# Patient Record
Sex: Male | Born: 1960 | Race: Black or African American | Hispanic: No | Marital: Married | Smoking: Never smoker
Health system: Southern US, Community
[De-identification: ages and names within clinical notes are randomized; demographics above are authoritative.]

## PROBLEM LIST (undated history)

## (undated) DIAGNOSIS — I10 Essential (primary) hypertension: Secondary | ICD-10-CM

## (undated) DIAGNOSIS — I509 Heart failure, unspecified: Secondary | ICD-10-CM

## (undated) DIAGNOSIS — I2510 Atherosclerotic heart disease of native coronary artery without angina pectoris: Secondary | ICD-10-CM

## (undated) DIAGNOSIS — M1990 Unspecified osteoarthritis, unspecified site: Secondary | ICD-10-CM

## (undated) DIAGNOSIS — E7800 Pure hypercholesterolemia, unspecified: Secondary | ICD-10-CM

## (undated) DIAGNOSIS — Z9109 Other allergy status, other than to drugs and biological substances: Secondary | ICD-10-CM

---

## 2005-10-02 ENCOUNTER — Ambulatory Visit: Payer: Self-pay

## 2005-10-24 ENCOUNTER — Ambulatory Visit: Payer: Self-pay | Admitting: Cardiology

## 2012-01-22 ENCOUNTER — Ambulatory Visit (INDEPENDENT_AMBULATORY_CARE_PROVIDER_SITE_OTHER): Payer: BC Managed Care – PPO | Admitting: Emergency Medicine

## 2012-01-22 VITALS — BP 120/80 | HR 70 | Temp 99.2°F | Resp 18 | Ht 65.0 in | Wt 182.0 lb

## 2012-01-22 DIAGNOSIS — J111 Influenza due to unidentified influenza virus with other respiratory manifestations: Secondary | ICD-10-CM

## 2012-01-22 DIAGNOSIS — J029 Acute pharyngitis, unspecified: Secondary | ICD-10-CM

## 2012-01-22 DIAGNOSIS — J101 Influenza due to other identified influenza virus with other respiratory manifestations: Secondary | ICD-10-CM

## 2012-01-22 DIAGNOSIS — R05 Cough: Secondary | ICD-10-CM

## 2012-01-22 LAB — POCT INFLUENZA A/B: Influenza B, POC: POSITIVE

## 2012-01-22 LAB — POCT RAPID STREP A (OFFICE): Rapid Strep A Screen: NEGATIVE

## 2012-01-22 MED ORDER — OSELTAMIVIR PHOSPHATE 75 MG PO CAPS: 75.0000 mg | ORAL_CAPSULE | Freq: Two times a day (BID) | ORAL | Status: AC

## 2012-01-22 NOTE — Patient Instructions (Signed)
Influenza, Adult  Influenza (flu) is an infection caused by a germ. It starts suddenly, usually with a fever. It causes chills, dry and hacking cough, headache, body aches, and sore throat. Influenza spreads easily from one person to another.    HOME CARE     Only take medicines as told by your doctor.    Rest.    Drink enough fluids to keep your pee (urine) clear or pale yellow.    Wash your hands often. Do this after you blow your nose, after you go to the bathroom, and before you touch food.   GET HELP RIGHT AWAY IF:     You have shortness of breath while resting.    You have pain or pressure in the chest or belly (abdomen).    You suddenly feel dizzy.    You feel confused.    You have a hard time breathing.    Your skin or nails turn bluish in color.    You get a bad neck pain or stiffness.    You get a bad headache, face pain, or earache.    You throw up (vomit) a lot and often.    You have a fever.   MAKE SURE YOU:     Understand these instructions.    Will watch your condition.    Will get help right away if you are not doing well or get worse.   Document Released: 08/08/2008 Document Revised: 10/19/2011 Document Reviewed: 08/08/2008  [employer] Patient Information 2012 [employer].

## 2012-01-22 NOTE — Progress Notes (Signed)
  Subjective:    Patient ID: Nicholas Torres, male    DOB: 06-17-1961, 51 y.o.   MRN: [id number]  HPI patient presents with onset of late Friday of headache fever sore throat and myalgias. Patient has had a significant fever. He's had significant aching in his joints    Review of Systems she otherwise is in good health he has no specific ongoing medical problems.     Objective:   Physical Exam  Constitutional: He appears well-nourished.  HENT:  Right Ear: External ear normal.  Left Ear: External ear normal.  Eyes: Pupils are equal, round, and reactive to light.  Neck: No JVD present. No tracheal deviation present. No thyromegaly present.  Cardiovascular: Normal rate, regular rhythm, normal heart sounds and intact distal pulses.  Exam reveals no gallop and no friction rub.   No murmur heard. Pulmonary/Chest: Effort normal and breath sounds normal. No respiratory distress. He has no wheezes. He has no rales. He exhibits no tenderness.  Abdominal: Soft. There is no tenderness.  Lymphadenopathy:    He has no cervical adenopathy.          Assessment & Plan:    Patient presents with fever myalgias consistent with flu would go ahead and check strep test and flu test.

## 2012-03-10 ENCOUNTER — Ambulatory Visit: Payer: BC Managed Care – PPO

## 2012-03-10 ENCOUNTER — Ambulatory Visit (INDEPENDENT_AMBULATORY_CARE_PROVIDER_SITE_OTHER): Payer: BC Managed Care – PPO | Admitting: Family Medicine

## 2012-03-10 VITALS — BP 133/89 | HR 66 | Temp 98.3°F | Resp 18 | Ht 65.0 in | Wt 182.0 lb

## 2012-03-10 DIAGNOSIS — M25569 Pain in unspecified knee: Secondary | ICD-10-CM

## 2012-03-10 MED ORDER — MELOXICAM 7.5 MG PO TABS: 7.5000 mg | ORAL_TABLET | Freq: Every day | ORAL | Status: AC

## 2012-03-10 NOTE — Progress Notes (Signed)
  Patient Name: Nicholas Torres Date of Birth: Apr 21, 1961 Medical Record Number: [id number] Gender: male Date of Encounter: 03/10/2012  History of Present Illness:  Nicholas Torres is a 51 y.o. very pleasant male patient who presents with the following:  Notes that sometimes he will have pain and swelling on his medial right knee.  It occurs sporadically- he has noticed this for a couple of years. The symptoms will usually last for a week or 10 days.  May occur every 3 or 4 months No known injury. The knee does not give out or pop, no instability.  Can hurt to the point where he has to limp Left knee is ok.  His right knee is maybe a little bit sore right now but not really  There is no problem list on file for this patient.  No past medical history on file. No past surgical history on file. History  Substance Use Topics  . Smoking status: Never Smoker   . Smokeless tobacco: Not on file  . Alcohol Use: Not on file   No family history on file. No Known Allergies  Medication list has been reviewed and updated.  Review of Systems: As per HPI- otherwise negative.   Physical Examination: Filed Vitals:   03/10/12 1718  BP: 133/89  Pulse: 66  Temp: 98.3 F (36.8 C)  TempSrc: Oral  Resp: 18  Height: 5\' 5"  (1.651 m)  Weight: 182 lb (82.555 kg)    Body mass index is 30.29 kg/(m^2).   GEN: WDWN, NAD, Non-toxic, Alert & Oriented x 3 HEENT: Atraumatic, Normocephalic.  Ears and Nose: No external deformity. EXTR: No clubbing/cyanosis/edema NEURO: Normal gait. No limp PSYCH: Normally interactive. Conversant. Not depressed or anxious appearing.  Calm demeanor.  Right knee: normal exam, no instability. No crepitus or effusion.  Notes maybe minimal ttp at medial joint line  [facility] reading (PRIMARY) by  Dr. Patsy Lager.  Right knee: Negative for acute fracture.  Evidence of osgood- schlatter, mild degenerative change   Assessment and Plan: 1. Knee pain  DG Knee Complete 4 Views Right,  meloxicam (MOBIC) 7.5 MG tablet   Knee pain which is intermittent and not present currently.  Offered to send him to ortho for further evaluation- however he prefers to wait and be seen when his symptoms are flared up.  Gave CD of his xrays, asked him to call if we can be of any further assistance or if he gets worse

## 2012-10-04 ENCOUNTER — Telehealth: Payer: Self-pay | Admitting: Physician Assistant

## 2012-10-04 ENCOUNTER — Ambulatory Visit (INDEPENDENT_AMBULATORY_CARE_PROVIDER_SITE_OTHER): Payer: BC Managed Care – PPO | Admitting: Physician Assistant

## 2012-10-04 VITALS — BP 112/78 | HR 93 | Temp 99.3°F | Resp 16 | Ht 65.5 in | Wt 179.0 lb

## 2012-10-04 DIAGNOSIS — R05 Cough: Secondary | ICD-10-CM

## 2012-10-04 DIAGNOSIS — J069 Acute upper respiratory infection, unspecified: Secondary | ICD-10-CM

## 2012-10-04 DIAGNOSIS — J029 Acute pharyngitis, unspecified: Secondary | ICD-10-CM

## 2012-10-04 LAB — POCT CBC
Granulocyte percent: 65 %G (ref 37–80)
HCT, POC: 48.7 % (ref 43.5–53.7)
MCV: 97.4 fL — AB (ref 80–97)
Platelet Count, POC: 178 10*3/uL (ref 142–424)
RBC: 5 M/uL (ref 4.69–6.13)

## 2012-10-04 LAB — POCT INFLUENZA A/B
Influenza A, POC: NEGATIVE
Influenza B, POC: NEGATIVE

## 2012-10-04 MED ORDER — BENZONATATE 100 MG PO CAPS: 100.0000 mg | ORAL_CAPSULE | Freq: Three times a day (TID) | ORAL | Status: DC | PRN

## 2012-10-04 MED ORDER — FIRST-DUKES MOUTHWASH MT SUSP: 10.0000 mL | OROMUCOSAL | Status: DC | PRN

## 2012-10-04 MED ORDER — IPRATROPIUM BROMIDE 0.03 % NA SOLN: 2.0000 | Freq: Two times a day (BID) | NASAL | Status: DC

## 2012-10-04 MED ORDER — HYDROCODONE-HOMATROPINE 5-1.5 MG/5ML PO SYRP: 5.0000 mL | ORAL_SOLUTION | Freq: Three times a day (TID) | ORAL | Status: DC | PRN

## 2012-10-04 NOTE — Telephone Encounter (Signed)
There is a note at the TL desk that the pt did not get magic mouthwash.  Can you please call and clarify with pt.  Did he not want the mouthwash, was it too expensive, was there a problem with the prescription?  Thank you!

## 2012-10-04 NOTE — Progress Notes (Signed)
Subjective:    Patient ID: Ernie Hew, male    DOB: October 20, 1961, 51 y.o.   MRN: [id number]  HPI   Mr. Kerlin is a 51 yr old male here with sore throat that started "a couple days ago".  Also states he has headache, cough, and post-nasal drainage.  He denies sinus pressure, tooth pain, or ear pain.  No GI symptoms.  He does not know if he has had fever.  Denies chills.  No GI symptoms.  No sick contacts that he is aware of.  Has been using some OTC cold and flu medicine.  States this is not helping.  Very concerned that his symptoms will progress because he's "felt like this before", but he's unable to elaborate on what is most concerning to him about his symptoms.  Currently the sore throat is the predominant symptom.    Has not had the flu shot because he states it gives you the flu.  He did test positive for influenza b earlier this year.    Temp is 99.50F currently; last dose of cold/flu medicine was this morning.     Review of Systems  Constitutional: Negative for fever and chills.  HENT: Positive for sore throat. Negative for ear pain, congestion, rhinorrhea, sneezing, drooling, trouble swallowing, postnasal drip and sinus pressure.   Respiratory: Positive for cough. Negative for shortness of breath and wheezing.   Cardiovascular: Negative.   Gastrointestinal: Negative.   Musculoskeletal: Negative.   Skin: Negative.   Neurological: Positive for headaches.       Objective:   Physical Exam  Vitals reviewed. Constitutional: He is oriented to person, place, and time. He appears well-developed and well-nourished. No distress.  HENT:  Head: Normocephalic and atraumatic.  Right Ear: Tympanic membrane and ear canal normal.  Left Ear: Tympanic membrane and ear canal normal.  Nose: Nose normal. Right sinus exhibits no maxillary sinus tenderness and no frontal sinus tenderness. Left sinus exhibits no maxillary sinus tenderness and no frontal sinus tenderness.  Mouth/Throat: Uvula is  midline and mucous membranes are normal. No oropharyngeal exudate, posterior oropharyngeal edema, posterior oropharyngeal erythema or tonsillar abscesses.       Post-nasal drainage  Neck: Neck supple.  Cardiovascular: Normal rate, regular rhythm, normal heart sounds and intact distal pulses.  Exam reveals no gallop and no friction rub.   No murmur heard. Pulmonary/Chest: Effort normal and breath sounds normal. He has no wheezes. He has no rales.  Lymphadenopathy:    He has no cervical adenopathy.  Neurological: He is alert and oriented to person, place, and time.  Skin: Skin is warm and dry.  Psychiatric: He has a normal mood and affect. His behavior is normal.      Filed Vitals:   10/04/12 1028  BP: 112/78  Pulse: 93  Temp: 99.3 F (37.4 C)  Resp: 16       Results for orders placed in visit on 10/04/12  POCT CBC      Component Value Range   WBC 9.5  4.6 - 10.2 K/uL   Lymph, poc 2.8  0.6 - 3.4   POC LYMPH PERCENT 29.2  10 - 50 %L   MID (cbc) 0.6  0 - 0.9   POC MID % 5.8  0 - 12 %M   POC Granulocyte 6.2  2 - 6.9   Granulocyte percent 65.0  37 - 80 %G   RBC 5.00  4.69 - 6.13 M/uL   Hemoglobin 14.5  14.1 - 18.1 g/dL  HCT, POC 48.7  43.5 - 53.7 %   MCV 97.4 (*) 80 - 97 fL   MCH, POC 29.0  27 - 31.2 pg   MCHC 29.8 (*) 31.8 - 35.4 g/dL   RDW, POC 16.1     Platelet Count, POC 178  142 - 424 K/uL   MPV 9.8  0 - 99.8 fL  POCT INFLUENZA A/B      Component Value Range   Influenza A, POC Negative     Influenza B, POC Negative          Assessment & Plan:   1. Sore throat  POCT CBC, POCT Influenza A/B, Diphenhyd-Hydrocort-Nystatin (FIRST-DUKES MOUTHWASH) SUSP  2. Cough  POCT CBC, POCT Influenza A/B, benzonatate (TESSALON) 100 MG capsule, HYDROcodone-homatropine (HYCODAN) 5-1.5 MG/5ML syrup  3. URI (upper respiratory infection)  ipratropium (ATROVENT) 0.03 % nasal spray     Mr. Abebe is a 51 yr old male here with sore throat, cough, post-nasal drainage, and headache.   Likely viral URI.  CBC is normal and rapid flu is negative.  There is no cervical adenopathy or tonsillar exudates.  Discussed with pt that I do not think he needs an antibiotic medication.  Will treat with Atrovent nasal spray, Tessalon, Hycodan, and magic mouthwash.  Encouraged plenty of fluids and rest.  Discussed RTC precautions.  Pt is in agreement with this plan and will call or come back in if worsening or not improving.

## 2012-10-04 NOTE — Patient Instructions (Addendum)
Begin using Atrovent nasal spray twice daily for relief of post-nasal drainage.  Use the magic mouthwash as frequently as every 2 hours for sore throat.  You may also use ibuprofen for sore throat as needed.  Stop taking the cold/flu medicine.  Use Tessalon for cough during the day and Hycodan for cough at night.  Hycodan will make you sleepy so don't use it during the day.  Drink plenty of fluids and get plenty of rest.  If you are worsening (running a fever, worsening cough, or new symptoms develop), or if you are not improving, call or come back in.     Upper Respiratory Infection, Adult An upper respiratory infection (URI) is also sometimes known as the common cold. The upper respiratory tract includes the nose, sinuses, throat, trachea, and bronchi. Bronchi are the airways leading to the lungs. Most people improve within 1 week, but symptoms can last up to 2 weeks. A residual cough may last even longer.  CAUSES Many different viruses can infect the tissues lining the upper respiratory tract. The tissues become irritated and inflamed and often become very moist. Mucus production is also common. A cold is contagious. You can easily spread the virus to others by oral contact. This includes kissing, sharing a glass, coughing, or sneezing. Touching your mouth or nose and then touching a surface, which is then touched by another person, can also spread the virus. SYMPTOMS  Symptoms typically develop 1 to 3 days after you come in contact with a cold virus. Symptoms vary from person to person. They may include:  Runny nose.  Sneezing.  Nasal congestion.  Sinus irritation.  Sore throat.  Loss of voice (laryngitis).  Cough.  Fatigue.  Muscle aches.  Loss of appetite.  Headache.  Low-grade fever. DIAGNOSIS  You might diagnose your own cold based on familiar symptoms, since most people get a cold 2 to 3 times a year. Your caregiver can confirm this based on your exam. Most importantly,  your caregiver can check that your symptoms are not due to another disease such as strep throat, sinusitis, pneumonia, asthma, or epiglottitis. Blood tests, throat tests, and X-rays are not necessary to diagnose a common cold, but they may sometimes be helpful in excluding other more serious diseases. Your caregiver will decide if any further tests are required. RISKS AND COMPLICATIONS  You may be at risk for a more severe case of the common cold if you smoke cigarettes, have chronic heart disease (such as heart failure) or lung disease (such as asthma), or if you have a weakened immune system. The very young and very old are also at risk for more serious infections. Bacterial sinusitis, middle ear infections, and bacterial pneumonia can complicate the common cold. The common cold can worsen asthma and chronic obstructive pulmonary disease (COPD). Sometimes, these complications can require emergency medical care and may be life-threatening. PREVENTION  The best way to protect against getting a cold is to practice good hygiene. Avoid oral or hand contact with people with cold symptoms. Wash your hands often if contact occurs. There is no clear evidence that vitamin C, vitamin E, echinacea, or exercise reduces the chance of developing a cold. However, it is always recommended to get plenty of rest and practice good nutrition. TREATMENT  Treatment is directed at relieving symptoms. There is no cure. Antibiotics are not effective, because the infection is caused by a virus, not by bacteria. Treatment may include:  Increased fluid intake. Sports drinks offer valuable electrolytes,  sugars, and fluids.  Breathing heated mist or steam (vaporizer or shower).  Eating chicken soup or other clear broths, and maintaining good nutrition.  Getting plenty of rest.  Using gargles or lozenges for comfort.  Controlling fevers with ibuprofen or acetaminophen as directed by your caregiver.  Increasing usage of your  inhaler if you have asthma. Zinc gel and zinc lozenges, taken in the first 24 hours of the common cold, can shorten the duration and lessen the severity of symptoms. Pain medicines may help with fever, muscle aches, and throat pain. A variety of non-prescription medicines are available to treat congestion and runny nose. Your caregiver can make recommendations and may suggest nasal or lung inhalers for other symptoms.  HOME CARE INSTRUCTIONS   Only take over-the-counter or prescription medicines for pain, discomfort, or fever as directed by your caregiver.  Use a warm mist humidifier or inhale steam from a shower to increase air moisture. This may keep secretions moist and make it easier to breathe.  Drink enough water and fluids to keep your urine clear or pale yellow.  Rest as needed.  Return to work when your temperature has returned to normal or as your caregiver advises. You may need to stay home longer to avoid infecting others. You can also use a face mask and careful hand washing to prevent spread of the virus. SEEK MEDICAL CARE IF:   After the first few days, you feel you are getting worse rather than better.  You need your caregiver's advice about medicines to control symptoms.  You develop chills, worsening shortness of breath, or brown or red sputum. These may be signs of pneumonia.  You develop yellow or brown nasal discharge or pain in the face, especially when you bend forward. These may be signs of sinusitis.  You develop a fever, swollen neck glands, pain with swallowing, or white areas in the back of your throat. These may be signs of strep throat. SEEK IMMEDIATE MEDICAL CARE IF:   You have a fever.  You develop severe or persistent headache, ear pain, sinus pain, or chest pain.  You develop wheezing, a prolonged cough, cough up blood, or have a change in your usual mucus (if you have chronic lung disease).  You develop sore muscles or a stiff neck. Document  Released: 04/25/2001 Document Revised: 01/22/2012 Document Reviewed: 03/03/2011 [facility] Patient Information 2013 [place], [place].

## 2012-10-05 ENCOUNTER — Telehealth: Payer: Self-pay | Admitting: Radiology

## 2012-10-07 NOTE — Telephone Encounter (Signed)
Patient came in and was given Rx for the dukes to take to pharmacy, since it did not go through.

## 2013-03-19 ENCOUNTER — Ambulatory Visit (INDEPENDENT_AMBULATORY_CARE_PROVIDER_SITE_OTHER): Payer: BC Managed Care – PPO | Admitting: Family Medicine

## 2013-03-19 VITALS — BP 128/80 | HR 65 | Temp 98.0°F | Resp 16 | Ht 66.0 in | Wt 175.0 lb

## 2013-03-19 DIAGNOSIS — L03119 Cellulitis of unspecified part of limb: Secondary | ICD-10-CM

## 2013-03-19 DIAGNOSIS — L02619 Cutaneous abscess of unspecified foot: Secondary | ICD-10-CM

## 2013-03-19 DIAGNOSIS — B353 Tinea pedis: Secondary | ICD-10-CM

## 2013-03-19 MED ORDER — DOXYCYCLINE HYCLATE 100 MG PO CAPS: 100.0000 mg | ORAL_CAPSULE | Freq: Two times a day (BID) | ORAL | Status: DC

## 2013-03-19 MED ORDER — TERBINAFINE HCL 250 MG PO TABS: 250.0000 mg | ORAL_TABLET | Freq: Every day | ORAL | Status: DC

## 2013-03-19 NOTE — Progress Notes (Signed)
[facility] [address] [place] [address] [phone]  Date:  03/19/2013   Name:  Nicholas Torres   DOB:  October 25, 1961   MRN:  [id number]  PCP:  No PCP Per Patient    Chief Complaint: Nail Problem   History of Present Illness:  Nicholas Torres is a 52 y.o. very pleasant male patient who presents with the following:  He has a history of athlete's foot.  For the last week or so this has gotten worse.  Then over the last couple of days his right foot has been painful- the 2nd toe of the right foot is red and swollen.  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  He has been taking some ibuprofen. Otherwise he is generally healthy.  No chronic medical problems or medications.    There are no active problems to display for this patient.   Past Medical History  Diagnosis Date  . Allergy     No past surgical history on file.  History  Substance Use Topics  . Smoking status: Never Smoker   . Smokeless tobacco: Not on file  . Alcohol Use: No    No family history on file.  No Known Allergies  Medication list has been reviewed and updated.  Current Outpatient Prescriptions on File Prior to Visit  Medication Sig Dispense Refill  . benzonatate (TESSALON) 100 MG capsule Take 1-2 capsules (100-200 mg total) by mouth 3 (three) times daily as needed for cough.  40 capsule  0  . Diphenhyd-Hydrocort-Nystatin (FIRST-DUKES MOUTHWASH) SUSP Take 10 mLs by mouth every 2 (two) hours as needed. With 1:1 ratio viscous lidocaine  360 mL  0  . HYDROcodone-homatropine (HYCODAN) 5-1.5 MG/5ML syrup Take 5 mLs by mouth every 8 (eight) hours as needed for cough.  120 mL  0  . ipratropium (ATROVENT) 0.03 % nasal spray Place 2 sprays into the nose 2 (two) times daily.  30 mL  5   No current facility-administered medications on file prior to visit.    Review of Systems:  As per HPI- otherwise negative. He does not have any systemic symptoms  such as fever, chills or malaise  Physical Examination: Filed Vitals:   03/19/13 1015  BP: 128/80  Pulse: 65  Temp: 98 F (36.7 C)  Resp: 16   Filed Vitals:   03/19/13 1015  Height: 5\' 6"  (1.676 m)  Weight: 175 lb (79.379 kg)   Body mass index is 28.26 kg/(m^2). Ideal Body Weight: Weight in (lb) to have BMI = 25: 154.6  GEN: WDWN, NAD, Non-toxic, A & O x 3 HEENT: Atraumatic, Normocephalic. Neck supple. No masses, No LAD. Ears and Nose: No external deformity. CV: RRR, No M/G/R. No JVD. No thrill. No extra heart sounds. PULM: CTA B, no wheezes, crackles, rhonchi. No retractions. No resp. distress. No accessory muscle use. ABD: S, NT, ND, +BS. No rebound. No HSM. EXTR: No c/c/e NEURO Normal gait.  PSYCH: Normally interactive. Conversant. Not depressed or anxious appearing.  Calm demeanor.  Feet: in between the toes of the left and right feet he has moist, white skin changes consistent with tinea pedis. The right foot shows mild swelling, warmth and tenderness near the toes.  The right 2nd toe is swollen and tender.  Normal DP pulse, cap refill and sensation.   Let him know that a CMP is recommended prior to starting lamisil but he declined.  Assessment and Plan: Cellulitis of foot - Plan: doxycycline (VIBRAMYCIN) 100 MG capsule  Tinea pedis - Plan: terbinafine (LAMISIL) 250 MG tablet  Meds ordered this encounter  Medications  . terbinafine (LAMISIL) 250 MG tablet    Sig: Take 1 tablet (250 mg total) by mouth daily.    Dispense:  14 tablet    Refill:  0  . doxycycline (VIBRAMYCIN) 100 MG capsule    Sig: Take 1 capsule (100 mg total) by mouth 2 (two) times daily.    Dispense:  20 capsule    Refill:  0   Treat for severe tinea pedis and associated cellulitis with lamisil and doxycycline. Close follow- up if not better.  Discussed foot hygiene/ importance of keeping feet dry. See patient instructions for more details.     Signed Abbe Amsterdam, MD

## 2013-03-19 NOTE — Patient Instructions (Addendum)
You have a severe fungal infection of your feet (athlete's foot).  This seems to have led to a bacterial infection of your toe.  We are going to treat you with 2 medications- one for bacteria and one for fungus.  Also, be sure that you keep your feet VERY DRY!!  Use an OTC spray for athlete's foot.    If your feet are not significantly better in the next 1-2 days please come back in- Sooner if worse.   Next time, start to treat you feet for fungus at the first sign of wetness/ white material between your toes.  This will help prevent such a severe infection.

## 2013-07-10 ENCOUNTER — Ambulatory Visit (INDEPENDENT_AMBULATORY_CARE_PROVIDER_SITE_OTHER): Payer: BC Managed Care – PPO | Admitting: Internal Medicine

## 2013-07-10 ENCOUNTER — Encounter: Payer: Self-pay | Admitting: Internal Medicine

## 2013-07-10 ENCOUNTER — Ambulatory Visit: Payer: BC Managed Care – PPO

## 2013-07-10 VITALS — BP 130/90 | HR 70 | Temp 98.5°F | Resp 16 | Ht 65.25 in | Wt 180.0 lb

## 2013-07-10 DIAGNOSIS — Z8249 Family history of ischemic heart disease and other diseases of the circulatory system: Secondary | ICD-10-CM | POA: Insufficient documentation

## 2013-07-10 DIAGNOSIS — N529 Male erectile dysfunction, unspecified: Secondary | ICD-10-CM

## 2013-07-10 DIAGNOSIS — Z0000 Encounter for general adult medical examination without abnormal findings: Secondary | ICD-10-CM

## 2013-07-10 LAB — POCT UA - MICROSCOPIC ONLY
Bacteria, U Microscopic: NEGATIVE
Crystals, Ur, HPF, POC: NEGATIVE
Epithelial cells, urine per micros: NEGATIVE
RBC, urine, microscopic: NEGATIVE
WBC, Ur, HPF, POC: NEGATIVE

## 2013-07-10 LAB — POCT URINALYSIS DIPSTICK
Bilirubin, UA: NEGATIVE
Ketones, UA: NEGATIVE
Leukocytes, UA: NEGATIVE
Protein, UA: NEGATIVE
Spec Grav, UA: 1.025

## 2013-07-10 LAB — POCT CBC
Granulocyte percent: 40.9 %G (ref 37–80)
Hemoglobin: 14.7 g/dL (ref 14.1–18.1)
MCH, POC: 31.1 pg (ref 27–31.2)
MCV: 98.9 fL — AB (ref 80–97)
MPV: 10.8 fL (ref 0–99.8)
RBC: 4.73 M/uL (ref 4.69–6.13)
WBC: 4.6 10*3/uL (ref 4.6–10.2)

## 2013-07-10 MED ORDER — SILDENAFIL CITRATE 50 MG PO TABS: 50.0000 mg | ORAL_TABLET | Freq: Every day | ORAL | Status: DC | PRN

## 2013-07-10 NOTE — Patient Instructions (Addendum)
Coronary Artery Disease, Risk Factors Research has shown that the risk of developing coronary artery disease (CAD) and having a heart attack increases with each factor you have. RISK FACTORS YOU CANNOT CHANGE  Your age. Your risk goes up as you get older. Most heart attacks happen to people over the age of 24.  Gender. Men have a greater risk of heart attack than women, and they have attacks earlier in life. However, women are more likely to die from a heart attack.  Heredity. Children of parents with heart disease are more likely to develop it themselves.  Race. African Americans and other ethnic groups have a higher risk, possibly because of high blood pressure, a tendency toward obesity, and diabetes.  Your family. Most people with a strong family history of heart disease have one or more other risk factors. RISK FACTORS YOU CAN CHANGE  Exposure to tobacco smoke. Even secondhand smoke greatly increases the risk for heart disease.  High blood cholesterol may be lowered with changes in diet, activity, and medicines.  High blood pressure makes the heart work harder. This causes the heart muscles to become thick and, eventually, weaker. It also increases your risk of stroke, heart attack, and kidney or heart failure.  Physical inactivity is a risk factor for CAD. Regular physical activity helps prevent heart and blood vessel disease. Exercise helps control blood cholesterol, diabetes, obesity, and it may help lower blood pressure in some people.  Excess body fat, especially belly fat, increases the risk of heart disease and stroke even if there are no other risk factors. Excess weight increases the heart's workload and raises blood pressure and blood cholesterol.  Diabetes seriously increases your risk of developing CAD. If you have diabetes, you should work with your caregiver to manage it and control other risk factors. OTHER RISK FACTORS FOR CAD  How you respond to stress.  Drinking  too much alcohol may raise blood pressure, cause heart failure, and lead to stroke.  Total cholesterol greater than 200 milligrams.  HDL (good) cholesterol less than 40 milligrams. HDL helps keep cholesterol from building up in the walls of the arteries. PREVENTING CAD  Maintain a healthy weight.  Exercise or do physical activity.  Eat a heart-healthy diet low in fat and salt and high in fiber.  Control your blood pressure to keep it below 120 over 80.  Keep your cholesterol at a level that lowers your risk.  Manage diabetes if you have it.  Stop smoking.  Learn how to manage stress. HEART SMART SUBSTITUTIONS  Instead of whole or 2% milk and cream, use skim milk.  Instead of fried foods, eat baked, steamed, boiled, broiled, or microwaved foods.  Instead of lard, butter, palm and coconut oils, cook with unsaturated vegetable oils, such as corn, olive, canola, safflower, sesame, soybean, sunflower, or peanut.  Instead of fatty cuts of meat, eat lean cuts of meat or cut off the fatty parts.  Instead of 1 whole egg in recipes, use 2 egg whites.  Instead of sauces, butter, and salt, season vegetables with herbs and spices.  Instead of regular hard and processed cheeses, eat low-fat, low-sodium cheeses.  Instead of salted potato chips, choose low-fat, unsalted tortilla and potato chips and unsalted pretzels and popcorn.  Instead of sour cream and mayonnaise, use plain low-fat yogurt, low-fat cottage cheese, or low-fat or "light" sour cream. FOR MORE INFORMATION  National Heart Lung and Blood Institute: http://www.ball-ray.net/ American Heart Association: weddingcandid.com Document Released: 01/20/2004 Document Revised: 01/22/2012  Document Reviewed: 01/15/2008 [facility] Patient Information 2014 [place], [place]. Erectile Dysfunction Erectile dysfunction (ED) is the inability to get a good enough erection to have sexual intercourse. ED may involve:  Inability to  get an erection.  Lack of enough hardness to allow penetration.  Loss of the erection before sex is finished.  Premature ejaculation.  Any combination of these problems if they occur more than 25% of the time. CAUSES  Certain drugs, such as:  Pain relievers.  Antihistamines.  Antidepressants.  Blood pressure medicines.  Water pills.  Ulcer medicines.  Muscle relaxants.  Illegal drugs.  Excessive drinking.  Psychological causes, such as:  Anxiety.  Depression.  Sadness.  Exhaustion.  Performance fear.  Stress.  Physical causes, such as:  Artery problems. This may include diabetes, smoking, liver disease, or atherosclerosis.  High blood pressure.  Hormonal problems, such as low testosterone.  Obesity.  Nerve problems. This may include back or pelvic injuries, diabetes, multiple sclerosis, Parkinson's disease, or some surgeries. SYMPTOMS  Inability to get an erection.  Lack of enough hardness to allow penetration.  Loss of the erection before sex is finished.  Premature ejaculation.  Normal erections at some times, but with frequent unsatisfactory episodes.  Orgasms that are not satisfactory in sensation or frequency.  Low sexual satisfaction in either partner because of erection problems.  A curved penis occurring with erection. The curve may cause pain or may be too curved to allow for intercourse.  Never having nighttime erections. DIAGNOSIS Your caregiver can often diagnose this condition by:  Performing a physical exam to find other diseases or specific problems with the penis.  Asking you detailed questions about the problem.  Performing blood tests to check for diabetes or to measure hormone levels.  Performing urine tests to find other underlying health conditions.  Performing an ultrasound to check for scarring.  Performing a test to check blood flow to the penis.  Doing a sleep study at home to measure nighttime  erections. TREATMENT   You may be prescribed medicines by mouth.  You may be given medicine injections into the penis.  You may be prescribed a vacuum pump with a ring.  Penile implant surgery may be performed. You may receive:  An inflatable implant.  A semi-rigid implant.  Blood vessel surgery may be performed. HOME CARE INSTRUCTIONS  Take all medicine as directed by your caregiver. Do not take any other medicines without talking to your caregiver first.  Follow your caregiver's directions for specific treatments as prescribed.  Follow up with your caregiver as directed. Document Released: 10/27/2000 Document Revised: 01/22/2012 Document Reviewed: 02/19/2011 [facility] Patient Information 2014 [place], [place].

## 2013-07-10 NOTE — Progress Notes (Signed)
Subjective:    Patient ID: Nicholas Torres, male    DOB: 11-24-1960, 52 y.o.   MRN: [id number]  HPI Patient presents to have his prostate checked not having any problems. Wants CPE, feels good. Strong famly hx of CAD, brother with CABG, father died MI 61yo. No chest pain or fatigue, no smoke and nohx lipids or DM.   Review of Systems  Constitutional: Negative.   HENT: Negative.   Eyes: Negative.   Respiratory: Negative.   Cardiovascular: Negative.   Gastrointestinal: Negative.   Endocrine: Negative.   Genitourinary: Negative.   Musculoskeletal: Negative.   Skin: Negative.   Allergic/Immunologic: Negative.   Neurological: Negative.   Hematological: Negative.   Psychiatric/Behavioral: Negative.        Objective:   Physical Exam  Constitutional: He is oriented to person, place, and time. He appears well-developed and well-nourished.  HENT:  Right Ear: External ear normal.  Left Ear: External ear normal.  Nose: Nose normal.  Mouth/Throat: Oropharynx is clear and moist.  Eyes: Conjunctivae and EOM are normal. Pupils are equal, round, and reactive to light.  Neck: Normal range of motion. Neck supple. No tracheal deviation present.  Cardiovascular: Normal rate, regular rhythm, normal heart sounds and intact distal pulses.   Pulmonary/Chest: Effort normal and breath sounds normal.  Abdominal: Soft. Bowel sounds are normal. He exhibits no mass. There is no tenderness.  Genitourinary: Rectum normal, prostate normal and penis normal.  Musculoskeletal: Normal range of motion.  Lymphadenopathy:    He has no cervical adenopathy.  Neurological: He is alert and oriented to person, place, and time. He has normal reflexes. No cranial nerve deficit. Coordination normal.  Skin: No rash noted.  Psychiatric: He has a normal mood and affect. His behavior is normal. Judgment and thought content normal.  Carotids no bruits. [facility] reading (PRIMARY) by  Dr.Jannelle Notaro normal cxr  EKG Inferior-lateral  t wave inversions are his normal.  Had stress test 2006 for this and was normal. Results for orders placed in visit on 07/10/13  POCT CBC      Result Value Range   WBC 4.6  4.6 - 10.2 K/uL   Lymph, poc 2.3  0.6 - 3.4   POC LYMPH PERCENT 50.2 (*) 10 - 50 %L   MID (cbc) 0.4  0 - 0.9   POC MID % 8.9  0 - 12 %M   POC Granulocyte 1.9 (*) 2 - 6.9   Granulocyte percent 40.9  37 - 80 %G   RBC 4.73  4.69 - 6.13 M/uL   Hemoglobin 14.7  14.1 - 18.1 g/dL   HCT, POC 56.2  13.0 - 53.7 %   MCV 98.9 (*) 80 - 97 fL   MCH, POC 31.1  27 - 31.2 pg   MCHC 31.4 (*) 31.8 - 35.4 g/dL   RDW, POC 86.5     Platelet Count, POC 147  142 - 424 K/uL   MPV 10.8  0 - 99.8 fL  IFOBT (OCCULT BLOOD)      Result Value Range   IFOBT Positive    POCT UA - MICROSCOPIC ONLY      Result Value Range   WBC, Ur, HPF, POC neg     RBC, urine, microscopic neg     Bacteria, U Microscopic neg     Mucus, UA neg     Epithelial cells, urine per micros neg     Crystals, Ur, HPF, POC neg     Casts, Ur, LPF, POC neg  Yeast, UA neg    POCT URINALYSIS DIPSTICK      Result Value Range   Color, UA yellow     Clarity, UA clear     Glucose, UA neg     Bilirubin, UA neg     Ketones, UA neg     Spec Grav, UA 1.025     Blood, UA neg     pH, UA 6.0     Protein, UA neg     Urobilinogen, UA 0.2     Nitrite, UA neg     Leukocytes, UA Negative    GLUCOSE, POCT (MANUAL RESULT ENTRY)      Result Value Range   POC Glucose 71  70 - 99 mg/dl  POCT GLYCOSYLATED HEMOGLOBIN (HGB A1C)      Result Value Range   Hemoglobin A1C 5.5           Assessment & Plan:  Refer for colonoscopy Healthy exam/ED consistent with aging. Viagra trial

## 2013-07-10 NOTE — Progress Notes (Signed)
  Subjective:    Patient ID: Nicholas Torres, male    DOB: 17-Sep-1961, 52 y.o.   MRN: [id number]  HPI    Review of Systems     Objective:   Physical Exam        Assessment & Plan:

## 2013-07-11 LAB — COMPREHENSIVE METABOLIC PANEL
ALT: 20 U/L (ref 0–53)
Albumin: 4.7 g/dL (ref 3.5–5.2)
CO2: 30 mEq/L (ref 19–32)
Calcium: 10 mg/dL (ref 8.4–10.5)
Chloride: 102 mEq/L (ref 96–112)
Glucose, Bld: 79 mg/dL (ref 70–99)
Potassium: 4.4 mEq/L (ref 3.5–5.3)
Sodium: 139 mEq/L (ref 135–145)
Total Bilirubin: 0.3 mg/dL (ref 0.3–1.2)
Total Protein: 7.9 g/dL (ref 6.0–8.3)

## 2013-07-11 LAB — LIPID PANEL
Cholesterol: 221 mg/dL — ABNORMAL HIGH (ref 0–200)
HDL: 50 mg/dL (ref >39)
Total CHOL/HDL Ratio: 4.4 Ratio
Triglycerides: 199 mg/dL — ABNORMAL HIGH (ref <150)
VLDL: 40 mg/dL (ref 0–40)

## 2013-07-12 ENCOUNTER — Encounter: Payer: Self-pay | Admitting: Family Medicine

## 2013-09-19 ENCOUNTER — Ambulatory Visit (AMBULATORY_SURGERY_CENTER): Payer: Self-pay | Admitting: *Deleted

## 2013-09-19 VITALS — Ht 65.0 in | Wt 183.6 lb

## 2013-09-19 DIAGNOSIS — Z1211 Encounter for screening for malignant neoplasm of colon: Secondary | ICD-10-CM

## 2013-09-19 MED ORDER — NA SULFATE-K SULFATE-MG SULF 17.5-3.13-1.6 GM/177ML PO SOLN: 1.0000 | Freq: Once | ORAL | Status: DC

## 2013-09-19 NOTE — Progress Notes (Signed)
No allergies to eggs or soy. No prior surgery or anesthesia.

## 2013-09-22 ENCOUNTER — Encounter: Payer: Self-pay | Admitting: Internal Medicine

## 2013-10-03 ENCOUNTER — Encounter: Payer: Self-pay | Admitting: Internal Medicine

## 2013-10-03 ENCOUNTER — Ambulatory Visit (AMBULATORY_SURGERY_CENTER): Payer: BC Managed Care – PPO | Admitting: Internal Medicine

## 2013-10-03 VITALS — BP 119/84 | HR 55 | Temp 97.8°F | Resp 17 | Ht 65.0 in | Wt 183.0 lb

## 2013-10-03 DIAGNOSIS — Z1211 Encounter for screening for malignant neoplasm of colon: Secondary | ICD-10-CM

## 2013-10-03 LAB — HM COLONOSCOPY

## 2013-10-03 MED ORDER — SODIUM CHLORIDE 0.9 % IV SOLN: 500.0000 mL | INTRAVENOUS | Status: DC

## 2013-10-03 NOTE — Progress Notes (Signed)
Patient did not experience any of the following events: a burn prior to discharge; a fall within the facility; wrong site/side/patient/procedure/implant event; or a hospital transfer or hospital admission upon discharge from the facility. (G8907) Patient did not have preoperative order for IV antibiotic SSI prophylaxis. (G8918)  

## 2013-10-03 NOTE — Patient Instructions (Addendum)
Your colon is normal.  Next routine colonoscopy in 10 years - 2024.   It is the time of year to have a vaccination to prevent the flu (influenza virus). Please have this done through your primary care provider or you can get this done at local pharmacies or the [facility]. It would be very helpful if you notify your primary care provider when and where you had the vaccination given by messaging them in My Chart, leaving a message or faxing the information.  I appreciate the opportunity to care for you. Iva Boop, MD, [facility]   Discharge instructions given with verbal understanding. Normal exam. Resume previous medications. YOU HAD AN ENDOSCOPIC PROCEDURE TODAY AT THE [facility]: Refer to the procedure report that was given to you for any specific questions about what was found during the examination.  If the procedure report does not answer your questions, please call your gastroenterologist to clarify.  If you requested that your care partner not be given the details of your procedure findings, then the procedure report has been included in a sealed envelope for you to review at your convenience later.  YOU SHOULD EXPECT: Some feelings of bloating in the abdomen. Passage of more gas than usual.  Walking can help get rid of the air that was put into your GI tract during the procedure and reduce the bloating. If you had a lower endoscopy (such as a colonoscopy or flexible sigmoidoscopy) you may notice spotting of blood in your stool or on the toilet paper. If you underwent a bowel prep for your procedure, then you may not have a normal bowel movement for a few days.  DIET: Your first meal following the procedure should be a light meal and then it is ok to progress to your normal diet.  A half-sandwich or bowl of soup is an example of a good first meal.  Heavy or fried foods are harder to digest and may make you feel nauseous or bloated.  Likewise meals heavy in dairy and  vegetables can cause extra gas to form and this can also increase the bloating.  Drink plenty of fluids but you should avoid alcoholic beverages for 24 hours.  ACTIVITY: Your care partner should take you home directly after the procedure.  You should plan to take it easy, moving slowly for the rest of the day.  You can resume normal activity the day after the procedure however you should NOT DRIVE or use heavy machinery for 24 hours (because of the sedation medicines used during the test).    SYMPTOMS TO REPORT IMMEDIATELY: A gastroenterologist can be reached at any hour.  During normal business hours, 8:30 AM to 5:00 PM Monday through Friday, call [phone].  After hours and on weekends, please call the GI answering service at [phone] who will take a message and have the physician on call contact you.   Following lower endoscopy (colonoscopy or flexible sigmoidoscopy):  Excessive amounts of blood in the stool  Significant tenderness or worsening of abdominal pains  Swelling of the abdomen that is new, acute  Fever of 100F or higher  FOLLOW UP: If any biopsies were taken you will be contacted by phone or by letter within the next 1-3 weeks.  Call your gastroenterologist if you have not heard about the biopsies in 3 weeks.  Our staff will call the home number listed on your records the next business day following your procedure to check on you and address  any questions or concerns that you may have at that time regarding the information given to you following your procedure. This is a courtesy call and so if there is no answer at the home number and we have not heard from you through the emergency physician on call, we will assume that you have returned to your regular daily activities without incident.  SIGNATURES/CONFIDENTIALITY: You and/or your care partner have signed paperwork which will be entered into your electronic medical record.  These signatures attest to the fact that that  the information above on your After Visit Summary has been reviewed and is understood.  Full responsibility of the confidentiality of this discharge information lies with you and/or your care-partner.

## 2013-10-03 NOTE — Op Note (Signed)
[facility] [address] [place], [address]   COLONOSCOPY PROCEDURE REPORT  PATIENT: Nicholas Torres, Nicholas Torres  MR#: [id number] BIRTHDATE: [birth year] , 52  yrs. old GENDER: Male ENDOSCOPIST: Iva Boop, MD, [facility] REFERRED XB:JYNWG Guest, M.D. PROCEDURE DATE:  10/03/2013 PROCEDURE:   Colonoscopy, screening First Screening Colonoscopy - Avg.  risk and is 50 yrs.  old or older Yes.  Prior Negative Screening - Now for repeat screening. N/A  History of Adenoma - Now for follow-up colonoscopy & has been > or = to 3 yrs.  N/A  Polyps Removed Today? No.  Recommend repeat exam, <10 yrs? No. ASA CLASS:   Class I INDICATIONS:average risk screening and first colonoscopy. MEDICATIONS: propofol (Diprivan) 250mg  IV, MAC sedation, administered by CRNA, and These medications were titrated to patient response per physician's verbal order  DESCRIPTION OF PROCEDURE:   After the risks benefits and alternatives of the procedure were thoroughly explained, informed consent was obtained.  A digital rectal exam revealed no abnormalities of the rectum, A digital rectal exam revealed the prostate was not enlarged, and A digital rectal exam revealed no prostatic nodules.   The LB [id number]  endoscope was introduced through the anus and advanced to the cecum, which was identified by both the appendix and ileocecal valve. No adverse events experienced.   The quality of the prep was excellent using Suprep  The instrument was then slowly withdrawn as the colon was fully examined.      COLON FINDINGS: A normal appearing cecum, ileocecal valve, and appendiceal orifice were identified.  The ascending, hepatic flexure, transverse, splenic flexure, descending, sigmoid colon and rectum appeared unremarkable.  No polyps or cancers were seen.   A right colon retroflexion was performed.  Retroflexed views revealed no abnormalities. The time to cecum=1 minutes 17 seconds. Withdrawal time=7  minutes 22 seconds.  The scope was withdrawn and the procedure completed. COMPLICATIONS: There were no complications.  ENDOSCOPIC IMPRESSION: Normal colonoscopy - excellent prep - first colonoscopy Normal prostate  RECOMMENDATIONS: Repeat colonoscopy 10 years - 2024   eSigned:  Iva Boop, MD, [facility] 10/03/2013 11:46 AM   cc: Robert Bellow, MD and The Patient

## 2013-10-06 ENCOUNTER — Telehealth: Payer: Self-pay | Admitting: *Deleted

## 2013-10-06 NOTE — Telephone Encounter (Signed)
  Follow up Call-  Call back number 10/03/2013  Post procedure Call Back phone  # [phone]  Permission to leave phone message Yes     Patient questions:  Do you have a fever, pain , or abdominal swelling? no Pain Score  0 *  Have you tolerated food without any problems? yes  Have you been able to return to your normal activities? yes  Do you have any questions about your discharge instructions: Diet   no Medications  no Follow up visit  no  Do you have questions or concerns about your Care? no  Actions: * If pain score is 4 or above: No action needed, pain <4.

## 2014-08-09 ENCOUNTER — Ambulatory Visit (INDEPENDENT_AMBULATORY_CARE_PROVIDER_SITE_OTHER): Payer: BC Managed Care – PPO | Admitting: Internal Medicine

## 2014-08-09 VITALS — BP 138/90 | HR 70 | Temp 98.3°F | Resp 16 | Ht 65.25 in | Wt 187.4 lb

## 2014-08-09 DIAGNOSIS — S39012A Strain of muscle, fascia and tendon of lower back, initial encounter: Secondary | ICD-10-CM

## 2014-08-09 DIAGNOSIS — S335XXA Sprain of ligaments of lumbar spine, initial encounter: Secondary | ICD-10-CM

## 2014-08-09 MED ORDER — MELOXICAM 15 MG PO TABS: 15.0000 mg | ORAL_TABLET | Freq: Every day | ORAL | Status: DC

## 2014-08-09 NOTE — Progress Notes (Signed)
Subjective:  This chart was scribed for Tami Lin, MD by Mercy Moore, Medial Scribe. This patient was seen in room 10 and the patient's care was started at 8:32 AM.  Chief Complaint  Patient presents with  . Back Pain    Pain started in the lower back and radiates down the back of both legs. Pt. states he feels it when standing up, not when sitting down. x2 weeks     Patient ID: Nicholas Torres, male    DOB: 1961-03-12, 53 y.o.   MRN: [id number]  HPI HPI Comments: Nicholas Torres is a 53 y.o. male without history of previous back pain who presents to the [facility] complaining of lower back pain, ongoing for 1.5 weeks. Patient initially locates pain at lower left hip. He now reports exacerbated pain with standing and twisting motions and reports radiation described as tingling extending into his thighs bilaterally. Patient reports alleviation with sitting and lying down. Patient reports mild stiffness and exacerbation standing from seated positions. Patient reports treatment with OTC medications with temporary relief. Patient denies numbness or weakness. Patient denies recently working out, but states that he works for [employer].   Patient Active Problem List   Diagnosis Date Noted  . Family history of ischemic heart disease (IHD) 07/10/2013   Past Medical History  Diagnosis Date  . Allergy    Past Surgical History  Procedure Laterality Date  . No prior surgery     No Known Allergies Prior to Admission medications   Medication Sig Start Date End Date Taking? Authorizing Provider  sildenafil (VIAGRA) 50 MG tablet Take 1 tablet (50 mg total) by mouth daily as needed for erectile dysfunction. 07/10/13   Orma Flaming, MD   History   Social History  . Marital Status: Single    Spouse Name: N/A    Number of Children: N/A  . Years of Education: N/A   Occupational History  . Not on file.   Social History Main Topics  . Smoking status: Never Smoker   .  Smokeless tobacco: Never Used  . Alcohol Use: No  . Drug Use: No  . Sexual Activity: Not on file   Other Topics Concern  . Not on file   Social History Narrative  . No narrative on file    Review of Systems  Constitutional: Negative for fever and chills.  Musculoskeletal: Positive for back pain. Negative for gait problem.  Neurological: Negative for weakness and numbness.       Objective:   Physical Exam  Nursing note and vitals reviewed. Constitutional: He is oriented to person, place, and time. He appears well-developed and well-nourished. No distress.  HENT:  Head: Normocephalic and atraumatic.  Eyes: EOM are normal.  Neck: Neck supple.  Cardiovascular: Normal rate.   Pulmonary/Chest: Effort normal. No respiratory distress.  Musculoskeletal: Normal range of motion. He exhibits tenderness.  Tender just above the left iliac crest but completely normal ROM of the back without pain. Negative straight leg raise to 90 deg bilaterally.  No sensory or motor losses.   Neurological: He is alert and oriented to person, place, and time. He has normal reflexes.  Skin: Skin is warm and dry.  Psychiatric: He has a normal mood and affect. His behavior is normal.     Filed Vitals:   08/09/14 0821  BP: 138/90  Pulse: 70  Temp: 98.3 F (36.8 C)  TempSrc: Oral  Resp: 16  Height: 5' 5.25" (1.657 m)  Weight:  187 lb 6.4 oz (85.004 kg)  SpO2: 98%        Assessment & Plan:  Strain of lumbar paraspinal muscle, initial encounter  Meds ordered this encounter  Medications  . meloxicam (MOBIC) 15 MG tablet    Sig: Take 1 tablet (15 mg total) by mouth daily.    Dispense:  30 tablet    Refill:  0   Stretching exer daily F/u 1 mo not well  I personally performed the services described in this documentation, which was scribed in my presence. The recorded information has been reviewed and is accurate.

## 2015-11-14 DIAGNOSIS — I2510 Atherosclerotic heart disease of native coronary artery without angina pectoris: Secondary | ICD-10-CM

## 2015-11-14 DIAGNOSIS — I219 Acute myocardial infarction, unspecified: Secondary | ICD-10-CM

## 2015-11-14 HISTORY — DX: Acute myocardial infarction, unspecified: I21.9

## 2015-11-14 HISTORY — DX: Atherosclerotic heart disease of native coronary artery without angina pectoris: I25.10

## 2016-07-23 ENCOUNTER — Encounter: Payer: Self-pay | Admitting: *Deleted

## 2016-07-23 ENCOUNTER — Emergency Department: Payer: BLUE CROSS/BLUE SHIELD

## 2016-07-23 ENCOUNTER — Inpatient Hospital Stay
Admission: EM | Admit: 2016-07-23 | Discharge: 2016-07-24 | DRG: 282 | Disposition: A | Discharge status: Other Acute Inpatient Hospital | Payer: BLUE CROSS/BLUE SHIELD | Attending: Specialist | Admitting: Specialist

## 2016-07-23 DIAGNOSIS — I2582 Chronic total occlusion of coronary artery: Secondary | ICD-10-CM | POA: Diagnosis present

## 2016-07-23 DIAGNOSIS — Z8249 Family history of ischemic heart disease and other diseases of the circulatory system: Secondary | ICD-10-CM | POA: Diagnosis not present

## 2016-07-23 DIAGNOSIS — D696 Thrombocytopenia, unspecified: Secondary | ICD-10-CM | POA: Diagnosis present

## 2016-07-23 DIAGNOSIS — I10 Essential (primary) hypertension: Secondary | ICD-10-CM | POA: Diagnosis present

## 2016-07-23 DIAGNOSIS — Z79899 Other long term (current) drug therapy: Secondary | ICD-10-CM | POA: Diagnosis not present

## 2016-07-23 DIAGNOSIS — I214 Non-ST elevation (NSTEMI) myocardial infarction: Secondary | ICD-10-CM | POA: Diagnosis present

## 2016-07-23 DIAGNOSIS — I2510 Atherosclerotic heart disease of native coronary artery without angina pectoris: Secondary | ICD-10-CM | POA: Diagnosis present

## 2016-07-23 DIAGNOSIS — Z833 Family history of diabetes mellitus: Secondary | ICD-10-CM

## 2016-07-23 DIAGNOSIS — E785 Hyperlipidemia, unspecified: Secondary | ICD-10-CM | POA: Diagnosis present

## 2016-07-23 DIAGNOSIS — R079 Chest pain, unspecified: Secondary | ICD-10-CM

## 2016-07-23 HISTORY — DX: Essential (primary) hypertension: I10

## 2016-07-23 HISTORY — DX: Pure hypercholesterolemia, unspecified: E78.00

## 2016-07-23 LAB — CBC WITH DIFFERENTIAL/PLATELET
BASOS ABS: 0 10*3/uL (ref 0–0.1)
Basophils Relative: 0 %
EOS ABS: 0.1 10*3/uL (ref 0–0.7)
EOS PCT: 2 %
HCT: 44.2 % (ref 40.0–52.0)
HEMOGLOBIN: 14.9 g/dL (ref 13.0–18.0)
LYMPHS ABS: 2.9 10*3/uL (ref 1.0–3.6)
Lymphocytes Relative: 52 %
MCH: 31.3 pg (ref 26.0–34.0)
MCHC: 33.8 g/dL (ref 32.0–36.0)
MCV: 92.6 fL (ref 80.0–100.0)
Monocytes Absolute: 0.6 10*3/uL (ref 0.2–1.0)
Monocytes Relative: 12 %
NEUTROS PCT: 34 %
Neutro Abs: 1.9 10*3/uL (ref 1.4–6.5)
PLATELETS: 133 10*3/uL — AB (ref 150–440)
RBC: 4.77 MIL/uL (ref 4.40–5.90)
RDW: 12.9 % (ref 11.5–14.5)
WBC: 5.5 10*3/uL (ref 3.8–10.6)

## 2016-07-23 LAB — COMPREHENSIVE METABOLIC PANEL
ALBUMIN: 4.4 g/dL (ref 3.5–5.0)
ALK PHOS: 78 U/L (ref 38–126)
ALT: 24 U/L (ref 17–63)
AST: 32 U/L (ref 15–41)
Anion gap: 7 (ref 5–15)
BUN: 16 mg/dL (ref 6–20)
CALCIUM: 9.3 mg/dL (ref 8.9–10.3)
CHLORIDE: 105 mmol/L (ref 101–111)
CO2: 25 mmol/L (ref 22–32)
CREATININE: 1.06 mg/dL (ref 0.61–1.24)
GFR calc Af Amer: >60 mL/min (ref >60)
GFR calc non Af Amer: >60 mL/min (ref >60)
GLUCOSE: 97 mg/dL (ref 65–99)
Potassium: 3.8 mmol/L (ref 3.5–5.1)
SODIUM: 137 mmol/L (ref 135–145)
Total Bilirubin: 0.6 mg/dL (ref 0.3–1.2)
Total Protein: 8.4 g/dL — ABNORMAL HIGH (ref 6.5–8.1)

## 2016-07-23 LAB — TROPONIN I
TROPONIN I: 1.22 ng/mL — AB (ref <0.03)
Troponin I: 0.05 ng/mL (ref <0.03)
Troponin I: 5.27 ng/mL (ref <0.03)

## 2016-07-23 LAB — PROTIME-INR
INR: 1.11
PROTHROMBIN TIME: 14.4 s (ref 11.4–15.2)

## 2016-07-23 LAB — APTT: aPTT: 31 seconds (ref 24–36)

## 2016-07-23 MED ORDER — SODIUM CHLORIDE 0.9 % WEIGHT BASED INFUSION
3.0000 mL/kg/h | INTRAVENOUS | Status: AC
  Administered 2016-07-24: 3 mL/kg/h via INTRAVENOUS

## 2016-07-23 MED ORDER — SODIUM CHLORIDE 0.9% FLUSH
3.0000 mL | Freq: Two times a day (BID) | INTRAVENOUS | Status: DC
  Administered 2016-07-23 (×2): 3 mL via INTRAVENOUS

## 2016-07-23 MED ORDER — SODIUM CHLORIDE 0.9 % WEIGHT BASED INFUSION
1.0000 mL/kg/h | INTRAVENOUS | Status: DC
  Administered 2016-07-24: 1 mL/kg/h via INTRAVENOUS

## 2016-07-23 MED ORDER — ASPIRIN 81 MG PO CHEW: 324.0000 mg | CHEWABLE_TABLET | ORAL | Status: DC

## 2016-07-23 MED ORDER — NITROGLYCERIN 0.4 MG SL SUBL: 0.4000 mg | SUBLINGUAL_TABLET | SUBLINGUAL | Status: DC | PRN

## 2016-07-23 MED ORDER — ONDANSETRON HCL 4 MG/2ML IJ SOLN: 4.0000 mg | Freq: Four times a day (QID) | INTRAMUSCULAR | Status: DC | PRN

## 2016-07-23 MED ORDER — ASPIRIN 300 MG RE SUPP
300.0000 mg | RECTAL | Status: DC
  Filled 2016-07-23: qty 1

## 2016-07-23 MED ORDER — ATORVASTATIN CALCIUM 20 MG PO TABS
40.0000 mg | ORAL_TABLET | Freq: Every day | ORAL | Status: DC
  Administered 2016-07-23: 40 mg via ORAL
  Filled 2016-07-23: qty 2

## 2016-07-23 MED ORDER — ENOXAPARIN SODIUM 100 MG/ML ~~LOC~~ SOLN
1.0000 mg/kg | Freq: Two times a day (BID) | SUBCUTANEOUS | Status: DC
  Administered 2016-07-23: 85 mg via SUBCUTANEOUS
  Administered 2016-07-23: 100 mg via SUBCUTANEOUS
  Filled 2016-07-23 (×2): qty 1

## 2016-07-23 MED ORDER — SODIUM CHLORIDE 0.9 % IV SOLN: 250.0000 mL | INTRAVENOUS | Status: DC | PRN

## 2016-07-23 MED ORDER — FLUTICASONE PROPIONATE 50 MCG/ACT NA SUSP
1.0000 | Freq: Every evening | NASAL | Status: DC | PRN
  Filled 2016-07-23: qty 16

## 2016-07-23 MED ORDER — HEPARIN (PORCINE) IN NACL 100-0.45 UNIT/ML-% IJ SOLN
950.0000 [IU]/h | INTRAMUSCULAR | Status: DC
  Filled 2016-07-23: qty 250

## 2016-07-23 MED ORDER — ACETAMINOPHEN 325 MG PO TABS: 650.0000 mg | ORAL_TABLET | ORAL | Status: DC | PRN

## 2016-07-23 MED ORDER — ASPIRIN 81 MG PO CHEW
324.0000 mg | CHEWABLE_TABLET | Freq: Once | ORAL | Status: AC
  Administered 2016-07-23: 324 mg via ORAL
  Filled 2016-07-23: qty 4

## 2016-07-23 MED ORDER — SODIUM CHLORIDE 0.9% FLUSH: 3.0000 mL | INTRAVENOUS | Status: DC | PRN

## 2016-07-23 MED ORDER — METOPROLOL TARTRATE 25 MG PO TABS
25.0000 mg | ORAL_TABLET | Freq: Two times a day (BID) | ORAL | Status: DC
  Administered 2016-07-23: 25 mg via ORAL
  Filled 2016-07-23 (×2): qty 1

## 2016-07-23 MED ORDER — HEPARIN BOLUS VIA INFUSION
4000.0000 [IU] | Freq: Once | INTRAVENOUS | Status: DC
  Filled 2016-07-23: qty 4000

## 2016-07-23 MED ORDER — ASPIRIN 81 MG PO CHEW
81.0000 mg | CHEWABLE_TABLET | ORAL | Status: AC
  Administered 2016-07-24: 81 mg via ORAL
  Filled 2016-07-23: qty 1

## 2016-07-23 NOTE — Progress Notes (Signed)
Received call from lab, 15:25 troponin was 1.22.  Notified Dr. Bridgett Larsson. No new orders received. Lauris Poag, RN 07/23/16 17:00

## 2016-07-23 NOTE — Progress Notes (Signed)
Patient's HR fluctuating from 48 to 53 on the cardiac monitor and BP=143/101, 140/103 and there's Metoprolol 25 mg scheduled  to be administered tonight. Dr. Ara Kussmaul notified with a new order to hold the metoprolol with the HR <55. Patient is aware of the MD decision and agreed to it. Will continue to monitor.

## 2016-07-23 NOTE — H&P (Signed)
[facility] [place] NAME: Nicholas Torres    MR#:  [id number]  DATE OF BIRTH:  [birth year]  DATE OF ADMISSION:  07/23/2016  PRIMARY CARE PHYSICIAN: Kennon Portela, MD   REQUESTING/REFERRING PHYSICIAN: Joanne Gavel, MD  CHIEF COMPLAINT:   Chief Complaint  Patient presents with  . Chest Pain    HISTORY OF PRESENT ILLNESS:  Nicholas Torres  is a 55 y.o. male with a known history of hypertension and hyperlipidemia. The patient to present to the ED is intermittent exertional chest discomfort for the past 2 weeks, associated with some shortness of breath. His troponin is elevated at 0.05. EKG show ST-T depression. Dr. Humphrey Rolls suggested started on Lovenox.  PAST MEDICAL HISTORY:   Past Medical History:  Diagnosis Date  . Allergy   . High cholesterol   . Hypertension     PAST SURGICAL HISTORY:   Past Surgical History:  Procedure Laterality Date  . no prior surgery      SOCIAL HISTORY:   Social History  Substance Use Topics  . Smoking status: Never Smoker  . Smokeless tobacco: Never Used  . Alcohol use No    FAMILY HISTORY:   Family History  Problem Relation Age of Onset  . Diabetes Mother   . Heart attack Father   . CAD Brother     DRUG ALLERGIES:  No Known Allergies  REVIEW OF SYSTEMS:   Review of Systems  Constitutional: Negative for chills and fever.  HENT: Negative for congestion.   Respiratory: Positive for shortness of breath. Negative for cough, hemoptysis, sputum production, wheezing and stridor.   Cardiovascular: Positive for chest pain. Negative for palpitations, orthopnea and leg swelling.  Gastrointestinal: Negative for abdominal pain, blood in stool, diarrhea, melena, nausea and vomiting.  Genitourinary: Negative for dysuria and urgency.  Musculoskeletal: Negative for back pain and joint pain.  Skin: Negative for itching and rash.  Neurological: Negative for dizziness, tingling, seizures, loss of  consciousness and headaches.  Psychiatric/Behavioral: Negative for depression.    MEDICATIONS AT HOME:   Prior to Admission medications   Medication Sig Start Date End Date Taking? Authorizing Provider  atenolol (TENORMIN) 25 MG tablet Take by mouth at bedtime.   Yes Historical Provider, MD  fluticasone (FLONASE) 50 MCG/ACT nasal spray Place 1 spray into both nostrils at bedtime as needed for rhinitis. 06/01/16  Yes Historical Provider, MD  rosuvastatin (CRESTOR) 10 MG tablet Take 10 mg by mouth at bedtime. 06/25/16  Yes Historical Provider, MD  meloxicam (MOBIC) 15 MG tablet Take 1 tablet (15 mg total) by mouth daily. 08/09/14   Leandrew Koyanagi, MD  sildenafil (VIAGRA) 50 MG tablet Take 1 tablet (50 mg total) by mouth daily as needed for erectile dysfunction. 07/10/13   Orma Flaming, MD      VITAL SIGNS:  Blood pressure (!) 149/109, pulse (!) 57, temperature 98.8 F (37.1 C), temperature source Oral, resp. rate 15, height 5\' 5"  (1.651 m), weight 191 lb (86.6 kg), SpO2 97 %.  PHYSICAL EXAMINATION:  Physical Exam  Constitutional: He is oriented to person, place, and time and well-developed, well-nourished, and in no distress. No distress.  HENT:  Head: Normocephalic.  Mouth/Throat: No oropharyngeal exudate.  Eyes: Conjunctivae and EOM are normal. Pupils are equal, round, and reactive to light. No scleral icterus.  Neck: Normal range of motion. Neck supple. No JVD present. No thyromegaly present.  Cardiovascular: Normal rate, regular rhythm and normal heart sounds.  Exam reveals no gallop and no friction rub.   No murmur heard. Pulmonary/Chest: Effort normal and breath sounds normal. No respiratory distress. He has no wheezes. He has no rales. He exhibits no tenderness.  Abdominal: Soft. Bowel sounds are normal. He exhibits no distension. There is no tenderness.  Musculoskeletal: Normal range of motion. He exhibits no edema or tenderness.  Lymphadenopathy:    He has no cervical  adenopathy.  Neurological: He is alert and oriented to person, place, and time. No cranial nerve deficit.  Skin: Skin is warm and dry.  Psychiatric: Mood, memory, affect and judgment normal.    LABORATORY PANEL:   CBC  Recent Labs Lab 07/23/16 0929  WBC 5.5  HGB 14.9  HCT 44.2  PLT 133*   ------------------------------------------------------------------------------------------------------------------  Chemistries   Recent Labs Lab 07/23/16 0929  NA 137  K 3.8  CL 105  CO2 25  GLUCOSE 97  BUN 16  CREATININE 1.06  CALCIUM 9.3  AST 32  ALT 24  ALKPHOS 78  BILITOT 0.6   ------------------------------------------------------------------------------------------------------------------  Cardiac Enzymes  Recent Labs Lab 07/23/16 0929  TROPONINI 0.05*   ------------------------------------------------------------------------------------------------------------------  RADIOLOGY:  Dg Chest 2 View  Result Date: 07/23/2016 CLINICAL DATA:  Chest pain for 2 weeks. EXAM: CHEST  2 VIEW COMPARISON:  None. FINDINGS: The heart size and mediastinal contours are within normal limits. Both lungs are clear. No pneumothorax or pleural effusion is noted. The visualized skeletal structures are unremarkable. IMPRESSION: No active cardiopulmonary disease. Electronically Signed   By: Marijo Conception, M.D.   On: 07/23/2016 09:45      IMPRESSION AND PLAN:   NTEMI. The patient will be admitted to telemetry floor. He was given aspirin 324 mg 1 dose in the ED. Per Dr. Humphrey Rolls, cardiologist, start  full anticoagulation with Lovenox. Start Lipitor and nitroglycerin when necessary, check lipid panel. Follow-up troponin level.  Hypertension. Start Lopressor. Hyperlipidemia. Lipitor.  Mild thrombocytopenia. Follow-up CBC.  I discussed with Dr. Humphrey Rolls. All the records are reviewed and case discussed with ED provider. Management plans discussed with the patient, His wife and they are in  agreement.  CODE STATUS: Full code  TOTAL TIME TAKING CARE OF THIS PATIENT: 55 minutes.    Demetrios Loll M.D on 07/23/2016 at 11:17 AM  Between 7am to 6pm - Pager - [phone]  After 6pm go to [email or web address] - Proofreader  [facility]  Office  [phone]  CC: Primary care physician; Kennon Portela, MD   Note: This dictation was prepared with Dragon dictation along with smaller phrase technology. Any transcriptional errors that result from this process are unintentional.

## 2016-07-23 NOTE — Progress Notes (Signed)
Elih Bennette is a 55 y.o. male  [id number]  Primary Cardiologist: Neoma Laming Reason for Consultation: Elevated troponin and abnormal EKG and shortness of breath  HPI: This is a 55 year old African-American male with a past medical history of hypertension hyperlipidemia presented to the hospital with shortness of breath on minimal exertion and some tightness in the chest. Patient states for the past couple of days on minimal exertion even tying his shoes he felt very short of breath and dizzy with some tightness in the chest. I just didn't feel good.   Review of Systems: No orthopnea PND or leg swelling   Past Medical History:  Diagnosis Date  . Allergy   . High cholesterol   . Hypertension      (Not in a hospital admission)   . enoxaparin (LOVENOX) injection  1 mg/kg Subcutaneous BID    Infusions:    No Known Allergies  Social History   Social History  . Marital status: Single    Spouse name: N/A  . Number of children: N/A  . Years of education: N/A   Occupational History  . Not on file.   Social History Main Topics  . Smoking status: Never Smoker  . Smokeless tobacco: Never Used  . Alcohol use No  . Drug use: No  . Sexual activity: Not on file   Other Topics Concern  . Not on file   Social History Narrative  . No narrative on file    Family History  Problem Relation Age of Onset  . Diabetes Mother   . Heart attack Father   . CAD Brother     PHYSICAL EXAM: Vitals:   07/23/16 1130 07/23/16 1200  BP: (!) 146/102 (!) 138/101  Pulse: (!) 54 (!) 58  Resp: 19 15  Temp:      No intake or output data in the 24 hours ending 07/23/16 1203  General:  Well appearing. No respiratory difficulty HEENT: normal Neck: supple. no JVD. Carotids 2+ bilat; no bruits. No lymphadenopathy or thryomegaly appreciated. Cor: PMI nondisplaced. Regular rate & rhythm. No rubs, gallops or murmurs. Lungs: clear Abdomen: soft, nontender, nondistended. No  hepatosplenomegaly. No bruits or masses. Good bowel sounds. Extremities: no cyanosis, clubbing, rash, edema Neuro: alert & oriented x 3, cranial nerves grossly intact. moves all 4 extremities w/o difficulty. Affect pleasant.  [id number] sinus rhythm with diffuse ischemic type of ST depression in inferolateral leads with no ST elevation  Results for orders placed or performed during the hospital encounter of 07/23/16 (from the past 24 hour(s))  CBC with Differential     Status: Abnormal   Collection Time: 07/23/16  9:29 AM  Result Value Ref Range   WBC 5.5 3.8 - 10.6 K/uL   RBC 4.77 4.40 - 5.90 MIL/uL   Hemoglobin 14.9 13.0 - 18.0 g/dL   HCT 44.2 40.0 - 52.0 %   MCV 92.6 80.0 - 100.0 fL   MCH 31.3 26.0 - 34.0 pg   MCHC 33.8 32.0 - 36.0 g/dL   RDW 12.9 11.5 - 14.5 %   Platelets 133 (L) 150 - 440 K/uL   Neutrophils Relative % 34 %   Neutro Abs 1.9 1.4 - 6.5 K/uL   Lymphocytes Relative 52 %   Lymphs Abs 2.9 1.0 - 3.6 K/uL   Monocytes Relative 12 %   Monocytes Absolute 0.6 0.2 - 1.0 K/uL   Eosinophils Relative 2 %   Eosinophils Absolute 0.1 0 - 0.7 K/uL   Basophils Relative 0 %  Basophils Absolute 0.0 0 - 0.1 K/uL  Comprehensive metabolic panel     Status: Abnormal   Collection Time: 07/23/16  9:29 AM  Result Value Ref Range   Sodium 137 135 - 145 mmol/L   Potassium 3.8 3.5 - 5.1 mmol/L   Chloride 105 101 - 111 mmol/L   CO2 25 22 - 32 mmol/L   Glucose, Bld 97 65 - 99 mg/dL   BUN 16 6 - 20 mg/dL   Creatinine, Ser 1.06 0.61 - 1.24 mg/dL   Calcium 9.3 8.9 - 10.3 mg/dL   Total Protein 8.4 (H) 6.5 - 8.1 g/dL   Albumin 4.4 3.5 - 5.0 g/dL   AST 32 15 - 41 U/L   ALT 24 17 - 63 U/L   Alkaline Phosphatase 78 38 - 126 U/L   Total Bilirubin 0.6 0.3 - 1.2 mg/dL   GFR calc non Af Amer >60 >60 mL/min   GFR calc Af Amer >60 >60 mL/min   Anion gap 7 5 - 15  Troponin I     Status: Abnormal   Collection Time: 07/23/16  9:29 AM  Result Value Ref Range   Troponin I 0.05 (HH) <0.03 ng/mL   Protime-INR     Status: None   Collection Time: 07/23/16  9:29 AM  Result Value Ref Range   Prothrombin Time 14.4 11.4 - 15.2 seconds   INR 1.11   APTT     Status: None   Collection Time: 07/23/16  9:29 AM  Result Value Ref Range   aPTT 31 24 - 36 seconds   Dg Chest 2 View  Result Date: 07/23/2016 CLINICAL DATA:  Chest pain for 2 weeks. EXAM: CHEST  2 VIEW COMPARISON:  None. FINDINGS: The heart size and mediastinal contours are within normal limits. Both lungs are clear. No pneumothorax or pleural effusion is noted. The visualized skeletal structures are unremarkable. IMPRESSION: No active cardiopulmonary disease. Electronically Signed   By: Marijo Conception, M.D.   On: 07/23/2016 09:45     ASSESSMENT AND PLAN:Acute coronary syndrome with ischemic changes on EKG and atypical present patient with just not feeling well and short of breath. EKG has ischemic changes and troponins are mildly elevated and has risk factors for coronary artery disease. Patient was explained left heart catheterization and possible PCI and risk and benefits have been explained to the patient and will be set up for tomorrow morning. The meantime advise given Lovenox since platelets are borderline and aspirin and nitrates.   KHAN,SHAUKAT A

## 2016-07-23 NOTE — ED Triage Notes (Signed)
States feeling more tired then usual during excretion, states chest tightness, states some SOB, pt awake and alert upon arrival

## 2016-07-23 NOTE — ED Provider Notes (Signed)
[facility] Emergency Department Provider Note   ____________________________________________   First MD Initiated Contact with Patient 07/23/16 [phone]     (approximate)  I have reviewed the triage vital signs and the nursing notes.   HISTORY  Chief Complaint Chest Pain    HPI Nicholas Torres is a 55 y.o. male with hypertension and hyperlipidemia presents for evaluation of 2 weeks of intermittent exertional chest pain and shortness of breath, resolves with rest, currently resolved, no other modifying factors. No vomiting, diarrhea, fevers or chills. No abdominal pain, vomiting or diarrhea.   Past Medical History:  Diagnosis Date  . Allergy   . High cholesterol   . Hypertension     Patient Active Problem List   Diagnosis Date Noted  . NSTEMI (non-ST elevated myocardial infarction) ([place]) 07/23/2016  . Family history of ischemic heart disease (IHD) 07/10/2013    Past Surgical History:  Procedure Laterality Date  . no prior surgery      Prior to Admission medications   Medication Sig Start Date End Date Taking? Authorizing Provider  atenolol (TENORMIN) 25 MG tablet Take by mouth at bedtime.   Yes Historical Provider, MD  fluticasone (FLONASE) 50 MCG/ACT nasal spray Place 1 spray into both nostrils at bedtime as needed for rhinitis. 06/01/16  Yes Historical Provider, MD  rosuvastatin (CRESTOR) 10 MG tablet Take 10 mg by mouth at bedtime. 06/25/16  Yes Historical Provider, MD  meloxicam (MOBIC) 15 MG tablet Take 1 tablet (15 mg total) by mouth daily. 08/09/14   Leandrew Koyanagi, MD  sildenafil (VIAGRA) 50 MG tablet Take 1 tablet (50 mg total) by mouth daily as needed for erectile dysfunction. 07/10/13   Orma Flaming, MD    Allergies Review of patient's allergies indicates no known allergies.  Family History  Problem Relation Age of Onset  . Diabetes Mother   . Colon cancer Neg Hx     Social History Social History  Substance Use Topics  .  Smoking status: Never Smoker  . Smokeless tobacco: Never Used  . Alcohol use No    Review of Systems Constitutional: No fever/chills Eyes: No visual changes. ENT: No sore throat. Cardiovascular: + chest pain. Respiratory: +shortness of breath. Gastrointestinal: No abdominal pain.  No nausea, no vomiting.  No diarrhea.  No constipation. Genitourinary: Negative for dysuria. Musculoskeletal: Negative for back pain. Skin: Negative for rash. Neurological: Negative for headaches, focal weakness or numbness.  10-point ROS otherwise negative.  ____________________________________________   PHYSICAL EXAM:  VITAL SIGNS: ED Triage Vitals  Enc Vitals Group     BP 07/23/16 0914 (!) 160/110     Pulse Rate 07/23/16 0914 61     Resp 07/23/16 0914 18     Temp 07/23/16 0914 98.8 F (37.1 C)     Temp Source 07/23/16 0914 Oral     SpO2 07/23/16 0914 98 %     Weight 07/23/16 0907 191 lb (86.6 kg)     Height 07/23/16 0907 5\' 5"  (1.651 m)     Head Circumference --      Peak Flow --      Pain Score 07/23/16 0907 0     Pain Loc --      Pain Edu? --      Excl. in [place]? --     Constitutional: Alert and oriented. Well appearing and in no acute distress. Eyes: Conjunctivae are normal. PERRL. EOMI. Head: Atraumatic. Nose: No congestion/rhinnorhea. Mouth/Throat: Mucous membranes are moist.  Oropharynx non-erythematous. Neck:  No stridor. Supple without meningismus. Cardiovascular: Normal rate, regular rhythm. Grossly normal heart sounds.  Good peripheral circulation. Respiratory: Normal respiratory effort.  No retractions. Lungs CTAB. Gastrointestinal: Soft and nontender. No distention. No CVA tenderness. Genitourinary: deferred Musculoskeletal: No lower extremity tenderness nor edema.  No joint effusions. Neurologic:  Normal speech and language. No gross focal neurologic deficits are appreciated. No gait instability. Skin:  Skin is warm, dry and intact. No rash noted. Psychiatric: Mood and  affect are normal. Speech and behavior are normal.  ____________________________________________   LABS (all labs ordered are listed, but only abnormal results are displayed)  Labs Reviewed  CBC WITH DIFFERENTIAL/PLATELET - Abnormal; Notable for the following:       Result Value   Platelets 133 (*)    All other components within normal limits  COMPREHENSIVE METABOLIC PANEL - Abnormal; Notable for the following:    Total Protein 8.4 (*)    All other components within normal limits  TROPONIN I - Abnormal; Notable for the following:    Troponin I 0.05 (*)    All other components within normal limits  PROTIME-INR  APTT   ____________________________________________  EKG  ED ECG REPORT I, Joanne Gavel, the attending physician, personally viewed and interpreted this ECG.   Date: 07/23/2016  EKG Time: 09:08  Rate: 54  Rhythm: sinus bradycardia  Axis: normal  Intervals:none  ST&T Change: No acute ST elevation MI. ST depressions in lead 2, V3, V4, V5, V6.  ____________________________________________  RADIOLOGY  CXR IMPRESSION:  No active cardiopulmonary disease.       ____________________________________________   PROCEDURES  Procedure(s) performed: None  Procedures  Critical Care performed:   CRITICAL CARE Performed by: Loura Pardon A   Total critical care time: 30 minutes  Critical care time was exclusive of separately billable procedures and treating other patients.  Critical care was necessary to treat or prevent imminent or life-threatening deterioration.  Critical care was time spent personally by me on the following activities: development of treatment plan with patient and/or surrogate as well as nursing, discussions with consultants, evaluation of patient's response to treatment, examination of patient, obtaining history from patient or surrogate, ordering and performing treatments and interventions, ordering and review of laboratory studies,  ordering and review of radiographic studies, pulse oximetry and re-evaluation of patient's condition.  ____________________________________________   INITIAL IMPRESSION / ASSESSMENT AND PLAN / ED COURSE  Pertinent labs & imaging results that were available during my care of the patient were reviewed by me and considered in my medical decision making (see chart for details).  Nicholas Torres is a 55 y.o. male with hypertension and hyperlipidemia presents for evaluation of 2 weeks of intermittent exertional chest pain and shortness of breath, resolves with rest, really resolved. On exam he is very well-appearing and in no acute distress. Vital signs stable and he is afebrile. He is completing the chest pain-free, and has no complaints at this time. His EKG is concerning for ischemia diffuse ST depressions in multiple leads. Certainly his symptoms sound consistent with stable angina given resolution with rest however given his EKG changes, I'm concerned he may require catheterization. We'll obtain screening cardiac labs, chest x-ray, discussed with cardiology and anticipate admission. We'll give aspirin.  ----------------------------------------- 11:01 AM on 07/23/2016 -----------------------------------------  Patient remains chest pain-free and without complaints. CMP unremarkable and coags. CBC with mild thrombocytopenia, platelet count 133. Troponin is elevated at 0.05 concerning for NSTEMI. Chest x-ray shows no acute cardiopulmonary abnormality. I discussed the case  with Dr. Chancy Milroy of cardiology was also reviewed the EKG and agrees, no STEMI but patient will likely require catheterization. He is coming to evaluate the patient. We discussed therapeutic anticoagulation options including heparin and or Lovenox given that the patient is mildly thrombocytopenic, he will evaluate the patient first and make formal recommendations. I discussed this with the admitting hospitalist, Dr. Geryl Councilman and anticoagulation  choice will be left to thediscretion of the admitting hospitalist and Dr. Chancy Milroy.  Clinical Course     ____________________________________________   FINAL CLINICAL IMPRESSION(S) / ED DIAGNOSES  Final diagnoses:  NSTEMI (non-ST elevated myocardial infarction) ([place])  Chest pain, unspecified chest pain type      NEW MEDICATIONS STARTED DURING THIS VISIT:  New Prescriptions   No medications on file     Note:  This document was prepared using Dragon voice recognition software and may include unintentional dictation errors.    Joanne Gavel, MD 07/23/16 1115

## 2016-07-23 NOTE — Progress Notes (Addendum)
ANTICOAGULATION CONSULT NOTE - Initial Consult  Pharmacy Consult for Lovenox Indication: chest pain/ACS  No Known Allergies  Patient Measurements: Height: 5\' 5"  (165.1 cm) Weight: 191 lb (86.6 kg) IBW/kg (Calculated) : 61.5   Vital Signs: Temp: 98.8 F (37.1 C) (09/10 0914) Temp Source: Oral (09/10 0914) BP: 149/109 (09/10 1031) Pulse Rate: 57 (09/10 1031)  Labs:  Recent Labs  07/23/16 0929  HGB 14.9  HCT 44.2  PLT 133*  APTT 31  LABPROT 14.4  INR 1.11  CREATININE 1.06  TROPONINI 0.05*    Estimated Creatinine Clearance: 82.4 mL/min (by C-G formula based on SCr of 1.06 mg/dL).   Medical History: Past Medical History:  Diagnosis Date  . Allergy   . High cholesterol   . Hypertension     Medications:   (Not in a hospital admission) Scheduled:  . enoxaparin (LOVENOX) injection  1 mg/kg Subcutaneous BID    Assessment: 55 y/o M admitted with NSTEMI for cath 9/11. No anticoagulants PTA per Dr. Humphrey Rolls.   Goal of Therapy:  Monitor platelets by anticoagulation protocol: Yes   Plan:  Lovenox 1 mg/kg q 12 hours.   Ulice Dash D 07/23/2016,11:27 AM

## 2016-07-24 ENCOUNTER — Other Ambulatory Visit: Payer: Self-pay | Admitting: *Deleted

## 2016-07-24 ENCOUNTER — Encounter
Admission: EM | Disposition: A | Discharge status: Other Acute Inpatient Hospital | Payer: Self-pay | Source: Home / Self Care | Attending: Internal Medicine

## 2016-07-24 ENCOUNTER — Encounter: Payer: Self-pay | Admitting: Cardiovascular Disease

## 2016-07-24 ENCOUNTER — Inpatient Hospital Stay (HOSPITAL_COMMUNITY)
Admission: AD | Admit: 2016-07-24 | Discharge: 2016-07-31 | DRG: 236 | Disposition: A | Discharge status: Home / Self Care | Payer: BLUE CROSS/BLUE SHIELD | Source: Other Acute Inpatient Hospital | Attending: Thoracic Surgery (Cardiothoracic Vascular Surgery) | Admitting: Thoracic Surgery (Cardiothoracic Vascular Surgery)

## 2016-07-24 DIAGNOSIS — I10 Essential (primary) hypertension: Secondary | ICD-10-CM | POA: Diagnosis present

## 2016-07-24 DIAGNOSIS — I214 Non-ST elevation (NSTEMI) myocardial infarction: Secondary | ICD-10-CM | POA: Diagnosis present

## 2016-07-24 DIAGNOSIS — E8770 Fluid overload, unspecified: Secondary | ICD-10-CM | POA: Diagnosis not present

## 2016-07-24 DIAGNOSIS — I2510 Atherosclerotic heart disease of native coronary artery without angina pectoris: Secondary | ICD-10-CM | POA: Diagnosis present

## 2016-07-24 DIAGNOSIS — K567 Ileus, unspecified: Secondary | ICD-10-CM | POA: Diagnosis not present

## 2016-07-24 DIAGNOSIS — D696 Thrombocytopenia, unspecified: Secondary | ICD-10-CM | POA: Diagnosis not present

## 2016-07-24 DIAGNOSIS — D62 Acute posthemorrhagic anemia: Secondary | ICD-10-CM | POA: Diagnosis not present

## 2016-07-24 DIAGNOSIS — R001 Bradycardia, unspecified: Secondary | ICD-10-CM | POA: Diagnosis present

## 2016-07-24 DIAGNOSIS — Z8249 Family history of ischemic heart disease and other diseases of the circulatory system: Secondary | ICD-10-CM

## 2016-07-24 DIAGNOSIS — Z7982 Long term (current) use of aspirin: Secondary | ICD-10-CM | POA: Diagnosis not present

## 2016-07-24 DIAGNOSIS — E785 Hyperlipidemia, unspecified: Secondary | ICD-10-CM | POA: Diagnosis present

## 2016-07-24 DIAGNOSIS — I340 Nonrheumatic mitral (valve) insufficiency: Secondary | ICD-10-CM | POA: Diagnosis present

## 2016-07-24 DIAGNOSIS — Z791 Long term (current) use of non-steroidal anti-inflammatories (NSAID): Secondary | ICD-10-CM

## 2016-07-24 DIAGNOSIS — Z79899 Other long term (current) drug therapy: Secondary | ICD-10-CM

## 2016-07-24 DIAGNOSIS — Z01810 Encounter for preprocedural cardiovascular examination: Secondary | ICD-10-CM | POA: Diagnosis not present

## 2016-07-24 DIAGNOSIS — J9811 Atelectasis: Secondary | ICD-10-CM | POA: Diagnosis not present

## 2016-07-24 DIAGNOSIS — R079 Chest pain, unspecified: Secondary | ICD-10-CM

## 2016-07-24 DIAGNOSIS — Z951 Presence of aortocoronary bypass graft: Secondary | ICD-10-CM

## 2016-07-24 DIAGNOSIS — I25110 Atherosclerotic heart disease of native coronary artery with unstable angina pectoris: Secondary | ICD-10-CM | POA: Diagnosis not present

## 2016-07-24 HISTORY — DX: Other allergy status, other than to drugs and biological substances: Z91.09

## 2016-07-24 HISTORY — PX: CARDIAC CATHETERIZATION: SHX172

## 2016-07-24 LAB — BASIC METABOLIC PANEL
ANION GAP: 5 (ref 5–15)
BUN: 14 mg/dL (ref 6–20)
CO2: 27 mmol/L (ref 22–32)
Calcium: 9 mg/dL (ref 8.9–10.3)
Chloride: 106 mmol/L (ref 101–111)
Creatinine, Ser: 1.22 mg/dL (ref 0.61–1.24)
GFR calc Af Amer: >60 mL/min (ref >60)
GFR calc non Af Amer: >60 mL/min (ref >60)
GLUCOSE: 100 mg/dL — AB (ref 65–99)
POTASSIUM: 4.1 mmol/L (ref 3.5–5.1)
Sodium: 138 mmol/L (ref 135–145)

## 2016-07-24 LAB — CBC
HCT: 42.4 % (ref 40.0–52.0)
Hemoglobin: 14.4 g/dL (ref 13.0–18.0)
MCH: 31.5 pg (ref 26.0–34.0)
MCHC: 34.1 g/dL (ref 32.0–36.0)
MCV: 92.5 fL (ref 80.0–100.0)
Platelets: 137 10*3/uL — ABNORMAL LOW (ref 150–440)
RBC: 4.58 MIL/uL (ref 4.40–5.90)
RDW: 12.8 % (ref 11.5–14.5)
WBC: 6.7 10*3/uL (ref 3.8–10.6)

## 2016-07-24 LAB — LIPID PANEL
CHOL/HDL RATIO: 3.8 ratio
CHOLESTEROL: 165 mg/dL (ref 0–200)
HDL: 44 mg/dL (ref >40)
LDL CALC: 104 mg/dL — AB (ref 0–99)
TRIGLYCERIDES: 86 mg/dL (ref <150)
VLDL: 17 mg/dL (ref 0–40)

## 2016-07-24 SURGERY — LEFT HEART CATH AND CORONARY ANGIOGRAPHY
Anesthesia: Moderate Sedation | Laterality: Right

## 2016-07-24 MED ORDER — ALPRAZOLAM 0.25 MG PO TABS: 0.2500 mg | ORAL_TABLET | ORAL | Status: DC | PRN

## 2016-07-24 MED ORDER — HEPARIN (PORCINE) IN NACL 2-0.9 UNIT/ML-% IJ SOLN
INTRAMUSCULAR | Status: AC
  Filled 2016-07-24: qty 500

## 2016-07-24 MED ORDER — ATENOLOL 25 MG PO TABS
25.0000 mg | ORAL_TABLET | Freq: Every day | ORAL | Status: DC
  Administered 2016-07-25: 25 mg via ORAL
  Filled 2016-07-24 (×2): qty 1

## 2016-07-24 MED ORDER — ROSUVASTATIN CALCIUM 10 MG PO TABS
10.0000 mg | ORAL_TABLET | Freq: Every day | ORAL | Status: DC
  Administered 2016-07-24 – 2016-07-30 (×6): 10 mg via ORAL
  Filled 2016-07-24 (×6): qty 1

## 2016-07-24 MED ORDER — MIDAZOLAM HCL 2 MG/2ML IJ SOLN
INTRAMUSCULAR | Status: DC | PRN
  Administered 2016-07-24: 1 mg via INTRAVENOUS

## 2016-07-24 MED ORDER — ASPIRIN EC 81 MG PO TBEC
81.0000 mg | DELAYED_RELEASE_TABLET | Freq: Every day | ORAL | Status: DC
  Administered 2016-07-24 – 2016-07-25 (×2): 81 mg via ORAL
  Filled 2016-07-24 (×2): qty 1

## 2016-07-24 MED ORDER — ASPIRIN EC 81 MG PO TBEC: 81.0000 mg | DELAYED_RELEASE_TABLET | Freq: Every day | ORAL | Status: DC

## 2016-07-24 MED ORDER — ACETAMINOPHEN 325 MG PO TABS: 650.0000 mg | ORAL_TABLET | ORAL | Status: DC | PRN

## 2016-07-24 MED ORDER — ENOXAPARIN SODIUM 150 MG/ML ~~LOC~~ SOLN: 1.0000 mg/kg | Freq: Two times a day (BID) | SUBCUTANEOUS | Status: DC

## 2016-07-24 MED ORDER — SODIUM CHLORIDE 0.9% FLUSH: 3.0000 mL | Freq: Two times a day (BID) | INTRAVENOUS | Status: DC

## 2016-07-24 MED ORDER — ONDANSETRON HCL 4 MG/2ML IJ SOLN: 4.0000 mg | Freq: Four times a day (QID) | INTRAMUSCULAR | Status: DC | PRN

## 2016-07-24 MED ORDER — FENTANYL CITRATE (PF) 100 MCG/2ML IJ SOLN
INTRAMUSCULAR | Status: AC
  Filled 2016-07-24: qty 2

## 2016-07-24 MED ORDER — SODIUM CHLORIDE 0.9 % WEIGHT BASED INFUSION: 1.0000 mL/kg/h | INTRAVENOUS | Status: AC

## 2016-07-24 MED ORDER — MIDAZOLAM HCL 2 MG/2ML IJ SOLN
INTRAMUSCULAR | Status: AC
  Filled 2016-07-24: qty 2

## 2016-07-24 MED ORDER — IOPAMIDOL (ISOVUE-300) INJECTION 61%
INTRAVENOUS | Status: DC | PRN
  Administered 2016-07-24: 140 mL via INTRA_ARTERIAL

## 2016-07-24 MED ORDER — METOPROLOL TARTRATE 25 MG PO TABS: 25.0000 mg | ORAL_TABLET | Freq: Two times a day (BID) | ORAL | Status: DC

## 2016-07-24 MED ORDER — ATORVASTATIN CALCIUM 80 MG PO TABS: 80.0000 mg | ORAL_TABLET | Freq: Every day | ORAL | Status: DC

## 2016-07-24 MED ORDER — MORPHINE SULFATE (PF) 2 MG/ML IV SOLN: 2.0000 mg | INTRAVENOUS | Status: DC | PRN

## 2016-07-24 MED ORDER — HEPARIN (PORCINE) IN NACL 100-0.45 UNIT/ML-% IJ SOLN
950.0000 [IU]/h | INTRAMUSCULAR | Status: DC
  Administered 2016-07-24: 1100 [IU]/h via INTRAVENOUS
  Administered 2016-07-25: 950 [IU]/h via INTRAVENOUS
  Filled 2016-07-24 (×2): qty 250

## 2016-07-24 MED ORDER — SODIUM CHLORIDE 0.9 % IV SOLN: 250.0000 mL | INTRAVENOUS | Status: DC | PRN

## 2016-07-24 MED ORDER — SODIUM CHLORIDE 0.9% FLUSH: 3.0000 mL | INTRAVENOUS | Status: DC | PRN

## 2016-07-24 MED ORDER — FENTANYL CITRATE (PF) 100 MCG/2ML IJ SOLN
INTRAMUSCULAR | Status: DC | PRN
  Administered 2016-07-24: 50 ug via INTRAVENOUS

## 2016-07-24 MED ORDER — FLUTICASONE PROPIONATE 50 MCG/ACT NA SUSP
1.0000 | Freq: Every evening | NASAL | Status: DC | PRN
  Filled 2016-07-24: qty 16

## 2016-07-24 SURGICAL SUPPLY — 10 items
CATH 5FR JL4 DIAGNOSTIC (CATHETERS) ×1 IMPLANT
CATH 5FR JR4 DIAGNOSTIC (CATHETERS) ×1 IMPLANT
CATH INFINITI 5FR ANG PIGTAIL (CATHETERS) ×1 IMPLANT
DEVICE CLOSURE MYNXGRIP 5F (Vascular Products) ×1 IMPLANT
KIT MANI 3VAL PERCEP (MISCELLANEOUS) ×2 IMPLANT
NDL PERC 18GX7CM (NEEDLE) IMPLANT
NEEDLE PERC 18GX7CM (NEEDLE) ×2 IMPLANT
PACK CARDIAC CATH (CUSTOM PROCEDURE TRAY) ×2 IMPLANT
SHEATH PINNACLE 5F 10CM (SHEATH) ×1 IMPLANT
WIRE EMERALD 3MM-J .035X150CM (WIRE) ×1 IMPLANT

## 2016-07-24 NOTE — Progress Notes (Signed)
SUBJECTIVE: No further chest pain   Vitals:   07/23/16 1939 07/23/16 2003 07/24/16 0539 07/24/16 0737  BP: (!) 143/101 (!) 140/103 118/89 (!) 127/94  Pulse: (!) 51 (!) 52 (!) 55 (!) 56  Resp: 18 18 18    Temp: 98.2 F (36.8 C) 98.5 F (36.9 C) 98 F (36.7 C) 98 F (36.7 C)  TempSrc: Oral Oral Oral   SpO2: 91% 96% 98% 100%  Weight:      Height:        Intake/Output Summary (Last 24 hours) at 07/24/16 0841 Last data filed at 07/24/16 0537  Gross per 24 hour  Intake                0 ml  Output              575 ml  Net             -575 ml    LABS: Basic Metabolic Panel:  Recent Labs  07/23/16 0929 07/24/16 0612  NA 137 138  K 3.8 4.1  CL 105 106  CO2 25 27  GLUCOSE 97 100*  BUN 16 14  CREATININE 1.06 1.22  CALCIUM 9.3 9.0   Liver Function Tests:  Recent Labs  07/23/16 0929  AST 32  ALT 24  ALKPHOS 78  BILITOT 0.6  PROT 8.4*  ALBUMIN 4.4   No results for input(s): LIPASE, AMYLASE in the last 72 hours. CBC:  Recent Labs  07/23/16 0929 07/24/16 0612  WBC 5.5 6.7  NEUTROABS 1.9  --   HGB 14.9 14.4  HCT 44.2 42.4  MCV 92.6 92.5  PLT 133* 137*   Cardiac Enzymes:  Recent Labs  07/23/16 0929 07/23/16 1525 07/23/16 2139  TROPONINI 0.05* 1.22* 5.27*   BNP: Invalid input(s): POCBNP D-Dimer: No results for input(s): DDIMER in the last 72 hours. Hemoglobin A1C: No results for input(s): HGBA1C in the last 72 hours. Fasting Lipid Panel:  Recent Labs  07/24/16 0612  CHOL 165  HDL 44  LDLCALC 104*  TRIG 86  CHOLHDL 3.8   Thyroid Function Tests: No results for input(s): TSH, T4TOTAL, T3FREE, THYROIDAB in the last 72 hours.  Invalid input(s): FREET3 Anemia Panel: No results for input(s): VITAMINB12, FOLATE, FERRITIN, TIBC, IRON, RETICCTPCT in the last 72 hours.   PHYSICAL EXAM General: Well developed, well nourished, in no acute distress HEENT:  Normocephalic and atramatic Neck:  No JVD.  Lungs: Clear bilaterally to auscultation and  percussion. Heart: HRRR . Normal S1 and S2 without gallops or murmurs.  Abdomen: Bowel sounds are positive, abdomen soft and non-tender  Msk:  Back normal, normal gait. Normal strength and tone for age. Extremities: No clubbing, cyanosis or edema.   Neuro: Alert and oriented X 3. Psych:  Good affect, responds appropriately  TELEMETRY:Sinus rhythm  ASSESSMENT AND PLAN: Acute coronary syndrome with ruling in for non-STEMI, planning on doing cardiac catheterization this morning. No further chest pain.  Active Problems:   NSTEMI (non-ST elevated myocardial infarction) ([place])    Neoma Laming A, MD, [facility] 07/24/2016 8:41 AM

## 2016-07-24 NOTE — Progress Notes (Signed)
Patient denies any acute pain. Ready for cath this AM. Bilateral groin clipped. Wife at bedside, voiced no complain. Reading material about the cath given to patient.  Will continue to monitor.

## 2016-07-24 NOTE — Progress Notes (Signed)
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 Advise CABG  and called [facility] and spoke to [place] to transfer patient. Also will f/u with me in 2 weeks.

## 2016-07-24 NOTE — Progress Notes (Addendum)
ANTICOAGULATION CONSULT NOTE - Initial Consult  Pharmacy Consult for  Heparin Indication: NSTEMI, CAD awaiting CABG  No Known Allergies  Patient Measurements: Height: 5\' 5"  (165.1 cm) Weight: 186 lb 1.6 oz (84.4 kg) IBW/kg (Calculated) : 61.5 Heparin Dosing Weight: 79 kg  Vital Signs: Temp: 99.1 F (37.3 C) (09/11 1310) Temp Source: Oral (09/11 1310) BP: 136/96 (09/11 1310) Pulse Rate: 56 (09/11 1310)  Labs:  Recent Labs  07/23/16 0929 07/23/16 1525 07/23/16 2139 07/24/16 0612  HGB 14.9  --   --  14.4  HCT 44.2  --   --  42.4  PLT 133*  --   --  137*  APTT 31  --   --   --   LABPROT 14.4  --   --   --   INR 1.11  --   --   --   CREATININE 1.06  --   --  1.22  TROPONINI 0.05* 1.22* 5.27*  --     Estimated Creatinine Clearance: 70.8 mL/min (by C-G formula based on SCr of 1.22 mg/dL).   Medical History: Past Medical History:  Diagnosis Date  . Allergy   . High cholesterol   . Hypertension    Assessment:   55 yr old male transferred from [facility] to [facility] post-cath today.  Multivessel CAD, for CABG on 07/26/16.    Had been on Lovenox for NSTEMI at [facility], last dose 9/10 at 11:22pm.    Groin site noted without bleeding or hematoma post-cath; closure device place at 10:08am today.  Goal of Therapy:  Heparin level 0.3-0.7 units/ml Monitor platelets by anticoagulation protocol: Yes   Plan:   No heparin bolus post-cath.  Begin heparin drip at 1100 units/hr.  Heparin level ~6hrs after drip begins.  Daily heparin level and CBC while on heparin.  Arty Baumgartner, [place] Pager: [phone] 07/24/2016,6:24 PM   Addendum -HL therapeutic -Continue current rate -Check confirmatory level in AM  Harvel Quale  07/25/2016 1:38 AM

## 2016-07-24 NOTE — Discharge Summary (Signed)
[place] [facility] [place] NAME: Nicholas Torres    MR#:  [id number]  DATE OF BIRTH:  [birth year]  DATE OF ADMISSION:  07/23/2016 ADMITTING PHYSICIAN: Demetrios Loll, MD  DATE OF DISCHARGE: No discharge date for patient encounter.  PRIMARY CARE PHYSICIAN: GUEST, Veneda Melter, MD    ADMISSION DIAGNOSIS:  NSTEMI (non-ST elevated myocardial infarction) ([place]) [I21.4] Chest pain, unspecified chest pain type [R07.9]  DISCHARGE DIAGNOSIS:  Active Problems:   NSTEMI (non-ST elevated myocardial infarction) ([place])   SECONDARY DIAGNOSIS:   Past Medical History:  Diagnosis Date  . Allergy   . High cholesterol   . Hypertension     HOSPITAL COURSE:   55 year old male with past medical history of hypertension, hyperlipidemia who presented to the hospital with exertional shortness of breath and not have an elevated troponin.  1. Non-ST elevation MI-patient ruled in by cardiac markers. His troponins peaked as high as 5. -He did not have any chest pain but most of his symptoms were exertional dyspnea. He was seen by cardiology and underwent cardiac catheterization which showed significant three-vessel coronary artery disease. -He is being discharged to [facility] for evaluation for possible coronary artery bypass graft surgery. -He is clinically symptomatically now. He will continue aspirin, Lovenox, metoprolol, atorvastatin.  2. Essential hypertension-patient will continue metoprolol.  3. Hyperlipidemia-patient will be started on high dose intensity statin given his NSTEMI.    DISCHARGE CONDITIONS:   Stable.   CONSULTS OBTAINED:  Treatment Team:  Dionisio David, MD Melrose Nakayama, MD  DRUG ALLERGIES:  No Known Allergies  DISCHARGE MEDICATIONS:     Medication List    STOP taking these medications   atenolol 25 MG tablet Commonly known as:  TENORMIN   rosuvastatin 10 MG tablet Commonly known as:  CRESTOR     TAKE these medications    aspirin EC 81 MG tablet Take 1 tablet (81 mg total) by mouth daily.   atorvastatin 80 MG tablet Commonly known as:  LIPITOR Take 1 tablet (80 mg total) by mouth daily at 6 PM.   enoxaparin 150 MG/ML injection Commonly known as:  LOVENOX Inject 0.58 mLs (85 mg total) into the skin 2 (two) times daily.   fluticasone 50 MCG/ACT nasal spray Commonly known as:  FLONASE Place 1 spray into both nostrils at bedtime as needed for rhinitis.   meloxicam 15 MG tablet Commonly known as:  MOBIC Take 1 tablet (15 mg total) by mouth daily.   metoprolol tartrate 25 MG tablet Commonly known as:  LOPRESSOR Take 1 tablet (25 mg total) by mouth 2 (two) times daily.   sildenafil 50 MG tablet Commonly known as:  VIAGRA Take 1 tablet (50 mg total) by mouth daily as needed for erectile dysfunction.         DISCHARGE INSTRUCTIONS:   DIET:  Cardiac diet  DISCHARGE CONDITION:  Stable  ACTIVITY:  Activity as tolerated  OXYGEN:  Home Oxygen: No.   Oxygen Delivery: room air  DISCHARGE LOCATION:  [facility].    If you experience worsening of your admission symptoms, develop shortness of breath, life threatening emergency, suicidal or homicidal thoughts you must seek medical attention immediately by calling 911 or calling your MD immediately  if symptoms less severe.  You Must read complete instructions/literature along with all the possible adverse reactions/side effects for all the Medicines you take and that have been prescribed to you. Take any new Medicines after you have completely understood and  accpet all the possible adverse reactions/side effects.   Please note  You were cared for by a hospitalist during your hospital stay. If you have any questions about your discharge medications or the care you received while you were in the hospital after you are discharged, you can call the unit and asked to speak with the hospitalist on call if the hospitalist that took care of you  is not available. Once you are discharged, your primary care physician will handle any further medical issues. Please note that NO REFILLS for any discharge medications will be authorized once you are discharged, as it is imperative that you return to your primary care physician (or establish a relationship with a primary care physician if you do not have one) for your aftercare needs so that they can reassess your need for medications and monitor your lab values.     Today   Currently CP Free. No shortness of breath.    VITAL SIGNS:  Blood pressure (!) 145/93, pulse (!) 50, temperature 98 F (36.7 C), temperature source Oral, resp. rate 20, height 5\' 5"  (1.651 m), weight 84.4 kg (186 lb 1.6 oz), SpO2 100 %.  I/O:   Intake/Output Summary (Last 24 hours) at 07/24/16 1206 Last data filed at 07/24/16 0537  Gross per 24 hour  Intake                0 ml  Output              575 ml  Net             -575 ml    PHYSICAL EXAMINATION:  GENERAL:  55 y.o.-year-old patient lying in the bed with no acute distress.  EYES: Pupils equal, round, reactive to light and accommodation. No scleral icterus. Extraocular muscles intact.  HEENT: Head atraumatic, normocephalic. Oropharynx and nasopharynx clear.  NECK:  Supple, no jugular venous distention. No thyroid enlargement, no tenderness.  LUNGS: Normal breath sounds bilaterally, no wheezing, rales,rhonchi. No use of accessory muscles of respiration.  CARDIOVASCULAR: S1, S2 normal. No murmurs, rubs, or gallops.  ABDOMEN: Soft, non-tender, non-distended. Bowel sounds present. No organomegaly or mass.  EXTREMITIES: No pedal edema, cyanosis, or clubbing.  NEUROLOGIC: Cranial nerves II through XII are intact. No focal motor or sensory defecits b/l.  PSYCHIATRIC: The patient is alert and oriented x 3. Good affect.  SKIN: No obvious rash, lesion, or ulcer.   DATA REVIEW:   CBC  Recent Labs Lab 07/24/16 0612  WBC 6.7  HGB 14.4  HCT 42.4  PLT 137*     Chemistries   Recent Labs Lab 07/23/16 0929 07/24/16 0612  NA 137 138  K 3.8 4.1  CL 105 106  CO2 25 27  GLUCOSE 97 100*  BUN 16 14  CREATININE 1.06 1.22  CALCIUM 9.3 9.0  AST 32  --   ALT 24  --   ALKPHOS 78  --   BILITOT 0.6  --     Cardiac Enzymes  Recent Labs Lab 07/23/16 2139  TROPONINI 5.27*    Microbiology Results  No results found for this or any previous visit.  RADIOLOGY:  Dg Chest 2 View  Result Date: 07/23/2016 CLINICAL DATA:  Chest pain for 2 weeks. EXAM: CHEST  2 VIEW COMPARISON:  None. FINDINGS: The heart size and mediastinal contours are within normal limits. Both lungs are clear. No pneumothorax or pleural effusion is noted. The visualized skeletal structures are unremarkable. IMPRESSION: No active cardiopulmonary disease. Electronically  Signed   By: Marijo Conception, M.D.   On: 07/23/2016 09:45      Management plans discussed with the patient, family and they are in agreement.  CODE STATUS:     Code Status Orders        Start     Ordered   07/23/16 1252  Full code  Continuous     07/23/16 1251    Code Status History    Date Active Date Inactive Code Status Order ID Comments User Context   This patient has a current code status but no historical code status.      TOTAL TIME TAKING CARE OF THIS PATIENT: 40 minutes.    Henreitta Leber M.D on 07/24/2016 at 12:06 PM  Between 7am to 6pm - Pager - [phone]  After 6pm go to [email or web address] - [employer] [facility]  Office  [phone]  CC: Primary care physician; Kennon Portela, MD

## 2016-07-24 NOTE — Progress Notes (Signed)
Wrong pt

## 2016-07-24 NOTE — H&P (Signed)
[address]       [place] [address]             [phone]        Zayven Pharris [place] Medical Record [id number] Date of Birth: Jul 07, 1961  Referring: Dr. Chancy Milroy Primary Care: Kennon Portela, MD  Chief Complaint: CAD  History of Present Illness:      Mr. Reierson is a 55 yo African American Male  with a known history of HTN and Hyperlipidemia.  He presented to who presented to [facility] ED with complaints of intermittent exertional chest discomfort with associated shortness of breath that has been occurring over the past 2 weeks.  EKG showed some ST-T depression.  His troponin was elevated and he was admitted for further work up.  Cardiology consult was obtained and he was ultimately taken to the cardiac catheterization lab where he was found to have multivessel CAD.  It was felt coronary bypass grafting would be indicated and he was transferred to [facility] for surgical evaluation.  Currently the patient is chest pain free.  He denies history of DM and nicotine abuse.  He has a positive family history of CAD.  He is physically active and is able to perform his daily activities without difficulty  Current Activity/ Functional Status: Patient is independent with mobility/ambulation, transfers, ADL's, IADL's.   Zubrod Score: At the time of surgery this patient's most appropriate activity status/level should be described as: []     0    Normal activity, no symptoms [x]     1    Restricted in physical strenuous activity but ambulatory, able to do out light work []     2    Ambulatory and capable of self care, unable to do work activities, up and about                 more than 50%  Of the time                            []     3    Only limited self care, in bed greater than 50% of waking hours []     4    Completely disabled, no self care, confined to bed or chair []     5    Moribund  Past Medical History:  Diagnosis Date  . Allergy   . High cholesterol     . Hypertension     Past Surgical History:  Procedure Laterality Date  . CARDIAC CATHETERIZATION Right 07/24/2016   Procedure: Left Heart Cath and Coronary Angiography;  Surgeon: Dionisio David, MD;  Location: [facility];  Service: Cardiovascular;  Laterality: Right;  . no prior surgery      History  Smoking Status  . Never Smoker  Smokeless Tobacco  . Never Used    History  Alcohol Use No    Social History   Social History  . Marital status: Single    Spouse name: N/A  . Number of children: N/A  . Years of education: N/A   Occupational History  . Not on file.   Social History Main Topics  . Smoking status: Never Smoker  . Smokeless tobacco: Never Used  . Alcohol use No  . Drug use: No  . Sexual activity: Not on file   Other Topics Concern  . Not on file   Social History Narrative  . No  narrative on file    No Known Allergies  Current Facility-Administered Medications  Medication Dose Route Frequency Provider Last Rate Last Dose  . 0.9 %  sodium chloride infusion  250 mL Intravenous PRN Dionisio David, MD      . 0.9% sodium chloride infusion  1 mL/kg/hr Intravenous Continuous Dionisio David, MD      . acetaminophen (TYLENOL) tablet 650 mg  650 mg Oral Q4H PRN Dionisio David, MD      . ondansetron [facility]) injection 4 mg  4 mg Intravenous Q6H PRN Dionisio David, MD      . sodium chloride flush (NS) 0.9 % injection 3 mL  3 mL Intravenous Q12H Dionisio David, MD      . sodium chloride flush (NS) 0.9 % injection 3 mL  3 mL Intravenous PRN Dionisio David, MD        Prescriptions Prior to Admission  Medication Sig Dispense Refill Last Dose  . aspirin EC 81 MG tablet Take 1 tablet (81 mg total) by mouth daily.   07/23/2016 at Unknown time  . atenolol (TENORMIN) 25 MG tablet Take 25 mg by mouth daily.   Past Week at Unknown time  . fluticasone (FLONASE) 50 MCG/ACT nasal spray Place 1 spray into both nostrils at bedtime as needed for rhinitis.   Past Week  at Unknown time  . ibuprofen (ADVIL,MOTRIN) 200 MG tablet Take 200 mg by mouth every 6 (six) hours as needed for moderate pain.   Past Month at Unknown time  . rosuvastatin (CRESTOR) 10 MG tablet Take 10 mg by mouth daily.   Past Week at Unknown time  . sildenafil (VIAGRA) 50 MG tablet Take 1 tablet (50 mg total) by mouth daily as needed for erectile dysfunction. 12 tablet 3 Past Month at Unknown time  . atorvastatin (LIPITOR) 80 MG tablet Take 1 tablet (80 mg total) by mouth daily at 6 PM.     . enoxaparin (LOVENOX) 150 MG/ML injection Inject 0.58 mLs (85 mg total) into the skin 2 (two) times daily. 0 Syringe    . meloxicam (MOBIC) 15 MG tablet Take 1 tablet (15 mg total) by mouth daily. (Patient not taking: Reported on 07/24/2016) 30 tablet 0 Not Taking at Unknown time  . metoprolol tartrate (LOPRESSOR) 25 MG tablet Take 1 tablet (25 mg total) by mouth 2 (two) times daily. (Patient not taking: Reported on 07/24/2016)   Not Taking at Unknown time    Family History  Problem Relation Age of Onset  . Diabetes Mother   . Heart attack Father   . CAD Brother     Review of Systems:  Constitutional: negative Respiratory: positive for shortness of breath with episodes of chest pain Cardiovascular: positive for chest pain Gastrointestinal: negative Neurological: negative Endocrine: negative, negative for DM per patient     Cardiac Review of Systems: Y or N  Chest Pain [ y   ]  Resting SOB [   ] Exertional SOB  Blue.Reese  ]  Orthopnea [  ]   Pedal Edema [   ]    Palpitations [n  ] Syncope  [  ]   Presyncope [   ]  General Review of Systems: [Y] = yes [  ]=no Constitional: recent weight change [  ]; anorexia [  ]; fatigue [  ]; nausea [  ]; night sweats [  ]; fever [  ]; or chills [  ]  Dental: poor dentition[  ]; Last Dentist visit:   Eye : blurred vision [  ]; diplopia [   ]; vision changes [  ];  Amaurosis fugax[  ]; Resp: cough [  ];   wheezing[  ];  hemoptysis[  ]; shortness of breath[ y ]; paroxysmal nocturnal dyspnea[  ]; dyspnea on exertion[  ]; or orthopnea[  ];  GI:  gallstones[  ], vomiting[  ];  dysphagia[  ]; melena[  ];  hematochezia [  ]; heartburn[  ];   Hx of  Colonoscopy[  ]; GU: kidney stones [  ]; hematuria[  ];   dysuria [  ];  nocturia[  ];  history of     obstruction [  ]; urinary frequency [  ]             Skin: rash, swelling[ n ];, hair loss[  ];  peripheral edema[  ];  or itching[  ]; Musculosketetal: myalgias[  ];  joint swelling[  ];  joint erythema[  ];  joint pain[  ];  back pain[  ];  Heme/Lymph: bruising[  ];  bleeding[  ];  anemia[  ];  Neuro: TIA[  ];  headaches[  ];  stroke[  ];  vertigo[  ];  seizures[  ];   paresthesias[  ];  difficulty walking[  ];  Psych:depression[  ]; anxiety[  ];  Endocrine: diabetes[n  ];  thyroid dysfunction[  ];  Immunizations: Flu [  ]; Pneumococcal[  ];  Other:  Physical Exam: BP (!) 136/96 (BP Location: Left Arm)   Pulse (!) 56   Temp 99.1 F (37.3 C) (Oral)   Resp 18   SpO2 99%   General appearance: alert, cooperative and no distress Head: Normocephalic, without obvious abnormality, atraumatic Neck: no adenopathy, no carotid bruit, no JVD, supple, symmetrical, trachea midline and thyroid not enlarged, symmetric, no tenderness/mass/nodules Lymph nodes: Cervical, supraclavicular, and axillary nodes normal. Resp: clear to auscultation bilaterally Cardio: regular rate and rhythm GI: soft, non-tender; bowel sounds normal; no masses,  no organomegaly Extremities: extremities normal, atraumatic, no cyanosis or edema Neurologic: Grossly normal  Diagnostic Studies & Laboratory data:     Recent Radiology Findings:   Dg Chest 2 View  Result Date: 07/23/2016 CLINICAL DATA:  Chest pain for 2 weeks. EXAM: CHEST  2 VIEW COMPARISON:  None. FINDINGS: The heart size and mediastinal contours are within normal limits. Both lungs are clear. No pneumothorax or pleural  effusion is noted. The visualized skeletal structures are unremarkable. IMPRESSION: No active cardiopulmonary disease. Electronically Signed   By: Marijo Conception, M.D.   On: 07/23/2016 09:45     I have independently reviewed the above radiologic studies.  Recent Lab Findings: Lab Results  Component Value Date   WBC 6.7 07/24/2016   HGB 14.4 07/24/2016   HCT 42.4 07/24/2016   PLT 137 (L) 07/24/2016   GLUCOSE 100 (H) 07/24/2016   CHOL 165 07/24/2016   TRIG 86 07/24/2016   HDL 44 07/24/2016   LDLCALC 104 (H) 07/24/2016   ALT 24 07/23/2016   AST 32 07/23/2016   NA 138 07/24/2016   K 4.1 07/24/2016   CL 106 07/24/2016   CREATININE 1.22 07/24/2016   BUN 14 07/24/2016   CO2 27 07/24/2016   TSH 1.329 07/10/2013   INR 1.11 07/23/2016   HGBA1C 5.5 07/10/2013    Assessment / Plan:      1. CAD- currently chest pain free, needs CABG 2. CV- Bradycardia- will  hold beta blocker for now 3. Hyperlipidemia- will restart Crestor 4. Dispo- patient currently chest pain free, complete Pre-operative workup, including Allens test... Tentative for CABG on Wednesday      @ME1 @ 07/24/2016 4:13 PM  Patient seen and examined, agree with above 51 yo man with strong family history of CAD, hypertension and hyperlipidemia who presents with new onset angina. At cath has severe 3 vessel CAD no amenable to PCI. CABG indicated for survival benefit and relief of symptoms.  I have discussed the general nature of the procedure, the need for general anesthesia, the use of cardiopulmonary bypass, and the incisions to be used with Mr and Mrs. Kamen. I reviewed the expected hospital stay, overall recovery and short and long term outcomes. I informed them of the indications, risks, benefits and alternatives. They understand the risks include but are not limited to death, stroke, MI, DVT/PE, bleeding, possible need for transfusion, infections, cardiac arrhythmias, as well as other organ system dysfunction including  respiratory, renal, or GI complications.   He accepts the risks and agrees to proceed.  Plan CABG on Wed 9/13  Will start heparin drip  Remo Lipps C. Roxan Hockey, MD [facility] [phone]

## 2016-07-24 NOTE — Progress Notes (Signed)
Error - wrong pt

## 2016-07-25 ENCOUNTER — Inpatient Hospital Stay (HOSPITAL_COMMUNITY): Payer: BLUE CROSS/BLUE SHIELD

## 2016-07-25 ENCOUNTER — Encounter (HOSPITAL_COMMUNITY): Payer: BLUE CROSS/BLUE SHIELD

## 2016-07-25 DIAGNOSIS — Z01810 Encounter for preprocedural cardiovascular examination: Secondary | ICD-10-CM

## 2016-07-25 DIAGNOSIS — I2510 Atherosclerotic heart disease of native coronary artery without angina pectoris: Secondary | ICD-10-CM

## 2016-07-25 LAB — PULMONARY FUNCTION TEST
DL/VA % pred: 101 %
DL/VA: 4.37 ml/min/mmHg/L
DLCO UNC: 19.7 ml/min/mmHg
DLCO cor % pred: 77 %
DLCO cor: 19.93 ml/min/mmHg
DLCO unc % pred: 76 %
FEF 25-75 Post: 2.81 L/sec
FEF 25-75 Pre: 2.63 L/sec
FEF2575-%Change-Post: 6 %
FEF2575-%Pred-Post: 101 %
FEF2575-%Pred-Pre: 95 %
FEV1-%CHANGE-POST: 3 %
FEV1-%PRED-POST: 95 %
FEV1-%Pred-Pre: 92 %
FEV1-POST: 2.6 L
FEV1-PRE: 2.52 L
FEV1FVC-%Change-Post: 1 %
FEV1FVC-%Pred-Pre: 98 %
FEV6-%Change-Post: 4 %
FEV6-%PRED-PRE: 94 %
FEV6-%Pred-Post: 98 %
FEV6-POST: 3.25 L
FEV6-Pre: 3.11 L
FEV6FVC-%PRED-POST: 104 %
FEV6FVC-%Pred-Pre: 104 %
FVC-%CHANGE-POST: 2 %
FVC-%PRED-PRE: 92 %
FVC-%Pred-Post: 94 %
FVC-POST: 3.25 L
FVC-PRE: 3.18 L
POST FEV6/FVC RATIO: 100 %
PRE FEV6/FVC RATIO: 100 %
Post FEV1/FVC ratio: 80 %
Pre FEV1/FVC ratio: 79 %
RV % PRED: 155 %
RV: 2.83 L
TLC % PRED: 104 %
TLC: 6.2 L

## 2016-07-25 LAB — CBC
HEMATOCRIT: 43.5 % (ref 39.0–52.0)
HEMOGLOBIN: 14.2 g/dL (ref 13.0–17.0)
MCH: 30.8 pg (ref 26.0–34.0)
MCHC: 32.6 g/dL (ref 30.0–36.0)
MCV: 94.4 fL (ref 78.0–100.0)
Platelets: 159 10*3/uL (ref 150–400)
RBC: 4.61 MIL/uL (ref 4.22–5.81)
RDW: 12.6 % (ref 11.5–15.5)
WBC: 8.5 10*3/uL (ref 4.0–10.5)

## 2016-07-25 LAB — VAS US DOPPLER PRE CABG
LEFT ECA DIAS: -16 cm/s
LEFT VERTEBRAL DIAS: 9 cm/s
LICADDIAS: -35 cm/s
LICAPDIAS: -19 cm/s
LICAPSYS: -46 cm/s
Left CCA dist dias: -26 cm/s
Left CCA dist sys: -62 cm/s
Left CCA prox dias: 26 cm/s
Left CCA prox sys: 77 cm/s
Left ICA dist sys: -66 cm/s
RIGHT ECA DIAS: -13 cm/s
RIGHT VERTEBRAL DIAS: 8 cm/s
Right CCA prox dias: 11 cm/s
Right CCA prox sys: 56 cm/s
Right cca dist sys: -60 cm/s

## 2016-07-25 LAB — URINALYSIS, ROUTINE W REFLEX MICROSCOPIC
Bilirubin Urine: NEGATIVE
Glucose, UA: NEGATIVE mg/dL
Hgb urine dipstick: NEGATIVE
Ketones, ur: 15 mg/dL — AB
LEUKOCYTES UA: NEGATIVE
NITRITE: NEGATIVE
PH: 5.5 (ref 5.0–8.0)
Protein, ur: NEGATIVE mg/dL
SPECIFIC GRAVITY, URINE: 1.01 (ref 1.005–1.030)

## 2016-07-25 LAB — HEPARIN LEVEL (UNFRACTIONATED)
HEPARIN UNFRACTIONATED: 0.63 [IU]/mL (ref 0.30–0.70)
Heparin Unfractionated: 0.83 IU/mL — ABNORMAL HIGH (ref 0.30–0.70)
Heparin Unfractionated: 0.63 IU/mL (ref 0.30–0.70)

## 2016-07-25 LAB — TYPE AND SCREEN
ABO/RH(D): O POS
ANTIBODY SCREEN: NEGATIVE

## 2016-07-25 LAB — BLOOD GAS, ARTERIAL
Acid-Base Excess: 0.3 mmol/L (ref 0.0–2.0)
BICARBONATE: 24.2 mmol/L (ref 20.0–28.0)
Drawn by: 301361
FIO2: 21
O2 Saturation: 95.1 %
PATIENT TEMPERATURE: 98.6
PO2 ART: 81.8 mmHg — AB (ref 83.0–108.0)
pCO2 arterial: 37.4 mmHg (ref 32.0–48.0)
pH, Arterial: 7.426 (ref 7.350–7.450)

## 2016-07-25 LAB — COMPREHENSIVE METABOLIC PANEL
ALBUMIN: 3.9 g/dL (ref 3.5–5.0)
ALT: 23 U/L (ref 17–63)
ANION GAP: 9 (ref 5–15)
AST: 55 U/L — AB (ref 15–41)
Alkaline Phosphatase: 78 U/L (ref 38–126)
BILIRUBIN TOTAL: 0.7 mg/dL (ref 0.3–1.2)
BUN: 11 mg/dL (ref 6–20)
CO2: 27 mmol/L (ref 22–32)
Calcium: 9.6 mg/dL (ref 8.9–10.3)
Chloride: 101 mmol/L (ref 101–111)
Creatinine, Ser: 1.21 mg/dL (ref 0.61–1.24)
GFR calc Af Amer: >60 mL/min (ref >60)
GFR calc non Af Amer: >60 mL/min (ref >60)
GLUCOSE: 94 mg/dL (ref 65–99)
POTASSIUM: 3.8 mmol/L (ref 3.5–5.1)
SODIUM: 137 mmol/L (ref 135–145)
Total Protein: 7.7 g/dL (ref 6.5–8.1)

## 2016-07-25 LAB — SURGICAL PCR SCREEN
MRSA, PCR: NEGATIVE
STAPHYLOCOCCUS AUREUS: NEGATIVE

## 2016-07-25 LAB — PROTIME-INR
INR: 1.17
Prothrombin Time: 15 seconds (ref 11.4–15.2)

## 2016-07-25 LAB — ABO/RH: ABO/RH(D): O POS

## 2016-07-25 MED ORDER — POTASSIUM CHLORIDE 2 MEQ/ML IV SOLN
80.0000 meq | INTRAVENOUS | Status: DC
  Filled 2016-07-25: qty 40

## 2016-07-25 MED ORDER — SODIUM CHLORIDE 0.9 % IV SOLN
INTRAVENOUS | Status: AC
  Administered 2016-07-26: .8 [IU]/h via INTRAVENOUS
  Filled 2016-07-25: qty 2.5

## 2016-07-25 MED ORDER — DEXMEDETOMIDINE HCL IN NACL 400 MCG/100ML IV SOLN
0.1000 ug/kg/h | INTRAVENOUS | Status: AC
  Administered 2016-07-26: .2 ug/kg/h via INTRAVENOUS
  Filled 2016-07-25: qty 100

## 2016-07-25 MED ORDER — ALBUTEROL SULFATE (2.5 MG/3ML) 0.083% IN NEBU
2.5000 mg | INHALATION_SOLUTION | Freq: Once | RESPIRATORY_TRACT | Status: AC
  Administered 2016-07-25: 2.5 mg via RESPIRATORY_TRACT

## 2016-07-25 MED ORDER — TEMAZEPAM 15 MG PO CAPS: 15.0000 mg | ORAL_CAPSULE | Freq: Once | ORAL | Status: DC | PRN

## 2016-07-25 MED ORDER — METOPROLOL TARTRATE 12.5 MG HALF TABLET
12.5000 mg | ORAL_TABLET | Freq: Once | ORAL | Status: AC
  Administered 2016-07-26: 12.5 mg via ORAL
  Filled 2016-07-25: qty 1

## 2016-07-25 MED ORDER — VANCOMYCIN HCL 10 G IV SOLR
1500.0000 mg | INTRAVENOUS | Status: AC
  Administered 2016-07-26: 1500 mg via INTRAVENOUS
  Filled 2016-07-25: qty 1500

## 2016-07-25 MED ORDER — CHLORHEXIDINE GLUCONATE 0.12 % MT SOLN: 15.0000 mL | Freq: Once | OROMUCOSAL | Status: DC

## 2016-07-25 MED ORDER — MAGNESIUM HYDROXIDE 400 MG/5ML PO SUSP: 30.0000 mL | Freq: Every day | ORAL | Status: DC | PRN

## 2016-07-25 MED ORDER — PHENYLEPHRINE HCL 10 MG/ML IJ SOLN
30.0000 ug/min | INTRAVENOUS | Status: AC
  Administered 2016-07-26: 20 ug/min via INTRAVENOUS
  Filled 2016-07-25: qty 2

## 2016-07-25 MED ORDER — BISACODYL 5 MG PO TBEC
5.0000 mg | DELAYED_RELEASE_TABLET | Freq: Once | ORAL | Status: AC
  Administered 2016-07-25: 5 mg via ORAL
  Filled 2016-07-25: qty 1

## 2016-07-25 MED ORDER — DIAZEPAM 5 MG PO TABS: 5.0000 mg | ORAL_TABLET | Freq: Once | ORAL | Status: DC

## 2016-07-25 MED ORDER — DEXTROSE 5 % IV SOLN
750.0000 mg | INTRAVENOUS | Status: DC
  Filled 2016-07-25: qty 750

## 2016-07-25 MED ORDER — SODIUM CHLORIDE 0.9 % IV SOLN
INTRAVENOUS | Status: AC
  Administered 2016-07-26: 69.8 mL/h via INTRAVENOUS
  Filled 2016-07-25: qty 40

## 2016-07-25 MED ORDER — MAGNESIUM SULFATE 50 % IJ SOLN
40.0000 meq | INTRAMUSCULAR | Status: DC
  Filled 2016-07-25: qty 10

## 2016-07-25 MED ORDER — CHLORHEXIDINE GLUCONATE CLOTH 2 % EX PADS: 6.0000 | MEDICATED_PAD | Freq: Once | CUTANEOUS | Status: DC

## 2016-07-25 MED ORDER — SODIUM CHLORIDE 0.9 % IV SOLN
INTRAVENOUS | Status: DC
  Filled 2016-07-25: qty 30

## 2016-07-25 MED ORDER — DEXTROSE 5 % IV SOLN
1.5000 g | INTRAVENOUS | Status: AC
  Administered 2016-07-26: .75 g via INTRAVENOUS
  Administered 2016-07-26: 1.5 g via INTRAVENOUS
  Filled 2016-07-25: qty 1.5

## 2016-07-25 MED ORDER — EPINEPHRINE HCL 1 MG/ML IJ SOLN
0.0000 ug/min | INTRAVENOUS | Status: DC
  Filled 2016-07-25: qty 4

## 2016-07-25 MED ORDER — PLASMA-LYTE 148 IV SOLN
INTRAVENOUS | Status: AC
  Administered 2016-07-26: 500 mL
  Filled 2016-07-25: qty 2.5

## 2016-07-25 MED ORDER — NITROGLYCERIN IN D5W 200-5 MCG/ML-% IV SOLN
2.0000 ug/min | INTRAVENOUS | Status: DC
  Administered 2016-07-26: 5 ug/min via INTRAVENOUS
  Filled 2016-07-25: qty 250

## 2016-07-25 MED ORDER — DOPAMINE-DEXTROSE 3.2-5 MG/ML-% IV SOLN
0.0000 ug/kg/min | INTRAVENOUS | Status: DC
  Filled 2016-07-25: qty 250

## 2016-07-25 NOTE — Progress Notes (Signed)
Patient has been given cardiac surgery booklet and information for viewing preparing for heart surgery video, patient also has been given and reviewed with patient and significant other. Reynoldo Mainer, Bettina Gavia RN

## 2016-07-25 NOTE — Anesthesia Preprocedure Evaluation (Addendum)
Anesthesia Evaluation  Patient identified by MRN, date of birth, ID band Patient awake    Reviewed: Allergy & Precautions, NPO status , Patient's Chart, lab work & pertinent test results, reviewed documented beta blocker date and time   Airway Mallampati: II  TM Distance: >3 FB Neck ROM: Full    Dental no notable dental hx. (+) Dental Advisory Given, Teeth Intact   Pulmonary neg pulmonary ROS,    Pulmonary exam normal breath sounds clear to auscultation       Cardiovascular hypertension, Pt. on medications and Pt. on home beta blockers + CAD and + Past MI   Rhythm:Regular Rate:Normal     Neuro/Psych negative neurological ROS  negative psych ROS   GI/Hepatic negative GI ROS, Neg liver ROS,   Endo/Other  negative endocrine ROS  Renal/GU negative Renal ROS  negative genitourinary   Musculoskeletal negative musculoskeletal ROS (+)   Abdominal   Peds negative pediatric ROS (+)  Hematology negative hematology ROS (+)   Anesthesia Other Findings Denies esophageal pathology or gastric ulcers  Reproductive/Obstetrics negative OB ROS                            Lab Results  Component Value Date   WBC 8.5 07/25/2016   HGB 14.2 07/25/2016   HCT 43.5 07/25/2016   MCV 94.4 07/25/2016   PLT 159 07/25/2016   Lab Results  Component Value Date   CREATININE 1.22 07/24/2016   BUN 14 07/24/2016   NA 138 07/24/2016   K 4.1 07/24/2016   CL 106 07/24/2016   CO2 27 07/24/2016   Lab Results  Component Value Date   INR 1.11 07/23/2016   07/2016 EKG: sinus bradycardia.  Anesthesia Physical Anesthesia Plan  ASA: IV  Anesthesia Plan: General   Post-op Pain Management:    Induction: Intravenous  Airway Management Planned: Oral ETT  Additional Equipment: Arterial line, CVP, PA Cath, Ultrasound Guidance Line Placement and TEE  Intra-op Plan:   Post-operative Plan: Post-operative  intubation/ventilation  Informed Consent: I have reviewed the patients History and Physical, chart, labs and discussed the procedure including the risks, benefits and alternatives for the proposed anesthesia with the patient or authorized representative who has indicated his/her understanding and acceptance.   Dental advisory given  Plan Discussed with: CRNA  Anesthesia Plan Comments:        Anesthesia Quick Evaluation

## 2016-07-25 NOTE — Progress Notes (Signed)
[id number] Discussed with pt how important it is to walk and use IS after surgery. Discussed sternal precautions and demonstrated how to get up and down without use of arms. Pt demonstrated correct use of IS. Knows he will need someone with him 24/7 first week after discharge. Stated he has family available. Wrote down how to view pre op video. Pt unsure if he wants to watch. Gave care guide and he has [facility] booklet. Graylon Good RN BSN 07/25/2016 1:38 PM

## 2016-07-25 NOTE — Progress Notes (Signed)
Pre-op Cardiac Surgery  Carotid Findings:  There is no obvious evidence of hemodynamically significant internal carotid artery stenosis bilaterally. Vertebral arteries are patent with antegrade flow.   Upper Extremity Right Left  Brachial Pressures Triphasic 130-Triphasic  Radial Waveforms Triphasic Triphasic  Ulnar Waveforms Triphasic Triphasic  Palmar Arch (Allen's Test) Within normal limits Within normal limits     Lower  Extremity Right Left  Dorsalis Pedis 158-Triphasic 150-Triphasic  Anterior Tibial    Posterior Tibial 165-Triphasic 163-Triphasic  Ankle/Brachial Indices 1.27 1.25    Findings:   ABIs are within normal limits.  07/25/2016 1:21 PM Nicholas Torres, BS, RVT, RDCS, RDMS

## 2016-07-25 NOTE — Progress Notes (Signed)
RT went to see patient to draw ABG. Patient was not in the room he was gone for his PFT and to echo. RT will try back shortly.

## 2016-07-25 NOTE — Progress Notes (Signed)
ANTICOAGULATION CONSULT NOTE - Initial Consult  Pharmacy Consult for  Heparin Indication: NSTEMI, CAD awaiting CABG  Allergies  Allergen Reactions  . No Known Allergies     Patient Measurements: Height: 5\' 5"  (165.1 cm) Weight: 186 lb 1.6 oz (84.4 kg) IBW/kg (Calculated) : 61.5 Heparin Dosing Weight: 79 kg  Vital Signs: Temp: 98.9 F (37.2 C) (09/12 2000) Temp Source: Oral (09/12 2000) BP: 116/85 (09/12 2000) Pulse Rate: 57 (09/12 2000)  Labs:  Recent Labs  07/23/16 0929 07/23/16 1525 07/23/16 2139 07/24/16 0612 07/25/16 0047 07/25/16 0852 07/25/16 1227 07/25/16 1812  HGB 14.9  --   --  14.4 14.2  --   --   --   HCT 44.2  --   --  42.4 43.5  --   --   --   PLT 133*  --   --  137* 159  --   --   --   APTT 31  --   --   --   --   --   --   --   LABPROT 14.4  --   --   --   --   --  15.0  --   INR 1.11  --   --   --   --   --  1.17  --   HEPARINUNFRC  --   --   --   --  0.63 0.83*  --  0.63  CREATININE 1.06  --   --  1.22  --   --  1.21  --   TROPONINI 0.05* 1.22* 5.27*  --   --   --   --   --     Estimated Creatinine Clearance: 71.4 mL/min (by C-G formula based on SCr of 1.21 mg/dL).   Medical History: Past Medical History:  Diagnosis Date  . High cholesterol   . Hypertension   . Pollen allergy    Assessment:   55 yr old male transferred from [facility] to [facility] post-cath today.  Multivessel CAD, for CABG on 07/26/16.    Had been on Lovenox for NSTEMI at [facility], last dose 9/10 at 11:22pm.    Groin site noted without bleeding or hematoma post-cath; closure device place at 10:08am on 9/11.    Heparin level therapeutic tonight at 0.63 after rate decreased to 950 units/hr.  No bleeding reported.  Drip to stop at 4am on 9/13 pre--op.  Goal of Therapy:  Heparin level 0.3-0.7 units/ml Monitor platelets by anticoagulation protocol: Yes   Plan:  continue heparin drip at 950 units/hr until  Discontinue drip at 4am on 9/13 pre-op.  Eudelia Bunch,  Pharm.D. [phone] 07/25/2016 9:14 PM

## 2016-07-25 NOTE — Progress Notes (Signed)
ANTICOAGULATION CONSULT NOTE - Initial Consult  Pharmacy Consult for  Heparin Indication: NSTEMI, CAD awaiting CABG  Allergies  Allergen Reactions  . No Known Allergies     Patient Measurements: Height: 5\' 5"  (165.1 cm) Weight: 186 lb 1.6 oz (84.4 kg) IBW/kg (Calculated) : 61.5 Heparin Dosing Weight: 79 kg  Vital Signs: Temp: 98.6 F (37 C) (09/12 0422) Temp Source: Oral (09/12 0422) BP: 115/84 (09/12 0422) Pulse Rate: 81 (09/12 0422)  Labs:  Recent Labs  07/23/16 0929 07/23/16 1525 07/23/16 2139 07/24/16 0612 07/25/16 0047 07/25/16 0852  HGB 14.9  --   --  14.4 14.2  --   HCT 44.2  --   --  42.4 43.5  --   PLT 133*  --   --  137* 159  --   APTT 31  --   --   --   --   --   LABPROT 14.4  --   --   --   --   --   INR 1.11  --   --   --   --   --   HEPARINUNFRC  --   --   --   --  0.63 0.83*  CREATININE 1.06  --   --  1.22  --   --   TROPONINI 0.05* 1.22* 5.27*  --   --   --     Estimated Creatinine Clearance: 70.8 mL/min (by C-G formula based on SCr of 1.22 mg/dL).   Medical History: Past Medical History:  Diagnosis Date  . High cholesterol   . Hypertension   . Pollen allergy    Assessment:   55 yr old male transferred from [facility] to [facility] post-cath today.  Multivessel CAD, for CABG on 07/26/16.    Had been on Lovenox for NSTEMI at [facility], last dose 9/10 at 11:22pm.    Groin site noted without bleeding or hematoma post-cath; closure device place at 10:08am on 9/11.     Initial heparin level 0.63 (therapeutic) but up to 0.83 this am on 1100 units/hr. No bleeding reported.  Drip to stop at 4am on 9/13 pre--op.  Goal of Therapy:  Heparin level 0.3-0.7 units/ml Monitor platelets by anticoagulation protocol: Yes   Plan:   Decrease heparin drip to 950 units/hr.  Heparin level ~6hrs after drip begins.  Discontinue drip at 4am on 9/13 pre-op.  Daily heparin level and CBC while on heparin.  Arty Baumgartner, [place] Pager: [phone] 07/25/2016,11:51 AM

## 2016-07-25 NOTE — Progress Notes (Addendum)
      [address]       [place] [address]             [phone]        Procedure(s) (LRB): CORONARY ARTERY BYPASS GRAFTING (CABG) (N/A) TRANSESOPHAGEAL ECHOCARDIOGRAM (TEE) (N/A) POSSIBLE RADIAL LEFT ARTERY HARVEST (Left) Subjective: Feels well, no new sx Objective: Vital signs in last 24 hours: Temp:  [98 F (36.7 C)-99.4 F (37.4 C)] 98.6 F (37 C) (09/12 0422) Pulse Rate:  [50-86] 81 (09/12 0422) Cardiac Rhythm: Sinus bradycardia (09/11 1304) Resp:  [13-20] 16 (09/12 0422) BP: (115-156)/(84-103) 115/84 (09/12 0422) SpO2:  [97 %-100 %] 98 % (09/12 0422) Weight:  [186 lb 1.6 oz (84.4 kg)] 186 lb 1.6 oz (84.4 kg) (09/11 1800)  Hemodynamic parameters for last 24 hours:    Intake/Output from previous day: No intake/output data recorded. Intake/Output this shift: No intake/output data recorded.  General appearance: alert, cooperative and no distress Heart: regular rate and rhythm Lungs: clear to auscultation bilaterally Abdomen: benigh Extremities: no edema  Lab Results:  Recent Labs  07/24/16 0612 07/25/16 0047  WBC 6.7 8.5  HGB 14.4 14.2  HCT 42.4 43.5  PLT 137* 159   BMET:  Recent Labs  07/23/16 0929 07/24/16 0612  NA 137 138  K 3.8 4.1  CL 105 106  CO2 25 27  GLUCOSE 97 100*  BUN 16 14  CREATININE 1.06 1.22  CALCIUM 9.3 9.0    PT/INR:  Recent Labs  07/23/16 0929  LABPROT 14.4  INR 1.11   ABG No results found for: PHART, HCO3, TCO2, ACIDBASEDEF, O2SAT CBG (last 3)  No results for input(s): GLUCAP in the last 72 hours.  Meds Scheduled Meds: . aspirin EC  81 mg Oral Daily  . atenolol  25 mg Oral Daily  . rosuvastatin  10 mg Oral q1800  . sodium chloride flush  3 mL Intravenous Q12H   Continuous Infusions: . heparin 1,100 Units/hr (07/24/16 1846)   PRN Meds:.sodium chloride, acetaminophen, ALPRAZolam, fluticasone, morphine injection, ondansetron (ZOFRAN) IV, sodium chloride flush  Xrays Dg Chest 2  View  Result Date: 07/23/2016 CLINICAL DATA:  Chest pain for 2 weeks. EXAM: CHEST  2 VIEW COMPARISON:  None. FINDINGS: The heart size and mediastinal contours are within normal limits. Both lungs are clear. No pneumothorax or pleural effusion is noted. The visualized skeletal structures are unremarkable. IMPRESSION: No active cardiopulmonary disease. Electronically Signed   By: Marijo Conception, M.D.   On: 07/23/2016 09:45    Assessment/Plan: S/P Procedure(s) (LRB): CORONARY ARTERY BYPASS GRAFTING (CABG) (N/A) TRANSESOPHAGEAL ECHOCARDIOGRAM (TEE) (N/A) POSSIBLE RADIAL LEFT ARTERY HARVEST (Left)  1 remains stable on current rx 2 pharmacy managing heparin gtt 3 plan for OR tomorrow  LOS: 1 day    GOLD,WAYNE E 07/25/2016  Patient seen and examined, agree with above Uneventful night For CABG in AM Stop heparin drip 0400 tomorrow  Remo Lipps C. Roxan Hockey, MD [facility] [phone]

## 2016-07-26 ENCOUNTER — Inpatient Hospital Stay: Admit: 2016-07-26 | Payer: BLUE CROSS/BLUE SHIELD | Admitting: Thoracic Surgery (Cardiothoracic Vascular Surgery)

## 2016-07-26 ENCOUNTER — Inpatient Hospital Stay (HOSPITAL_COMMUNITY): Payer: BLUE CROSS/BLUE SHIELD

## 2016-07-26 ENCOUNTER — Inpatient Hospital Stay (HOSPITAL_COMMUNITY): Payer: BLUE CROSS/BLUE SHIELD | Admitting: Anesthesiology

## 2016-07-26 ENCOUNTER — Encounter (HOSPITAL_COMMUNITY): Payer: Self-pay | Admitting: Certified Registered"

## 2016-07-26 ENCOUNTER — Encounter (HOSPITAL_COMMUNITY)
Admission: AD | Disposition: A | Discharge status: Home / Self Care | Payer: Self-pay | Source: Other Acute Inpatient Hospital | Attending: Thoracic Surgery (Cardiothoracic Vascular Surgery)

## 2016-07-26 DIAGNOSIS — Z951 Presence of aortocoronary bypass graft: Secondary | ICD-10-CM

## 2016-07-26 DIAGNOSIS — I2510 Atherosclerotic heart disease of native coronary artery without angina pectoris: Secondary | ICD-10-CM | POA: Diagnosis present

## 2016-07-26 HISTORY — PX: RADIAL ARTERY HARVEST: SHX5067

## 2016-07-26 HISTORY — PX: TEE WITHOUT CARDIOVERSION: SHX5443

## 2016-07-26 HISTORY — PX: CORONARY ARTERY BYPASS GRAFT: SHX141

## 2016-07-26 LAB — CBC
HEMATOCRIT: 36.3 % — AB (ref 39.0–52.0)
HEMATOCRIT: 33.6 % — AB (ref 39.0–52.0)
HEMOGLOBIN: 11.7 g/dL — AB (ref 13.0–17.0)
HEMOGLOBIN: 10.6 g/dL — AB (ref 13.0–17.0)
MCH: 30.1 pg (ref 26.0–34.0)
MCH: 29.6 pg (ref 26.0–34.0)
MCHC: 32.2 g/dL (ref 30.0–36.0)
MCHC: 31.5 g/dL (ref 30.0–36.0)
MCV: 93.3 fL (ref 78.0–100.0)
MCV: 93.9 fL (ref 78.0–100.0)
Platelets: 91 10*3/uL — ABNORMAL LOW (ref 150–400)
Platelets: 94 10*3/uL — ABNORMAL LOW (ref 150–400)
RBC: 3.89 MIL/uL — ABNORMAL LOW (ref 4.22–5.81)
RBC: 3.58 MIL/uL — AB (ref 4.22–5.81)
RDW: 12.5 % (ref 11.5–15.5)
RDW: 12.5 % (ref 11.5–15.5)
WBC: 9.7 10*3/uL (ref 4.0–10.5)
WBC: 6.7 10*3/uL (ref 4.0–10.5)

## 2016-07-26 LAB — POCT I-STAT, CHEM 8
BUN: 13 mg/dL (ref 6–20)
BUN: 13 mg/dL (ref 6–20)
BUN: 11 mg/dL (ref 6–20)
BUN: 10 mg/dL (ref 6–20)
BUN: 12 mg/dL (ref 6–20)
BUN: 10 mg/dL (ref 6–20)
BUN: 10 mg/dL (ref 6–20)
CALCIUM ION: 1.25 mmol/L (ref 1.15–1.40)
CALCIUM ION: 1.02 mmol/L — AB (ref 1.15–1.40)
CALCIUM ION: 1.22 mmol/L (ref 1.15–1.40)
CHLORIDE: 101 mmol/L (ref 101–111)
CHLORIDE: 102 mmol/L (ref 101–111)
CHLORIDE: 99 mmol/L — AB (ref 101–111)
CHLORIDE: 102 mmol/L (ref 101–111)
CREATININE: 0.7 mg/dL (ref 0.61–1.24)
CREATININE: 1 mg/dL (ref 0.61–1.24)
Calcium, Ion: 1.22 mmol/L (ref 1.15–1.40)
Calcium, Ion: 1.25 mmol/L (ref 1.15–1.40)
Calcium, Ion: 1.09 mmol/L — ABNORMAL LOW (ref 1.15–1.40)
Calcium, Ion: 1.16 mmol/L (ref 1.15–1.40)
Chloride: 101 mmol/L (ref 101–111)
Chloride: 101 mmol/L (ref 101–111)
Chloride: 104 mmol/L (ref 101–111)
Creatinine, Ser: 0.8 mg/dL (ref 0.61–1.24)
Creatinine, Ser: 0.8 mg/dL (ref 0.61–1.24)
Creatinine, Ser: 0.8 mg/dL (ref 0.61–1.24)
Creatinine, Ser: 0.7 mg/dL (ref 0.61–1.24)
Creatinine, Ser: 0.7 mg/dL (ref 0.61–1.24)
GLUCOSE: 95 mg/dL (ref 65–99)
Glucose, Bld: 98 mg/dL (ref 65–99)
Glucose, Bld: 116 mg/dL — ABNORMAL HIGH (ref 65–99)
Glucose, Bld: 109 mg/dL — ABNORMAL HIGH (ref 65–99)
Glucose, Bld: 112 mg/dL — ABNORMAL HIGH (ref 65–99)
Glucose, Bld: 138 mg/dL — ABNORMAL HIGH (ref 65–99)
Glucose, Bld: 154 mg/dL — ABNORMAL HIGH (ref 65–99)
HCT: 31 % — ABNORMAL LOW (ref 39.0–52.0)
HEMATOCRIT: 41 % (ref 39.0–52.0)
HEMATOCRIT: 38 % — AB (ref 39.0–52.0)
HEMATOCRIT: 37 % — AB (ref 39.0–52.0)
HEMATOCRIT: 37 % — AB (ref 39.0–52.0)
HEMATOCRIT: 32 % — AB (ref 39.0–52.0)
HEMATOCRIT: 31 % — AB (ref 39.0–52.0)
HEMOGLOBIN: 13.9 g/dL (ref 13.0–17.0)
HEMOGLOBIN: 12.9 g/dL — AB (ref 13.0–17.0)
HEMOGLOBIN: 12.6 g/dL — AB (ref 13.0–17.0)
Hemoglobin: 10.5 g/dL — ABNORMAL LOW (ref 13.0–17.0)
Hemoglobin: 12.6 g/dL — ABNORMAL LOW (ref 13.0–17.0)
Hemoglobin: 10.9 g/dL — ABNORMAL LOW (ref 13.0–17.0)
Hemoglobin: 10.5 g/dL — ABNORMAL LOW (ref 13.0–17.0)
POTASSIUM: 3.9 mmol/L (ref 3.5–5.1)
POTASSIUM: 4 mmol/L (ref 3.5–5.1)
POTASSIUM: 3.8 mmol/L (ref 3.5–5.1)
POTASSIUM: 4.9 mmol/L (ref 3.5–5.1)
Potassium: 4 mmol/L (ref 3.5–5.1)
Potassium: 3.9 mmol/L (ref 3.5–5.1)
Potassium: 4.1 mmol/L (ref 3.5–5.1)
SODIUM: 140 mmol/L (ref 135–145)
SODIUM: 138 mmol/L (ref 135–145)
SODIUM: 138 mmol/L (ref 135–145)
SODIUM: 137 mmol/L (ref 135–145)
SODIUM: 138 mmol/L (ref 135–145)
SODIUM: 137 mmol/L (ref 135–145)
Sodium: 138 mmol/L (ref 135–145)
TCO2: 29 mmol/L (ref 0–100)
TCO2: 28 mmol/L (ref 0–100)
TCO2: 28 mmol/L (ref 0–100)
TCO2: 27 mmol/L (ref 0–100)
TCO2: 28 mmol/L (ref 0–100)
TCO2: 26 mmol/L (ref 0–100)
TCO2: 23 mmol/L (ref 0–100)

## 2016-07-26 LAB — PLATELET COUNT: PLATELETS: 94 10*3/uL — AB (ref 150–400)

## 2016-07-26 LAB — POCT I-STAT 3, ART BLOOD GAS (G3+)
ACID-BASE DEFICIT: 3 mmol/L — AB (ref 0.0–2.0)
Acid-Base Excess: 3 mmol/L — ABNORMAL HIGH (ref 0.0–2.0)
BICARBONATE: 22.5 mmol/L (ref 20.0–28.0)
Bicarbonate: 26.9 mmol/L (ref 20.0–28.0)
O2 SAT: 100 %
O2 Saturation: 95 %
PCO2 ART: 38.5 mmHg (ref 32.0–48.0)
PH ART: 7.452 — AB (ref 7.350–7.450)
PO2 ART: 353 mmHg — AB (ref 83.0–108.0)
TCO2: 28 mmol/L (ref 0–100)
TCO2: 24 mmol/L (ref 0–100)
pCO2 arterial: 36.8 mmHg (ref 32.0–48.0)
pH, Arterial: 7.387 (ref 7.350–7.450)
pO2, Arterial: 69 mmHg — ABNORMAL LOW (ref 83.0–108.0)

## 2016-07-26 LAB — CREATININE, SERUM
CREATININE: 1.03 mg/dL (ref 0.61–1.24)
GFR calc Af Amer: >60 mL/min (ref >60)
GFR calc non Af Amer: >60 mL/min (ref >60)

## 2016-07-26 LAB — HEMOGLOBIN A1C
HEMOGLOBIN A1C: 5.8 % — AB (ref 4.8–5.6)
MEAN PLASMA GLUCOSE: 120 mg/dL

## 2016-07-26 LAB — POCT I-STAT 4, (NA,K, GLUC, HGB,HCT)
GLUCOSE: 121 mg/dL — AB (ref 65–99)
HCT: 35 % — ABNORMAL LOW (ref 39.0–52.0)
HEMOGLOBIN: 11.9 g/dL — AB (ref 13.0–17.0)
POTASSIUM: 4.2 mmol/L (ref 3.5–5.1)
SODIUM: 140 mmol/L (ref 135–145)

## 2016-07-26 LAB — PROTIME-INR
INR: 1.55
PROTHROMBIN TIME: 18.7 s — AB (ref 11.4–15.2)

## 2016-07-26 LAB — GLUCOSE, CAPILLARY
GLUCOSE-CAPILLARY: 113 mg/dL — AB (ref 65–99)
Glucose-Capillary: 93 mg/dL (ref 65–99)
Glucose-Capillary: 128 mg/dL — ABNORMAL HIGH (ref 65–99)
Glucose-Capillary: 128 mg/dL — ABNORMAL HIGH (ref 65–99)
Glucose-Capillary: 113 mg/dL — ABNORMAL HIGH (ref 65–99)
Glucose-Capillary: 113 mg/dL — ABNORMAL HIGH (ref 65–99)

## 2016-07-26 LAB — HEMOGLOBIN AND HEMATOCRIT, BLOOD
HEMATOCRIT: 30.9 % — AB (ref 39.0–52.0)
HEMOGLOBIN: 10 g/dL — AB (ref 13.0–17.0)

## 2016-07-26 LAB — MAGNESIUM: MAGNESIUM: 2.8 mg/dL — AB (ref 1.7–2.4)

## 2016-07-26 LAB — APTT: aPTT: 35 seconds (ref 24–36)

## 2016-07-26 SURGERY — CORONARY ARTERY BYPASS GRAFTING (CABG)
Anesthesia: General | Site: Chest

## 2016-07-26 MED ORDER — PANTOPRAZOLE SODIUM 40 MG PO TBEC
40.0000 mg | DELAYED_RELEASE_TABLET | Freq: Every day | ORAL | Status: DC
  Administered 2016-07-28 – 2016-07-31 (×4): 40 mg via ORAL
  Filled 2016-07-26 (×4): qty 1

## 2016-07-26 MED ORDER — LACTATED RINGERS IV SOLN: INTRAVENOUS | Status: DC

## 2016-07-26 MED ORDER — ACETAMINOPHEN 160 MG/5ML PO SOLN: 650.0000 mg | Freq: Once | ORAL | Status: AC

## 2016-07-26 MED ORDER — DEXMEDETOMIDINE HCL IN NACL 200 MCG/50ML IV SOLN
0.0000 ug/kg/h | INTRAVENOUS | Status: DC
  Administered 2016-07-26: 0.5 ug/kg/h via INTRAVENOUS
  Filled 2016-07-26 (×2): qty 50

## 2016-07-26 MED ORDER — PROPOFOL 10 MG/ML IV BOLUS
INTRAVENOUS | Status: DC | PRN
Start: 2016-07-26 — End: 2016-07-26
  Administered 2016-07-26: 100 mg via INTRAVENOUS

## 2016-07-26 MED ORDER — HEMOSTATIC AGENTS (NO CHARGE) OPTIME
TOPICAL | Status: DC | PRN
  Administered 2016-07-26: 1 via TOPICAL

## 2016-07-26 MED ORDER — HEPARIN SODIUM (PORCINE) 1000 UNIT/ML IJ SOLN
INTRAMUSCULAR | Status: AC
  Filled 2016-07-26: qty 1

## 2016-07-26 MED ORDER — ACETAMINOPHEN 500 MG PO TABS
1000.0000 mg | ORAL_TABLET | Freq: Four times a day (QID) | ORAL | Status: DC
  Administered 2016-07-27 – 2016-07-31 (×15): 1000 mg via ORAL
  Filled 2016-07-26 (×16): qty 2

## 2016-07-26 MED ORDER — INSULIN REGULAR BOLUS VIA INFUSION
0.0000 [IU] | Freq: Three times a day (TID) | INTRAVENOUS | Status: DC
  Filled 2016-07-26: qty 10

## 2016-07-26 MED ORDER — DOCUSATE SODIUM 100 MG PO CAPS
200.0000 mg | ORAL_CAPSULE | Freq: Every day | ORAL | Status: DC
  Administered 2016-07-27 – 2016-07-28 (×2): 200 mg via ORAL
  Filled 2016-07-26 (×4): qty 2

## 2016-07-26 MED ORDER — ORAL CARE MOUTH RINSE
15.0000 mL | OROMUCOSAL | Status: DC
  Administered 2016-07-27: 15 mL via OROMUCOSAL

## 2016-07-26 MED ORDER — HEPARIN SODIUM (PORCINE) 1000 UNIT/ML IJ SOLN
INTRAMUSCULAR | Status: AC
  Filled 2016-07-26: qty 2

## 2016-07-26 MED ORDER — MAGNESIUM SULFATE 4 GM/100ML IV SOLN
4.0000 g | Freq: Once | INTRAVENOUS | Status: AC
  Administered 2016-07-26: 4 g via INTRAVENOUS
  Filled 2016-07-26: qty 100

## 2016-07-26 MED ORDER — ORAL CARE MOUTH RINSE: 15.0000 mL | Freq: Two times a day (BID) | OROMUCOSAL | Status: DC

## 2016-07-26 MED ORDER — SODIUM CHLORIDE 0.9% FLUSH
3.0000 mL | Freq: Two times a day (BID) | INTRAVENOUS | Status: DC
  Administered 2016-07-27 (×2): 3 mL via INTRAVENOUS

## 2016-07-26 MED ORDER — METOPROLOL TARTRATE 25 MG/10 ML ORAL SUSPENSION: 12.5000 mg | Freq: Two times a day (BID) | ORAL | Status: DC

## 2016-07-26 MED ORDER — PLASMA-LYTE 148 IV SOLN
INTRAVENOUS | Status: AC
  Administered 2016-07-26: 500 mL
  Filled 2016-07-26: qty 2.5

## 2016-07-26 MED ORDER — PROPOFOL 10 MG/ML IV BOLUS
INTRAVENOUS | Status: AC
  Filled 2016-07-26: qty 20

## 2016-07-26 MED ORDER — ALBUMIN HUMAN 5 % IV SOLN
250.0000 mL | INTRAVENOUS | Status: AC | PRN
  Administered 2016-07-26 (×4): 250 mL via INTRAVENOUS
  Filled 2016-07-26 (×2): qty 250

## 2016-07-26 MED ORDER — ROCURONIUM BROMIDE 10 MG/ML (PF) SYRINGE
PREFILLED_SYRINGE | INTRAVENOUS | Status: AC
Start: 2016-07-26 — End: 2016-07-26
  Filled 2016-07-26: qty 10

## 2016-07-26 MED ORDER — INSULIN ASPART 100 UNIT/ML ~~LOC~~ SOLN
0.0000 [IU] | SUBCUTANEOUS | Status: DC
  Administered 2016-07-27 (×2): 2 [IU] via SUBCUTANEOUS
  Administered 2016-07-28: 4 [IU] via SUBCUTANEOUS
  Administered 2016-07-28: 2 [IU] via SUBCUTANEOUS

## 2016-07-26 MED ORDER — PHENYLEPHRINE HCL 10 MG/ML IJ SOLN
0.0000 ug/min | INTRAVENOUS | Status: DC
  Administered 2016-07-27: 20 ug/min via INTRAVENOUS
  Filled 2016-07-26: qty 2

## 2016-07-26 MED ORDER — ASPIRIN 81 MG PO CHEW
324.0000 mg | CHEWABLE_TABLET | Freq: Every day | ORAL | Status: DC
  Filled 2016-07-26: qty 4

## 2016-07-26 MED ORDER — SODIUM CHLORIDE 0.9 % IV SOLN
INTRAVENOUS | Status: DC
  Administered 2016-07-26: 18:00:00 via INTRAVENOUS
  Filled 2016-07-26: qty 2.5

## 2016-07-26 MED ORDER — MIDAZOLAM HCL 5 MG/5ML IJ SOLN
INTRAMUSCULAR | Status: DC | PRN
Start: 2016-07-26 — End: 2016-07-26
  Administered 2016-07-26: 2 mg via INTRAVENOUS
  Administered 2016-07-26 (×2): 1 mg via INTRAVENOUS
  Administered 2016-07-26: 2 mg via INTRAVENOUS
  Administered 2016-07-26: 1 mg via INTRAVENOUS

## 2016-07-26 MED ORDER — LACTATED RINGERS IV SOLN
INTRAVENOUS | Status: DC | PRN
  Administered 2016-07-26: 08:00:00 via INTRAVENOUS

## 2016-07-26 MED ORDER — ONDANSETRON HCL 4 MG/2ML IJ SOLN: 4.0000 mg | Freq: Four times a day (QID) | INTRAMUSCULAR | Status: DC | PRN

## 2016-07-26 MED ORDER — TRAMADOL HCL 50 MG PO TABS
50.0000 mg | ORAL_TABLET | ORAL | Status: DC | PRN
  Administered 2016-07-27 – 2016-07-28 (×2): 100 mg via ORAL
  Filled 2016-07-26 (×2): qty 2

## 2016-07-26 MED ORDER — NITROGLYCERIN IN D5W 200-5 MCG/ML-% IV SOLN: 0.0000 ug/min | INTRAVENOUS | Status: AC

## 2016-07-26 MED ORDER — PHENYLEPHRINE HCL 10 MG/ML IJ SOLN
INTRAMUSCULAR | Status: DC | PRN
Start: 2016-07-26 — End: 2016-07-26
  Administered 2016-07-26: 80 ug via INTRAVENOUS
  Administered 2016-07-26: 40 ug via INTRAVENOUS

## 2016-07-26 MED ORDER — ACETAMINOPHEN 160 MG/5ML PO SOLN
1000.0000 mg | Freq: Four times a day (QID) | ORAL | Status: DC
  Administered 2016-07-26: 1000 mg
  Filled 2016-07-26: qty 40.6

## 2016-07-26 MED ORDER — PROTAMINE SULFATE 10 MG/ML IV SOLN
INTRAVENOUS | Status: AC
  Filled 2016-07-26: qty 5

## 2016-07-26 MED ORDER — POTASSIUM CHLORIDE 10 MEQ/50ML IV SOLN: 10.0000 meq | INTRAVENOUS | Status: AC

## 2016-07-26 MED ORDER — ORAL CARE MOUTH RINSE: 15.0000 mL | Freq: Four times a day (QID) | OROMUCOSAL | Status: DC

## 2016-07-26 MED ORDER — SODIUM CHLORIDE 0.9 % IV SOLN
250.0000 mL | INTRAVENOUS | Status: DC
  Administered 2016-07-27: 250 mL via INTRAVENOUS

## 2016-07-26 MED ORDER — LACTATED RINGERS IV SOLN
INTRAVENOUS | Status: DC | PRN
  Administered 2016-07-26 (×2): via INTRAVENOUS

## 2016-07-26 MED ORDER — BISACODYL 5 MG PO TBEC
10.0000 mg | DELAYED_RELEASE_TABLET | Freq: Every day | ORAL | Status: DC
  Administered 2016-07-27 – 2016-07-28 (×2): 10 mg via ORAL
  Filled 2016-07-26 (×2): qty 2

## 2016-07-26 MED ORDER — BISACODYL 10 MG RE SUPP
10.0000 mg | Freq: Every day | RECTAL | Status: DC
Start: 2016-07-27 — End: 2016-07-29

## 2016-07-26 MED ORDER — GELATIN ABSORBABLE MT POWD
OROMUCOSAL | Status: DC | PRN
  Administered 2016-07-26 (×3): 4 mL via TOPICAL

## 2016-07-26 MED ORDER — FAMOTIDINE IN NACL 20-0.9 MG/50ML-% IV SOLN
20.0000 mg | Freq: Two times a day (BID) | INTRAVENOUS | Status: AC
  Administered 2016-07-26: 20 mg via INTRAVENOUS

## 2016-07-26 MED ORDER — METOPROLOL TARTRATE 5 MG/5ML IV SOLN: 2.5000 mg | INTRAVENOUS | Status: DC | PRN

## 2016-07-26 MED ORDER — MIDAZOLAM HCL 2 MG/2ML IJ SOLN
2.0000 mg | INTRAMUSCULAR | Status: DC | PRN
  Administered 2016-07-26 (×2): 2 mg via INTRAVENOUS
  Filled 2016-07-26 (×3): qty 2

## 2016-07-26 MED ORDER — HEPARIN SODIUM (PORCINE) 1000 UNIT/ML IJ SOLN
INTRAMUSCULAR | Status: DC | PRN
  Administered 2016-07-26: 2000 [IU] via INTRAVENOUS
  Administered 2016-07-26: 35000 [IU] via INTRAVENOUS

## 2016-07-26 MED ORDER — 0.9 % SODIUM CHLORIDE (POUR BTL) OPTIME
TOPICAL | Status: DC | PRN
  Administered 2016-07-26: 6000 mL

## 2016-07-26 MED ORDER — LACTATED RINGERS IV SOLN
INTRAVENOUS | Status: DC | PRN
  Administered 2016-07-26: 09:00:00 via INTRAVENOUS

## 2016-07-26 MED ORDER — FENTANYL CITRATE (PF) 250 MCG/5ML IJ SOLN
INTRAMUSCULAR | Status: AC
  Filled 2016-07-26: qty 20

## 2016-07-26 MED ORDER — LACTATED RINGERS IV SOLN: 500.0000 mL | Freq: Once | INTRAVENOUS | Status: DC | PRN

## 2016-07-26 MED ORDER — PROTAMINE SULFATE 10 MG/ML IV SOLN
INTRAVENOUS | Status: DC | PRN
  Administered 2016-07-26: 50 mg via INTRAVENOUS
  Administered 2016-07-26: 10 mg via INTRAVENOUS
  Administered 2016-07-26 (×4): 50 mg via INTRAVENOUS
  Administered 2016-07-26: 40 mg via INTRAVENOUS

## 2016-07-26 MED ORDER — ARTIFICIAL TEARS OP OINT
TOPICAL_OINTMENT | OPHTHALMIC | Status: DC | PRN
  Administered 2016-07-26: 1 via OPHTHALMIC

## 2016-07-26 MED ORDER — CHLORHEXIDINE GLUCONATE 0.12% ORAL RINSE (MEDLINE KIT): 15.0000 mL | Freq: Two times a day (BID) | OROMUCOSAL | Status: DC

## 2016-07-26 MED ORDER — SODIUM CHLORIDE 0.9 % IV SOLN
INTRAVENOUS | Status: DC | PRN
  Administered 2016-07-26: 14:00:00 via INTRAVENOUS

## 2016-07-26 MED ORDER — SODIUM CHLORIDE 0.45 % IV SOLN
INTRAVENOUS | Status: DC | PRN
  Administered 2016-07-26: 15:00:00 via INTRAVENOUS

## 2016-07-26 MED ORDER — VANCOMYCIN HCL IN DEXTROSE 1-5 GM/200ML-% IV SOLN
1000.0000 mg | Freq: Once | INTRAVENOUS | Status: AC
  Administered 2016-07-26: 1000 mg via INTRAVENOUS
  Filled 2016-07-26: qty 200

## 2016-07-26 MED ORDER — CHLORHEXIDINE GLUCONATE 0.12 % MT SOLN
15.0000 mL | OROMUCOSAL | Status: AC
  Administered 2016-07-26: 15 mL via OROMUCOSAL

## 2016-07-26 MED ORDER — ARTIFICIAL TEARS OP OINT
TOPICAL_OINTMENT | OPHTHALMIC | Status: AC
  Filled 2016-07-26: qty 3.5

## 2016-07-26 MED ORDER — SODIUM CHLORIDE 0.9% FLUSH: 3.0000 mL | INTRAVENOUS | Status: DC | PRN

## 2016-07-26 MED ORDER — PROTAMINE SULFATE 10 MG/ML IV SOLN
INTRAVENOUS | Status: AC
  Filled 2016-07-26: qty 25

## 2016-07-26 MED ORDER — ASPIRIN EC 325 MG PO TBEC
325.0000 mg | DELAYED_RELEASE_TABLET | Freq: Every day | ORAL | Status: DC
  Administered 2016-07-27 – 2016-07-31 (×5): 325 mg via ORAL
  Filled 2016-07-26 (×5): qty 1

## 2016-07-26 MED ORDER — PHENYLEPHRINE 40 MCG/ML (10ML) SYRINGE FOR IV PUSH (FOR BLOOD PRESSURE SUPPORT)
PREFILLED_SYRINGE | INTRAVENOUS | Status: AC
  Filled 2016-07-26: qty 10

## 2016-07-26 MED ORDER — METOPROLOL TARTRATE 12.5 MG HALF TABLET
12.5000 mg | ORAL_TABLET | Freq: Two times a day (BID) | ORAL | Status: DC
  Administered 2016-07-28: 12.5 mg via ORAL
  Filled 2016-07-26 (×2): qty 1

## 2016-07-26 MED ORDER — MIDAZOLAM HCL 10 MG/2ML IJ SOLN
INTRAMUSCULAR | Status: AC
  Filled 2016-07-26: qty 2

## 2016-07-26 MED ORDER — ROCURONIUM BROMIDE 10 MG/ML (PF) SYRINGE
PREFILLED_SYRINGE | INTRAVENOUS | Status: AC
  Filled 2016-07-26: qty 10

## 2016-07-26 MED ORDER — DEXTROSE 5 % IV SOLN
1.5000 g | Freq: Two times a day (BID) | INTRAVENOUS | Status: DC
  Administered 2016-07-26 – 2016-07-27 (×3): 1.5 g via INTRAVENOUS
  Filled 2016-07-26 (×4): qty 1.5

## 2016-07-26 MED ORDER — CHLORHEXIDINE GLUCONATE 0.12% ORAL RINSE (MEDLINE KIT)
15.0000 mL | Freq: Two times a day (BID) | OROMUCOSAL | Status: DC
  Administered 2016-07-26: 15 mL via OROMUCOSAL

## 2016-07-26 MED ORDER — OXYCODONE HCL 5 MG PO TABS
5.0000 mg | ORAL_TABLET | ORAL | Status: DC | PRN
  Administered 2016-07-27: 5 mg via ORAL
  Administered 2016-07-27 – 2016-07-30 (×9): 10 mg via ORAL
  Filled 2016-07-26 (×2): qty 1
  Filled 2016-07-26 (×4): qty 2
  Filled 2016-07-26: qty 1
  Filled 2016-07-26 (×4): qty 2

## 2016-07-26 MED ORDER — FENTANYL CITRATE (PF) 250 MCG/5ML IJ SOLN
INTRAMUSCULAR | Status: DC | PRN
  Administered 2016-07-26: 50 ug via INTRAVENOUS
  Administered 2016-07-26: 100 ug via INTRAVENOUS
  Administered 2016-07-26: 150 ug via INTRAVENOUS
  Administered 2016-07-26: 50 ug via INTRAVENOUS
  Administered 2016-07-26: 100 ug via INTRAVENOUS
  Administered 2016-07-26: 350 ug via INTRAVENOUS
  Administered 2016-07-26: 50 ug via INTRAVENOUS
  Administered 2016-07-26: 100 ug via INTRAVENOUS
  Administered 2016-07-26: 50 ug via INTRAVENOUS

## 2016-07-26 MED ORDER — ROCURONIUM BROMIDE 100 MG/10ML IV SOLN: INTRAVENOUS | Status: DC | PRN

## 2016-07-26 MED ORDER — PHENYLEPHRINE HCL 10 MG/ML IJ SOLN: INTRAVENOUS | Status: DC | PRN

## 2016-07-26 MED ORDER — SODIUM CHLORIDE 0.9 % IV SOLN: INTRAVENOUS | Status: DC

## 2016-07-26 MED ORDER — MORPHINE SULFATE (PF) 2 MG/ML IV SOLN
2.0000 mg | INTRAVENOUS | Status: DC | PRN
  Administered 2016-07-27 (×3): 2 mg via INTRAVENOUS
  Filled 2016-07-26: qty 1

## 2016-07-26 MED ORDER — ACETAMINOPHEN 650 MG RE SUPP
650.0000 mg | Freq: Once | RECTAL | Status: AC
  Administered 2016-07-26: 650 mg via RECTAL

## 2016-07-26 MED ORDER — ROCURONIUM BROMIDE 10 MG/ML (PF) SYRINGE
PREFILLED_SYRINGE | INTRAVENOUS | Status: DC | PRN
  Administered 2016-07-26: 100 mg via INTRAVENOUS
  Administered 2016-07-26: 50 mg via INTRAVENOUS
  Administered 2016-07-26 (×2): 20 mg via INTRAVENOUS
  Administered 2016-07-26: 30 mg via INTRAVENOUS
  Administered 2016-07-26: 50 mg via INTRAVENOUS

## 2016-07-26 MED ORDER — CHLORHEXIDINE GLUCONATE 0.12 % MT SOLN: 15.0000 mL | Freq: Two times a day (BID) | OROMUCOSAL | Status: DC

## 2016-07-26 MED ORDER — MORPHINE SULFATE (PF) 2 MG/ML IV SOLN
1.0000 mg | INTRAVENOUS | Status: DC | PRN
  Administered 2016-07-26 (×2): 2 mg via INTRAVENOUS
  Filled 2016-07-26 (×2): qty 2

## 2016-07-26 MED FILL — Heparin Sodium (Porcine) Inj 1000 Unit/ML: INTRAMUSCULAR | Qty: 30 | Status: AC

## 2016-07-26 MED FILL — Potassium Chloride Inj 2 mEq/ML: INTRAVENOUS | Qty: 40 | Status: AC

## 2016-07-26 MED FILL — Magnesium Sulfate Inj 50%: INTRAMUSCULAR | Qty: 10 | Status: AC

## 2016-07-26 SURGICAL SUPPLY — 100 items
ADH SKN CLS APL DERMABOND .7 (GAUZE/BANDAGES/DRESSINGS) ×3
APPLIER CLIP 9.375 SM OPEN (CLIP) ×5
APR CLP SM 9.3 20 MLT OPN (CLIP) ×3
BAG DECANTER FOR FLEXI CONT (MISCELLANEOUS) ×5 IMPLANT
BANDAGE ACE 4X5 VEL STRL LF (GAUZE/BANDAGES/DRESSINGS) ×5 IMPLANT
BANDAGE ACE 6X5 VEL STRL LF (GAUZE/BANDAGES/DRESSINGS) ×5 IMPLANT
BANDAGE ELASTIC 4 VELCRO ST LF (GAUZE/BANDAGES/DRESSINGS) ×4 IMPLANT
BANDAGE ELASTIC 6 VELCRO ST LF (GAUZE/BANDAGES/DRESSINGS) ×2 IMPLANT
BASKET HEART  (ORDER IN 25'S) (MISCELLANEOUS) ×1
BASKET HEART (ORDER IN 25'S) (MISCELLANEOUS) ×1
BASKET HEART (ORDER IN 25S) (MISCELLANEOUS) ×3 IMPLANT
BLADE NDL 3 SS STRL (BLADE) IMPLANT
BLADE NEEDLE 3 SS STRL (BLADE) ×4 IMPLANT
BLADE NEEDLE 3MM SS STRL (BLADE) ×1
BLADE STERNUM SYSTEM 6 (BLADE) ×5 IMPLANT
BLADE SURG 15 STRL LF DISP TIS (BLADE) IMPLANT
BLADE SURG 15 STRL SS (BLADE)
BNDG GAUZE ELAST 4 BULKY (GAUZE/BANDAGES/DRESSINGS) ×7 IMPLANT
CANISTER SUCTION 2500CC (MISCELLANEOUS) ×5 IMPLANT
CANNULA EZ GLIDE AORTIC 21FR (CANNULA) ×5 IMPLANT
CATH CPB KIT HENDRICKSON (MISCELLANEOUS) ×5 IMPLANT
CATH ROBINSON RED A/P 18FR (CATHETERS) ×7 IMPLANT
CATH THORACIC 36FR (CATHETERS) ×5 IMPLANT
CATH THORACIC 36FR RT ANG (CATHETERS) ×5 IMPLANT
CLIP APPLIE 9.375 SM OPEN (CLIP) IMPLANT
CLIP TI MEDIUM 24 (CLIP) ×2 IMPLANT
CLIP TI WIDE RED SMALL 24 (CLIP) ×14 IMPLANT
COVER MAYO STAND STRL (DRAPES) ×4 IMPLANT
CRADLE DONUT ADULT HEAD (MISCELLANEOUS) ×5 IMPLANT
DERMABOND ADVANCED (GAUZE/BANDAGES/DRESSINGS) ×2
DERMABOND ADVANCED .7 DNX12 (GAUZE/BANDAGES/DRESSINGS) IMPLANT
DRAPE CARDIOVASCULAR INCISE (DRAPES) ×5
DRAPE EXTREMITY T 121X128X90 (DRAPE) IMPLANT
DRAPE PROXIMA HALF (DRAPES) IMPLANT
DRAPE SLUSH/WARMER DISC (DRAPES) ×5 IMPLANT
DRAPE SRG 135X102X78XABS (DRAPES) ×3 IMPLANT
DRSG COVADERM 4X14 (GAUZE/BANDAGES/DRESSINGS) ×5 IMPLANT
ELECT REM PT RETURN 9FT ADLT (ELECTROSURGICAL) ×10
ELECTRODE REM PT RTRN 9FT ADLT (ELECTROSURGICAL) ×6 IMPLANT
FELT TEFLON 1X6 (MISCELLANEOUS) ×8 IMPLANT
GAUZE SPONGE 4X4 12PLY STRL (GAUZE/BANDAGES/DRESSINGS) ×12 IMPLANT
GEL ULTRASOUND 20GR AQUASONIC (MISCELLANEOUS) IMPLANT
GLOVE SURG SIGNA 7.5 PF LTX (GLOVE) ×17 IMPLANT
GOWN STRL REUS W/ TWL LRG LVL3 (GOWN DISPOSABLE) ×12 IMPLANT
GOWN STRL REUS W/ TWL XL LVL3 (GOWN DISPOSABLE) ×6 IMPLANT
GOWN STRL REUS W/TWL LRG LVL3 (GOWN DISPOSABLE) ×50
GOWN STRL REUS W/TWL XL LVL3 (GOWN DISPOSABLE) ×10
HARMONIC SHEARS 14CM COAG (MISCELLANEOUS) ×7 IMPLANT
HEMOSTAT POWDER SURGIFOAM 1G (HEMOSTASIS) ×15 IMPLANT
HEMOSTAT SURGICEL 2X14 (HEMOSTASIS) ×5 IMPLANT
INSERT FOGARTY XLG (MISCELLANEOUS) IMPLANT
KIT BASIN OR (CUSTOM PROCEDURE TRAY) ×5 IMPLANT
KIT ROOM TURNOVER OR (KITS) ×5 IMPLANT
KIT SUCTION CATH 14FR (SUCTIONS) ×10 IMPLANT
KIT VASOVIEW HEMOPRO VH 3000 (KITS) ×5 IMPLANT
MARKER GRAFT CORONARY BYPASS (MISCELLANEOUS) ×17 IMPLANT
MARKER SKIN DUAL TIP RULER LAB (MISCELLANEOUS) ×2 IMPLANT
NS IRRIG 1000ML POUR BTL (IV SOLUTION) ×27 IMPLANT
PACK OPEN HEART (CUSTOM PROCEDURE TRAY) ×5 IMPLANT
PAD ARMBOARD 7.5X6 YLW CONV (MISCELLANEOUS) ×10 IMPLANT
PAD ELECT DEFIB RADIOL ZOLL (MISCELLANEOUS) ×5 IMPLANT
PENCIL BUTTON HOLSTER BLD 10FT (ELECTRODE) ×5 IMPLANT
PUNCH AORTIC ROTATE  4.5MM 8IN (MISCELLANEOUS) ×2 IMPLANT
PUNCH AORTIC ROTATE 4.0MM (MISCELLANEOUS) IMPLANT
PUNCH AORTIC ROTATE 4.5MM 8IN (MISCELLANEOUS) IMPLANT
PUNCH AORTIC ROTATE 5MM 8IN (MISCELLANEOUS) IMPLANT
SET CARDIOPLEGIA MPS 5001102 (MISCELLANEOUS) ×2 IMPLANT
SOLUTION ANTI FOG 6CC (MISCELLANEOUS) ×2 IMPLANT
SUT BONE WAX W31G (SUTURE) ×5 IMPLANT
SUT MNCRL AB 4-0 PS2 18 (SUTURE) ×2 IMPLANT
SUT PROLENE 3 0 SH DA (SUTURE) ×5 IMPLANT
SUT PROLENE 4 0 RB 1 (SUTURE) ×15
SUT PROLENE 4 0 SH DA (SUTURE) ×2 IMPLANT
SUT PROLENE 4-0 RB1 .5 CRCL 36 (SUTURE) IMPLANT
SUT PROLENE 6 0 C 1 30 (SUTURE) ×10 IMPLANT
SUT PROLENE 7 0 BV1 MDA (SUTURE) ×5 IMPLANT
SUT PROLENE 8 0 BV175 6 (SUTURE) ×2 IMPLANT
SUT STEEL 6MS V (SUTURE) ×5 IMPLANT
SUT STEEL STERNAL CCS#1 18IN (SUTURE) IMPLANT
SUT STEEL SZ 6 DBL 3X14 BALL (SUTURE) ×5 IMPLANT
SUT VIC AB 1 CTX 36 (SUTURE) ×10
SUT VIC AB 1 CTX36XBRD ANBCTR (SUTURE) ×6 IMPLANT
SUT VIC AB 2-0 CT1 27 (SUTURE)
SUT VIC AB 2-0 CT1 36 (SUTURE) ×4 IMPLANT
SUT VIC AB 2-0 CT1 TAPERPNT 27 (SUTURE) IMPLANT
SUT VIC AB 2-0 CTX 27 (SUTURE) IMPLANT
SUT VIC AB 3-0 SH 27 (SUTURE)
SUT VIC AB 3-0 SH 27X BRD (SUTURE) IMPLANT
SUT VIC AB 3-0 X1 27 (SUTURE) ×4 IMPLANT
SUT VICRYL 4-0 PS2 18IN ABS (SUTURE) IMPLANT
SUTURE E-PAK OPEN HEART (SUTURE) ×5 IMPLANT
SYR 50ML SLIP (SYRINGE) ×2 IMPLANT
SYSTEM SAHARA CHEST DRAIN ATS (WOUND CARE) ×5 IMPLANT
TOWEL OR 17X24 6PK STRL BLUE (TOWEL DISPOSABLE) ×10 IMPLANT
TOWEL OR 17X26 10 PK STRL BLUE (TOWEL DISPOSABLE) ×10 IMPLANT
TRAY FOLEY IC TEMP SENS 16FR (CATHETERS) ×5 IMPLANT
TUBE FEEDING 8FR 16IN STR KANG (MISCELLANEOUS) ×5 IMPLANT
TUBING INSUFFLATION (TUBING) ×5 IMPLANT
UNDERPAD 30X30 (UNDERPADS AND DIAPERS) ×5 IMPLANT
WATER STERILE IRR 1000ML POUR (IV SOLUTION) ×10 IMPLANT

## 2016-07-26 NOTE — Interval H&P Note (Signed)
History and Physical Interval Note:  Normal Allen's test on left. Will plan left radial+ bilateral IMA   07/26/2016 8:19 AM  Nicholas Torres  has presented today for surgery, with the diagnosis of CAD  The various methods of treatment have been discussed with the patient and family. After consideration of risks, benefits and other options for treatment, the patient has consented to  Procedure(s): CORONARY ARTERY BYPASS GRAFTING (CABG) (N/A) TRANSESOPHAGEAL ECHOCARDIOGRAM (TEE) (N/A) POSSIBLE RADIAL LEFT ARTERY HARVEST (Left) as a surgical intervention .  The patient's history has been reviewed, patient examined, no change in status, stable for surgery.  I have reviewed the patient's chart and labs.  Questions were answered to the patient's satisfaction.     Melrose Nakayama

## 2016-07-26 NOTE — Progress Notes (Signed)
Rapid Wean Protocol begun, patient tolerating well and RT and RN at bedside to monitor.

## 2016-07-26 NOTE — Anesthesia Postprocedure Evaluation (Signed)
Anesthesia Post Note  Patient: Nicholas Torres  Procedure(s) Performed: Procedure(s) (LRB): CORONARY ARTERY BYPASS GRAFTING (CABG), ON PUMP, TIMES FOUR, USING BILATERAL INTERNAL MAMMARY ARTERIES, RIGHT GREATER SAPHENOUS VEIN HARVESTED ENDOSCOPICALLY (N/A) TRANSESOPHAGEAL ECHOCARDIOGRAM (TEE) (N/A) RADIAL LEFT ARTERY HARVEST (Left)  Patient location during evaluation: SICU Anesthesia Type: General Level of consciousness: sedated Pain management: pain level controlled Vital Signs Assessment: post-procedure vital signs reviewed and stable Respiratory status: patient remains intubated per anesthesia plan Cardiovascular status: stable Anesthetic complications: no    Last Vitals:  Vitals:   07/26/16 1530 07/26/16 1545  BP:    Pulse: 80 80  Resp: 12 12  Temp: (!) 35.3 C (!) 35.3 C    Last Pain:  Vitals:   07/26/16 1545  TempSrc: Core (Comment)  PainSc:                  Zenaida Deed

## 2016-07-26 NOTE — Progress Notes (Signed)
Patient ID: Nicholas Torres, male   DOB: 1961/04/08, 55 y.o.   MRN: [id number] EVENING ROUNDS NOTE :     [address]       [place] [address]             [phone]                 Day of Surgery Procedure(s) (LRB): CORONARY ARTERY BYPASS GRAFTING (CABG), ON PUMP, TIMES FOUR, USING BILATERAL INTERNAL MAMMARY ARTERIES, RIGHT GREATER SAPHENOUS VEIN HARVESTED ENDOSCOPICALLY (N/A) TRANSESOPHAGEAL ECHOCARDIOGRAM (TEE) (N/A) RADIAL LEFT ARTERY HARVEST (Left)  Total Length of Stay:  LOS: 2 days  BP (!) 81/71 (BP Location: Right Arm)   Pulse 88   Temp 99.5 F (37.5 C) (Core (Comment))   Resp 18   Ht 5\' 5"  (1.651 m)   Wt 179 lb 8 oz (81.4 kg)   SpO2 100%   BMI 29.87 kg/m   .Intake/Output      09/12 0701 - 09/13 0700 09/13 0701 - 09/14 0700   P.O. 600    I.V. (mL/kg) 232 (2.9) 2891.4 (35.5)   Blood  305   NG/GT  30   IV Piggyback  650   Total Intake(mL/kg) 832 (10.2) 3876.4 (47.6)   Urine (mL/kg/hr) 400 (0.2) 2425 (2.6)   Emesis/NG output  0 (0)   Blood  800 (0.8)   Chest Tube  340 (0.4)   Total Output 400 3565   Net +432 +311.4        Urine Occurrence 2 x      . sodium chloride 20 mL/hr at 07/26/16 1800  . [START ON 07/27/2016] sodium chloride    . sodium chloride 20 mL/hr at 07/26/16 1800  . dexmedetomidine 0.5 mcg/kg/hr (07/26/16 1825)  . insulin (NOVOLIN-R) infusion 0.5 Units/hr (07/26/16 1800)  . lactated ringers 20 mL/hr at 07/26/16 1800  . lactated ringers 20 mL/hr at 07/26/16 1800  . nitroGLYCERIN 5 mcg/min (07/26/16 1800)  . phenylephrine (NEO-SYNEPHRINE) Adult infusion Stopped (07/26/16 1825)     Lab Results  Component Value Date   WBC 9.7 07/26/2016   HGB 11.7 (L) 07/26/2016   HCT 36.3 (L) 07/26/2016   PLT 91 (L) 07/26/2016   GLUCOSE 121 (H) 07/26/2016   CHOL 165 07/24/2016   TRIG 86 07/24/2016   HDL 44 07/24/2016   LDLCALC 104 (H) 07/24/2016   ALT 23 07/25/2016   AST 55 (H) 07/25/2016   NA 140 07/26/2016   K 4.2 07/26/2016   CL  102 07/26/2016   CREATININE 0.70 07/26/2016   BUN 10 07/26/2016   CO2 27 07/25/2016   TSH 1.329 07/10/2013   PSA 0.20 07/10/2013   INR 1.55 07/26/2016   HGBA1C 5.8 (H) 07/25/2016   initially woke up biting tube and not following commands , now sedated and weaning slowly  Not bleeding  Grace Isaac MD  Beeper [phone] Office [phone] 07/26/2016 6:36 PM

## 2016-07-26 NOTE — Transfer of Care (Signed)
Immediate Anesthesia Transfer of Care Note  Patient: Nicholas Torres  Procedure(s) Performed: Procedure(s): CORONARY ARTERY BYPASS GRAFTING (CABG), ON PUMP, TIMES FOUR, USING BILATERAL INTERNAL MAMMARY ARTERIES, RIGHT GREATER SAPHENOUS VEIN HARVESTED ENDOSCOPICALLY (N/A) TRANSESOPHAGEAL ECHOCARDIOGRAM (TEE) (N/A) RADIAL LEFT ARTERY HARVEST (Left)  Patient Location: ICU  Anesthesia Type:General  Level of Consciousness: Patient remains intubated per anesthesia plan  Airway & Oxygen Therapy: Patient remains intubated per anesthesia plan and Patient placed on Ventilator (see vital sign flow sheet for setting)  Post-op Assessment: Report given to RN  Post vital signs: Reviewed and stable  Last Vitals:  Vitals:   07/26/16 0810 07/26/16 0811  BP:    Pulse: (!) 53 (!) 52  Resp: 14 11  Temp:      Last Pain:  Vitals:   07/26/16 0630  TempSrc: Oral  PainSc:          Complications: No apparent anesthesia complications

## 2016-07-26 NOTE — Progress Notes (Signed)
  Echocardiogram Echocardiogram Transesophageal has been performed.  Donata Clay 07/26/2016, 9:22 AM

## 2016-07-26 NOTE — Brief Op Note (Addendum)
07/24/2016 - 07/26/2016  1:40 PM  PATIENT:  Nicholas Torres  55 y.o. male  PRE-OPERATIVE DIAGNOSIS:  CAD  POST-OPERATIVE DIAGNOSIS:  CAD  PROCEDURE:   CORONARY ARTERY BYPASS GRAFTING X 4  LIMA to LAD  RIMA to RCA  LEFT RADIAL to OM2  SVG to DIAGONAL 1 EVH Right thigh  SURGEON:  Surgeon(s) and Role:    * Melrose Nakayama, MD - Primary  PHYSICIAN ASSISTANT: WAYNE GOLD PA-C DONIELLE ZIMMERMAN PA-C   ANESTHESIA:   general  EBL:  Total I/O In: 1700 [I.V.:1700] Out: 1200 [Urine:1200]  BLOOD ADMINISTERED:none  DRAINS: ROUTINE   LOCAL MEDICATIONS USED:  NONE  SPECIMEN:  No Specimen  DISPOSITION OF SPECIMEN:  N/A  COUNTS:  YES  PLAN OF CARE: Admit to inpatient   PATIENT DISPOSITION:  ICU - intubated and hemodynamically stable.   Delay start of Pharmacological VTE agent (>24hrs) due to surgical blood loss or risk of bleeding: yes  COMPLICATIONS: NO KNOWN  XC= 78 min CPB= 109 min  Good conduits and targets

## 2016-07-26 NOTE — Anesthesia Procedure Notes (Addendum)
Procedure Name: Intubation Date/Time: 07/26/2016 8:48 AM Performed by: Barrington Ellison Pre-anesthesia Checklist: Patient identified, Emergency Drugs available, Suction available and Patient being monitored Patient Re-evaluated:Patient Re-evaluated prior to inductionOxygen Delivery Method: Circle System Utilized Preoxygenation: Pre-oxygenation with 100% oxygen Intubation Type: IV induction Ventilation: Mask ventilation without difficulty Laryngoscope Size: Mac and 4 Grade View: Grade I Tube type: Oral Tube size: 8.0 mm Number of attempts: 1 Airway Equipment and Method: Stylet and Oral airway Placement Confirmation: ETT inserted through vocal cords under direct vision,  positive ETCO2 and breath sounds checked- equal and bilateral Secured at: 23 cm Tube secured with: Tape Dental Injury: Teeth and Oropharynx as per pre-operative assessment

## 2016-07-26 NOTE — OR Nursing (Signed)
Forty-five minute call to SICU charge nurse at 1405. Spoke to [employer].

## 2016-07-26 NOTE — Progress Notes (Signed)
Pt waking up at this time; follow commands, but biting ET tube; PRN Versed given; Precedex resumed; pt shivering; bearing down; bite block applied at this time; will cont. To monitor.  Nicholas Torres

## 2016-07-26 NOTE — OR Nursing (Signed)
Twenty minute call to SICU charge nurse at 1435.

## 2016-07-26 NOTE — Procedures (Signed)
Extubation Procedure Note  Patient Details:   Name: Nicholas Torres DOB: 1960/12/10 MRN: [id number]   Airway Documentation:  Airway 8 mm (Active)  Secured at (cm) 25 cm 07/26/2016 10:22 PM  Measured From Lips 07/26/2016 10:22 PM  Secured Location Right 07/26/2016 10:22 PM  Secured By Pink Tape 07/26/2016 10:22 PM  Tube Holder Repositioned Yes 07/26/2016 10:00 PM  Site Condition Dry 07/26/2016 10:22 PM    Evaluation  O2 sats: stable throughout Complications: No apparent complications Patient did tolerate procedure well. Bilateral Breath Sounds: Clear   Yes  Jori Moll 07/26/2016, 10:55 PM   Patient was extubated to 4L nasal cannula following rapid wean protocol. Patient performed a FVC of .8L and a NIF of -21. Cuff leak was present prior to extubation and patient demonstrated ability to speak afterwards. Patient was unable to perform IS at time of extubation.

## 2016-07-26 NOTE — Anesthesia Procedure Notes (Addendum)
Central Venous Catheter Insertion Performed by: anesthesiologist 07/26/2016 8:04 AM Patient location: Pre-op. Preanesthetic checklist: patient identified, IV checked, risks and benefits discussed, surgical consent, monitors and equipment checked, pre-op evaluation, timeout performed and anesthesia consent Position: Trendelenburg Lidocaine 1% used for infiltration Landmarks identified and Seldinger technique used Catheter size: 8 Fr PA cath was placed.Sheath introducer Swan type and PA catheter depth:thermodilutionProcedure performed using ultrasound guided technique. Attempts: 1 Following insertion, line sutured and dressing applied. Post procedure assessment: blood return through all ports, free fluid flow and no air. Patient tolerated the procedure well with no immediate complications. Additional procedure comments: PA catheter:  Routine monitors. Timeout, sterile prep, drape, FBP R neck.  Trendelenburg position.  1% [place]ido local, finder and trocar RIJ 1st pass with US guidance.  Cordis placed over J wire. PA catheter in easily.  Sterile dressing applied.  Patient tolerated well, VSS.  Jenita Seashore, MD.

## 2016-07-27 ENCOUNTER — Inpatient Hospital Stay (HOSPITAL_COMMUNITY): Payer: BLUE CROSS/BLUE SHIELD

## 2016-07-27 ENCOUNTER — Encounter (HOSPITAL_COMMUNITY): Payer: Self-pay | Admitting: Thoracic Surgery (Cardiothoracic Vascular Surgery)

## 2016-07-27 LAB — BASIC METABOLIC PANEL
ANION GAP: 6 (ref 5–15)
BUN: 10 mg/dL (ref 6–20)
CO2: 22 mmol/L (ref 22–32)
Calcium: 8.3 mg/dL — ABNORMAL LOW (ref 8.9–10.3)
Chloride: 109 mmol/L (ref 101–111)
Creatinine, Ser: 1.13 mg/dL (ref 0.61–1.24)
GFR calc Af Amer: >60 mL/min (ref >60)
GLUCOSE: 129 mg/dL — AB (ref 65–99)
POTASSIUM: 4.5 mmol/L (ref 3.5–5.1)
Sodium: 137 mmol/L (ref 135–145)

## 2016-07-27 LAB — POCT I-STAT 3, ART BLOOD GAS (G3+)
ACID-BASE DEFICIT: 3 mmol/L — AB (ref 0.0–2.0)
Acid-base deficit: 4 mmol/L — ABNORMAL HIGH (ref 0.0–2.0)
BICARBONATE: 21.1 mmol/L (ref 20.0–28.0)
Bicarbonate: 21.9 mmol/L (ref 20.0–28.0)
O2 SAT: 99 %
O2 Saturation: 98 %
PCO2 ART: 42.3 mmHg (ref 32.0–48.0)
PH ART: 7.33 — AB (ref 7.350–7.450)
PO2 ART: 122 mmHg — AB (ref 83.0–108.0)
Patient temperature: 39
Patient temperature: 39.4
TCO2: 22 mmol/L (ref 0–100)
TCO2: 23 mmol/L (ref 0–100)
pCO2 arterial: 40.7 mmHg (ref 32.0–48.0)
pH, Arterial: 7.334 — ABNORMAL LOW (ref 7.350–7.450)
pO2, Arterial: 165 mmHg — ABNORMAL HIGH (ref 83.0–108.0)

## 2016-07-27 LAB — CBC
HCT: 32.2 % — ABNORMAL LOW (ref 39.0–52.0)
HCT: 30.4 % — ABNORMAL LOW (ref 39.0–52.0)
HEMOGLOBIN: 10.2 g/dL — AB (ref 13.0–17.0)
HEMOGLOBIN: 9.5 g/dL — AB (ref 13.0–17.0)
MCH: 29.9 pg (ref 26.0–34.0)
MCH: 29.8 pg (ref 26.0–34.0)
MCHC: 31.7 g/dL (ref 30.0–36.0)
MCHC: 31.3 g/dL (ref 30.0–36.0)
MCV: 94.4 fL (ref 78.0–100.0)
MCV: 95.3 fL (ref 78.0–100.0)
PLATELETS: 103 10*3/uL — AB (ref 150–400)
Platelets: 93 10*3/uL — ABNORMAL LOW (ref 150–400)
RBC: 3.41 MIL/uL — AB (ref 4.22–5.81)
RBC: 3.19 MIL/uL — ABNORMAL LOW (ref 4.22–5.81)
RDW: 12.5 % (ref 11.5–15.5)
RDW: 12.7 % (ref 11.5–15.5)
WBC: 8.1 10*3/uL (ref 4.0–10.5)
WBC: 9.8 10*3/uL (ref 4.0–10.5)

## 2016-07-27 LAB — GLUCOSE, CAPILLARY
GLUCOSE-CAPILLARY: 109 mg/dL — AB (ref 65–99)
GLUCOSE-CAPILLARY: 110 mg/dL — AB (ref 65–99)
Glucose-Capillary: 112 mg/dL — ABNORMAL HIGH (ref 65–99)
Glucose-Capillary: 131 mg/dL — ABNORMAL HIGH (ref 65–99)
Glucose-Capillary: 138 mg/dL — ABNORMAL HIGH (ref 65–99)
Glucose-Capillary: 93 mg/dL (ref 65–99)

## 2016-07-27 LAB — CREATININE, SERUM: CREATININE: 1.12 mg/dL (ref 0.61–1.24)

## 2016-07-27 LAB — POCT I-STAT, CHEM 8
BUN: 15 mg/dL (ref 6–20)
CHLORIDE: 98 mmol/L — AB (ref 101–111)
Calcium, Ion: 1.22 mmol/L (ref 1.15–1.40)
Creatinine, Ser: 1.1 mg/dL (ref 0.61–1.24)
Glucose, Bld: 128 mg/dL — ABNORMAL HIGH (ref 65–99)
HEMATOCRIT: 31 % — AB (ref 39.0–52.0)
Hemoglobin: 10.5 g/dL — ABNORMAL LOW (ref 13.0–17.0)
Potassium: 4.3 mmol/L (ref 3.5–5.1)
SODIUM: 137 mmol/L (ref 135–145)
TCO2: 25 mmol/L (ref 0–100)

## 2016-07-27 LAB — MAGNESIUM
MAGNESIUM: 2.4 mg/dL (ref 1.7–2.4)
MAGNESIUM: 2.6 mg/dL — AB (ref 1.7–2.4)

## 2016-07-27 MED ORDER — INSULIN ASPART 100 UNIT/ML ~~LOC~~ SOLN: 0.0000 [IU] | SUBCUTANEOUS | Status: DC

## 2016-07-27 MED ORDER — ALBUMIN HUMAN 5 % IV SOLN
12.5000 g | Freq: Once | INTRAVENOUS | Status: AC
  Administered 2016-07-27: 12.5 g via INTRAVENOUS
  Filled 2016-07-27: qty 250

## 2016-07-27 MED ORDER — METOCLOPRAMIDE HCL 5 MG/ML IJ SOLN
10.0000 mg | Freq: Four times a day (QID) | INTRAMUSCULAR | Status: AC
  Administered 2016-07-27 – 2016-07-28 (×4): 10 mg via INTRAVENOUS
  Filled 2016-07-27 (×4): qty 2

## 2016-07-27 MED ORDER — ISOSORBIDE MONONITRATE ER 30 MG PO TB24
30.0000 mg | ORAL_TABLET | Freq: Every day | ORAL | Status: DC
  Administered 2016-07-27 – 2016-07-31 (×5): 30 mg via ORAL
  Filled 2016-07-27 (×5): qty 1

## 2016-07-27 MED ORDER — ORAL CARE MOUTH RINSE
15.0000 mL | Freq: Two times a day (BID) | OROMUCOSAL | Status: DC
  Administered 2016-07-27 – 2016-07-31 (×5): 15 mL via OROMUCOSAL

## 2016-07-27 MED FILL — Electrolyte-R (PH 7.4) Solution: INTRAVENOUS | Qty: 3000 | Status: AC

## 2016-07-27 MED FILL — Sodium Bicarbonate IV Soln 8.4%: INTRAVENOUS | Qty: 50 | Status: AC

## 2016-07-27 MED FILL — Heparin Sodium (Porcine) Inj 1000 Unit/ML: INTRAMUSCULAR | Qty: 10 | Status: AC

## 2016-07-27 MED FILL — Sodium Chloride IV Soln 0.9%: INTRAVENOUS | Qty: 2000 | Status: AC

## 2016-07-27 MED FILL — Mannitol IV Soln 20%: INTRAVENOUS | Qty: 500 | Status: AC

## 2016-07-27 MED FILL — Lidocaine HCl IV Inj 20 MG/ML: INTRAVENOUS | Qty: 5 | Status: AC

## 2016-07-27 NOTE — Care Management Note (Signed)
Case Management Note  Patient Details  Name: Nicholas Torres MRN: [id number] Date of Birth: 03-13-1961  Subjective/Objective:    S/p post CABG                Action/Plan:  PTA independent from home with wife - wife will provide 24 hour supervision at discharge.  CM will continue to follow for discharge needs   Expected Discharge Date:                  Expected Discharge Plan:  Home/Self Care  In-House Referral:     Discharge planning Services  CM Consult  Post Acute Care Choice:    Choice offered to:     DME Arranged:    DME Agency:     HH Arranged:    HH Agency:     Status of Service:  In process, will continue to follow  If discussed at Long Length of Stay Meetings, dates discussed:    Additional Comments:  Maryclare Labrador, RN 07/27/2016, 3:33 PM

## 2016-07-27 NOTE — Progress Notes (Signed)
Patient ID: Nicholas Torres, male   DOB: 1961/05/13, 55 y.o.   MRN: [id number]   SICU Evening Rounds:  He is hemodynamically stable Atrial paced  sats 92% on 2L [place]  Urine output ok  BMET    Component Value Date/Time   NA 137 07/27/2016 1618   K 4.3 07/27/2016 1618   CL 98 (L) 07/27/2016 1618   CO2 22 07/27/2016 0400   GLUCOSE 128 (H) 07/27/2016 1618   BUN 15 07/27/2016 1618   CREATININE 1.10 07/27/2016 1618   CREATININE 1.17 07/10/2013 0948   CALCIUM 8.3 (L) 07/27/2016 0400   GFRNONAA >60 07/27/2016 1613   GFRAA >60 07/27/2016 1613   CBC    Component Value Date/Time   WBC 9.8 07/27/2016 1613   RBC 3.19 (L) 07/27/2016 1613   HGB 10.5 (L) 07/27/2016 1618   HCT 31.0 (L) 07/27/2016 1618   PLT 93 (L) 07/27/2016 1613   MCV 95.3 07/27/2016 1613   MCV 98.9 (A) 07/10/2013 1000   MCH 29.8 07/27/2016 1613   MCHC 31.3 07/27/2016 1613   RDW 12.7 07/27/2016 1613   LYMPHSABS 2.9 07/23/2016 0929   MONOABS 0.6 07/23/2016 0929   EOSABS 0.1 07/23/2016 0929   BASOSABS 0.0 07/23/2016 0929    Ambulated today

## 2016-07-27 NOTE — Op Note (Signed)
NAME:  Nicholas Torres, Nicholas Torres NO.:  [id number]  MEDICAL RECORD NO.:  [id number]  LOCATION:  2S11C                        FACILITY:  [facility]  PHYSICIAN:  Revonda Standard. Roxan Hockey, M.D.DATE OF BIRTH:  [birth year]  DATE OF PROCEDURE:  07/26/2016 DATE OF DISCHARGE:                              OPERATIVE REPORT   PREOPERATIVE DIAGNOSIS:  Three-vessel coronary artery disease, status post non-Q-wave myocardial infarction.  POSTOPERATIVE DIAGNOSIS:  Three-vessel coronary artery disease, status post non-Q-wave myocardial infarction.  PROCEDURES:  Median sternotomy, extracorporeal circulation Coronary artery bypass grafting x 4  Left internal mammary artery to left anterior descending,  Right internal mammary artery to right coronary,  Left radial artery to obtuse marginal 2,  Saphenous vein graft to diagonal 1. Endoscopic vein harvest, right thigh.  SURGEON:  Revonda Standard. Roxan Hockey, M.D.  ASSISTANT:  John Giovanni, P.A.-C.  SECOND ASSISTANT:  Lars Pinks, PA.  ANESTHESIA:  General.  FINDINGS:  Transesophageal echocardiography revealed mild, 1 to 2+, mitral regurgitation.  Ejection fraction approximately 50%.  Good-quality conduits.  Good-quality targets.  CLINICAL NOTE:  Nicholas Torres is a 55 year old man who presented with new- onset chest pain and ruled in for non-Q-wave MI.  At cardiac Catheterization he was found to have severe three-vessel coronary artery disease with total occlusion of the right coronary and OM2, and significant stenosis in the LAD and first diagonal.  He was referred for coronary artery bypass grafting.  The indications, risks, benefits and alternatives were discussed in detail with the patient.  He understood and accepted the risks and agreed to proceed.  The use of arterial conduits was discussed with the patient.  He understood the risks associated with radial artery harvest.  OPERATIVE NOTE:  Nicholas Torres was brought to the preoperative holding  area on July 26, 2016.  Anesthesia placed a Swan-Ganz catheter and an arterial blood pressure monitoring line. An Allen's test was performed with pulse oximetry confirming a small drop in the pulse oximetry tracing with radial occlusion, but continued pulsatile flow. This confirmed the results of the vascular lab assessment.  The patient was taken to the operating room, anesthetized, and intubated.  A Foley catheter was placed.  Transesophageal echocardiography was performed. Please see Dr. Lennox Grumbles separately dictated note for full details.  The chest, abdomen and left arm were prepped and draped in usual sterile fashion.  Intravenous antibiotics were administered.  A median sternotomy was performed.  The left internal mammary artery was harvested using standard technique.  Simultaneously, the left radial harvest incision was made over the volar aspect of the left wrist distally.  A short segment of the radial artery was dissected out. With proximal occlusion, there was a good Doppler signal in the palmar arch and the distal radial had a dopplerable signal as well.  The incision was extended to just below the antecubital fossa and the left radial artery was harvested using the Harmonic scalpel while the left mammary was harvested using standard technique.  2000 units of heparin was administered during the initial vessel harvest.  The left mammary was divided distally.  There was good flow.  The vessel did appear to be in spasm.  Papeverine was instilled intraluminally and  the flow significantly improved.  The distal radial stump was suture ligated with a 4-0 Prolene suture as was the proximal end.  The radial was cannulated, and all side branches were clipped.  The left arm incision was irrigated with warm saline and closed in 2 layers with a subcuticular skin closure.  The left arm then was tucked to the patient's side.  An incision made in the medial aspect of the right leg at the  level of the knee and the greater saphenous vein was harvested from the lower thigh endoscopically, it was a good-quality vessel.  The Rultract retractor was placed on the right side and the right internal mammary artery was harvested under direct vision using standard technique.  At the completion of the right mammary harvest, there was excellent flow through this vessel.  The remainder of the full heparin dose was given.  A sternal retractor was placed.  The pericardium was opened.  The ascending aorta was inspected.  It was of normal size with no atherosclerotic disease.  The aorta was cannulated via concentric 2-0 Ethibond pledgeted pursestring sutures.  A dual-stage venous cannula was placed via a pursestring suture in right atrial appendage. After confirming adequate anticoagulation with ACT measurement, cardiopulmonary bypass was initiated.  Flows were maintained per protocol.  The patient was cooled to 32 degrees Celsius.  The coronary arteries were inspected and anastomotic sites were chosen.  The conduits were inspected and cut to length and prepared.  A foam pad was placed in the pericardium to insulate the heart.  A temperature probe was placed in the myocardial septum and a cardioplegia cannula was placed in the ascending aorta.  The aorta was crossclamped.  The left ventricle was emptied via the aortic root vent.  Cardiac arrest then was achieved with a combination of cold antegrade blood cardioplegia and topical iced saline. After achieving a complete diastolic arrest and septal cooling to 12 degrees Celsius with 1.5 L of cardioplegia, a reversed saphenous vein graft was placed end-to-side to the first diagonal branch to the LAD.  This was a 1.5-mm good-quality target.  The vein was good quality.  An end-to-side anastomosis was performed with a running 7-0 Prolene suture.  All anastomoses were probed proximally and distally.  At completion of the anastomosis, cardioplegia  was administered down the vein graft.  There were good flow and good hemostasis.  Next, the distal end of the radial artery was anastomosed end-to-side to OM2.  This was a large dominant lateral branch. It was totally occluded proximally.  The radial was good quality.  It was anastomosed end-to-side with a running 8-0 Prolene suture.  Again, a probe passed easily proximally and distally, and there was good flow with cardioplegia administration and good hemostasis at the anastomosis.  After giving cardioplegia down the vein and the radial artery, the inferior wall of the heart was exposed.  The right internal mammary artery was brought through a window in the pericardium.  The distal end had previously been beveled. It was anastomosed end-to-side to the distal right coronary.  This was a 1.5-mm thin-walled good-quality vessel that was totally occluded proximally.  It gave rise to several distal branches.  The end-to-side anastomosis was performed with a running 8-0 Prolene suture.  At the completion of the anastomosis, the bulldog clamp was removed.  There was a good flush of blood distally.  The bulldog clamp was replaced and the mammary pedicle was tacked to the epicardial surface of the heart with 6-0  Prolene sutures.  After administering additional cardioplegia, the left internal mammary artery was brought through the window in the pericardium.  It was then anastomosed end-to-side to the distal LAD.  The LAD was a 2-mm good- quality target.  The mammary was a 1.5-mm good-quality conduit.  End-to- side anastomosis was performed with a running 8-0 Prolene suture.  At the completion of the mammary to LAD anastomosis, the bulldog clamp was removed.  A good flash of blood the seen distally, rapid septal rewarming was noted.  The bulldog clamp was replaced.  The mammary pedicle was tacked to the epicardial surface of the heart with 6-0 Prolene sutures.  Additional cardioplegia was  administered.  The vein graft was cut to length.  It was anastomosed end-to-side to a 4.5-mm punch aortotomy with a running 6-0 Prolene suture.  A venotomy then was made in the hood of the vein graft and the proximal anastomosis for the radial was performed end- to-side with a running 7-0 Prolene suture.  At the completion of this anastomosis, the patient was placed in Trendelenburg position. Lidocaine was administered.  The bulldog clamps were removed from the right mammary and left mammary.  De-airing was performed and the aortic crossclamp was removed.  The total crossclamp time was 78 minutes. While rewarming was completed, all proximal and distal anastomoses were inspected for hemostasis.  Epicardial pacing wires were placed on the right ventricle and right atrium.  When the patient had rewarmed to a core temperature of 37 degrees Celsius, he was weaned from cardiopulmonary bypass on the first attempt.  He was atrially paced at 80 beats per minute.  He did not require inotropic support.  The initial cardiac index was 3 liters/minute/meter squared.  The patient remained hemodynamically stable throughout the postbypass period.  A test dose of protamine was administered and was well tolerated.  The atrial and aortic cannulae were removed.  The remainder of the protamine was administered without incident.  The chest was irrigated with warm saline.  Hemostasis was achieved.  Postbypass transesophageal echocardiography was essentially unchanged from the prebypass study. The pericardium was reapproximated with interrupted 3-0 silk sutures. It came together easily without tension or kinking the underlying grafts.  Bilateral pleural tubes and the mediastinal chest tube were placed through separate subcostal incisions and secured with #1 silk sutures.  The sternum was closed with a combination of single and double heavy-gauge stainless steel wires.  The pectoralis fascia, subcutaneous tissue  and skin were closed in standard fashion.  All sponge, needle and instrument counts were correct at the end of the procedure.  The patient was taken from the operating room to the Surgical Intensive Care Unit, intubated and in good condition.     Revonda Standard Roxan Hockey, M.D.     SCH/MEDQ  D:  07/26/2016  T:  07/27/2016  Job:  [id number]

## 2016-07-27 NOTE — Progress Notes (Signed)
1 Day Post-Op Procedure(s) (LRB): CORONARY ARTERY BYPASS GRAFTING (CABG), ON PUMP, TIMES FOUR, USING BILATERAL INTERNAL MAMMARY ARTERIES, RIGHT GREATER SAPHENOUS VEIN HARVESTED ENDOSCOPICALLY (N/A) TRANSESOPHAGEAL ECHOCARDIOGRAM (TEE) (N/A) RADIAL LEFT ARTERY HARVEST (Left) Subjective: Some incisional pain. Denies nausea  Objective: Vital signs in last 24 hours: Temp:  [95.5 F (35.3 C)-103.3 F (39.6 C)] 99.7 F (37.6 C) (09/14 0800) Pulse Rate:  [35-90] 89 (09/14 0800) Cardiac Rhythm: Atrial paced (09/14 0800) Resp:  [11-39] 16 (09/14 0800) BP: (81-135)/(63-90) 97/70 (09/14 0800) SpO2:  [69 %-100 %] 99 % (09/14 0800) Arterial Line BP: (89-136)/(49-87) 107/57 (09/14 0800) FiO2 (%):  [40 %-50 %] 40 % (09/13 2222) Weight:  [191 lb 5.8 oz (86.8 kg)] 191 lb 5.8 oz (86.8 kg) (09/14 0500)  Hemodynamic parameters for last 24 hours: PAP: (18-60)/(7-28) 29/20 CO:  [3 L/min-5.6 L/min] 5.3 L/min CI:  [1.6 L/min/m2-2.9 L/min/m2] 2.8 L/min/m2  Intake/Output from previous day: 09/13 0701 - 09/14 0700 In: 5489.7 [I.V.:3974.7; Blood:305; NG/GT:60; IV Piggyback:1150] Out: 4610 [Urine:3230; Blood:800; Chest Tube:580] Intake/Output this shift: Total I/O In: 115 [I.V.:115] Out: -   General appearance: alert, cooperative and no distress Neurologic: intact Heart: regular rate and rhythm and + rub Lungs: diminished breath sounds bibasilar Abdomen: distended, tympanitic, nontender Extremities: Left hand neuro intact, good cap refill  Lab Results:  Recent Labs  07/26/16 2050 07/26/16 2055 07/27/16 0400  WBC 6.7  --  8.1  HGB 10.6* 10.5* 10.2*  HCT 33.6* 31.0* 32.2*  PLT 94*  --  103*   BMET:  Recent Labs  07/25/16 1227  07/26/16 2055 07/27/16 0400  NA 137  < > 138 137  K 3.8  < > 4.9 4.5  CL 101  < > 104 109  CO2 27  --   --  22  GLUCOSE 94  < > 154* 129*  BUN 11  < > 10 10  CREATININE 1.21  < > 1.00 1.13  CALCIUM 9.6  --   --  8.3*  < > = values in this interval not  displayed.  PT/INR:  Recent Labs  07/26/16 1515  LABPROT 18.7*  INR 1.55   ABG    Component Value Date/Time   PHART 7.330 (L) 07/27/2016 0002   HCO3 21.9 07/27/2016 0002   TCO2 23 07/27/2016 0002   ACIDBASEDEF 3.0 (H) 07/27/2016 0002   O2SAT 99.0 07/27/2016 0002   CBG (last 3)   Recent Labs  07/26/16 1949 07/26/16 2342 07/27/16 0336  GLUCAP 113* 93 109*    Assessment/Plan: S/P Procedure(s) (LRB): CORONARY ARTERY BYPASS GRAFTING (CABG), ON PUMP, TIMES FOUR, USING BILATERAL INTERNAL MAMMARY ARTERIES, RIGHT GREATER SAPHENOUS VEIN HARVESTED ENDOSCOPICALLY (N/A) TRANSESOPHAGEAL ECHOCARDIOGRAM (TEE) (N/A) RADIAL LEFT ARTERY HARVEST (Left) -  CV- good cardiac output- dc swan  Transition to Imdur for radial graft  ASA, beta blocker, statin  RESP_ IS for atelectasis  RENAL- creatinine and lytes OK  ENDO- CBG well controlled  GI- gastric dilatation on CXR, sips of clears, reglan  Dc CT  Anemia secondary to ABL- mild, follow  Thrombocytopenia- PLT up to 103K, no bleeding, use SCD for DVT prophylaxis, no lovenox for now  mobilize   LOS: 3 days    Nicholas Torres 07/27/2016

## 2016-07-28 ENCOUNTER — Inpatient Hospital Stay (HOSPITAL_COMMUNITY): Payer: BLUE CROSS/BLUE SHIELD

## 2016-07-28 LAB — GLUCOSE, CAPILLARY
GLUCOSE-CAPILLARY: 132 mg/dL — AB (ref 65–99)
Glucose-Capillary: 129 mg/dL — ABNORMAL HIGH (ref 65–99)
Glucose-Capillary: 164 mg/dL — ABNORMAL HIGH (ref 65–99)
Glucose-Capillary: 131 mg/dL — ABNORMAL HIGH (ref 65–99)
Glucose-Capillary: 128 mg/dL — ABNORMAL HIGH (ref 65–99)

## 2016-07-28 LAB — BASIC METABOLIC PANEL
Anion gap: 9 (ref 5–15)
BUN: 13 mg/dL (ref 6–20)
CHLORIDE: 100 mmol/L — AB (ref 101–111)
CO2: 24 mmol/L (ref 22–32)
CREATININE: 1.16 mg/dL (ref 0.61–1.24)
Calcium: 8.5 mg/dL — ABNORMAL LOW (ref 8.9–10.3)
GFR calc non Af Amer: >60 mL/min (ref >60)
Glucose, Bld: 130 mg/dL — ABNORMAL HIGH (ref 65–99)
POTASSIUM: 4.1 mmol/L (ref 3.5–5.1)
SODIUM: 133 mmol/L — AB (ref 135–145)

## 2016-07-28 LAB — CBC
HEMATOCRIT: 28.7 % — AB (ref 39.0–52.0)
Hemoglobin: 9 g/dL — ABNORMAL LOW (ref 13.0–17.0)
MCH: 29.8 pg (ref 26.0–34.0)
MCHC: 31.4 g/dL (ref 30.0–36.0)
MCV: 95 fL (ref 78.0–100.0)
PLATELETS: 101 10*3/uL — AB (ref 150–400)
RBC: 3.02 MIL/uL — AB (ref 4.22–5.81)
RDW: 12.7 % (ref 11.5–15.5)
WBC: 11.5 10*3/uL — AB (ref 4.0–10.5)

## 2016-07-28 MED ORDER — MOVING RIGHT ALONG BOOK
Freq: Once | Status: AC
  Administered 2016-07-28: 11:00:00
  Filled 2016-07-28: qty 1

## 2016-07-28 MED ORDER — ZOLPIDEM TARTRATE 5 MG PO TABS: 5.0000 mg | ORAL_TABLET | Freq: Every evening | ORAL | Status: DC | PRN

## 2016-07-28 MED ORDER — SODIUM CHLORIDE 0.9 % IV SOLN: 250.0000 mL | INTRAVENOUS | Status: DC | PRN

## 2016-07-28 MED ORDER — ALUM & MAG HYDROXIDE-SIMETH 200-200-20 MG/5ML PO SUSP: 15.0000 mL | ORAL | Status: DC | PRN

## 2016-07-28 MED ORDER — SODIUM CHLORIDE 0.9% FLUSH
3.0000 mL | Freq: Two times a day (BID) | INTRAVENOUS | Status: DC
  Administered 2016-07-28 – 2016-07-31 (×5): 3 mL via INTRAVENOUS

## 2016-07-28 MED ORDER — SODIUM CHLORIDE 0.9% FLUSH: 3.0000 mL | INTRAVENOUS | Status: DC | PRN

## 2016-07-28 MED ORDER — METOCLOPRAMIDE HCL 5 MG/ML IJ SOLN
10.0000 mg | Freq: Four times a day (QID) | INTRAMUSCULAR | Status: AC
Start: 2016-07-28 — End: 2016-07-29
  Administered 2016-07-28 – 2016-07-29 (×4): 10 mg via INTRAVENOUS
  Filled 2016-07-28 (×4): qty 2

## 2016-07-28 MED ORDER — MAGNESIUM HYDROXIDE 400 MG/5ML PO SUSP
30.0000 mL | Freq: Every day | ORAL | Status: DC | PRN
  Filled 2016-07-28: qty 30

## 2016-07-28 MED ORDER — INSULIN ASPART 100 UNIT/ML ~~LOC~~ SOLN
0.0000 [IU] | Freq: Three times a day (TID) | SUBCUTANEOUS | Status: DC
  Administered 2016-07-28 (×2): 2 [IU] via SUBCUTANEOUS

## 2016-07-28 NOTE — Progress Notes (Signed)
2 Days Post-Op Procedure(s) (LRB): CORONARY ARTERY BYPASS GRAFTING (CABG), ON PUMP, TIMES FOUR, USING BILATERAL INTERNAL MAMMARY ARTERIES, RIGHT GREATER SAPHENOUS VEIN HARVESTED ENDOSCOPICALLY (N/A) TRANSESOPHAGEAL ECHOCARDIOGRAM (TEE) (N/A) RADIAL LEFT ARTERY HARVEST (Left) Subjective: C/o incisional pain with deep breathing. Denies nausea  Objective: Vital signs in last 24 hours: Temp:  [98.4 F (36.9 C)-99.6 F (37.6 C)] 98.4 F (36.9 C) (09/15 0700) Pulse Rate:  [25-99] 90 (09/15 0800) Cardiac Rhythm: Normal sinus rhythm (09/15 0800) Resp:  [16-31] 19 (09/15 0800) BP: (90-122)/(57-81) 122/75 (09/15 0800) SpO2:  [43 %-100 %] 100 % (09/15 0800) Weight:  [190 lb 4.1 oz (86.3 kg)] 190 lb 4.1 oz (86.3 kg) (09/15 0600)  Hemodynamic parameters for last 24 hours:    Intake/Output from previous day: 09/14 0701 - 09/15 0700 In: 650.3 [I.V.:550.3; IV Piggyback:100] Out: 790 [Urine:790] Intake/Output this shift: No intake/output data recorded.  General appearance: alert, cooperative and appears uncomfortable Neurologic: intact Heart: regular rate and rhythm Lungs: diminished breath sounds bibasilar Abdomen: less distended, hypoactive BS  Lab Results:  Recent Labs  07/27/16 1613 07/27/16 1618 07/28/16 0400  WBC 9.8  --  11.5*  HGB 9.5* 10.5* 9.0*  HCT 30.4* 31.0* 28.7*  PLT 93*  --  101*   BMET:  Recent Labs  07/27/16 0400  07/27/16 1618 07/28/16 0400  NA 137  --  137 133*  K 4.5  --  4.3 4.1  CL 109  --  98* 100*  CO2 22  --   --  24  GLUCOSE 129*  --  128* 130*  BUN 10  --  15 13  CREATININE 1.13  < > 1.10 1.16  CALCIUM 8.3*  --   --  8.5*  < > = values in this interval not displayed.  PT/INR:  Recent Labs  07/26/16 1515  LABPROT 18.7*  INR 1.55   ABG    Component Value Date/Time   PHART 7.330 (L) 07/27/2016 0002   HCO3 21.9 07/27/2016 0002   TCO2 25 07/27/2016 1618   ACIDBASEDEF 3.0 (H) 07/27/2016 0002   O2SAT 99.0 07/27/2016 0002   CBG (last  3)   Recent Labs  07/27/16 2342 07/28/16 0329 07/28/16 0742  GLUCAP 138* 129* 164*    Assessment/Plan: S/P Procedure(s) (LRB): CORONARY ARTERY BYPASS GRAFTING (CABG), ON PUMP, TIMES FOUR, USING BILATERAL INTERNAL MAMMARY ARTERIES, RIGHT GREATER SAPHENOUS VEIN HARVESTED ENDOSCOPICALLY (N/A) TRANSESOPHAGEAL ECHOCARDIOGRAM (TEE) (N/A) RADIAL LEFT ARTERY HARVEST (Left) Plan for transfer to step-down: see transfer orders  CV- stable, in SR  ASA, beta blocker, statin  Add ACE-I prior to dc  Imdur for 6 weeks  RESP- bibasilar atelectasis- IS  RENAL- creatinine and lytes OK  GI- ileus, improving, continue Reglan for another 24 hours  Advance diet as tolerated  Anemia- mild follow  Thrombocytopenia- PLT count up today, continue to avoid heparin, etc  Increase ambulation   LOS: 4 days    Nicholas Torres 07/28/2016

## 2016-07-28 NOTE — Discharge Instructions (Signed)
Coronary Artery Bypass Grafting, Care After °Refer to this sheet in the next few weeks. These instructions provide you with information on caring for yourself after your procedure. Your health care provider may also give you more specific instructions. Your treatment has been planned according to current medical practices, but problems sometimes occur. Call your health care provider if you have any problems or questions after your procedure. °WHAT TO EXPECT AFTER THE PROCEDURE °Recovery from surgery will be different for everyone. Some people feel well after 3 or 4 weeks, while for others it takes longer. After your procedure, it is typical to have the following: °· Nausea and a lack of appetite.   °· Constipation. °· Weakness and fatigue.   °· Depression or irritability.   °· Pain or discomfort at your incision site. °HOME CARE INSTRUCTIONS °· Take medicines only as directed by your health care provider. Do not stop taking medicines or start any new medicines without first checking with your health care provider. °· Take your pulse as directed by your health care provider. °· Perform deep breathing as directed by your health care provider. If you were given a device called an incentive spirometer, use it to practice deep breathing several times a day. Support your chest with a pillow or your arms when you take deep breaths or cough. °· Keep incision areas clean, dry, and protected. Remove or change any bandages (dressings) only as directed by your health care provider. You may have skin adhesive strips over the incision areas. Do not take the strips off. They will fall off on their own. °· Check incision areas daily for any swelling, redness, or drainage. °· If incisions were made in your legs, do the following: °¨ Avoid crossing your legs.   °¨ Avoid sitting for long periods of time. Change positions every 30 minutes.   °¨ Elevate your legs when you are sitting. °· Wear compression stockings as directed by your  health care provider. These stockings help keep blood clots from forming in your legs. °· Take showers once your health care provider approves. Until then, only take sponge baths. Pat incisions dry. Do not rub incisions with a washcloth or towel. Do not take baths, swim, or use a hot tub until your health care provider approves. °· Eat foods that are high in fiber, such as raw fruits and vegetables, whole grains, beans, and nuts. Meats should be lean cut. Avoid canned, processed, and fried foods. °· Drink enough fluid to keep your urine clear or pale yellow. °· Weigh yourself every day. This helps identify if you are retaining fluid that may make your heart and lungs work harder. °· Rest and limit activity as directed by your health care provider. You may be instructed to: °¨ Stop any activity at once if you have chest pain, shortness of breath, irregular heartbeats, or dizziness. Get help right away if you have any of these symptoms. °¨ Move around frequently for short periods or take short walks as directed by your health care provider. Increase your activities gradually. You may need physical therapy or cardiac rehabilitation to help strengthen your muscles and build your endurance. °¨ Avoid lifting, pushing, or pulling anything heavier than 10 lb (4.5 kg) for at least 6 weeks after surgery. °· Do not drive until your health care provider approves.  °· Ask your health care provider when you may return to work. °· Ask your health care provider when you may resume sexual activity. °· Keep all follow-up visits as directed by your health care   provider. This is important. °SEEK MEDICAL CARE IF: °· You have swelling, redness, increasing pain, or drainage at the site of an incision. °· You have a fever. °· You have swelling in your ankles or legs. °· You have pain in your legs.   °· You gain 2 or more pounds (0.9 kg) a day. °· You are nauseous or vomit. °· You have diarrhea.  °SEEK IMMEDIATE MEDICAL CARE IF: °· You have  chest pain that goes to your jaw or arms. °· You have shortness of breath.   °· You have a fast or irregular heartbeat.   °· You notice a "clicking" in your breastbone (sternum) when you move.   °· You have numbness or weakness in your arms or legs. °· You feel dizzy or light-headed.   °MAKE SURE YOU: °· Understand these instructions. °· Will watch your condition. °· Will get help right away if you are not doing well or get worse. °  °This information is not intended to replace advice given to you by your health care provider. Make sure you discuss any questions you have with your health care provider. °  °Document Released: 05/19/2005 Document Revised: 11/20/2014 Document Reviewed: 04/08/2013 °[employer] Interactive Patient Education ©2016 [employer] ° °

## 2016-07-28 NOTE — Discharge Summary (Signed)
Physician Discharge Summary       [address]       [place] [address]             [phone]    Patient ID: Nicholas Torres MRN: [id number] DOB/AGE: 03-24-1961 55 y.o.  Admit date: 07/24/2016 Discharge date: 07/31/2016  Admission Diagnoses: 1.NSTEMI (non-ST elevated myocardial infarction) ([place]) 2. CAD (coronary artery disease)  Active Diagnoses:  1. Hyperlipidemia 2. Hypertension 3. ABL anemia 4. Thrombocytopenia   Procedure (s):  Left Heart Cath and Coronary Angiography by Dr. Humphrey Rolls on 07/24/2016:  Conclusion     Mid RCA lesion, 100 %stenosed.  Mid Cx lesion, 100 %stenosed.  Ost 1st Diag lesion, 80 %stenosed.  Dist LAD lesion, 70 %stenosed.   3 vessel disease with occluded mid RCA and mid LCX with collaterals and high grade lesion proximally and mid LAD with preserved LVEF. Advise CABG at [facility].    Median sternotomy, extracorporeal circulation, coronary artery bypass grafting x4 (left internal mammary artery to left anterior descending artery, right internal mammary artery to right coronary, left radial artery to obtuse marginal 2, saphenous vein graft to diagonal 1). Endoscopic vein harvest, right thigh by Dr. Roxan Hockey on 07/26/2016.  History of Presenting Illness: is a 55 yo [place] American Male  with a known history of HTN and Hyperlipidemia.  He presented to who presented to [facility] ED with complaints of intermittent exertional chest discomfort with associated shortness of breath that has been occurring over the past 2 weeks.  EKG showed some ST-T depression.  His troponin was elevated and he was admitted for further work up.  Cardiology consult was obtained and he was ultimately taken to the cardiac catheterization lab where he was found to have multivessel CAD.  It was felt coronary bypass grafting would be indicated and he was transferred to [facility] for surgical evaluation.  Currently the patient is chest pain free.  He  denies history of DM and nicotine abuse.  He has a positive family history of CAD.  He is physically active and is able to perform his daily activities without difficulty. Dr. Roxan Hockey discussed the need for coronary artery bypass grafting surgery. Potential risks, benefits, and complications were discussed with the patient and he agreed to proceed with surgery. Pre operative carotid duplex showed no significant internal carotid artery stenosis bilaterally. He underwent a CABG x 4 on 07/26/2016.  Brief Hospital Course:  The patient was extubated late the evening of surgery without difficulty. He/she remained afebrile and hemodynamically stable. Gordy Councilman, a line, chest tubes, and foley were removed early in the post operative course. He had gastric dilatation on chest x ray. He was put on Reglan and diet was slowly advanced as tolerated. Lopressor was started and titrated accordingly. He was put on Imdur secondary to left radial artery harvest. This will be continued for 6 weeks. He was volume over loaded and diuresed. He had ABL anemia. He did not require a post op transfusion. His last H and H was 8.6 and 27.1. He also had thrombocytopenia. His last platelet count was up to 114,000. He was weaned . He was weaned off the insulin drip. The patient's HGA1C pre op was  5.8 . The patient was felt surgically stable for transfer from the ICU to [facility] for further convalescence on 07/28/2016. He continues to progress with cardiac rehab. He was ambulating on 1 liter of oxygen but was later weaned to room air. He has been tolerating a  diet and has had a bowel movement. Epicardial pacing wires were removed on 09/17. Chest tube sutures will be removed the day of discharge. The patient is felt surgically stable for discharge today.    Latest Vital Signs: Blood pressure 119/83, pulse 88, temperature 98.9 F (37.2 C), temperature source Oral, resp. rate 16, height 5\' 5"  (1.651 m), weight 180 lb 14.4 oz (82.1 kg),  SpO2 97 %.  Physical Exam: General appearance: alert, cooperative and appears uncomfortable Neurologic: intact Heart: regular rate and rhythm Lungs: diminished breath sounds bibasilar Abdomen: less distended, hypoactive BS   Discharge Condition:Stable and discharged to home.  Recent laboratory studies:  Lab Results  Component Value Date   WBC 11.6 (H) 07/29/2016   HGB 8.6 (L) 07/29/2016   HCT 27.1 (L) 07/29/2016   MCV 94.4 07/29/2016   PLT 114 (L) 07/29/2016   Lab Results  Component Value Date   NA 137 07/29/2016   K 3.9 07/29/2016   CL 101 07/29/2016   CO2 25 07/29/2016   CREATININE 1.11 07/29/2016   GLUCOSE 113 (H) 07/29/2016    Diagnostic Studies:  EXAM: CHEST  2 VIEW  COMPARISON:  07/28/2016  FINDINGS: Sternotomy wires overlie normal cardiac silhouette. There is band of atelectasis in the LEFT lower lobe unchanged. Small bilateral pleural effusions. No pneumothorax. Removal of IJ sheath.  IMPRESSION: 1. Improved aeration to lung bases. 2. Persistent LEFT lower lobe atelectasis and small effusions.   Electronically Signed   By: Suzy Bouchard M.D.   On: 07/29/2016 07:23   Discharge Medications:   Medication List    STOP taking these medications   atenolol 25 MG tablet Commonly known as:  TENORMIN   atorvastatin 80 MG tablet Commonly known as:  LIPITOR   enoxaparin 150 MG/ML injection Commonly known as:  LOVENOX   ibuprofen 200 MG tablet Commonly known as:  ADVIL,MOTRIN   meloxicam 15 MG tablet Commonly known as:  MOBIC     TAKE these medications   aspirin 325 MG EC tablet Take 1 tablet (325 mg total) by mouth daily. What changed:  medication strength  how much to take   fluticasone 50 MCG/ACT nasal spray Commonly known as:  FLONASE Place 1 spray into both nostrils at bedtime as needed for rhinitis.   furosemide 40 MG tablet Commonly known as:  LASIX Take 1 tablet (40 mg total) by mouth daily. For 5 days then stop.     guaiFENesin 600 MG 12 hr tablet Commonly known as:  MUCINEX Take 1 tablet (600 mg total) by mouth 2 (two) times daily as needed for cough.   isosorbide mononitrate 30 MG 24 hr tablet Commonly known as:  IMDUR Take 1 tablet (30 mg total) by mouth daily.   metoprolol tartrate 25 MG tablet Commonly known as:  LOPRESSOR Take 1.5 tablets (37.5 mg total) by mouth 2 (two) times daily. What changed:  how much to take   oxyCODONE 5 MG immediate release tablet Commonly known as:  Oxy IR/ROXICODONE Take 1-2 tablets (5-10 mg total) by mouth every 4 (four) hours as needed for severe pain.   potassium chloride SA 20 MEQ tablet Commonly known as:  K-DUR,KLOR-CON Take 1 tablet (20 mEq total) by mouth daily. For 5 days then stop.   rosuvastatin 10 MG tablet Commonly known as:  CRESTOR Take 10 mg by mouth daily.   sildenafil 50 MG tablet Commonly known as:  VIAGRA Take 1 tablet (50 mg total) by mouth daily as needed for erectile dysfunction.  The patient has been discharged on:   1.Beta Blocker:  Yes [  x ]                              No   [   ]                              If No, reason:  2.Ace Inhibitor/ARB: Yes [   ]                                     No  [ x   ]                                     If No, reason:Labile BP, hope to start as an outpatient  3.Statin:   Yes [ x  ]                  No  [   ]                  If No, reason:  4.Ecasa:  Yes  [ x  ]                  No   [   ]                  If No, reason:  Follow Up Appointments: Follow-up Information    Dionisio David, MD .   Specialty:  Cardiology Why:  Call for a 2 week follow up appointment Contact information: [place] [address] [phone]        Melrose Nakayama, MD Follow up on 08/29/2016.   Specialty:  Cardiothoracic Surgery Why:  PA/LAT CXR to be taken (at [facility] which is in the same building as Dr. Leonarda Salon office) on 08/29/2016 at 11:00  am;Appointment time is at 11:30 am  Contact information: [place] [address] [phone]           Signed: Lars Pinks MPA-C 07/31/2016, 7:42 AM

## 2016-07-29 ENCOUNTER — Inpatient Hospital Stay (HOSPITAL_COMMUNITY): Payer: BLUE CROSS/BLUE SHIELD

## 2016-07-29 LAB — BASIC METABOLIC PANEL
Anion gap: 11 (ref 5–15)
BUN: 16 mg/dL (ref 6–20)
CALCIUM: 8.9 mg/dL (ref 8.9–10.3)
CO2: 25 mmol/L (ref 22–32)
Chloride: 101 mmol/L (ref 101–111)
Creatinine, Ser: 1.11 mg/dL (ref 0.61–1.24)
GFR calc Af Amer: >60 mL/min (ref >60)
GLUCOSE: 113 mg/dL — AB (ref 65–99)
Potassium: 3.9 mmol/L (ref 3.5–5.1)
Sodium: 137 mmol/L (ref 135–145)

## 2016-07-29 LAB — CBC
HCT: 27.1 % — ABNORMAL LOW (ref 39.0–52.0)
Hemoglobin: 8.6 g/dL — ABNORMAL LOW (ref 13.0–17.0)
MCH: 30 pg (ref 26.0–34.0)
MCHC: 31.7 g/dL (ref 30.0–36.0)
MCV: 94.4 fL (ref 78.0–100.0)
PLATELETS: 114 10*3/uL — AB (ref 150–400)
RBC: 2.87 MIL/uL — ABNORMAL LOW (ref 4.22–5.81)
RDW: 12.6 % (ref 11.5–15.5)
WBC: 11.6 10*3/uL — ABNORMAL HIGH (ref 4.0–10.5)

## 2016-07-29 LAB — GLUCOSE, CAPILLARY
Glucose-Capillary: 115 mg/dL — ABNORMAL HIGH (ref 65–99)
Glucose-Capillary: 134 mg/dL — ABNORMAL HIGH (ref 65–99)

## 2016-07-29 MED ORDER — POTASSIUM CHLORIDE CRYS ER 20 MEQ PO TBCR
20.0000 meq | EXTENDED_RELEASE_TABLET | Freq: Every day | ORAL | Status: DC
  Administered 2016-07-29 – 2016-07-31 (×3): 20 meq via ORAL
  Filled 2016-07-29 (×3): qty 1

## 2016-07-29 MED ORDER — POTASSIUM CHLORIDE CRYS ER 20 MEQ PO TBCR
20.0000 meq | EXTENDED_RELEASE_TABLET | Freq: Once | ORAL | Status: AC
  Administered 2016-07-29: 20 meq via ORAL
  Filled 2016-07-29: qty 1

## 2016-07-29 MED ORDER — METOPROLOL TARTRATE 25 MG PO TABS
25.0000 mg | ORAL_TABLET | Freq: Two times a day (BID) | ORAL | Status: DC
  Administered 2016-07-29 (×2): 25 mg via ORAL
  Filled 2016-07-29 (×2): qty 1

## 2016-07-29 MED ORDER — FUROSEMIDE 40 MG PO TABS
40.0000 mg | ORAL_TABLET | Freq: Every day | ORAL | Status: DC
  Administered 2016-07-29 – 2016-07-31 (×3): 40 mg via ORAL
  Filled 2016-07-29 (×3): qty 1

## 2016-07-29 NOTE — Progress Notes (Signed)
CARDIAC REHAB PHASE I   PRE:  Rate/Rhythm: 97  BP:  Sitting: 132/94     SaO2: 96% 1 liter  MODE:  Ambulation: 250 ft   POST:  Rate/Rhythm: 104  BP:  Sitting: 134/98     SaO2: 93% 1 liter  1140am-12:05pm Patient ambulated independently with the assistance of a walker. Patient complained of overall fatigue. Stressed IS use. Encouraged to walk 3 times a day. Placed in chair with family at side.   [place], MS 07/29/2016 12:00 PM

## 2016-07-29 NOTE — Progress Notes (Addendum)
      [address]       [place] [address]             [phone]        3 Days Post-Op Procedure(s) (LRB): CORONARY ARTERY BYPASS GRAFTING (CABG), ON PUMP, TIMES FOUR, USING BILATERAL INTERNAL MAMMARY ARTERIES, RIGHT GREATER SAPHENOUS VEIN HARVESTED ENDOSCOPICALLY (N/A) TRANSESOPHAGEAL ECHOCARDIOGRAM (TEE) (N/A) RADIAL LEFT ARTERY HARVEST (Left)  Subjective: Patient hungry-asking for grits  Objective: Vital signs in last 24 hours: Temp:  [98.2 F (36.8 C)-100 F (37.8 C)] 98.8 F (37.1 C) (09/16 0441) Pulse Rate:  [89-113] 104 (09/16 0441) Cardiac Rhythm: Normal sinus rhythm (09/16 0759) Resp:  [18-27] 18 (09/16 0441) BP: (98-128)/(65-87) 128/78 (09/16 0441) SpO2:  [84 %-100 %] 93 % (09/16 0441) Weight:  [187 lb (84.8 kg)] 187 lb (84.8 kg) (09/16 0441)  Pre op weight 81.4 kg Current Weight  07/29/16 187 lb (84.8 kg)      Intake/Output from previous day: 09/15 0701 - 09/16 0700 In: 100 [I.V.:100] Out: -    Physical Exam:  Cardiovascular: Slightly tachy Pulmonary: Slightly diminished at bases L.R Abdomen: Soft, non tender, bowel sounds present. Extremities: Mild bilateral lower extremity edema. Left forearm wound is clean and dry. Motor-sensory intact. Wounds: Clean and dry.  No erythema or signs of infection.  Lab Results: CBC: Recent Labs  07/28/16 0400 07/29/16 0400  WBC 11.5* 11.6*  HGB 9.0* 8.6*  HCT 28.7* 27.1*  PLT 101* 114*   BMET:  Recent Labs  07/28/16 0400 07/29/16 0400  NA 133* 137  K 4.1 3.9  CL 100* 101  CO2 24 25  GLUCOSE 130* 113*  BUN 13 16  CREATININE 1.16 1.11  CALCIUM 8.5* 8.9    PT/INR:  Lab Results  Component Value Date   INR 1.55 07/26/2016   INR 1.17 07/25/2016   INR 1.11 07/23/2016   ABG:  INR: Will add last result for INR, ABG once components are confirmed Will add last 4 CBG results once components are confirmed  Assessment/Plan:  1. CV - ST in the low 100's. On Lopressor 12.5 mg bid  and Imdur 30 mg daily. Will increase Lopressor to 25 mg bid for better HR control.  2.  Pulmonary - On 1 liter of oxygen via [place]. Wean to room air as tolerates. CXR this am shows no pneumothorax, LLL atelectasis, small bilateral pleural effusions. Encourage incentive spirometer. 3. Volume Overload - Will give Lasix 40 mg daily. 4.  Acute blood loss anemia - H and H 8.6 and 27.1 5. Mild thrombocytopenia-platelets up to 114,000 6. Supplement potassium 7. CBGs 128/134/115. Pre op HGA1C 5.8. Stop accu checks and SS PRN 8. Remove EPW in am 9. Will advance diet at patient request-no abdominal pain, nausea. Had bowel movement.  ZIMMERMAN,DONIELLE MPA-C 07/29/2016,8:10 AM Progressing well Ambulated 250 feet Probable discharge Monday patient examined and medical record reviewed,agree with above note. Tharon Aquas Trigt III 07/29/2016

## 2016-07-30 LAB — GLUCOSE, CAPILLARY
Glucose-Capillary: 111 mg/dL — ABNORMAL HIGH (ref 65–99)
Glucose-Capillary: 102 mg/dL — ABNORMAL HIGH (ref 65–99)

## 2016-07-30 MED ORDER — METOPROLOL TARTRATE 25 MG PO TABS
37.5000 mg | ORAL_TABLET | Freq: Two times a day (BID) | ORAL | Status: DC
  Administered 2016-07-30 – 2016-07-31 (×3): 37.5 mg via ORAL
  Filled 2016-07-30 (×3): qty 1

## 2016-07-30 NOTE — Progress Notes (Signed)
07/30/2016 1750 EPW D/C'd per order and per protocol.  Ends intact. Pt. Tolerated well.  Advised bedrest x1hr.  Call bell in reach.  Vital signs collected per protocol. [place]

## 2016-07-30 NOTE — Progress Notes (Signed)
      [address]       [place] [address]             [phone]        4 Days Post-Op Procedure(s) (LRB): CORONARY ARTERY BYPASS GRAFTING (CABG), ON PUMP, TIMES FOUR, USING BILATERAL INTERNAL MAMMARY ARTERIES, RIGHT GREATER SAPHENOUS VEIN HARVESTED ENDOSCOPICALLY (N/A) TRANSESOPHAGEAL ECHOCARDIOGRAM (TEE) (N/A) RADIAL LEFT ARTERY HARVEST (Left)  Subjective: Patient without complaints this am.  Objective: Vital signs in last 24 hours: Temp:  [98.9 F (37.2 C)-99.3 F (37.4 C)] 98.9 F (37.2 C) (09/17 0427) Pulse Rate:  [94-100] 97 (09/17 0427) Cardiac Rhythm: Junctional rhythm (09/16 1900) Resp:  [18-19] 18 (09/17 0427) BP: (123-128)/(70-80) 128/80 (09/17 0427) SpO2:  [91 %-98 %] 91 % (09/17 0427) Weight:  [184 lb 4.9 oz (83.6 kg)] 184 lb 4.9 oz (83.6 kg) (09/17 0427)  Pre op weight 81.4 kg Current Weight  07/30/16 184 lb 4.9 oz (83.6 kg)      Intake/Output from previous day: No intake/output data recorded.   Physical Exam:  Cardiovascular: Slightly tachy Pulmonary: Slightly diminished at bases L>R Abdomen: Soft, non tender, bowel sounds present. Extremities: Mild bilateral lower extremity edema. Left forearm wound is clean and dry. Motor-sensory intact.  Wounds: Clean and dry.  No erythema or signs of infection.  Lab Results: CBC:  Recent Labs  07/28/16 0400 07/29/16 0400  WBC 11.5* 11.6*  HGB 9.0* 8.6*  HCT 28.7* 27.1*  PLT 101* 114*   BMET:   Recent Labs  07/28/16 0400 07/29/16 0400  NA 133* 137  K 4.1 3.9  CL 100* 101  CO2 24 25  GLUCOSE 130* 113*  BUN 13 16  CREATININE 1.16 1.11  CALCIUM 8.5* 8.9    PT/INR:  Lab Results  Component Value Date   INR 1.55 07/26/2016   INR 1.17 07/25/2016   INR 1.11 07/23/2016   ABG:  INR: Will add last result for INR, ABG once components are confirmed Will add last 4 CBG results once components are confirmed  Assessment/Plan:  1. CV - ST in the low 100's. On Lopressor 25  mg bid and Imdur 30 mg daily. Will increase Lopressor to 37.5 mg bid for better HR control.  2.  Pulmonary - On 1 liter of oxygen via [place]. Wean to room air as tolerates.  Encourage incentive spirometer. 3. Volume Overload - Continue Lasix 40 mg daily. 4.  Acute blood loss anemia - H and H 8.6 and 27.1 5. Mild thrombocytopenia-last platelets up to 114,000 6. Remove EPW  7. Likely discharge in am  ZIMMERMAN,DONIELLE MPA-C 07/30/2016,7:51 AM

## 2016-07-31 MED ORDER — FUROSEMIDE 40 MG PO TABS: 40.0000 mg | ORAL_TABLET | Freq: Every day | ORAL | 0 refills | Status: DC

## 2016-07-31 MED ORDER — ISOSORBIDE MONONITRATE ER 30 MG PO TB24: 30.0000 mg | ORAL_TABLET | Freq: Every day | ORAL | 0 refills | Status: DC

## 2016-07-31 MED ORDER — GUAIFENESIN ER 600 MG PO TB12: 600.0000 mg | ORAL_TABLET | Freq: Two times a day (BID) | ORAL | Status: DC | PRN

## 2016-07-31 MED ORDER — POTASSIUM CHLORIDE CRYS ER 20 MEQ PO TBCR: 20.0000 meq | EXTENDED_RELEASE_TABLET | Freq: Every day | ORAL | 0 refills | Status: DC

## 2016-07-31 MED ORDER — OXYCODONE HCL 5 MG PO TABS: 5.0000 mg | ORAL_TABLET | ORAL | 0 refills | Status: DC | PRN

## 2016-07-31 MED ORDER — METOPROLOL TARTRATE 25 MG PO TABS: 37.5000 mg | ORAL_TABLET | Freq: Two times a day (BID) | ORAL | 1 refills | Status: DC

## 2016-07-31 MED ORDER — ASPIRIN 325 MG PO TBEC: 325.0000 mg | DELAYED_RELEASE_TABLET | Freq: Every day | ORAL | 0 refills | Status: DC

## 2016-07-31 NOTE — Progress Notes (Addendum)
      [address]       [place] [address]             [phone]        5 Days Post-Op Procedure(s) (LRB): CORONARY ARTERY BYPASS GRAFTING (CABG), ON PUMP, TIMES FOUR, USING BILATERAL INTERNAL MAMMARY ARTERIES, RIGHT GREATER SAPHENOUS VEIN HARVESTED ENDOSCOPICALLY (N/A) TRANSESOPHAGEAL ECHOCARDIOGRAM (TEE) (N/A) RADIAL LEFT ARTERY HARVEST (Left)  Subjective: Patient has occasional cough and then has sternal pain afterward.  Objective: Vital signs in last 24 hours: Temp:  [98.7 F (37.1 C)-98.9 F (37.2 C)] 98.9 F (37.2 C) (09/18 0500) Pulse Rate:  [88-99] 88 (09/18 0500) Cardiac Rhythm: Normal sinus rhythm (09/17 1900) Resp:  [16-18] 16 (09/18 0500) BP: (97-119)/(60-83) 119/83 (09/18 0500) SpO2:  [93 %-100 %] 97 % (09/18 0500) Weight:  [180 lb 14.4 oz (82.1 kg)] 180 lb 14.4 oz (82.1 kg) (09/18 0500)  Pre op weight 81.4 kg Current Weight  07/31/16 180 lb 14.4 oz (82.1 kg)      Intake/Output from previous day: 09/17 0701 - 09/18 0700 In: 350 [P.O.:350] Out: -    Physical Exam:  Cardiovascular: Slightly tachy Pulmonary: Slightly diminished at bases L>R Abdomen: Soft, non tender, bowel sounds present. Extremities: Mild bilateral lower extremity edema. Left forearm wound is clean and dry. Motor-sensory intact.  Wounds: Clean and dry.  No erythema or signs of infection.  Lab Results: CBC:  Recent Labs  07/29/16 0400  WBC 11.6*  HGB 8.6*  HCT 27.1*  PLT 114*   BMET:   Recent Labs  07/29/16 0400  NA 137  K 3.9  CL 101  CO2 25  GLUCOSE 113*  BUN 16  CREATININE 1.11  CALCIUM 8.9    PT/INR:  Lab Results  Component Value Date   INR 1.55 07/26/2016   INR 1.17 07/25/2016   INR 1.11 07/23/2016   ABG:  INR: Will add last result for INR, ABG once components are confirmed Will add last 4 CBG results once components are confirmed  Assessment/Plan:  1. CV - ST in the low 100's this am. On Lopressor 37.5 mg bid and Imdur 30 mg  daily. 2.  Pulmonary - On room air. Encourage incentive spirometer. Mucinex PRN cough. 3. Volume Overload - Continue Lasix 40 mg daily. 4.  Acute blood loss anemia - H and H 8.6 and 27.1 5. Mild thrombocytopenia-last platelets up to 114,000 6. Discharge  ZIMMERMAN,DONIELLE MPA-C 07/31/2016,7:17 AM  Patient seen and examined, agree with above Will follow up with Dr. Humphrey Rolls in [place], [place] next week I will see him back in about 3 weeks  Remo Lipps C. Roxan Hockey, MD [facility] [phone]

## 2016-07-31 NOTE — Care Management Note (Signed)
Case Management Note Previous CM note initiated by Maryclare Labrador, RN 07/27/2016, 3:33 PM  Patient Details  Name: Nicholas Torres MRN: [id number] Date of Birth: [birth year]  Subjective/Objective:    S/p post CABG                Action/Plan:  PTA independent from home with wife - wife will provide 24 hour supervision at discharge.  CM will continue to follow for discharge needs   Expected Discharge Date:    07/31/16              Expected Discharge Plan:  Home/Self Care  In-House Referral:     Discharge planning Services  CM Consult  Post Acute Care Choice:    Choice offered to:     DME Arranged:    DME Agency:     HH Arranged:    [place] Agency:     Status of Service:  Completed, signed off  If discussed at [place] of Stay Meetings, dates discussed:    Discharge Disposition: Home self care   Additional Comments:  Dawayne Patricia, RN 07/31/2016, 10:56 AM [phone]

## 2016-07-31 NOTE — Progress Notes (Signed)
CARDIAC REHAB PHASE I   PRE:  Rate/Rhythm: 111 Sinus Tach  BP:  Sitting: 126/82       SaO2: 97% RA  MODE:  Ambulation: 700 ft   POST:  Rate/Rhythm: 109 SR  BP:  Sitting: 95% RA        SaO2: 128/85  Pt walked 700 ft w/o assist and tolerated well.  Reviewed post op ed with patient and wife, sternal precautions, IS use and diet/ex.  Discussed CRPII, patient was accepting.  Will refer to [place].  Pt to recliner after walk with call bell in reach. [place], [place] 07/31/2016 9:28 AM

## 2016-08-07 DIAGNOSIS — Z736 Limitation of activities due to disability: Secondary | ICD-10-CM

## 2016-08-08 ENCOUNTER — Other Ambulatory Visit: Payer: Self-pay | Admitting: *Deleted

## 2016-08-08 DIAGNOSIS — R053 Chronic cough: Secondary | ICD-10-CM

## 2016-08-08 DIAGNOSIS — R05 Cough: Secondary | ICD-10-CM

## 2016-08-08 MED ORDER — PREDNISONE 5 MG PO TABS: ORAL_TABLET | ORAL | 0 refills | Status: DC

## 2016-08-08 MED ORDER — HYDROCOD POLST-CPM POLST ER 10-8 MG/5ML PO SUER: 5.0000 mL | Freq: Two times a day (BID) | ORAL | 0 refills | Status: DC

## 2016-08-11 ENCOUNTER — Observation Stay
Admit: 2016-08-11 | Discharge: 2016-08-11 | Disposition: A | Discharge status: Home / Self Care | Payer: BLUE CROSS/BLUE SHIELD | Attending: Internal Medicine | Admitting: Internal Medicine

## 2016-08-11 ENCOUNTER — Observation Stay
Admission: EM | Admit: 2016-08-11 | Discharge: 2016-08-13 | Disposition: A | Discharge status: Home / Self Care | Payer: BLUE CROSS/BLUE SHIELD | Attending: Internal Medicine | Admitting: Internal Medicine

## 2016-08-11 ENCOUNTER — Emergency Department: Payer: BLUE CROSS/BLUE SHIELD

## 2016-08-11 ENCOUNTER — Observation Stay: Payer: BLUE CROSS/BLUE SHIELD

## 2016-08-11 DIAGNOSIS — Z79899 Other long term (current) drug therapy: Secondary | ICD-10-CM | POA: Diagnosis not present

## 2016-08-11 DIAGNOSIS — E785 Hyperlipidemia, unspecified: Secondary | ICD-10-CM | POA: Diagnosis not present

## 2016-08-11 DIAGNOSIS — I252 Old myocardial infarction: Secondary | ICD-10-CM | POA: Diagnosis not present

## 2016-08-11 DIAGNOSIS — I951 Orthostatic hypotension: Secondary | ICD-10-CM | POA: Diagnosis not present

## 2016-08-11 DIAGNOSIS — R52 Pain, unspecified: Secondary | ICD-10-CM

## 2016-08-11 DIAGNOSIS — E7800 Pure hypercholesterolemia, unspecified: Secondary | ICD-10-CM | POA: Insufficient documentation

## 2016-08-11 DIAGNOSIS — E871 Hypo-osmolality and hyponatremia: Secondary | ICD-10-CM | POA: Diagnosis not present

## 2016-08-11 DIAGNOSIS — I10 Essential (primary) hypertension: Secondary | ICD-10-CM | POA: Insufficient documentation

## 2016-08-11 DIAGNOSIS — E860 Dehydration: Secondary | ICD-10-CM | POA: Insufficient documentation

## 2016-08-11 DIAGNOSIS — I2510 Atherosclerotic heart disease of native coronary artery without angina pectoris: Secondary | ICD-10-CM | POA: Insufficient documentation

## 2016-08-11 DIAGNOSIS — I82811 Embolism and thrombosis of superficial veins of right lower extremities: Secondary | ICD-10-CM | POA: Diagnosis not present

## 2016-08-11 DIAGNOSIS — Z7982 Long term (current) use of aspirin: Secondary | ICD-10-CM | POA: Diagnosis not present

## 2016-08-11 DIAGNOSIS — Z951 Presence of aortocoronary bypass graft: Secondary | ICD-10-CM | POA: Diagnosis not present

## 2016-08-11 DIAGNOSIS — R55 Syncope and collapse: Secondary | ICD-10-CM | POA: Diagnosis present

## 2016-08-11 HISTORY — DX: Atherosclerotic heart disease of native coronary artery without angina pectoris: I25.10

## 2016-08-11 LAB — COMPREHENSIVE METABOLIC PANEL
ALBUMIN: 3.7 g/dL (ref 3.5–5.0)
ALT: 69 U/L — ABNORMAL HIGH (ref 17–63)
AST: 46 U/L — AB (ref 15–41)
Alkaline Phosphatase: 78 U/L (ref 38–126)
Anion gap: 6 (ref 5–15)
BUN: 29 mg/dL — AB (ref 6–20)
CHLORIDE: 98 mmol/L — AB (ref 101–111)
CO2: 29 mmol/L (ref 22–32)
Calcium: 9.6 mg/dL (ref 8.9–10.3)
Creatinine, Ser: 1.17 mg/dL (ref 0.61–1.24)
GFR calc Af Amer: >60 mL/min (ref >60)
GLUCOSE: 115 mg/dL — AB (ref 65–99)
POTASSIUM: 3.9 mmol/L (ref 3.5–5.1)
Sodium: 133 mmol/L — ABNORMAL LOW (ref 135–145)
Total Bilirubin: 0.6 mg/dL (ref 0.3–1.2)
Total Protein: 8.6 g/dL — ABNORMAL HIGH (ref 6.5–8.1)

## 2016-08-11 LAB — CREATININE, SERUM
CREATININE: 1.06 mg/dL (ref 0.61–1.24)
GFR calc Af Amer: >60 mL/min (ref >60)
GFR calc non Af Amer: >60 mL/min (ref >60)

## 2016-08-11 LAB — CBC
HCT: 32.7 % — ABNORMAL LOW (ref 40.0–52.0)
HEMOGLOBIN: 11.3 g/dL — AB (ref 13.0–18.0)
MCH: 31.7 pg (ref 26.0–34.0)
MCHC: 34.6 g/dL (ref 32.0–36.0)
MCV: 91.6 fL (ref 80.0–100.0)
Platelets: 462 10*3/uL — ABNORMAL HIGH (ref 150–440)
RBC: 3.57 MIL/uL — AB (ref 4.40–5.90)
RDW: 12.8 % (ref 11.5–14.5)
WBC: 9.1 10*3/uL (ref 3.8–10.6)

## 2016-08-11 LAB — URINALYSIS COMPLETE WITH MICROSCOPIC (ARMC ONLY)
BACTERIA UA: NONE SEEN
Bilirubin Urine: NEGATIVE
Glucose, UA: NEGATIVE mg/dL
Hgb urine dipstick: NEGATIVE
Ketones, ur: NEGATIVE mg/dL
Leukocytes, UA: NEGATIVE
Nitrite: NEGATIVE
PH: 5 (ref 5.0–8.0)
PROTEIN: NEGATIVE mg/dL
Specific Gravity, Urine: 1.019 (ref 1.005–1.030)

## 2016-08-11 LAB — MAGNESIUM: Magnesium: 2 mg/dL (ref 1.7–2.4)

## 2016-08-11 LAB — TROPONIN I

## 2016-08-11 LAB — PROTIME-INR
INR: 1.18
Prothrombin Time: 15.1 seconds (ref 11.4–15.2)

## 2016-08-11 MED ORDER — ACETAMINOPHEN 650 MG RE SUPP: 650.0000 mg | Freq: Four times a day (QID) | RECTAL | Status: DC | PRN

## 2016-08-11 MED ORDER — ASPIRIN EC 325 MG PO TBEC
325.0000 mg | DELAYED_RELEASE_TABLET | Freq: Every day | ORAL | Status: DC
  Administered 2016-08-12 – 2016-08-13 (×2): 325 mg via ORAL
  Filled 2016-08-11 (×2): qty 1

## 2016-08-11 MED ORDER — ONDANSETRON HCL 4 MG/2ML IJ SOLN: 4.0000 mg | Freq: Four times a day (QID) | INTRAMUSCULAR | Status: DC | PRN

## 2016-08-11 MED ORDER — GUAIFENESIN ER 600 MG PO TB12: 600.0000 mg | ORAL_TABLET | Freq: Two times a day (BID) | ORAL | Status: DC | PRN

## 2016-08-11 MED ORDER — HYDROCOD POLST-CPM POLST ER 10-8 MG/5ML PO SUER
5.0000 mL | Freq: Two times a day (BID) | ORAL | Status: DC
  Administered 2016-08-11 – 2016-08-13 (×4): 5 mL via ORAL
  Filled 2016-08-11 (×4): qty 5

## 2016-08-11 MED ORDER — OXYCODONE HCL 5 MG PO TABS: 5.0000 mg | ORAL_TABLET | ORAL | Status: DC | PRN

## 2016-08-11 MED ORDER — HEPARIN SODIUM (PORCINE) 5000 UNIT/ML IJ SOLN
5000.0000 [IU] | Freq: Three times a day (TID) | INTRAMUSCULAR | Status: DC
  Administered 2016-08-11 – 2016-08-13 (×5): 5000 [IU] via SUBCUTANEOUS
  Filled 2016-08-11 (×5): qty 1

## 2016-08-11 MED ORDER — ACETAMINOPHEN 325 MG PO TABS
650.0000 mg | ORAL_TABLET | Freq: Four times a day (QID) | ORAL | Status: DC | PRN
  Administered 2016-08-12 – 2016-08-13 (×2): 650 mg via ORAL
  Filled 2016-08-11 (×2): qty 2

## 2016-08-11 MED ORDER — SODIUM CHLORIDE 0.9 % IV SOLN
INTRAVENOUS | Status: DC
  Administered 2016-08-11 – 2016-08-13 (×5): via INTRAVENOUS

## 2016-08-11 MED ORDER — ROSUVASTATIN CALCIUM 10 MG PO TABS
10.0000 mg | ORAL_TABLET | Freq: Every day | ORAL | Status: DC
  Administered 2016-08-12 – 2016-08-13 (×2): 10 mg via ORAL
  Filled 2016-08-11 (×2): qty 1

## 2016-08-11 MED ORDER — ONDANSETRON HCL 4 MG PO TABS: 4.0000 mg | ORAL_TABLET | Freq: Four times a day (QID) | ORAL | Status: DC | PRN

## 2016-08-11 MED ORDER — ALBUTEROL SULFATE (2.5 MG/3ML) 0.083% IN NEBU: 2.5000 mg | INHALATION_SOLUTION | RESPIRATORY_TRACT | Status: DC | PRN

## 2016-08-11 MED ORDER — FLUTICASONE PROPIONATE 50 MCG/ACT NA SUSP
1.0000 | Freq: Every evening | NASAL | Status: DC | PRN
  Filled 2016-08-11: qty 16

## 2016-08-11 NOTE — Progress Notes (Signed)
Nicholas Torres is a 55 y.o. 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   Review of Systems: No chest pain shortness of breath orthopnea PND or leg swelling   Past Medical History:  Diagnosis Date  . CAD (coronary artery disease)   . High cholesterol   . Hypertension   . Pollen allergy     Medications Prior to Admission  Medication Sig Dispense Refill  . aspirin EC 325 MG EC tablet Take 1 tablet (325 mg total) by mouth daily. 30 tablet 0  . chlorpheniramine-HYDROcodone (TUSSIONEX PENNKINETIC ER) 10-8 MG/5ML SUER Take 5 mLs by mouth 2 (two) times daily. 120 mL 0  . guaiFENesin (MUCINEX) 600 MG 12 hr tablet Take 1 tablet (600 mg total) by mouth 2 (two) times daily as needed for cough.    . isosorbide mononitrate (IMDUR) 30 MG 24 hr tablet Take 1 tablet (30 mg total) by mouth daily. 35 tablet 0  . metoprolol tartrate (LOPRESSOR) 25 MG tablet Take 1.5 tablets (37.5 mg total) by mouth 2 (two) times daily. 60 tablet 1  . oxyCODONE (OXY IR/ROXICODONE) 5 MG immediate release tablet Take 1-2 tablets (5-10 mg total) by mouth every 4 (four) hours as needed for severe pain. 30 tablet 0  . predniSONE (DELTASONE) 5 MG tablet Take 5 tabs today, 4 tabs Wed., 3 tabs Thurs., 2 tabs Fri., and 1 tab Sat. 15 tablet 0  . fluticasone (FLONASE) 50 MCG/ACT nasal spray Place 1 spray into both nostrils at bedtime as needed for rhinitis.    . rosuvastatin (CRESTOR) 10 MG tablet Take 10 mg by mouth daily.       Derrill Memo ON 08/12/2016] aspirin  325 mg Oral Daily  . chlorpheniramine-HYDROcodone  5 mL Oral BID  . heparin  5,000 Units Subcutaneous Q8H  . rosuvastatin  10 mg  Oral Daily    Infusions: . sodium chloride 100 mL/hr at 08/11/16 1545    Allergies  Allergen Reactions  . No Known Allergies     Social History   Social History  . Marital status: Married    Spouse name: N/A  . Number of children: N/A  . Years of education: N/A   Occupational History  . Not on file.   Social History Main Topics  . Smoking status: Never Smoker  . Smokeless tobacco: Never Used  . Alcohol use No  . Drug use: No  . Sexual activity: Yes   Other Topics Concern  . Not on file   Social History Narrative  . No narrative on file    Family History  Problem Relation Age of Onset  . Diabetes Mother   . Heart attack Father   . CAD Brother     PHYSICAL EXAM: Vitals:   08/11/16 1620 08/11/16 1622  BP: 116/74 95/61  Pulse: 83 95  Resp:    Temp:       Intake/Output Summary (Last 24 hours) at 08/11/16 1754 Last data filed at 08/11/16 1700  Gross per 24 hour  Intake              125 ml  Output  300 ml  Net             -175 ml    General:  Well appearing. No respiratory difficulty HEENT: normal Neck: supple. no JVD. Carotids 2+ bilat; no bruits. No lymphadenopathy or thryomegaly appreciated. Cor: PMI nondisplaced. Regular rate & rhythm. No rubs, gallops or murmurs. Lungs: clear Abdomen: soft, nontender, nondistended. No hepatosplenomegaly. No bruits or masses. Good bowel sounds. Extremities: no cyanosis, clubbing, rash, edema Neuro: alert & oriented x 3, cranial nerves grossly intact. moves all 4 extremities w/o difficulty. Affect pleasant.  ECG: Normal sinus rhythm e changes  Results for orders placed or performed during the hospital encounter of 08/11/16 (from the past 24 hour(s))  CBC     Status: Abnormal   Collection Time: 08/11/16 11:42 AM  Result Value Ref Range   WBC 9.1 3.8 - 10.6 K/uL   RBC 3.57 (L) 4.40 - 5.90 MIL/uL   Hemoglobin 11.3 (L) 13.0 - 18.0 g/dL   HCT 32.7 (L) 40.0 - 52.0 %   MCV 91.6 80.0 - 100.0 fL   MCH  31.7 26.0 - 34.0 pg   MCHC 34.6 32.0 - 36.0 g/dL   RDW 12.8 11.5 - 14.5 %   Platelets 462 (H) 150 - 440 K/uL  Comprehensive metabolic panel     Status: Abnormal   Collection Time: 08/11/16 11:42 AM  Result Value Ref Range   Sodium 133 (L) 135 - 145 mmol/L   Potassium 3.9 3.5 - 5.1 mmol/L   Chloride 98 (L) 101 - 111 mmol/L   CO2 29 22 - 32 mmol/L   Glucose, Bld 115 (H) 65 - 99 mg/dL   BUN 29 (H) 6 - 20 mg/dL   Creatinine, Ser 1.17 0.61 - 1.24 mg/dL   Calcium 9.6 8.9 - 10.3 mg/dL   Total Protein 8.6 (H) 6.5 - 8.1 g/dL   Albumin 3.7 3.5 - 5.0 g/dL   AST 46 (H) 15 - 41 U/L   ALT 69 (H) 17 - 63 U/L   Alkaline Phosphatase 78 38 - 126 U/L   Total Bilirubin 0.6 0.3 - 1.2 mg/dL   GFR calc non Af Amer >60 >60 mL/min   GFR calc Af Amer >60 >60 mL/min   Anion gap 6 5 - 15  Troponin I     Status: None   Collection Time: 08/11/16 11:42 AM  Result Value Ref Range   Troponin I <0.03 <0.03 ng/mL  Magnesium     Status: None   Collection Time: 08/11/16 11:42 AM  Result Value Ref Range   Magnesium 2.0 1.7 - 2.4 mg/dL  Protime-INR     Status: None   Collection Time: 08/11/16 11:42 AM  Result Value Ref Range   Prothrombin Time 15.1 11.4 - 15.2 seconds   INR 1.18   Urinalysis complete, with microscopic     Status: Abnormal   Collection Time: 08/11/16  3:39 PM  Result Value Ref Range   Color, Urine YELLOW (A) YELLOW   APPearance CLEAR (A) CLEAR   Glucose, UA NEGATIVE NEGATIVE mg/dL   Bilirubin Urine NEGATIVE NEGATIVE   Ketones, ur NEGATIVE NEGATIVE mg/dL   Specific Gravity, Urine 1.019 1.005 - 1.030   Hgb urine dipstick NEGATIVE NEGATIVE   pH 5.0 5.0 - 8.0   Protein, ur NEGATIVE NEGATIVE mg/dL   Nitrite NEGATIVE NEGATIVE   Leukocytes, UA NEGATIVE NEGATIVE   RBC / HPF 0-5 0 - 5 RBC/hpf   WBC, UA 0-5 0 - 5 WBC/hpf   Bacteria,  UA NONE SEEN NONE SEEN   Squamous Epithelial / LPF 0-5 (A) NONE SEEN   Mucous PRESENT   Creatinine, serum     Status: None   Collection Time: 08/11/16  4:23 PM   Result Value Ref Range   Creatinine, Ser 1.06 0.61 - 1.24 mg/dL   GFR calc non Af Amer >60 >60 mL/min   GFR calc Af Amer >60 >60 mL/min   Dg Chest 2 View  Result Date: 08/11/2016 CLINICAL DATA:  Post CABG.  Syncope. EXAM: CHEST  2 VIEW COMPARISON:  07/29/2016 chest radiograph. FINDINGS: Sternotomy wires appear aligned and intact. CABG clips overlie the mediastinum. Stable cardiomediastinal silhouette with normal heart size. No pneumothorax. No pleural effusion. Minimal scarring versus atelectasis at the lung bases. No pulmonary edema. No acute consolidative airspace disease. IMPRESSION: 1. No pneumothorax.  No pleural effusion.  No pulmonary edema. 2. Minimal bibasilar scarring versus atelectasis. Electronically Signed   By: Ilona Sorrel M.D.   On: 08/11/2016 13:10   Ct Head Wo Contrast  Result Date: 08/11/2016 CLINICAL DATA:  Seizure-like activity this morning. Acute syncopal episode. Concern for bleed. EXAM: CT HEAD WITHOUT CONTRAST TECHNIQUE: Contiguous axial images were obtained from the base of the skull through the vertex without intravenous contrast. COMPARISON:  None. FINDINGS: Brain: No evidence for acute hemorrhage, mass lesion, midline shift, hydrocephalus or large infarct. Two or three punctate hypodensities in the occipital areas are nonspecific. Vascular: No hyperdense vessel or unexpected calcification. Skull: Negative Sinuses/Orbits: Visualized sinuses are clear. Other: None. IMPRESSION: No acute intracranial abnormality. Electronically Signed   By: Markus Daft M.D.   On: 08/11/2016 12:40   US Carotid Bilateral  Result Date: 08/11/2016 CLINICAL DATA:  55 year old male with a history of syncope. Cardiovascular risk factors include hypertension, known coronary disease, hyperlipidemia EXAM: BILATERAL CAROTID DUPLEX ULTRASOUND TECHNIQUE: Pearline Cables scale imaging, color Doppler and duplex ultrasound were performed of bilateral carotid and vertebral arteries in the neck. COMPARISON:  No prior  duplex FINDINGS: Criteria: Quantification of carotid stenosis is based on velocity parameters that correlate the residual internal carotid diameter with NASCET-based stenosis levels, using the diameter of the distal internal carotid lumen as the denominator for stenosis measurement. The following velocity measurements were obtained: RIGHT ICA:  Systolic 81 cm/sec, Diastolic 37 cm/sec CCA:  54 cm/sec SYSTOLIC ICA/CCA RATIO:  1.5 ECA:  83 cm/sec LEFT ICA:  Systolic 57 cm/sec, Diastolic 28 cm/sec CCA:  65 cm/sec SYSTOLIC ICA/CCA RATIO:  0.9 ECA:  83 cm/sec Right Brachial SBP: Not acquired Left Brachial SBP: Not acquired RIGHT CAROTID ARTERY: No significant calcified disease of the right common carotid artery. Intermediate waveform maintained. Heterogeneous plaque with mild calcifications at the right carotid bifurcation. Low resistance waveform of the right ICA. No significant tortuosity. RIGHT VERTEBRAL ARTERY: Antegrade flow with low resistance waveform. LEFT CAROTID ARTERY: No significant calcified disease of the left common carotid artery. Intermediate waveform maintained. Heterogeneous plaque at the left carotid bifurcation without significant calcifications. Low resistance waveform of the left ICA. LEFT VERTEBRAL ARTERY:  Antegrade flow with low resistance waveform. IMPRESSION: Color duplex indicates minimal heterogeneous and calcified plaque, with no hemodynamically significant stenosis by duplex criteria in the extracranial cerebrovascular circulation. Signed, Dulcy Fanny. Earleen Newport, DO [facility] Electronically Signed   By: Corrie Mckusick D.O.   On: 08/11/2016 17:27     ASSESSMENT AND PLAN: Syncopal episode etiology is unclear want to rule out ventricular tachycardia. Carotid Dopplers were unremarkable and so far cardiac enzymes and  EKG is unremarkable. Also echocardiogram.  KHAN,SHAUKAT A

## 2016-08-11 NOTE — Progress Notes (Signed)
Pt. admitted to unit, [id number] from ED, report from [place], [place]. Oriented to room, call bell, Ascom phones and staff. Bed in low position. Fall safety plan reviewed, blue non-skid socks in place. Full assessment to Epic; skin assessed with Earlyne Iba., RN. Telemetry box verified with CCMD and [place], NT: [id number]. Will continue to monitor.

## 2016-08-11 NOTE — ED Triage Notes (Signed)
Pt came to ED via EMS. Pt had quadruple bypass on the 13th. At follow up appointment today had 2 near syncopal episodes. Pt denies falling, reports remember getting really hot. Denies pain.

## 2016-08-11 NOTE — ED Notes (Signed)
Patient given ED sandwich tray per hospitalist.

## 2016-08-11 NOTE — ED Notes (Signed)
Patient given urinal. Patient states he is unable to void at this time.

## 2016-08-11 NOTE — H&P (Addendum)
[place] [facility] [place] NAME: Nicholas Torres    MR#:  [id number]  DATE OF BIRTH:  May 22, 1961  DATE OF ADMISSION:  08/11/2016  PRIMARY CARE PHYSICIAN: Christie Nottingham., PA   REQUESTING/REFERRING PHYSICIAN: Merlyn Lot, MD  CHIEF COMPLAINT:   Chief Complaint  Patient presents with  . Near Syncope   2 episodes of syncope today. HISTORY OF PRESENT ILLNESS:  Nicholas Torres  is a 55 y.o. male with a known history of Hypertension, hyperlipidemia and CAD. Patient recently had an MI, was diagnosed with CAD with 3 vessel disease. He got CABG [facility] hospital. He follow-up his cardiologist Dr. Humphrey Rolls today and had 2 episodes of syncope in Dr. Laurelyn Sickle office, which lasted about 1 minutes with some faint. He denies any incontinence, slurred speech or dysphagia. Dr. Humphrey Rolls sent patient to the ED for further evaluation.  PAST MEDICAL HISTORY:   Past Medical History:  Diagnosis Date  . CAD (coronary artery disease)   . High cholesterol   . Hypertension   . Pollen allergy     PAST SURGICAL HISTORY:   Past Surgical History:  Procedure Laterality Date  . CARDIAC CATHETERIZATION Right 07/24/2016   Procedure: Left Heart Cath and Coronary Angiography;  Surgeon: Dionisio David, MD;  Location: [place] [facility];  Service: Cardiovascular;  Laterality: Right;  . CORONARY ARTERY BYPASS GRAFT N/A 07/26/2016   Procedure: CORONARY ARTERY BYPASS GRAFTING (CABG), ON PUMP, TIMES FOUR, USING BILATERAL INTERNAL MAMMARY ARTERIES, RIGHT GREATER SAPHENOUS VEIN HARVESTED ENDOSCOPICALLY;  Surgeon: Melrose Nakayama, MD;  Location: [place];  Service: Open Heart Surgery;  Laterality: N/A;  . RADIAL ARTERY HARVEST Left 07/26/2016   Procedure: RADIAL LEFT ARTERY HARVEST;  Surgeon: Melrose Nakayama, MD;  Location: [place];  Service: Open Heart Surgery;  Laterality: Left;  . TEE WITHOUT CARDIOVERSION N/A 07/26/2016   Procedure: TRANSESOPHAGEAL ECHOCARDIOGRAM (TEE);  Surgeon:  Melrose Nakayama, MD;  Location: [place];  Service: Open Heart Surgery;  Laterality: N/A;    SOCIAL HISTORY:   Social History  Substance Use Topics  . Smoking status: Never Smoker  . Smokeless tobacco: Never Used  . Alcohol use No    FAMILY HISTORY:   Family History  Problem Relation Age of Onset  . Diabetes Mother   . Heart attack Father   . CAD Brother     DRUG ALLERGIES:   Allergies  Allergen Reactions  . No Known Allergies     REVIEW OF SYSTEMS:   Review of Systems  Constitutional: Negative for chills, fever and malaise/fatigue.  HENT: Negative for congestion and sore throat.   Eyes: Negative for blurred vision and double vision.  Respiratory: Negative for cough, shortness of breath, wheezing and stridor.   Cardiovascular: Negative for chest pain, palpitations and leg swelling.  Gastrointestinal: Negative for abdominal pain, blood in stool, diarrhea, melena, nausea and vomiting.  Genitourinary: Negative for dysuria, frequency, hematuria and urgency.  Musculoskeletal: Negative for back pain and joint pain.  Skin: Negative for itching and rash.  Neurological: Positive for dizziness and loss of consciousness. Negative for tingling, focal weakness, seizures, weakness and headaches.  Psychiatric/Behavioral: Negative for depression. The patient is not nervous/anxious.     MEDICATIONS AT HOME:   Prior to Admission medications   Medication Sig Start Date End Date Taking? Authorizing Provider  aspirin EC 325 MG EC tablet Take 1 tablet (325 mg total) by mouth daily. 07/31/16  Yes Donielle Liston Alba, PA-C  chlorpheniramine-HYDROcodone (TUSSIONEX PENNKINETIC ER) 10-8 MG/5ML SUER Take 5 mLs by mouth 2 (two) times daily. 08/08/16  Yes Melrose Nakayama, MD  guaiFENesin (MUCINEX) 600 MG 12 hr tablet Take 1 tablet (600 mg total) by mouth 2 (two) times daily as needed for cough. 07/31/16  Yes Donielle Liston Alba, PA-C  isosorbide mononitrate (IMDUR) 30 MG 24 hr tablet Take  1 tablet (30 mg total) by mouth daily. 07/31/16  Yes Donielle Liston Alba, PA-C  metoprolol tartrate (LOPRESSOR) 25 MG tablet Take 1.5 tablets (37.5 mg total) by mouth 2 (two) times daily. 07/31/16  Yes Donielle Liston Alba, PA-C  oxyCODONE (OXY IR/ROXICODONE) 5 MG immediate release tablet Take 1-2 tablets (5-10 mg total) by mouth every 4 (four) hours as needed for severe pain. 07/31/16  Yes Donielle Liston Alba, PA-C  predniSONE (DELTASONE) 5 MG tablet Take 5 tabs today, 4 tabs Wed., 3 tabs Thurs., 2 tabs Fri., and 1 tab Sat. 08/08/16  Yes Melrose Nakayama, MD  fluticasone [facility]) 50 MCG/ACT nasal spray Place 1 spray into both nostrils at bedtime as needed for rhinitis. 06/01/16   Historical Provider, MD  rosuvastatin (CRESTOR) 10 MG tablet Take 10 mg by mouth daily.    Historical Provider, MD      VITAL SIGNS:  Blood pressure 113/81, pulse 84, temperature 97.5 F (36.4 C), temperature source Oral, resp. rate 18, height 5\' 5"  (1.651 m), weight 164 lb (74.4 kg), SpO2 100 %.  PHYSICAL EXAMINATION:  Physical Exam  GENERAL:  55 y.o.-year-old patient lying in the bed with no acute distress.  EYES: Pupils equal, round, reactive to light and accommodation. No scleral icterus. Extraocular muscles intact.  HEENT: Head atraumatic, normocephalic. Oropharynx and nasopharynx clear.  NECK:  Supple, no jugular venous distention. No thyroid enlargement, no tenderness.  LUNGS: Normal breath sounds bilaterally, no wheezing, rales,rhonchi or crepitation. No use of accessory muscles of respiration.  CARDIOVASCULAR: S1, S2 normal. No murmurs, rubs, or gallops.  ABDOMEN: Soft, nontender, nondistended. Bowel sounds present. No organomegaly or mass.  EXTREMITIES: No pedal edema, cyanosis, or clubbing.  NEUROLOGIC: Cranial nerves II through XII are intact. Muscle strength 5/5 in all extremities. Sensation intact. Gait not checked.  PSYCHIATRIC: The patient is alert and oriented x 3.  SKIN: No obvious rash,  lesion, or ulcer.   LABORATORY PANEL:   CBC  Recent Labs Lab 08/11/16 1142  WBC 9.1  HGB 11.3*  HCT 32.7*  PLT 462*   ------------------------------------------------------------------------------------------------------------------  Chemistries   Recent Labs Lab 08/11/16 1142  NA 133*  K 3.9  CL 98*  CO2 29  GLUCOSE 115*  BUN 29*  CREATININE 1.17  CALCIUM 9.6  MG 2.0  AST 46*  ALT 69*  ALKPHOS 78  BILITOT 0.6   ------------------------------------------------------------------------------------------------------------------  Cardiac Enzymes  Recent Labs Lab 08/11/16 1142  TROPONINI <0.03   ------------------------------------------------------------------------------------------------------------------  RADIOLOGY:  Dg Chest 2 View  Result Date: 08/11/2016 CLINICAL DATA:  Post CABG.  Syncope. EXAM: CHEST  2 VIEW COMPARISON:  07/29/2016 chest radiograph. FINDINGS: Sternotomy wires appear aligned and intact. CABG clips overlie the mediastinum. Stable cardiomediastinal silhouette with normal heart size. No pneumothorax. No pleural effusion. Minimal scarring versus atelectasis at the lung bases. No pulmonary edema. No acute consolidative airspace disease. IMPRESSION: 1. No pneumothorax.  No pleural effusion.  No pulmonary edema. 2. Minimal bibasilar scarring versus atelectasis. Electronically Signed   By: Ilona Sorrel M.D.   On: 08/11/2016 13:10   Ct Head Wo Contrast  Result Date: 08/11/2016 CLINICAL DATA:  Seizure-like activity this morning. Acute syncopal episode. Concern for bleed. EXAM: CT HEAD WITHOUT CONTRAST TECHNIQUE: Contiguous axial images were obtained from the base of the skull through the vertex without intravenous contrast. COMPARISON:  None. FINDINGS: Brain: No evidence for acute hemorrhage, mass lesion, midline shift, hydrocephalus or large infarct. Two or three punctate hypodensities in the occipital areas are nonspecific. Vascular: No hyperdense  vessel or unexpected calcification. Skull: Negative Sinuses/Orbits: Visualized sinuses are clear. Other: None. IMPRESSION: No acute intracranial abnormality. Electronically Signed   By: Markus Daft M.D.   On: 08/11/2016 12:40      IMPRESSION AND PLAN:   Syncope, possible due to an arrhythmia or dehydration The patient will be placed for observation. Continue telemetry monitor, check orthostatic vital signs and carotid duplex, get echocardiograph per Dr. Humphrey Rolls.  Dehydration and hyponatremia. Start normal saline IV and follow-up BMP.  Hypertension. Blood pressure is in low side. Hold Lopressor and Imdur, check orthostatic vital signs.  CAD, status post CABG. Continue aspirin and statin.   All the records are reviewed and case discussed with ED provider. Management plans discussed with the patient, His wife and they are in agreement.  CODE STATUS: Full code  TOTAL TIME TAKING CARE OF THIS PATIENT:  48 minutes.    Demetrios Loll M.D on 08/11/2016 at 4:05 PM  Between 7am to 6pm - Pager - [phone]  After 6pm go to [email or web address] - Proofreader  [facility]  Office  [phone]  CC: Primary care physician; Christie Nottingham., PA   Note: This dictation was prepared with Dragon dictation along with smaller phrase technology. Any transcriptional errors that result from this process are unintentional.

## 2016-08-11 NOTE — ED Provider Notes (Signed)
[facility] Emergency Department Provider Note    First MD Initiated Contact with Patient 08/11/16 1144     (approximate)  I have reviewed the triage vital signs and the nursing notes.   HISTORY  Chief Complaint Near Syncope    HPI Nicholas Torres is a 55 y.o. male who presents after 4 vessel bypass surgery on the 19th of this month. Patient was being evaluated in clinic today and had 2 witnessed syncopal episodes preceded by a generalized sensation of warmth and diaphoresis. Dr. Chancy Milroy was present in the clinic and witnessed one of these episodes lasting roughly 1 minute with some faint twitching. States that his vital signs are otherwise unremarkable. He was not on a monitor at that time but EKG immediately after the episode did not show any evidence of acute ischemia. Initial plan was for further outpatient workup as he returned to baseline by the patient subsequently had another episode lasting over 1 minute. At that point it was determined given his recent CABG to send patient to the ER for admission for further evaluation and management.  Patient denies any chest pain.  States he is otherwise been doing well from the postoperative standpoint. Moving his bowels, no chest pain, did have a cough with pain but was not out of the ordinary.   Past Medical History:  Diagnosis Date  . High cholesterol   . Hypertension   . Pollen allergy     Patient Active Problem List   Diagnosis Date Noted  . Syncope 08/11/2016  . CAD (coronary artery disease) 07/26/2016  . 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 CAD (coronary artery disease), native coronary artery 07/25/2016  . NSTEMI (non-ST elevated myocardial infarction) ([place]) 07/23/2016  . Family history of ischemic heart disease (IHD) 07/10/2013    Past Surgical History:  Procedure Laterality Date  . CARDIAC CATHETERIZATION Right 07/24/2016   Procedure: Left Heart Cath and Coronary Angiography;  Surgeon: Dionisio David, MD;   Location: [place] [facility];  Service: Cardiovascular;  Laterality: Right;  . CORONARY ARTERY BYPASS GRAFT N/A 07/26/2016   Procedure: CORONARY ARTERY BYPASS GRAFTING (CABG), ON PUMP, TIMES FOUR, USING BILATERAL INTERNAL MAMMARY ARTERIES, RIGHT GREATER SAPHENOUS VEIN HARVESTED ENDOSCOPICALLY;  Surgeon: Melrose Nakayama, MD;  Location: [place];  Service: Open Heart Surgery;  Laterality: N/A;  . RADIAL ARTERY HARVEST Left 07/26/2016   Procedure: RADIAL LEFT ARTERY HARVEST;  Surgeon: Melrose Nakayama, MD;  Location: [place];  Service: Open Heart Surgery;  Laterality: Left;  . TEE WITHOUT CARDIOVERSION N/A 07/26/2016   Procedure: TRANSESOPHAGEAL ECHOCARDIOGRAM (TEE);  Surgeon: Melrose Nakayama, MD;  Location: [place];  Service: Open Heart Surgery;  Laterality: N/A;    Prior to Admission medications   Medication Sig Start Date End Date Taking? Authorizing Provider  aspirin EC 325 MG EC tablet Take 1 tablet (325 mg total) by mouth daily. 07/31/16  Yes Donielle Liston Alba, PA-C  chlorpheniramine-HYDROcodone (TUSSIONEX PENNKINETIC ER) 10-8 MG/5ML SUER Take 5 mLs by mouth 2 (two) times daily. 08/08/16  Yes Melrose Nakayama, MD  guaiFENesin (MUCINEX) 600 MG 12 hr tablet Take 1 tablet (600 mg total) by mouth 2 (two) times daily as needed for cough. 07/31/16  Yes Donielle Liston Alba, PA-C  isosorbide mononitrate (IMDUR) 30 MG 24 hr tablet Take 1 tablet (30 mg total) by mouth daily. 07/31/16  Yes Donielle Liston Alba, PA-C  metoprolol tartrate (LOPRESSOR) 25 MG tablet Take 1.5 tablets (37.5 mg total) by mouth 2 (  two) times daily. 07/31/16  Yes Donielle Liston Alba, PA-C  oxyCODONE (OXY IR/ROXICODONE) 5 MG immediate release tablet Take 1-2 tablets (5-10 mg total) by mouth every 4 (four) hours as needed for severe pain. 07/31/16  Yes Donielle Liston Alba, PA-C  predniSONE (DELTASONE) 5 MG tablet Take 5 tabs today, 4 tabs Wed., 3 tabs Thurs., 2 tabs Fri., and 1 tab Sat. 08/08/16  Yes Melrose Nakayama, MD    fluticasone [facility]) 50 MCG/ACT nasal spray Place 1 spray into both nostrils at bedtime as needed for rhinitis. 06/01/16   Historical Provider, MD  rosuvastatin (CRESTOR) 10 MG tablet Take 10 mg by mouth daily.    Historical Provider, MD    Allergies No known allergies  Family History  Problem Relation Age of Onset  . Diabetes Mother   . Heart attack Father   . CAD Brother     Social History Social History  Substance Use Topics  . Smoking status: Never Smoker  . Smokeless tobacco: Never Used  . Alcohol use No    Review of Systems Patient denies headaches, rhinorrhea, blurry vision, numbness, shortness of breath, chest pain, edema, cough, abdominal pain, nausea, vomiting, diarrhea, dysuria, fevers, rashes or hallucinations unless otherwise stated above in HPI. ____________________________________________   PHYSICAL EXAM:  VITAL SIGNS: Vitals:   08/11/16 1303 08/11/16 1330  BP: 100/75 101/70  Pulse: 78 77  Resp:  (!) 21  Temp:      Constitutional: Alert and oriented. Ill appearing and in no acute distress Eyes: Conjunctivae are normal. PERRL. EOMI. Head: Atraumatic. Nose: No congestion/rhinnorhea. Mouth/Throat: Mucous membranes are moist.  Oropharynx non-erythematous. Neck: No stridor. Painless ROM. No cervical spine tenderness to palpation Hematological/Lymphatic/Immunilogical: No cervical lymphadenopathy. Cardiovascular: Normal rate, regular rhythm. Grossly normal heart sounds.  Good peripheral circulation. Midline sternotomy scar appears well healing Respiratory: Normal respiratory effort.  No retractions. Lungs CTAB. Gastrointestinal: Soft and nontender. No distention. No abdominal bruits. No CVA tenderness. Abdominal port wounds appear clean dry and intact Genitourinary:  Musculoskeletal: No lower extremity tenderness nor edema.  No joint effusions. Neurologic:  Normal speech and language. No gross focal neurologic deficits are appreciated. No gait  instability. Skin:  Skin is warm, dry and intact. No rash noted. Psychiatric: Mood and affect are normal. Speech and behavior are normal.  ____________________________________________   LABS (all labs ordered are listed, but only abnormal results are displayed)  Results for orders placed or performed during the hospital encounter of 08/11/16 (from the past 24 hour(s))  CBC     Status: Abnormal   Collection Time: 08/11/16 11:42 AM  Result Value Ref Range   WBC 9.1 3.8 - 10.6 K/uL   RBC 3.57 (L) 4.40 - 5.90 MIL/uL   Hemoglobin 11.3 (L) 13.0 - 18.0 g/dL   HCT 32.7 (L) 40.0 - 52.0 %   MCV 91.6 80.0 - 100.0 fL   MCH 31.7 26.0 - 34.0 pg   MCHC 34.6 32.0 - 36.0 g/dL   RDW 12.8 11.5 - 14.5 %   Platelets 462 (H) 150 - 440 K/uL  Comprehensive metabolic panel     Status: Abnormal   Collection Time: 08/11/16 11:42 AM  Result Value Ref Range   Sodium 133 (L) 135 - 145 mmol/L   Potassium 3.9 3.5 - 5.1 mmol/L   Chloride 98 (L) 101 - 111 mmol/L   CO2 29 22 - 32 mmol/L   Glucose, Bld 115 (H) 65 - 99 mg/dL   BUN 29 (H) 6 - 20 mg/dL  Creatinine, Ser 1.17 0.61 - 1.24 mg/dL   Calcium 9.6 8.9 - 10.3 mg/dL   Total Protein 8.6 (H) 6.5 - 8.1 g/dL   Albumin 3.7 3.5 - 5.0 g/dL   AST 46 (H) 15 - 41 U/L   ALT 69 (H) 17 - 63 U/L   Alkaline Phosphatase 78 38 - 126 U/L   Total Bilirubin 0.6 0.3 - 1.2 mg/dL   GFR calc non Af Amer >60 >60 mL/min   GFR calc Af Amer >60 >60 mL/min   Anion gap 6 5 - 15  Troponin I     Status: None   Collection Time: 08/11/16 11:42 AM  Result Value Ref Range   Troponin I <0.03 <0.03 ng/mL  Magnesium     Status: None   Collection Time: 08/11/16 11:42 AM  Result Value Ref Range   Magnesium 2.0 1.7 - 2.4 mg/dL  Protime-INR     Status: None   Collection Time: 08/11/16 11:42 AM  Result Value Ref Range   Prothrombin Time 15.1 11.4 - 15.2 seconds   INR 1.18    ____________________________________________  EKG My review and personal interpretation at Time: 6:43    Indication: syncope  Rate: 75  Rhythm: nsr Axis: normal Other: nonspecific T wave changes, no acute ST elevations or depressions ____________________________________________  RADIOLOGY  I personally reviewed all radiographic images ordered to evaluate for the above acute complaints and reviewed radiology reports and findings.  These findings were personally discussed with the patient.  Please see medical record for radiology report.  ____________________________________________   PROCEDURES  Procedure(s) performed: none    Critical Care performed: no ____________________________________________   INITIAL IMPRESSION / ASSESSMENT AND PLAN / ED COURSE  Pertinent labs & imaging results that were available during my care of the patient were reviewed by me and considered in my medical decision making (see chart for details).  DDX: ACS, pericarditis, esophagitis, boerhaaves, pe, dissection, pna, bronchitis, costochondritis   Nicholas Torres is a 55 y.o. who presents to the ED with 2 witnessed syncopal episodes at clinic status post CABG.   Patient arrives afebrile and hemodynamically stable. No focal neurologic deficits. No evidence of trauma. Initial EKG does not show evidence of dysrhythmia or acute ischemia. Given his recent CABG patient is at high risk for ventricular dysrhythmias causing his syncopal event. Will order CT head to evaluate for acute intracranial abnormality.  The patient will be placed on continuous pulse oximetry and telemetry for monitoring.  Laboratory evaluation will be sent to evaluate for the above complaints.     Clinical Course  Comment By Time  Troponin negative. Laboratory evaluation otherwise reassuring. Based on initial findings will admit to hospitalist for telemetry of further evaluation and management as well as evaluation by cardiology.  Have discussed with the patient and available family all diagnostics and treatments performed thus far and all  questions were answered to the best of my ability. The patient demonstrates understanding and agreement with plan.  Merlyn Lot, MD 09/29 1259     ____________________________________________   FINAL CLINICAL IMPRESSION(S) / ED DIAGNOSES  Final diagnoses:  Syncope and collapse  Hx of CABG      NEW MEDICATIONS STARTED DURING THIS VISIT:  New Prescriptions   No medications on file     Note:  This document was prepared using Dragon voice recognition software and may include unintentional dictation errors.    Merlyn Lot, MD 08/11/16 [id number]

## 2016-08-12 ENCOUNTER — Observation Stay: Payer: BLUE CROSS/BLUE SHIELD

## 2016-08-12 LAB — ECHOCARDIOGRAM COMPLETE
HEIGHTINCHES: 65 in
Weight: 2624 oz

## 2016-08-12 LAB — CBC
HEMATOCRIT: 33.6 % — AB (ref 40.0–52.0)
HEMOGLOBIN: 11.2 g/dL — AB (ref 13.0–18.0)
MCH: 30.7 pg (ref 26.0–34.0)
MCHC: 33.4 g/dL (ref 32.0–36.0)
MCV: 92 fL (ref 80.0–100.0)
Platelets: 432 10*3/uL (ref 150–440)
RBC: 3.66 MIL/uL — ABNORMAL LOW (ref 4.40–5.90)
RDW: 12.7 % (ref 11.5–14.5)
WBC: 8.6 10*3/uL (ref 3.8–10.6)

## 2016-08-12 NOTE — Progress Notes (Signed)
SUBJECTIVE: No chest pain no dizziness and no passing out spells   Vitals:   08/12/16 0020 08/12/16 0642 08/12/16 0644 08/12/16 0645  BP: (!) 91/47 102/71 97/74 (!) 87/59  Pulse: (!) 101 80 70   Resp:  15    Temp:  98.3 F (36.8 C)    TempSrc:  Oral    SpO2: 97% 99% 97%   Weight:      Height:        Intake/Output Summary (Last 24 hours) at 08/12/16 1036 Last data filed at 08/12/16 1023  Gross per 24 hour  Intake             1805 ml  Output              770 ml  Net             1035 ml    LABS: Basic Metabolic Panel:  Recent Labs  08/11/16 1142 08/11/16 1623  NA 133*  --   K 3.9  --   CL 98*  --   CO2 29  --   GLUCOSE 115*  --   BUN 29*  --   CREATININE 1.17 1.06  CALCIUM 9.6  --   MG 2.0  --    Liver Function Tests:  Recent Labs  08/11/16 1142  AST 46*  ALT 69*  ALKPHOS 78  BILITOT 0.6  PROT 8.6*  ALBUMIN 3.7   No results for input(s): LIPASE, AMYLASE in the last 72 hours. CBC:  Recent Labs  08/11/16 1142  WBC 9.1  HGB 11.3*  HCT 32.7*  MCV 91.6  PLT 462*   Cardiac Enzymes:  Recent Labs  08/11/16 1142  TROPONINI <0.03   BNP: Invalid input(s): POCBNP D-Dimer: No results for input(s): DDIMER in the last 72 hours. Hemoglobin A1C: No results for input(s): HGBA1C in the last 72 hours. Fasting Lipid Panel: No results for input(s): CHOL, HDL, LDLCALC, TRIG, CHOLHDL, LDLDIRECT in the last 72 hours. Thyroid Function Tests: No results for input(s): TSH, T4TOTAL, T3FREE, THYROIDAB in the last 72 hours.  Invalid input(s): FREET3 Anemia Panel: No results for input(s): VITAMINB12, FOLATE, FERRITIN, TIBC, IRON, RETICCTPCT in the last 72 hours.   PHYSICAL EXAM General: Well developed, well nourished, in no acute distress HEENT:  Normocephalic and atramatic Neck:  No JVD.  Lungs: Clear bilaterally to auscultation and percussion. Heart: HRRR . Normal S1 and S2 without gallops or murmurs.  Abdomen: Bowel sounds are positive, abdomen soft and  non-tender  Msk:  Back normal, normal gait. Normal strength and tone for age. Extremities: No clubbing, cyanosis or edema.   Neuro: Alert and oriented X 3. Psych:  Good affect, responds appropriately  TELEMETRY:Sinus rhythm  ASSESSMENT AND PLAN: Syncope etiology unclear with history of CABG 4 vessel and preserved left ventricular systolic function. I witnessed 2 episodes of syncope one 32nd another few minutes the second episode had twitching most likely is related to seizure disorder since the monitor didn't show any arrhythmias. Could be also due to dehydration. May get EEG to evaluate for seizure disorder.  Active Problems:   Syncope    Dionisio David, MD, [facility] 08/12/2016 10:36 AM

## 2016-08-12 NOTE — Progress Notes (Signed)
Patient ID: Nicholas Torres, male   DOB: November 11, 1961, 55 y.o.   MRN: [id number]  [employer] PROGRESS NOTE  Burman Madrazo [id number] DOB: [birth year] DOA: 08/11/2016 PCP: Christie Nottingham., PA  HPI/Subjective: Patient still orthostatic as per this morning. Having some right lower leg pain. Had 2 episodes of syncope in doctor's office the other day.  Objective: Vitals:   08/12/16 0645 08/12/16 1111  BP: (!) 87/59 98/67  Pulse:  85  Resp:  14  Temp:  98.3 F (36.8 C)    Filed Weights   08/11/16 1140 08/11/16 1533  Weight: 73.9 kg (163 lb) 74.4 kg (164 lb)    ROS: Review of Systems  Constitutional: Negative for chills and fever.  Eyes: Negative for blurred vision.  Respiratory: Negative for cough and shortness of breath.   Cardiovascular: Negative for chest pain.  Gastrointestinal: Negative for abdominal pain, constipation, diarrhea, nausea and vomiting.  Genitourinary: Negative for dysuria.  Musculoskeletal: Positive for joint pain.  Neurological: Negative for dizziness and headaches.   Exam: Physical Exam  Constitutional: He is oriented to person, place, and time.  HENT:  Nose: No mucosal edema.  Mouth/Throat: No oropharyngeal exudate or posterior oropharyngeal edema.  Eyes: Conjunctivae, EOM and lids are normal. Pupils are equal, round, and reactive to light.  Neck: No JVD present. Carotid bruit is not present. No edema present. No thyroid mass and no thyromegaly present.  Cardiovascular: S1 normal and S2 normal.  Exam reveals no gallop.   No murmur heard. Pulses:      Dorsalis pedis pulses are 2+ on the right side, and 2+ on the left side.  Respiratory: No respiratory distress. He has no wheezes. He has no rhonchi. He has no rales.  GI: Soft. Bowel sounds are normal. There is no tenderness.  Musculoskeletal:       Right ankle: He exhibits no swelling.       Left ankle: He exhibits no swelling.  Right lower extremity medial area with an area of tenderness to  palpation  Lymphadenopathy:    He has no cervical adenopathy.  Neurological: He is alert and oriented to person, place, and time. No cranial nerve deficit.  Skin: Skin is warm. No rash noted. Nails show no clubbing.  Psychiatric: He has a normal mood and affect.      Data Reviewed: Basic Metabolic Panel:  Recent Labs Lab 08/11/16 1142 08/11/16 1623  NA 133*  --   K 3.9  --   CL 98*  --   CO2 29  --   GLUCOSE 115*  --   BUN 29*  --   CREATININE 1.17 1.06  CALCIUM 9.6  --   MG 2.0  --    Liver Function Tests:  Recent Labs Lab 08/11/16 1142  AST 46*  ALT 69*  ALKPHOS 78  BILITOT 0.6  PROT 8.6*  ALBUMIN 3.7   CBC:  Recent Labs Lab 08/11/16 1142  WBC 9.1  HGB 11.3*  HCT 32.7*  MCV 91.6  PLT 462*      Studies: Dg Chest 2 View  Result Date: 08/11/2016 CLINICAL DATA:  Post CABG.  Syncope. EXAM: CHEST  2 VIEW COMPARISON:  07/29/2016 chest radiograph. FINDINGS: Sternotomy wires appear aligned and intact. CABG clips overlie the mediastinum. Stable cardiomediastinal silhouette with normal heart size. No pneumothorax. No pleural effusion. Minimal scarring versus atelectasis at the lung bases. No pulmonary edema. No acute consolidative airspace disease. IMPRESSION: 1. No pneumothorax.  No pleural effusion.  No pulmonary edema.  2. Minimal bibasilar scarring versus atelectasis. Electronically Signed   By: Ilona Sorrel M.D.   On: 08/11/2016 13:10   Ct Head Wo Contrast  Result Date: 08/11/2016 CLINICAL DATA:  Seizure-like activity this morning. Acute syncopal episode. Concern for bleed. EXAM: CT HEAD WITHOUT CONTRAST TECHNIQUE: Contiguous axial images were obtained from the base of the skull through the vertex without intravenous contrast. COMPARISON:  None. FINDINGS: Brain: No evidence for acute hemorrhage, mass lesion, midline shift, hydrocephalus or large infarct. Two or three punctate hypodensities in the occipital areas are nonspecific. Vascular: No hyperdense vessel or  unexpected calcification. Skull: Negative Sinuses/Orbits: Visualized sinuses are clear. Other: None. IMPRESSION: No acute intracranial abnormality. Electronically Signed   By: Markus Daft M.D.   On: 08/11/2016 12:40   US Carotid Bilateral  Result Date: 08/11/2016 CLINICAL DATA:  55 year old male with a history of syncope. Cardiovascular risk factors include hypertension, known coronary disease, hyperlipidemia EXAM: BILATERAL CAROTID DUPLEX ULTRASOUND TECHNIQUE: Pearline Cables scale imaging, color Doppler and duplex ultrasound were performed of bilateral carotid and vertebral arteries in the neck. COMPARISON:  No prior duplex FINDINGS: Criteria: Quantification of carotid stenosis is based on velocity parameters that correlate the residual internal carotid diameter with NASCET-based stenosis levels, using the diameter of the distal internal carotid lumen as the denominator for stenosis measurement. The following velocity measurements were obtained: RIGHT ICA:  Systolic 81 cm/sec, Diastolic 37 cm/sec CCA:  54 cm/sec SYSTOLIC ICA/CCA RATIO:  1.5 ECA:  83 cm/sec LEFT ICA:  Systolic 57 cm/sec, Diastolic 28 cm/sec CCA:  65 cm/sec SYSTOLIC ICA/CCA RATIO:  0.9 ECA:  83 cm/sec Right Brachial SBP: Not acquired Left Brachial SBP: Not acquired RIGHT CAROTID ARTERY: No significant calcified disease of the right common carotid artery. Intermediate waveform maintained. Heterogeneous plaque with mild calcifications at the right carotid bifurcation. Low resistance waveform of the right ICA. No significant tortuosity. RIGHT VERTEBRAL ARTERY: Antegrade flow with low resistance waveform. LEFT CAROTID ARTERY: No significant calcified disease of the left common carotid artery. Intermediate waveform maintained. Heterogeneous plaque at the left carotid bifurcation without significant calcifications. Low resistance waveform of the left ICA. LEFT VERTEBRAL ARTERY:  Antegrade flow with low resistance waveform. IMPRESSION: Color duplex indicates  minimal heterogeneous and calcified plaque, with no hemodynamically significant stenosis by duplex criteria in the extracranial cerebrovascular circulation. Signed, Dulcy Fanny. Earleen Newport, DO [facility] Radiology Electronically Signed   By: Corrie Mckusick D.O.   On: 08/11/2016 17:27   US Venous Img Lower Unilateral Right  Result Date: 08/12/2016 CLINICAL DATA:  Right lower extremity pain for 2 hours EXAM: RIGHT LOWER EXTREMITY VENOUS DUPLEX ULTRASOUND TECHNIQUE: Doppler venous assessment of the right lower extremity deep venous system was performed, including characterization of spectral flow, compressibility, and phasicity. COMPARISON:  None. FINDINGS: There is complete compressibility of the right common femoral, femoral, and popliteal veins. Doppler analysis demonstrates respiratory phasicity and augmentation of flow with calf compression. No obvious calf vein thrombosis. The greater saphenous vein is thrombosed from the saphenous femoral junction to the distal calf. IMPRESSION: No evidence of DVT. The study is positive for superficial vein thrombosis. Nearly the entire greater saphenous vein is thrombosed, beginning at the junction to the distal calf. Electronically Signed   By: Marybelle Killings M.D.   On: 08/12/2016 12:30    Scheduled Meds: . aspirin  325 mg Oral Daily  . chlorpheniramine-HYDROcodone  5 mL Oral BID  . heparin  5,000 Units Subcutaneous Q8H  . rosuvastatin  10 mg  Oral Daily   Continuous Infusions: . sodium chloride 100 mL/hr at 08/12/16 0057    Assessment/Plan:  1. Orthostatic hypotension and syncope. Hold metoprolol and imdur. Continue IV fluid hydration. 2. Hyperlipidemia unspecified on Crestor 3. History of coronary artery disease. Recent CABG. Continue aspirin only. 4. Right leg pain. Warm soaks for superficial thrombosis  Code Status:     Code Status Orders        Start     Ordered   08/11/16 1538  Full code  Continuous      08/11/16 1537    Code Status History    Date Active Date Inactive Code Status Order ID Comments User Context   08/11/2016  3:37 PM 08/11/2016  6:42 PM Full Code [id number]  Demetrios Loll, MD Inpatient   07/26/2016  2:57 PM 07/28/2016 10:27 AM Full Code [id number]  John Giovanni, PA-C Inpatient   07/23/2016 12:51 PM 07/24/2016 12:50 PM Full Code [id number]  Demetrios Loll, MD Inpatient     Family Communication: Wife at the bedside Disposition Plan: Disposition will be when he is not orthostatic  Consultants:  Cardiology  Time spent: 28 minutes  [place], [place]

## 2016-08-13 LAB — BASIC METABOLIC PANEL
ANION GAP: 6 (ref 5–15)
BUN: 16 mg/dL (ref 6–20)
CHLORIDE: 103 mmol/L (ref 101–111)
CO2: 24 mmol/L (ref 22–32)
Calcium: 9 mg/dL (ref 8.9–10.3)
Creatinine, Ser: 0.95 mg/dL (ref 0.61–1.24)
GFR calc non Af Amer: >60 mL/min (ref >60)
Glucose, Bld: 94 mg/dL (ref 65–99)
POTASSIUM: 4.2 mmol/L (ref 3.5–5.1)
SODIUM: 133 mmol/L — AB (ref 135–145)

## 2016-08-13 NOTE — Progress Notes (Signed)
Pt discharged  HL removed taken off tele discharge teaching completed with additional info on aspirin and syncope

## 2016-08-13 NOTE — Discharge Instructions (Signed)
When going from lying position to standing, must sit for a few minutes before walking Stay hydrated In follow up appointment with DR Humphrey Rolls, please check orthostatic vital signs.

## 2016-08-13 NOTE — Discharge Summary (Signed)
[facility] NAME: Nicholas Torres    MR#:  [id number]  DATE OF BIRTH:  01/28/1961  DATE OF ADMISSION:  08/11/2016 ADMITTING PHYSICIAN: Demetrios Loll, MD  DATE OF DISCHARGE: 08/13/2016  9:51 AM  PRIMARY CARE PHYSICIAN: Christie Nottingham., PA    ADMISSION DIAGNOSIS:  Syncope and collapse [R55] Hx of CABG [Z95.1]  DISCHARGE DIAGNOSIS:  Active Problems:   Syncope   SECONDARY DIAGNOSIS:   Past Medical History:  Diagnosis Date  . CAD (coronary artery disease)   . High cholesterol   . Hypertension   . Pollen allergy     HOSPITAL COURSE:   1. Orthostatic hypotension and syncope. Patient had 2 episodes of syncopal episode in the doctor's office. Yesterday afternoon he felt felt better and seen walking around the nursing station. All of his antihypertensive medications were held. Patient not orthostatic upon discharge home. At this point I will sent home off antihypertensive medications. Hold imdur and metoprolol. 2. Hyperlipidemia continue Crestor 3. History of coronary artery disease recent CABG. Continue aspirin only. Beta blocker held secondary to orthostatic hypotension. 3. Right leg pain. Patient has superficial thrombosis. No need for anticoagulation with this. Warm soaks.  DISCHARGE CONDITIONS:   Satisfactory  CONSULTS OBTAINED:   cardiology  DRUG ALLERGIES:   Allergies  Allergen Reactions  . No Known Allergies     DISCHARGE MEDICATIONS:   Discharge Medication List as of 08/13/2016  8:45 AM    CONTINUE these medications which have NOT CHANGED   Details  aspirin EC 325 MG EC tablet Take 1 tablet (325 mg total) by mouth daily., Starting Mon 07/31/2016, No Print    oxyCODONE (OXY IR/ROXICODONE) 5 MG immediate release tablet Take 1-2 tablets (5-10 mg total) by mouth every 4 (four) hours as needed for severe pain., Starting Mon 07/31/2016, Print    fluticasone (FLONASE) 50 MCG/ACT nasal spray Place 1 spray into both nostrils  at bedtime as needed for rhinitis., Starting Thu 06/01/2016, Historical Med    rosuvastatin (CRESTOR) 10 MG tablet Take 10 mg by mouth daily., Historical Med      STOP taking these medications     chlorpheniramine-HYDROcodone (TUSSIONEX PENNKINETIC ER) 10-8 MG/5ML SUER      guaiFENesin (MUCINEX) 600 MG 12 hr tablet      isosorbide mononitrate (IMDUR) 30 MG 24 hr tablet      metoprolol tartrate (LOPRESSOR) 25 MG tablet      predniSONE (DELTASONE) 5 MG tablet          DISCHARGE INSTRUCTIONS:   Follow-up Dr. Humphrey Rolls on Tuesday Follow-up PMD in 2 weeks  If you experience worsening of your admission symptoms, develop shortness of breath, life threatening emergency, suicidal or homicidal thoughts you must seek medical attention immediately by calling 911 or calling your MD immediately  if symptoms less severe.  You Must read complete instructions/literature along with all the possible adverse reactions/side effects for all the Medicines you take and that have been prescribed to you. Take any new Medicines after you have completely understood and accept all the possible adverse reactions/side effects.   Please note  You were cared for by a hospitalist during your hospital stay. If you have any questions about your discharge medications or the care you received while you were in the hospital after you are discharged, you can call the unit and asked to speak with the hospitalist on call if the hospitalist that took care of you is not available. Once  you are discharged, your primary care physician will handle any further medical issues. Please note that NO REFILLS for any discharge medications will be authorized once you are discharged, as it is imperative that you return to your primary care physician (or establish a relationship with a primary care physician if you do not have one) for your aftercare needs so that they can reassess your need for medications and monitor your lab  values.    Today   CHIEF COMPLAINT:   Chief Complaint  Patient presents with  . Near Syncope    HISTORY OF PRESENT ILLNESS:  Nicholas Torres  is a 55 y.o. male presented with 2 episodes of syncope   VITAL SIGNS:  Blood pressure 117/79, pulse 76, temperature 98.3 F (36.8 C), temperature source Oral, resp. rate 16, height 5\' 5"  (1.651 m), weight 74.4 kg (164 lb), SpO2 98 %.    PHYSICAL EXAMINATION:  GENERAL:  55 y.o.-year-old patient lying in the bed with no acute distress.  EYES: Pupils equal, round, reactive to light and accommodation. No scleral icterus. Extraocular muscles intact.  HEENT: Head atraumatic, normocephalic. Oropharynx and nasopharynx clear.  NECK:  Supple, no jugular venous distention. No thyroid enlargement, no tenderness.  LUNGS: Normal breath sounds bilaterally, no wheezing, rales,rhonchi or crepitation. No use of accessory muscles of respiration.  CARDIOVASCULAR: S1, S2 normal. No murmurs, rubs, or gallops.  ABDOMEN: Soft, non-tender, non-distended. Bowel sounds present. No organomegaly or mass.  EXTREMITIES: No pedal edema, cyanosis, or clubbing.  NEUROLOGIC: Cranial nerves II through XII are intact. Muscle strength 5/5 in all extremities. Sensation intact. Gait not checked.  PSYCHIATRIC: The patient is alert and oriented x 3.  SKIN: No obvious rash, lesion, or ulcer.   DATA REVIEW:   CBC  Recent Labs Lab 08/12/16 1454  WBC 8.6  HGB 11.2*  HCT 33.6*  PLT 432    Chemistries   Recent Labs Lab 08/11/16 1142  08/13/16 0411  NA 133*  --  133*  K 3.9  --  4.2  CL 98*  --  103  CO2 29  --  24  GLUCOSE 115*  --  94  BUN 29*  --  16  CREATININE 1.17  < > 0.95  CALCIUM 9.6  --  9.0  MG 2.0  --   --   AST 46*  --   --   ALT 69*  --   --   ALKPHOS 78  --   --   BILITOT 0.6  --   --   < > = values in this interval not displayed.  Cardiac Enzymes  Recent Labs Lab 08/11/16 1142  TROPONINI <0.03    Microbiology Results  Results for  orders placed or performed during the hospital encounter of 07/24/16  Surgical pcr screen     Status: None   Collection Time: 07/25/16  1:01 PM  Result Value Ref Range Status   MRSA, PCR NEGATIVE NEGATIVE Final   Staphylococcus aureus NEGATIVE NEGATIVE Final    Comment:        The Xpert SA Assay (FDA approved for NASAL specimens in patients over 94 years of age), is one component of a comprehensive surveillance program.  Test performance has been validated by [facility] for patients greater than or equal to 59 year old. It is not intended to diagnose infection nor to guide or monitor treatment.     RADIOLOGY:  US Carotid Bilateral  Result Date: 08/11/2016 CLINICAL DATA:  55 year old male with a  history of syncope. Cardiovascular risk factors include hypertension, known coronary disease, hyperlipidemia EXAM: BILATERAL CAROTID DUPLEX ULTRASOUND TECHNIQUE: Pearline Cables scale imaging, color Doppler and duplex ultrasound were performed of bilateral carotid and vertebral arteries in the neck. COMPARISON:  No prior duplex FINDINGS: Criteria: Quantification of carotid stenosis is based on velocity parameters that correlate the residual internal carotid diameter with NASCET-based stenosis levels, using the diameter of the distal internal carotid lumen as the denominator for stenosis measurement. The following velocity measurements were obtained: RIGHT ICA:  Systolic 81 cm/sec, Diastolic 37 cm/sec CCA:  54 cm/sec SYSTOLIC ICA/CCA RATIO:  1.5 ECA:  83 cm/sec LEFT ICA:  Systolic 57 cm/sec, Diastolic 28 cm/sec CCA:  65 cm/sec SYSTOLIC ICA/CCA RATIO:  0.9 ECA:  83 cm/sec Right Brachial SBP: Not acquired Left Brachial SBP: Not acquired RIGHT CAROTID ARTERY: No significant calcified disease of the right common carotid artery. Intermediate waveform maintained. Heterogeneous plaque with mild calcifications at the right carotid bifurcation. Low resistance waveform of the right ICA. No significant tortuosity. RIGHT  VERTEBRAL ARTERY: Antegrade flow with low resistance waveform. LEFT CAROTID ARTERY: No significant calcified disease of the left common carotid artery. Intermediate waveform maintained. Heterogeneous plaque at the left carotid bifurcation without significant calcifications. Low resistance waveform of the left ICA. LEFT VERTEBRAL ARTERY:  Antegrade flow with low resistance waveform. IMPRESSION: Color duplex indicates minimal heterogeneous and calcified plaque, with no hemodynamically significant stenosis by duplex criteria in the extracranial cerebrovascular circulation. Signed, Dulcy Fanny. Earleen Newport, DO [facility] Electronically Signed   By: Corrie Mckusick D.O.   On: 08/11/2016 17:27   US Venous Img Lower Unilateral Right  Result Date: 08/12/2016 CLINICAL DATA:  Right lower extremity pain for 2 hours EXAM: RIGHT LOWER EXTREMITY VENOUS DUPLEX ULTRASOUND TECHNIQUE: Doppler venous assessment of the right lower extremity deep venous system was performed, including characterization of spectral flow, compressibility, and phasicity. COMPARISON:  None. FINDINGS: There is complete compressibility of the right common femoral, femoral, and popliteal veins. Doppler analysis demonstrates respiratory phasicity and augmentation of flow with calf compression. No obvious calf vein thrombosis. The greater saphenous vein is thrombosed from the saphenous femoral junction to the distal calf. IMPRESSION: No evidence of DVT. The study is positive for superficial vein thrombosis. Nearly the entire greater saphenous vein is thrombosed, beginning at the junction to the distal calf. Electronically Signed   By: Marybelle Killings M.D.   On: 08/12/2016 12:30      Management plans discussed with the patient, family and they are in agreement.  CODE STATUS:  Code Status History    Date Active Date Inactive Code Status Order ID Comments User Context   08/11/2016  3:37 PM 08/11/2016  6:42 PM  Full Code [id number]  Demetrios Loll, MD Inpatient   07/26/2016  2:57 PM 07/28/2016 10:27 AM Full Code [id number]  John Giovanni, PA-C Inpatient   07/23/2016 12:51 PM 07/24/2016 12:50 PM Full Code [id number]  Demetrios Loll, MD Inpatient      TOTAL TIME TAKING CARE OF THIS PATIENT: 35 minutes.    Loletha Grayer M.D on 08/13/2016 at 3:03 PM  Between 7am to 6pm - Pager - [phone]  After 6pm go to [email or web address] - password [email or web address] [facility]  [phone]  CC: Primary care physician; Christie Nottingham., PA

## 2016-08-13 NOTE — Progress Notes (Signed)
Pt rested throughout the night. Pt complained of pain x2. Medicated.   Bud Face

## 2016-08-28 ENCOUNTER — Other Ambulatory Visit: Payer: Self-pay | Admitting: Thoracic Surgery (Cardiothoracic Vascular Surgery)

## 2016-08-28 DIAGNOSIS — Z951 Presence of aortocoronary bypass graft: Secondary | ICD-10-CM

## 2016-08-29 ENCOUNTER — Ambulatory Visit (INDEPENDENT_AMBULATORY_CARE_PROVIDER_SITE_OTHER): Payer: Self-pay | Admitting: Thoracic Surgery (Cardiothoracic Vascular Surgery)

## 2016-08-29 ENCOUNTER — Encounter: Payer: Self-pay | Admitting: Thoracic Surgery (Cardiothoracic Vascular Surgery)

## 2016-08-29 ENCOUNTER — Ambulatory Visit
Admission: RE | Admit: 2016-08-29 | Discharge: 2016-08-29 | Disposition: A | Discharge status: Home / Self Care | Payer: BLUE CROSS/BLUE SHIELD | Source: Ambulatory Visit | Attending: Thoracic Surgery (Cardiothoracic Vascular Surgery) | Admitting: Thoracic Surgery (Cardiothoracic Vascular Surgery)

## 2016-08-29 VITALS — BP 107/80 | HR 70 | Resp 20 | Ht 66.0 in | Wt 169.0 lb

## 2016-08-29 DIAGNOSIS — Z951 Presence of aortocoronary bypass graft: Secondary | ICD-10-CM

## 2016-08-29 NOTE — Progress Notes (Signed)
Nicholas Torres       [place] [address]             [phone]       HPI: Nicholas Torres returns today for scheduled follow-up visit.  He is a 55 year old man who underwent coronary bypass grafting 4 with bilateral mammary arteries and a left radial artery on 07/26/2016. His initial postoperative course was uncomplicated and he was discharged on postoperative day #5.  He had 2 presyncopal spells his doctor's office and was admitted over at [facility]. His blood pressure medications were stopped and he was hydrated. He has not had any further issues since then.  He has some incisional pain but is not taking any narcotics. He takes Tylenol occasionally. He has not had any recurrent angina.  Past Medical History:  Diagnosis Date  . CAD (coronary artery disease)   . High cholesterol   . Hypertension   . Pollen allergy       Current Outpatient Prescriptions  Medication Sig Dispense Refill  . aspirin EC 325 MG EC tablet Take 1 tablet (325 mg total) by mouth daily. 30 tablet 0  . fluticasone (FLONASE) 50 MCG/ACT nasal spray Place 1 spray into both nostrils at bedtime as needed for rhinitis.    . metoprolol tartrate (LOPRESSOR) 25 MG tablet 12.5 mg.     . rosuvastatin (CRESTOR) 10 MG tablet Take 10 mg by mouth daily.     No current facility-administered medications for this visit.     Physical Exam BP 107/80 (BP Location: Right Arm, Patient Position: Sitting, Cuff Size: Normal)   Pulse 70   Resp 20   Ht 5\' 6"  (1.676 m)   Wt 169 lb (76.7 kg)   SpO2 98% Comment: RA  BMI 27.39 kg/m  55 year old man in no acute distress Well-developed and well-nourished Alert and oriented 3 with no focal deficits Sternal incision clean dry and intact, sternum stable Cardiac regular rate and rhythm normal S1 and S2 Lungs clear with equal pulses bilaterally Left arm incision healing well, good capillary refill in am and index finger Leg incisions healing well, no  peripheral edema  Diagnostic Tests: CHEST  2 VIEW  COMPARISON:  08/11/2016  FINDINGS: Postop CABG. Heart size normal. Negative for heart failure or edema. Minimal pleural effusion on the lateral view, not appreciated on the frontal view. Lungs otherwise clear without infiltrate or effusion.  IMPRESSION: Minimal pleural effusion.  Otherwise lungs are clear.   Electronically Signed   By: Franchot Gallo M.D.   On: 08/29/2016 11:21 I personally reviewed the chest x-ray. This is a normal postoperative appearance.  Impression: Nicholas Torres is a 55 year old man who had coronary bypass grafting 4 on 07/26/2016. He is doing well at this point in time with only mild discomfort. He was readmitted to [facility] after having two presyncopal spells. That was likely a combination of some dehydration and blood pressure medication. He has not had any further problems with that since he was discharged in the hospital.  He has been started back on low-dose Toprol-XL 12.5 mg daily  He should not lift anything over 10 pounds for another 2 weeks and nothing over 20 pounds for another 4 weeks. He works for [employer]. 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 I think the earliest he could possibly go back with the mid December.  He is going to do cardiac rehabilitation at [facility].  Plan: He will continue to be  followed by Dr. Chancy Milroy.  I will be happy to see him back at any time the future if I can be of any further assistance with his care.  Melrose Nakayama, MD [facility] [phone]

## 2016-09-01 ENCOUNTER — Encounter: Payer: BLUE CROSS/BLUE SHIELD | Attending: Cardiovascular Disease | Admitting: *Deleted

## 2016-09-01 VITALS — Ht 65.5 in | Wt 167.0 lb

## 2016-09-01 DIAGNOSIS — Z9889 Other specified postprocedural states: Secondary | ICD-10-CM | POA: Insufficient documentation

## 2016-09-01 DIAGNOSIS — Z951 Presence of aortocoronary bypass graft: Secondary | ICD-10-CM | POA: Insufficient documentation

## 2016-09-01 DIAGNOSIS — I214 Non-ST elevation (NSTEMI) myocardial infarction: Secondary | ICD-10-CM

## 2016-09-01 DIAGNOSIS — I252 Old myocardial infarction: Secondary | ICD-10-CM | POA: Insufficient documentation

## 2016-09-01 DIAGNOSIS — Z48812 Encounter for surgical aftercare following surgery on the circulatory system: Secondary | ICD-10-CM | POA: Insufficient documentation

## 2016-09-01 NOTE — Progress Notes (Signed)
Daily Session Note  Patient Details  Name: Nicholas Torres MRN: [id number] Date of Birth: 1961-09-21 Referring Provider:    Encounter Date: 09/01/2016  Check In:     Session Check In - 09/01/16 1612      Check-In   Location [facility]   Staff Present Gerlene Burdock, RN, BSN;Jessica Luan Pulling, MA, ACSM RCEP, Exercise Physiologist;Amanda Oletta Darter, BA, ACSM CEP, Exercise Physiologist   Supervising physician immediately available to respond to emergencies See telemetry face sheet for immediately available ER MD   Fall or balance concerns reported    No   Warm-up and Cool-down Performed as group-led instruction   Resistance Training Performed Yes   VAD Patient? No     Pain Assessment   Currently in Pain? No/denies         Goals Met:  Proper associated with RPD/PD & O2 Sat Exercise tolerated well No report of cardiac concerns or symptoms  Goals Unmet:  Not Applicable  Comments:    Dr. Emily Filbert is Medical Director for [facility].

## 2016-09-01 NOTE — Progress Notes (Signed)
Cardiac Individual Treatment Plan  Patient Details  Name: Nicholas Torres MRN: [id number] Date of Birth: 10/31/1961 Referring Provider:   Neoma Laming, MD Initial Encounter Date: 09/01/2016  Visit Diagnosis: S/P CABG x 4  Patient's Home Medications on Admission:  Current Outpatient Prescriptions:  .  aspirin EC 325 MG EC tablet, Take 1 tablet (325 mg total) by mouth daily., Disp: 30 tablet, Rfl: 0 .  fluticasone (FLONASE) 50 MCG/ACT nasal spray, Place 1 spray into both nostrils at bedtime as needed for rhinitis., Disp: , Rfl:  .  metoprolol tartrate (LOPRESSOR) 25 MG tablet, 12.5 mg. , Disp: , Rfl:  .  rosuvastatin (CRESTOR) 10 MG tablet, Take 10 mg by mouth daily., Disp: , Rfl:   Past Medical History: Past Medical History:  Diagnosis Date  . CAD (coronary artery disease)   . High cholesterol   . Hypertension   . Pollen allergy     Tobacco Use: History  Smoking Status  . Never Smoker  Smokeless Tobacco  . Never Used    Labs: Recent Review Flowsheet Data    Labs for ITP Cardiac and Pulmonary Rehab Latest Ref Rng & Units 07/26/2016 07/26/2016 07/26/2016 07/27/2016 07/27/2016   Cholestrol 0 - 200 mg/dL - - - - -   LDLCALC 0 - 99 mg/dL - - - - -   HDL >40 mg/dL - - - - -   Trlycerides <150 mg/dL - - - - -   Hemoglobin A1c 4.8 - 5.6 % - - - - -   PHART 7.350 - 7.450 7.387 - 7.334(L) 7.330(L) -   PCO2ART 32.0 - 48.0 mmHg 36.8 - 40.7 42.3 -   HCO3 20.0 - 28.0 mmol/L 22.5 - 21.1 21.9 -   TCO2 0 - 100 mmol/L 24 23 22 23 25    ACIDBASEDEF 0.0 - 2.0 mmol/L 3.0(H) - 4.0(H) 3.0(H) -   O2SAT % 95.0 - 98.0 99.0 -       Exercise Target Goals:    Exercise Program Goal: Individual exercise prescription set with THRR, safety & activity barriers. Participant demonstrates ability to understand and report RPE using BORG scale, to self-measure pulse accurately, and to acknowledge the importance of the exercise prescription.  Exercise Prescription Goal: Starting with aerobic activity 30  plus minutes a day, 3 days per week for initial exercise prescription. Provide home exercise prescription and guidelines that participant acknowledges understanding prior to discharge.  Activity Barriers & Risk Stratification:     Activity Barriers & Cardiac Risk Stratification - 09/01/16 1639      Activity Barriers & Cardiac Risk Stratification   Activity Barriers None      6 Minute Walk:     6 Minute Walk    Row Name 09/01/16 1636         6 Minute Walk   Phase Initial     Walk Time 6 minutes     # of Rest Breaks 0     Perceived Dyspnea  0     Resting HR 101 bpm     Resting BP 128/80     Max Ex. HR 123 bpm     Max Ex. BP 118/74     2 Minute Post BP 104/78        Initial Exercise Prescription:   Perform Capillary Blood Glucose checks as needed.  Exercise Prescription Changes:   Exercise Comments:   Discharge Exercise Prescription (Final Exercise Prescription Changes):   Nutrition:  Target Goals: Understanding of nutrition guidelines, daily intake of sodium <  1500mg , cholesterol 200mg , calories 30% from fat and 7% or less from saturated fats, daily to have 5 or more servings of fruits and vegetables.  Biometrics:     Pre Biometrics - 09/01/16 1638      Pre Biometrics   Weight 167 lb (75.8 kg)       Nutrition Therapy Plan and Nutrition Goals:     Nutrition Therapy & Goals - 09/01/16 1631      Nutrition Therapy   Drug/Food Interactions Statins/Certain Fruits      Nutrition Discharge: Rate Your Plate Scores:   Nutrition Goals Re-Evaluation:   Psychosocial: Target Goals: Acknowledge presence or absence of depression, maximize coping skills, provide positive support system. Participant is able to verbalize types and ability to use techniques and skills needed for reducing stress and depression.  Initial Review & Psychosocial Screening:     Initial Psych Review & Screening - 09/01/16 1631      Initial Review   Current issues with Current  Stress Concerns     Family Dynamics   Good Support System? Yes   Comments Concerned out of work till Jan. Has 30 years in with [employer].     Barriers   Psychosocial barriers to participate in program The patient should benefit from training in stress management and relaxation.     Screening Interventions   Interventions Encouraged to exercise      Quality of Life Scores:   PHQ-9: Recent Review Flowsheet Data    Depression screen [facility] 2/9 09/01/2016   Decreased Interest 0   Down, Depressed, Hopeless 0   PHQ - 2 Score 0   Altered sleeping 2   Tired, decreased energy 2   Change in appetite 1   Feeling bad or failure about yourself  1   Trouble concentrating 0   Moving slowly or fidgety/restless 0   Suicidal thoughts 0   PHQ-9 Score 6   Difficult doing work/chores Not difficult at all      Psychosocial Evaluation and Intervention:   Psychosocial Re-Evaluation:   Vocational Rehabilitation: Provide vocational rehab assistance to qualifying candidates.   Vocational Rehab Evaluation & Intervention:     Vocational Rehab - 09/01/16 1612      Initial Vocational Rehab Evaluation & Intervention   Assessment shows need for Vocational Rehabilitation No      Education: Education Goals: Education classes will be provided on a weekly basis, covering required topics. Participant will state understanding/return demonstration of topics presented.  Learning Barriers/Preferences:     Learning Barriers/Preferences - 09/01/16 1639      Learning Barriers/Preferences   Learning Barriers None   Learning Preferences None      Education Topics: General Nutrition Guidelines/Fats and Fiber: -Group instruction provided by verbal, written material, models and posters to present the general guidelines for heart healthy nutrition. Gives an explanation and review of dietary fats and fiber.   Controlling Sodium/Reading Food Labels: -Group verbal and written material supporting the  discussion of sodium use in heart healthy nutrition. Review and explanation with models, verbal and written materials for utilization of the food label.   Exercise Physiology & Risk Factors: - Group verbal and written instruction with models to review the exercise physiology of the cardiovascular system and associated critical values. Details cardiovascular disease risk factors and the goals associated with each risk factor.   Aerobic Exercise & Resistance Training: - Gives group verbal and written discussion on the health impact of inactivity. On the components of aerobic and resistive training  programs and the benefits of this training and how to safely progress through these programs.   Flexibility, Balance, General Exercise Guidelines: - Provides group verbal and written instruction on the benefits of flexibility and balance training programs. Provides general exercise guidelines with specific guidelines to those with heart or lung disease. Demonstration and skill practice provided.   Stress Management: - Provides group verbal and written instruction about the health risks of elevated stress, cause of high stress, and healthy ways to reduce stress.   Depression: - Provides group verbal and written instruction on the correlation between heart/lung disease and depressed mood, treatment options, and the stigmas associated with seeking treatment.   Anatomy & Physiology of the Heart: - Group verbal and written instruction and models provide basic cardiac anatomy and physiology, with the coronary electrical and arterial systems. Review of: AMI, Angina, Valve disease, Heart Failure, Cardiac Arrhythmia, Pacemakers, and the ICD.   Cardiac Procedures: - Group verbal and written instruction and models to describe the testing methods done to diagnose heart disease. Reviews the outcomes of the test results. Describes the treatment choices: Medical Management, Angioplasty, or Coronary Bypass  Surgery.   Cardiac Medications: - Group verbal and written instruction to review commonly prescribed medications for heart disease. Reviews the medication, class of the drug, and side effects. Includes the steps to properly store meds and maintain the prescription regimen.   Go Sex-Intimacy & Heart Disease, Get SMART - Goal Setting: - Group verbal and written instruction through game format to discuss heart disease and the return to sexual intimacy. Provides group verbal and written material to discuss and apply goal setting through the application of the S.M.A.R.T. Method.   Other Matters of the Heart: - Provides group verbal, written materials and models to describe Heart Failure, Angina, Valve Disease, and Diabetes in the realm of heart disease. Includes description of the disease process and treatment options available to the cardiac patient.   Exercise & Equipment Safety: - Individual verbal instruction and demonstration of equipment use and safety with use of the equipment. Flowsheet Row Cardiac Rehab from 09/01/2016 in [facility] Cardiac and Pulmonary Rehab  Date  09/01/16  Educator  C. EnterkinRN  Instruction Review Code  2- meets goals/outcomes      Infection Prevention: - Provides verbal and written material to individual with discussion of infection control including proper hand washing and proper equipment cleaning during exercise session. Flowsheet Row Cardiac Rehab from 09/01/2016 in [facility] Cardiac and Pulmonary Rehab  Date  09/01/16  Educator  C. EnterkinRN  Instruction Review Code  2- meets goals/outcomes      Falls Prevention: - Provides verbal and written material to individual with discussion of falls prevention and safety. Flowsheet Row Cardiac Rehab from 09/01/2016 in [facility] Cardiac and Pulmonary Rehab  Date  09/01/16  Educator  C. [place]  Instruction Review Code  2- meets goals/outcomes      Diabetes: - Individual verbal and written instruction to review  signs/symptoms of diabetes, desired ranges of glucose level fasting, after meals and with exercise. Advice that pre and post exercise glucose checks will be done for 3 sessions at entry of program.    Knowledge Questionnaire Score:     Knowledge Questionnaire Score - 09/01/16 1633      Knowledge Questionnaire Score   Pre Score 21      Core Components/Risk Factors/Patient Goals at Admission:     Personal Goals and Risk Factors at Admission - 09/01/16 1630      Core  Components/Risk Factors/Patient Goals on Admission   Increase Strength and Stamina Yes   Intervention Provide advice, education, support and counseling about physical activity/exercise needs.;Develop an individualized exercise prescription for aerobic and resistive training based on initial evaluation findings, risk stratification, comorbidities and participant's personal goals.   Expected Outcomes Achievement of increased cardiorespiratory fitness and enhanced flexibility, muscular endurance and strength shown through measurements of functional capacity and personal statement of participant.   Hypertension Yes   Intervention Provide education on lifestyle modifcations including regular physical activity/exercise, weight management, moderate sodium restriction and increased consumption of fresh fruit, vegetables, and low fat dairy, alcohol moderation, and smoking cessation.;Monitor prescription use compliance.   Expected Outcomes Short Term: Continued assessment and intervention until BP is < 140/59mm HG in hypertensive participants. < 130/24mm HG in hypertensive participants with diabetes, heart failure or chronic kidney disease.;Long Term: Maintenance of blood pressure at goal levels.   Lipids Yes   Intervention Provide education and support for participant on nutrition & aerobic/resistive exercise along with prescribed medications to achieve LDL 70mg , HDL >40mg .   Expected Outcomes Short Term: Participant states  understanding of desired cholesterol values and is compliant with medications prescribed. Participant is following exercise prescription and nutrition guidelines.;Long Term: Cholesterol controlled with medications as prescribed, with individualized exercise RX and with personalized nutrition plan. Value goals: LDL < 70mg , HDL > 40 mg.   Stress Yes   Intervention Offer individual and/or small group education and counseling on adjustment to heart disease, stress management and health-related lifestyle change. Teach and support self-help strategies.;Refer participants experiencing significant psychosocial distress to appropriate mental health specialists for further evaluation and treatment. When possible, include family members and significant others in education/counseling sessions.   Expected Outcomes Short Term: Participant demonstrates changes in health-related behavior, relaxation and other stress management skills, ability to obtain effective social support, and compliance with psychotropic medications if prescribed.;Long Term: Emotional wellbeing is indicated by absence of clinically significant psychosocial distress or social isolation.  Concerned out of work till Jan. Has 30 years in with [employer].       Core Components/Risk Factors/Patient Goals Review:    Core Components/Risk Factors/Patient Goals at Discharge (Final Review):    ITP Comments:     ITP Comments    Row Name 09/01/16 1631           ITP Comments ITP started during Medical Review /Orientation appt after Cardiac Rehab informed consent signed.           Comments: Ready to start Cardiac Rehab.

## 2016-09-01 NOTE — Progress Notes (Addendum)
Cardiac Individual Treatment Plan  Patient Details  Name: Nicholas Torres MRN: [id number] Date of Birth: 03-Aug-1961 Referring Provider:   Flowsheet Row Cardiac Rehab from 09/01/2016 in [facility] Cardiac and Pulmonary Rehab  Referring Provider  Neoma Laming MD      Initial Encounter Date:  Flowsheet Row Cardiac Rehab from 09/01/2016 in [facility] Cardiac and Pulmonary Rehab  Date  09/02/16  Referring Provider  Neoma Laming MD      Visit Diagnosis: NSTEMI (non-ST elevated myocardial infarction) ([place])  S/P CABG x 4  Patient's Home Medications on Admission:  Current Outpatient Prescriptions:  .  aspirin EC 325 MG EC tablet, Take 1 tablet (325 mg total) by mouth daily., Disp: 30 tablet, Rfl: 0 .  fluticasone (FLONASE) 50 MCG/ACT nasal spray, Place 1 spray into both nostrils at bedtime as needed for rhinitis., Disp: , Rfl:  .  metoprolol tartrate (LOPRESSOR) 25 MG tablet, 12.5 mg. , Disp: , Rfl:  .  rosuvastatin (CRESTOR) 10 MG tablet, Take 10 mg by mouth daily., Disp: , Rfl:   Past Medical History: Past Medical History:  Diagnosis Date  . CAD (coronary artery disease)   . High cholesterol   . Hypertension   . Pollen allergy     Tobacco Use: History  Smoking Status  . Never Smoker  Smokeless Tobacco  . Never Used    Labs: Recent Review Flowsheet Data    Labs for ITP Cardiac and Pulmonary Rehab Latest Ref Rng & Units 07/26/2016 07/26/2016 07/26/2016 07/27/2016 07/27/2016   Cholestrol 0 - 200 mg/dL - - - - -   LDLCALC 0 - 99 mg/dL - - - - -   HDL >40 mg/dL - - - - -   Trlycerides <150 mg/dL - - - - -   Hemoglobin A1c 4.8 - 5.6 % - - - - -   PHART 7.350 - 7.450 7.387 - 7.334(L) 7.330(L) -   PCO2ART 32.0 - 48.0 mmHg 36.8 - 40.7 42.3 -   HCO3 20.0 - 28.0 mmol/L 22.5 - 21.1 21.9 -   TCO2 0 - 100 mmol/L 24 23 22 23 25    ACIDBASEDEF 0.0 - 2.0 mmol/L 3.0(H) - 4.0(H) 3.0(H) -   O2SAT % 95.0 - 98.0 99.0 -       Exercise Target Goals: Date: 09/02/16  Exercise Program  Goal: Individual exercise prescription set with THRR, safety & activity barriers. Participant demonstrates ability to understand and report RPE using BORG scale, to self-measure pulse accurately, and to acknowledge the importance of the exercise prescription.  Exercise Prescription Goal: Starting with aerobic activity 30 plus minutes a day, 3 days per week for initial exercise prescription. Provide home exercise prescription and guidelines that participant acknowledges understanding prior to discharge.  Activity Barriers & Risk Stratification:     Activity Barriers & Cardiac Risk Stratification - 09/01/16 1639      Activity Barriers & Cardiac Risk Stratification   Activity Barriers None      6 Minute Walk:     6 Minute Walk    Row Name 09/01/16 1636         6 Minute Walk   Phase Initial     Distance 1410 feet     Walk Time 6 minutes     # of Rest Breaks 0     MPH 2.67     METS 4.15     RPE 11     Perceived Dyspnea  0     VO2 Peak 14.54  Symptoms No     Resting HR 101 bpm     Resting BP 128/80     Max Ex. HR 123 bpm     Max Ex. BP 118/74     2 Minute Post BP 104/78        Initial Exercise Prescription:     Initial Exercise Prescription - 09/04/16 1300      Date of Initial Exercise RX and Referring Provider   Date 09/02/16   Referring Provider Neoma Laming MD     Treadmill   MPH 2.5   Grade 2   Minutes 15   METs 3.6     Recumbant Elliptical   Level 2   RPM 50   Minutes 15   METs 2     T5 Nustep   Level 3   Minutes 15   METs 2     Prescription Details   Frequency (times per week) 2   Duration Progress to 45 minutes of aerobic exercise without signs/symptoms of physical distress     Intensity   THRR 40-80% of Max Heartrate 127-154   Ratings of Perceived Exertion 11-15   Perceived Dyspnea 0-4     Progression   Progression Continue to progress workloads to maintain intensity without signs/symptoms of physical distress.     Resistance  Training   Training Prescription Yes   Weight 3 lbs   Reps 10-12      Perform Capillary Blood Glucose checks as needed.  Exercise Prescription Changes:      Exercise Prescription Changes    Row Name 09/01/16 1600             Exercise Review   Progression -  Walk test results         Response to Exercise   Blood Pressure (Admit) 128/80       Blood Pressure (Exercise) 118/74       Blood Pressure (Exit) 104/78       Heart Rate (Admit) 101 bpm       Heart Rate (Exercise) 123 bpm       Heart Rate (Exit) 94 bpm       Oxygen Saturation (Admit) 96 %       Oxygen Saturation (Exercise) 99 %       Rating of Perceived Exertion (Exercise) 11       Symptoms none          Exercise Comments:   Discharge Exercise Prescription (Final Exercise Prescription Changes):     Exercise Prescription Changes - 09/01/16 1600      Exercise Review   Progression --  Walk test results     Response to Exercise   Blood Pressure (Admit) 128/80   Blood Pressure (Exercise) 118/74   Blood Pressure (Exit) 104/78   Heart Rate (Admit) 101 bpm   Heart Rate (Exercise) 123 bpm   Heart Rate (Exit) 94 bpm   Oxygen Saturation (Admit) 96 %   Oxygen Saturation (Exercise) 99 %   Rating of Perceived Exertion (Exercise) 11   Symptoms none      Nutrition:  Target Goals: Understanding of nutrition guidelines, daily intake of sodium 1500mg , cholesterol 200mg , calories 30% from fat and 7% or less from saturated fats, daily to have 5 or more servings of fruits and vegetables.  Biometrics:     Pre Biometrics - 09/01/16 1638      Pre Biometrics   Height 5' 5.5" (1.664 m)   Weight 167 lb (75.8 kg)  Waist Circumference 35 inches   Hip Circumference 38.25 inches   Waist to Hip Ratio 0.92 %   BMI (Calculated) 27.4   Single Leg Stand 30 seconds       Nutrition Therapy Plan and Nutrition Goals:     Nutrition Therapy & Goals - 09/01/16 1631      Nutrition Therapy   Drug/Food Interactions  Statins/Certain Fruits      Nutrition Discharge: Rate Your Plate Scores:     Nutrition Assessments - 09/01/16 1644      Rate Your Plate Scores   Pre Score 69   Pre Score % 76 %      Nutrition Goals Re-Evaluation:   Psychosocial: Target Goals: Acknowledge presence or absence of depression, maximize coping skills, provide positive support system. Participant is able to verbalize types and ability to use techniques and skills needed for reducing stress and depression.  Initial Review & Psychosocial Screening:     Initial Psych Review & Screening - 09/01/16 1631      Initial Review   Current issues with Current Stress Concerns     Family Dynamics   Good Support System? Yes   Comments Concerned out of work till Jan. Has 30 years in with [employer].     Barriers   Psychosocial barriers to participate in program The patient should benefit from training in stress management and relaxation.     Screening Interventions   Interventions Encouraged to exercise      Quality of Life Scores:   PHQ-9: Recent Review Flowsheet Data    Depression screen [facility] 2/9 09/01/2016   Decreased Interest 0   Down, Depressed, Hopeless 0   PHQ - 2 Score 0   Altered sleeping 2   Tired, decreased energy 2   Change in appetite 1   Feeling bad or failure about yourself  1   Trouble concentrating 0   Moving slowly or fidgety/restless 0   Suicidal thoughts 0   PHQ-9 Score 6   Difficult doing work/chores Not difficult at all      Psychosocial Evaluation and Intervention:   Psychosocial Re-Evaluation:   Vocational Rehabilitation: Provide vocational rehab assistance to qualifying candidates.   Vocational Rehab Evaluation & Intervention:     Vocational Rehab - 09/01/16 1612      Initial Vocational Rehab Evaluation & Intervention   Assessment shows need for Vocational Rehabilitation No      Education: Education Goals: Education classes will be provided on a weekly basis, covering  required topics. Participant will state understanding/return demonstration of topics presented.  Learning Barriers/Preferences:     Learning Barriers/Preferences - 09/01/16 1639      Learning Barriers/Preferences   Learning Barriers None   Learning Preferences None      Education Topics: General Nutrition Guidelines/Fats and Fiber: -Group instruction provided by verbal, written material, models and posters to present the general guidelines for heart healthy nutrition. Gives an explanation and review of dietary fats and fiber.   Controlling Sodium/Reading Food Labels: -Group verbal and written material supporting the discussion of sodium use in heart healthy nutrition. Review and explanation with models, verbal and written materials for utilization of the food label.   Exercise Physiology & Risk Factors: - Group verbal and written instruction with models to review the exercise physiology of the cardiovascular system and associated critical values. Details cardiovascular disease risk factors and the goals associated with each risk factor.   Aerobic Exercise & Resistance Training: - Gives group verbal and written discussion on  the health impact of inactivity. On the components of aerobic and resistive training programs and the benefits of this training and how to safely progress through these programs.   Flexibility, Balance, General Exercise Guidelines: - Provides group verbal and written instruction on the benefits of flexibility and balance training programs. Provides general exercise guidelines with specific guidelines to those with heart or lung disease. Demonstration and skill practice provided.   Stress Management: - Provides group verbal and written instruction about the health risks of elevated stress, cause of high stress, and healthy ways to reduce stress.   Depression: - Provides group verbal and written instruction on the correlation between heart/lung disease and  depressed mood, treatment options, and the stigmas associated with seeking treatment.   Anatomy & Physiology of the Heart: - Group verbal and written instruction and models provide basic cardiac anatomy and physiology, with the coronary electrical and arterial systems. Review of: AMI, Angina, Valve disease, Heart Failure, Cardiac Arrhythmia, Pacemakers, and the ICD.   Cardiac Procedures: - Group verbal and written instruction and models to describe the testing methods done to diagnose heart disease. Reviews the outcomes of the test results. Describes the treatment choices: Medical Management, Angioplasty, or Coronary Bypass Surgery.   Cardiac Medications: - Group verbal and written instruction to review commonly prescribed medications for heart disease. Reviews the medication, class of the drug, and side effects. Includes the steps to properly store meds and maintain the prescription regimen.   Go Sex-Intimacy & Heart Disease, Get SMART - Goal Setting: - Group verbal and written instruction through game format to discuss heart disease and the return to sexual intimacy. Provides group verbal and written material to discuss and apply goal setting through the application of the S.M.A.R.T. Method.   Other Matters of the Heart: - Provides group verbal, written materials and models to describe Heart Failure, Angina, Valve Disease, and Diabetes in the realm of heart disease. Includes description of the disease process and treatment options available to the cardiac patient.   Exercise & Equipment Safety: - Individual verbal instruction and demonstration of equipment use and safety with use of the equipment. Flowsheet Row Cardiac Rehab from 09/01/2016 in [facility] Cardiac and Pulmonary Rehab  Date  09/01/16  Educator  C. EnterkinRN  Instruction Review Code  2- meets goals/outcomes      Infection Prevention: - Provides verbal and written material to individual with discussion of infection control  including proper hand washing and proper equipment cleaning during exercise session. Flowsheet Row Cardiac Rehab from 09/01/2016 in [facility] Cardiac and Pulmonary Rehab  Date  09/01/16  Educator  C. EnterkinRN  Instruction Review Code  2- meets goals/outcomes      Falls Prevention: - Provides verbal and written material to individual with discussion of falls prevention and safety. Flowsheet Row Cardiac Rehab from 09/01/2016 in [facility] Cardiac and Pulmonary Rehab  Date  09/01/16  Educator  C. [place]  Instruction Review Code  2- meets goals/outcomes      Diabetes: - Individual verbal and written instruction to review signs/symptoms of diabetes, desired ranges of glucose level fasting, after meals and with exercise. Advice that pre and post exercise glucose checks will be done for 3 sessions at entry of program.    Knowledge Questionnaire Score:     Knowledge Questionnaire Score - 09/01/16 1633      Knowledge Questionnaire Score   Pre Score 21/28      Core Components/Risk Factors/Patient Goals at Admission:     Personal Goals and  Risk Factors at Admission - 09/01/16 1630      Core Components/Risk Factors/Patient Goals on Admission    Weight Management Yes;Weight Maintenance   Intervention Weight Management: Develop a combined nutrition and exercise program designed to reach desired caloric intake, while maintaining appropriate intake of nutrient and fiber, sodium and fats, and appropriate energy expenditure required for the weight goal.;Weight Management: Provide education and appropriate resources to help participant work on and attain dietary goals.   Admit Weight 167 lb (75.8 kg)   Expected Outcomes Weight Maintenance: Understanding of the daily nutrition guidelines, which includes 25-35% calories from fat, 7% or less cal from saturated fats, less than 200mg  cholesterol, less than 1.5gm of sodium, & 5 or more servings of fruits and vegetables daily   Sedentary Yes    Intervention Provide advice, education, support and counseling about physical activity/exercise needs.;Develop an individualized exercise prescription for aerobic and resistive training based on initial evaluation findings, risk stratification, comorbidities and participant's personal goals.   Expected Outcomes Achievement of increased cardiorespiratory fitness and enhanced flexibility, muscular endurance and strength shown through measurements of functional capacity and personal statement of participant.   Increase Strength and Stamina Yes   Intervention Provide advice, education, support and counseling about physical activity/exercise needs.;Develop an individualized exercise prescription for aerobic and resistive training based on initial evaluation findings, risk stratification, comorbidities and participant's personal goals.   Expected Outcomes Achievement of increased cardiorespiratory fitness and enhanced flexibility, muscular endurance and strength shown through measurements of functional capacity and personal statement of participant.   Hypertension Yes   Intervention Provide education on lifestyle modifcations including regular physical activity/exercise, weight management, moderate sodium restriction and increased consumption of fresh fruit, vegetables, and low fat dairy, alcohol moderation, and smoking cessation.;Monitor prescription use compliance.   Expected Outcomes Short Term: Continued assessment and intervention until BP is < 140/60mm HG in hypertensive participants. < 130/20mm HG in hypertensive participants with diabetes, heart failure or chronic kidney disease.;Long Term: Maintenance of blood pressure at goal levels.   Lipids Yes   Intervention Provide education and support for participant on nutrition & aerobic/resistive exercise along with prescribed medications to achieve LDL 70mg , HDL >40mg .   Expected Outcomes Short Term: Participant states understanding of desired cholesterol  values and is compliant with medications prescribed. Participant is following exercise prescription and nutrition guidelines.;Long Term: Cholesterol controlled with medications as prescribed, with individualized exercise RX and with personalized nutrition plan. Value goals: LDL < 70mg , HDL > 40 mg.   Stress Yes   Intervention Offer individual and/or small group education and counseling on adjustment to heart disease, stress management and health-related lifestyle change. Teach and support self-help strategies.;Refer participants experiencing significant psychosocial distress to appropriate mental health specialists for further evaluation and treatment. When possible, include family members and significant others in education/counseling sessions.   Expected Outcomes Short Term: Participant demonstrates changes in health-related behavior, relaxation and other stress management skills, ability to obtain effective social support, and compliance with psychotropic medications if prescribed.;Long Term: Emotional wellbeing is indicated by absence of clinically significant psychosocial distress or social isolation.  Concerned out of work till Jan. Has 30 years in with [employer].       Core Components/Risk Factors/Patient Goals Review:    Core Components/Risk Factors/Patient Goals at Discharge (Final Review):    ITP Comments:     ITP Comments    Row Name 09/01/16 1631           ITP Comments ITP started during Medical Review /Orientation  appt after Cardiac Rehab informed consent signed.           Comments: Initial ITP

## 2016-09-01 NOTE — Patient Instructions (Addendum)
Patient Instructions  Patient Details  Name: Nicholas Torres MRN: [id number] Date of Birth: 1961-06-03 Referring Provider:  Dionisio David, MD  Below are the personal goals you chose as well as exercise and nutrition goals. Our goal is to help you keep on track towards obtaining and maintaining your goals. We will be discussing your progress on these goals with you throughout the program.  Initial Exercise Prescription:     Initial Exercise Prescription - 09/04/16 1300      Date of Initial Exercise RX and Referring Provider   Date 09/02/16   Referring Provider Neoma Laming MD     Treadmill   MPH 2.5   Grade 2   Minutes 15   METs 3.6     Recumbant Elliptical   Level 2   RPM 50   Minutes 15   METs 2     T5 Nustep   Level 3   Minutes 15   METs 2     Prescription Details   Frequency (times per week) 2   Duration Progress to 45 minutes of aerobic exercise without signs/symptoms of physical distress     Intensity   THRR 40-80% of Max Heartrate 127-154   Ratings of Perceived Exertion 11-15   Perceived Dyspnea 0-4     Progression   Progression Continue to progress workloads to maintain intensity without signs/symptoms of physical distress.     Resistance Training   Training Prescription Yes   Weight 3 lbs   Reps 10-12      Exercise Goals: Frequency: Be able to perform aerobic exercise three times per week working toward 3-5 days per week.  Intensity: Work with a perceived exertion of 11 (fairly light) - 15 (hard) as tolerated. Follow your new exercise prescription and watch for changes in prescription as you progress with the program. Changes will be reviewed with you when they are made.  Duration: You should be able to do 30 minutes of continuous aerobic exercise in addition to a 5 minute warm-up and a 5 minute cool-down routine.  Nutrition Goals: Your personal nutrition goals will be established when you do your nutrition analysis with the dietician.  The  following are nutrition guidelines to follow: Cholesterol < 200mg /day Sodium < 1500mg /day Fiber: Men over 50 yrs - 30 grams per day  Personal Goals:     Personal Goals and Risk Factors at Admission - 09/01/16 1630      Core Components/Risk Factors/Patient Goals on Admission    Weight Management Yes;Weight Maintenance   Intervention Weight Management: Develop a combined nutrition and exercise program designed to reach desired caloric intake, while maintaining appropriate intake of nutrient and fiber, sodium and fats, and appropriate energy expenditure required for the weight goal.;Weight Management: Provide education and appropriate resources to help participant work on and attain dietary goals.   Admit Weight 167 lb (75.8 kg)   Expected Outcomes Weight Maintenance: Understanding of the daily nutrition guidelines, which includes 25-35% calories from fat, 7% or less cal from saturated fats, less than 200mg  cholesterol, less than 1.5gm of sodium, & 5 or more servings of fruits and vegetables daily   Sedentary Yes   Intervention Provide advice, education, support and counseling about physical activity/exercise needs.;Develop an individualized exercise prescription for aerobic and resistive training based on initial evaluation findings, risk stratification, comorbidities and participant's personal goals.   Expected Outcomes Achievement of increased cardiorespiratory fitness and enhanced flexibility, muscular endurance and strength shown through measurements of functional capacity and personal statement of  participant.   Increase Strength and Stamina Yes   Intervention Provide advice, education, support and counseling about physical activity/exercise needs.;Develop an individualized exercise prescription for aerobic and resistive training based on initial evaluation findings, risk stratification, comorbidities and participant's personal goals.   Expected Outcomes Achievement of increased  cardiorespiratory fitness and enhanced flexibility, muscular endurance and strength shown through measurements of functional capacity and personal statement of participant.   Hypertension Yes   Intervention Provide education on lifestyle modifcations including regular physical activity/exercise, weight management, moderate sodium restriction and increased consumption of fresh fruit, vegetables, and low fat dairy, alcohol moderation, and smoking cessation.;Monitor prescription use compliance.   Expected Outcomes Short Term: Continued assessment and intervention until BP is < 140/44mm HG in hypertensive participants. < 130/76mm HG in hypertensive participants with diabetes, heart failure or chronic kidney disease.;Long Term: Maintenance of blood pressure at goal levels.   Lipids Yes   Intervention Provide education and support for participant on nutrition & aerobic/resistive exercise along with prescribed medications to achieve LDL 70mg , HDL >40mg .   Expected Outcomes Short Term: Participant states understanding of desired cholesterol values and is compliant with medications prescribed. Participant is following exercise prescription and nutrition guidelines.;Long Term: Cholesterol controlled with medications as prescribed, with individualized exercise RX and with personalized nutrition plan. Value goals: LDL < 70mg , HDL > 40 mg.   Stress Yes   Intervention Offer individual and/or small group education and counseling on adjustment to heart disease, stress management and health-related lifestyle change. Teach and support self-help strategies.;Refer participants experiencing significant psychosocial distress to appropriate mental health specialists for further evaluation and treatment. When possible, include family members and significant others in education/counseling sessions.   Expected Outcomes Short Term: Participant demonstrates changes in health-related behavior, relaxation and other stress management  skills, ability to obtain effective social support, and compliance with psychotropic medications if prescribed.;Long Term: Emotional wellbeing is indicated by absence of clinically significant psychosocial distress or social isolation.  Concerned out of work till Jan. Has 30 years in with [employer].       Tobacco Use Initial Evaluation: History  Smoking Status  . Never Smoker  Smokeless Tobacco  . Never Used    Copy of goals given to participant.

## 2016-09-07 ENCOUNTER — Encounter: Payer: BLUE CROSS/BLUE SHIELD | Admitting: *Deleted

## 2016-09-07 DIAGNOSIS — Z951 Presence of aortocoronary bypass graft: Secondary | ICD-10-CM

## 2016-09-07 DIAGNOSIS — I252 Old myocardial infarction: Secondary | ICD-10-CM | POA: Diagnosis not present

## 2016-09-07 DIAGNOSIS — Z9889 Other specified postprocedural states: Secondary | ICD-10-CM | POA: Diagnosis not present

## 2016-09-07 DIAGNOSIS — Z48812 Encounter for surgical aftercare following surgery on the circulatory system: Secondary | ICD-10-CM | POA: Diagnosis present

## 2016-09-07 NOTE — Progress Notes (Addendum)
Daily Session Note  Patient Details  Name: Nicholas Torres MRN: [id number] Date of Birth: Sep 18, 1961 Referring Provider:   Flowsheet Row Cardiac Rehab from 09/01/2016 in [facility] Cardiac and Pulmonary Rehab  Referring Provider  Neoma Laming MD      Encounter Date: 09/07/2016  Check In:     Session Check In - 09/07/16 0923      Check-In   Location [facility]-Cardiac & Pulmonary Rehab   Staff Present Gerlene Burdock, RN, BSN;Jessica Luan Pulling, MA, ACSM RCEP, Exercise Physiologist;Amanda Oletta Darter, BA, ACSM CEP, Exercise Physiologist   Supervising physician immediately available to respond to emergencies See telemetry face sheet for immediately available ER MD   Medication changes reported     No   Fall or balance concerns reported    No   Warm-up and Cool-down Performed on first and last piece of equipment   Resistance Training Performed Yes   VAD Patient? No     Pain Assessment   Currently in Pain? No/denies         Goals Met:  Proper associated with RPD/PD & O2 Sat Exercise tolerated well Personal goals reviewed No report of cardiac concerns or symptoms  Goals Unmet:  Not Applicable  Comments: First full day of exercise!  Patient was oriented to gym and equipment including functions, settings, policies, and procedures.  Patient's individual exercise prescription and treatment plan were reviewed.  All starting workloads were established based on the results of the 6 minute walk test done at initial orientation visit.  The plan for exercise progression was also introduced and progression will be customized based on patient's performance and goals.     Dr. Emily Filbert is Medical Director for [place] and [facility].

## 2016-09-12 ENCOUNTER — Encounter: Payer: BLUE CROSS/BLUE SHIELD | Admitting: *Deleted

## 2016-09-12 DIAGNOSIS — I214 Non-ST elevation (NSTEMI) myocardial infarction: Secondary | ICD-10-CM

## 2016-09-12 DIAGNOSIS — Z951 Presence of aortocoronary bypass graft: Secondary | ICD-10-CM

## 2016-09-12 DIAGNOSIS — Z48812 Encounter for surgical aftercare following surgery on the circulatory system: Secondary | ICD-10-CM | POA: Diagnosis not present

## 2016-09-12 NOTE — Progress Notes (Signed)
Daily Session Note  Patient Details  Name: Nicholas Torres MRN: [id number] Date of Birth: Oct 23, 1961 Referring Provider:   Flowsheet Row Cardiac Rehab from 09/01/2016 in [facility] Cardiac and Pulmonary Rehab  Referring Provider  Neoma Laming MD      Encounter Date: 09/12/2016  Check In:     Session Check In - 09/12/16 0815      Check-In   Location [facility]-Cardiac & Pulmonary Rehab   Staff Present Alberteen Sam, MA, ACSM RCEP, Exercise Physiologist;Susanne Bice, RN, BSN, CCRP;Laureen Owens Shark, BS, RRT, Respiratory Therapist;Other   Supervising physician immediately available to respond to emergencies See telemetry face sheet for immediately available ER MD   Medication changes reported     No   Fall or balance concerns reported    No   Warm-up and Cool-down Performed as group-led instruction   Resistance Training Performed Yes   VAD Patient? No     Pain Assessment   Currently in Pain? No/denies   Multiple Pain Sites No         Goals Met:  Independence with exercise equipment Exercise tolerated well No report of cardiac concerns or symptoms Strength training completed today  Goals Unmet:  Not Applicable  Comments: Pt able to follow exercise prescription today without complaint.  Will continue to monitor for progression.    Dr. Emily Filbert is Medical Director for [place] and [facility].

## 2016-09-14 ENCOUNTER — Encounter: Payer: BLUE CROSS/BLUE SHIELD | Attending: Cardiovascular Disease | Admitting: *Deleted

## 2016-09-14 DIAGNOSIS — I252 Old myocardial infarction: Secondary | ICD-10-CM | POA: Diagnosis not present

## 2016-09-14 DIAGNOSIS — I214 Non-ST elevation (NSTEMI) myocardial infarction: Secondary | ICD-10-CM

## 2016-09-14 DIAGNOSIS — Z951 Presence of aortocoronary bypass graft: Secondary | ICD-10-CM | POA: Diagnosis present

## 2016-09-14 DIAGNOSIS — Z9889 Other specified postprocedural states: Secondary | ICD-10-CM | POA: Insufficient documentation

## 2016-09-14 DIAGNOSIS — Z48812 Encounter for surgical aftercare following surgery on the circulatory system: Secondary | ICD-10-CM | POA: Diagnosis present

## 2016-09-14 NOTE — Progress Notes (Signed)
Daily Session Note  Patient Details  Name: Nicholas Torres MRN: [id number] Date of Birth: Jan 05, 1961 Referring Provider:   Flowsheet Row Cardiac Rehab from 09/01/2016 in [facility] Cardiac and Pulmonary Rehab  Referring Provider  Neoma Laming MD      Encounter Date: 09/14/2016  Check In:     Session Check In - 09/14/16 1007      Check-In   Location [facility]-Cardiac & Pulmonary Rehab   Staff Present Alberteen Sam, MA, ACSM RCEP, Exercise Physiologist;Susanne Bice, RN, BSN, Lance Sell, BA, ACSM CEP, Exercise Physiologist   Supervising physician immediately available to respond to emergencies See telemetry face sheet for immediately available ER MD   Medication changes reported     No   Fall or balance concerns reported    No   Warm-up and Cool-down Performed on first and last piece of equipment   Resistance Training Performed Yes   VAD Patient? No     Pain Assessment   Currently in Pain? No/denies   Multiple Pain Sites No         Goals Met:  Independence with exercise equipment Exercise tolerated well No report of cardiac concerns or symptoms Strength training completed today  Goals Unmet:  Not Applicable  Comments: Pt able to follow exercise prescription today without complaint.  Will continue to monitor for progression.    Dr. Emily Filbert is Medical Director for [place] and [facility].

## 2016-09-18 ENCOUNTER — Other Ambulatory Visit: Payer: Self-pay | Admitting: Physician Assistant

## 2016-09-19 ENCOUNTER — Encounter: Payer: BLUE CROSS/BLUE SHIELD | Admitting: *Deleted

## 2016-09-19 DIAGNOSIS — Z951 Presence of aortocoronary bypass graft: Secondary | ICD-10-CM

## 2016-09-19 DIAGNOSIS — Z736 Limitation of activities due to disability: Secondary | ICD-10-CM

## 2016-09-19 DIAGNOSIS — Z48812 Encounter for surgical aftercare following surgery on the circulatory system: Secondary | ICD-10-CM | POA: Diagnosis not present

## 2016-09-19 DIAGNOSIS — I214 Non-ST elevation (NSTEMI) myocardial infarction: Secondary | ICD-10-CM

## 2016-09-19 NOTE — Progress Notes (Signed)
Daily Session Note  Patient Details  Name: Nicholas Torres MRN: [id number] Date of Birth: 06-04-1961 Referring Provider:   Flowsheet Row Cardiac Rehab from 09/01/2016 in [facility] Cardiac and Pulmonary Rehab  Referring Provider  Neoma Laming MD      Encounter Date: 09/19/2016  Check In:     Session Check In - 09/19/16 0821      Check-In   Location [facility]-Cardiac & Pulmonary Rehab   Staff Present Alberteen Sam, MA, ACSM RCEP, Exercise Physiologist;Susanne Bice, RN, BSN, CCRP;Laureen Owens Shark, BS, RRT, Respiratory Therapist   Supervising physician immediately available to respond to emergencies See telemetry face sheet for immediately available ER MD   Medication changes reported     No   Fall or balance concerns reported    No   Warm-up and Cool-down Performed on first and last piece of equipment   Resistance Training Performed Yes   VAD Patient? No     Pain Assessment   Currently in Pain? No/denies   Multiple Pain Sites No         Goals Met:  Independence with exercise equipment Exercise tolerated well No report of cardiac concerns or symptoms Strength training completed today  Goals Unmet:  Not Applicable  Comments: Pt able to follow exercise prescription today without complaint.  Will continue to monitor for progression.    Dr. Emily Filbert is Medical Director for [place] and [facility].

## 2016-09-20 ENCOUNTER — Encounter: Payer: Self-pay | Admitting: *Deleted

## 2016-09-20 DIAGNOSIS — I214 Non-ST elevation (NSTEMI) myocardial infarction: Secondary | ICD-10-CM

## 2016-09-20 DIAGNOSIS — Z951 Presence of aortocoronary bypass graft: Secondary | ICD-10-CM

## 2016-09-20 NOTE — Progress Notes (Signed)
Cardiac Individual Treatment Plan  Patient Details  Name: Nicholas Torres MRN: [id number] Date of Birth: 1961-06-19 Referring Provider:   Flowsheet Row Cardiac Rehab from 09/01/2016 in [facility] Cardiac and Pulmonary Rehab  Referring Provider  Neoma Laming MD      Initial Encounter Date:  Flowsheet Row Cardiac Rehab from 09/01/2016 in [facility] Cardiac and Pulmonary Rehab  Date  09/02/16  Referring Provider  Neoma Laming MD      Visit Diagnosis: NSTEMI (non-ST elevated myocardial infarction) ([place])  S/P CABG x 4  Patient's Home Medications on Admission:  Current Outpatient Prescriptions:  .  aspirin EC 325 MG EC tablet, Take 1 tablet (325 mg total) by mouth daily., Disp: 30 tablet, Rfl: 0 .  fluticasone (FLONASE) 50 MCG/ACT nasal spray, Place 1 spray into both nostrils at bedtime as needed for rhinitis., Disp: , Rfl:  .  metoprolol tartrate (LOPRESSOR) 25 MG tablet, 12.5 mg. , Disp: , Rfl:  .  rosuvastatin (CRESTOR) 10 MG tablet, Take 10 mg by mouth daily., Disp: , Rfl:   Past Medical History: Past Medical History:  Diagnosis Date  . CAD (coronary artery disease)   . High cholesterol   . Hypertension   . Pollen allergy     Tobacco Use: History  Smoking Status  . Never Smoker  Smokeless Tobacco  . Never Used    Labs: Recent Review Flowsheet Data    Labs for ITP Cardiac and Pulmonary Rehab Latest Ref Rng & Units 07/26/2016 07/26/2016 07/26/2016 07/27/2016 07/27/2016   Cholestrol 0 - 200 mg/dL - - - - -   LDLCALC 0 - 99 mg/dL - - - - -   HDL >40 mg/dL - - - - -   Trlycerides <150 mg/dL - - - - -   Hemoglobin A1c 4.8 - 5.6 % - - - - -   PHART 7.350 - 7.450 7.387 - 7.334(L) 7.330(L) -   PCO2ART 32.0 - 48.0 mmHg 36.8 - 40.7 42.3 -   HCO3 20.0 - 28.0 mmol/L 22.5 - 21.1 21.9 -   TCO2 0 - 100 mmol/L 24 23 22 23 25    ACIDBASEDEF 0.0 - 2.0 mmol/L 3.0(H) - 4.0(H) 3.0(H) -   O2SAT % 95.0 - 98.0 99.0 -       Exercise Target Goals:    Exercise Program Goal: Individual exercise  prescription set with THRR, safety & activity barriers. Participant demonstrates ability to understand and report RPE using BORG scale, to self-measure pulse accurately, and to acknowledge the importance of the exercise prescription.  Exercise Prescription Goal: Starting with aerobic activity 30 plus minutes a day, 3 days per week for initial exercise prescription. Provide home exercise prescription and guidelines that participant acknowledges understanding prior to discharge.  Activity Barriers & Risk Stratification:     Activity Barriers & Cardiac Risk Stratification - 09/01/16 1639      Activity Barriers & Cardiac Risk Stratification   Activity Barriers None      6 Minute Walk:     6 Minute Walk    Row Name 09/01/16 1636         6 Minute Walk   Phase Initial     Distance 1410 feet     Walk Time 6 minutes     # of Rest Breaks 0     MPH 2.67     METS 4.15     RPE 11     Perceived Dyspnea  0     VO2 Peak 14.54  Symptoms No     Resting HR 101 bpm     Resting BP 128/80     Max Ex. HR 123 bpm     Max Ex. BP 118/74     2 Minute Post BP 104/78        Initial Exercise Prescription:     Initial Exercise Prescription - 09/04/16 1300      Date of Initial Exercise RX and Referring Provider   Date 09/02/16   Referring Provider Neoma Laming MD     Treadmill   MPH 2.5   Grade 2   Minutes 15   METs 3.6     Recumbant Elliptical   Level 2   RPM 50   Minutes 15   METs 2     T5 Nustep   Level 3   Minutes 15   METs 2     Prescription Details   Frequency (times per week) 2   Duration Progress to 45 minutes of aerobic exercise without signs/symptoms of physical distress     Intensity   THRR 40-80% of Max Heartrate 127-154   Ratings of Perceived Exertion 11-15   Perceived Dyspnea 0-4     Progression   Progression Continue to progress workloads to maintain intensity without signs/symptoms of physical distress.     Resistance Training   Training  Prescription Yes   Weight 3 lbs   Reps 10-12      Perform Capillary Blood Glucose checks as needed.  Exercise Prescription Changes:     Exercise Prescription Changes    Row Name 09/01/16 1600 09/13/16 0900           Exercise Review   Progression -  Walk test results Yes        Response to Exercise   Blood Pressure (Admit) 128/80 132/70      Blood Pressure (Exercise) 118/74 134/72      Blood Pressure (Exit) 104/78 124/78      Heart Rate (Admit) 101 bpm 61 bpm      Heart Rate (Exercise) 123 bpm 132 bpm      Heart Rate (Exit) 94 bpm 82 bpm      Oxygen Saturation (Admit) 96 %  -      Oxygen Saturation (Exercise) 99 %  -      Rating of Perceived Exertion (Exercise) 11 12      Symptoms none none      Duration  - Progress to 45 minutes of aerobic exercise without signs/symptoms of physical distress      Intensity  - THRR unchanged        Progression   Progression  - Continue to progress workloads to maintain intensity without signs/symptoms of physical distress.      Average METs  - 2.84        Resistance Training   Training Prescription  - Yes      Weight  - 5 lbs      Reps  - 10-12        Interval Training   Interval Training  - No        Treadmill   MPH  - 3      Grade  - 2      Minutes  - 15      METs  - 4.12        Recumbant Elliptical   Level  - 2      RPM  - 50      Minutes  -  15      METs  - 2.1        T5 Nustep   Level  - 3      Minutes  - 15      METs  - 2.3         Exercise Comments:     Exercise Comments    Row Name 09/07/16 0940 09/13/16 0935         Exercise Comments First full day of exercise!  Patient was oriented to gym and equipment including functions, settings, policies, and procedures.  Patient's individual exercise prescription and treatment plan were reviewed.  All starting workloads were established based on the results of the 6 minute walk test done at initial orientation visit.  The plan for exercise progression was also  introduced and progression will be customized based on patient's performance and goals. Antavius is off to a great start with exercise.  We will continue to work with him on progression.         Discharge Exercise Prescription (Final Exercise Prescription Changes):     Exercise Prescription Changes - 09/13/16 0900      Exercise Review   Progression Yes     Response to Exercise   Blood Pressure (Admit) 132/70   Blood Pressure (Exercise) 134/72   Blood Pressure (Exit) 124/78   Heart Rate (Admit) 61 bpm   Heart Rate (Exercise) 132 bpm   Heart Rate (Exit) 82 bpm   Rating of Perceived Exertion (Exercise) 12   Symptoms none   Duration Progress to 45 minutes of aerobic exercise without signs/symptoms of physical distress   Intensity THRR unchanged     Progression   Progression Continue to progress workloads to maintain intensity without signs/symptoms of physical distress.   Average METs 2.84     Resistance Training   Training Prescription Yes   Weight 5 lbs   Reps 10-12     Interval Training   Interval Training No     Treadmill   MPH 3   Grade 2   Minutes 15   METs 4.12     Recumbant Elliptical   Level 2   RPM 50   Minutes 15   METs 2.1     T5 Nustep   Level 3   Minutes 15   METs 2.3      Nutrition:  Target Goals: Understanding of nutrition guidelines, daily intake of sodium 1500mg , cholesterol 200mg , calories 30% from fat and 7% or less from saturated fats, daily to have 5 or more servings of fruits and vegetables.  Biometrics:     Pre Biometrics - 09/01/16 1638      Pre Biometrics   Height 5' 5.5" (1.664 m)   Weight 167 lb (75.8 kg)   Waist Circumference 35 inches   Hip Circumference 38.25 inches   Waist to Hip Ratio 0.92 %   BMI (Calculated) 27.4   Single Leg Stand 30 seconds       Nutrition Therapy Plan and Nutrition Goals:     Nutrition Therapy & Goals - 09/19/16 1443      Nutrition Therapy   Diet Instructed patient on a meal plan based  on heart healthy guidelines incorporating principles of DASH dit.   Protein (specify units) 8   Fiber 30 grams   Whole Grain Foods 3 servings   Saturated Fats 13 max. grams   Fruits and Vegetables 5 servings/day   Sodium 2000 grams     Personal Nutrition Goals  Personal Goal #1 To try margarines with no trans fat. Refer to examples given.   Personal Goal #2 To try Mueller's brand of whole grain pasta counting 1 cup of cooked pasta as 2 servings of whole grain. Also, use brown rice and oatmeal to boost whole grain intake.   Personal Goal #3 Use Mrs. DASH seasonings   Personal Goal #4 Look up nutrition information especially for restaurants using calorieking or myfitness pal app     Intervention Plan   Intervention Prescribe, educate and counsel regarding individualized specific dietary modifications aiming towards targeted core components such as weight, hypertension, lipid management, diabetes, heart failure and other comorbidities.;Nutrition handout(s) given to patient.   Expected Outcomes Short Term Goal: Understand basic principles of dietary content, such as calories, fat, sodium, cholesterol and nutrients.;Short Term Goal: A plan has been developed with personal nutrition goals set during dietitian appointment.;Long Term Goal: Adherence to prescribed nutrition plan.      Nutrition Discharge: Rate Your Plate Scores:     Nutrition Assessments - 09/19/16 1449      Rate Your Plate Scores   Pre Score 69   Pre Score % 90 %      Nutrition Goals Re-Evaluation:   Psychosocial: Target Goals: Acknowledge presence or absence of depression, maximize coping skills, provide positive support system. Participant is able to verbalize types and ability to use techniques and skills needed for reducing stress and depression.  Initial Review & Psychosocial Screening:     Initial Psych Review & Screening - 09/01/16 1631      Initial Review   Current issues with Current Stress Concerns      Family Dynamics   Good Support System? Yes   Comments Concerned out of work till Jan. Has 30 years in with [employer].     Barriers   Psychosocial barriers to participate in program The patient should benefit from training in stress management and relaxation.     Screening Interventions   Interventions Encouraged to exercise      Quality of Life Scores:   PHQ-9: Recent Review Flowsheet Data    Depression screen [facility] 2/9 09/01/2016   Decreased Interest 0   Down, Depressed, Hopeless 0   PHQ - 2 Score 0   Altered sleeping 2   Tired, decreased energy 2   Change in appetite 1   Feeling bad or failure about yourself  1   Trouble concentrating 0   Moving slowly or fidgety/restless 0   Suicidal thoughts 0   PHQ-9 Score 6   Difficult doing work/chores Not difficult at all      Psychosocial Evaluation and Intervention:   Psychosocial Re-Evaluation:   Vocational Rehabilitation: Provide vocational rehab assistance to qualifying candidates.   Vocational Rehab Evaluation & Intervention:     Vocational Rehab - 09/01/16 1612      Initial Vocational Rehab Evaluation & Intervention   Assessment shows need for Vocational Rehabilitation No      Education: Education Goals: Education classes will be provided on a weekly basis, covering required topics. Participant will state understanding/return demonstration of topics presented.  Learning Barriers/Preferences:     Learning Barriers/Preferences - 09/01/16 1639      Learning Barriers/Preferences   Learning Barriers None   Learning Preferences None      Education Topics: General Nutrition Guidelines/Fats and Fiber: -Group instruction provided by verbal, written material, models and posters to present the general guidelines for heart healthy nutrition. Gives an explanation and review of dietary fats and fiber.  Controlling Sodium/Reading Food Labels: -Group verbal and written material supporting the discussion of sodium use in  heart healthy nutrition. Review and explanation with models, verbal and written materials for utilization of the food label.   Exercise Physiology & Risk Factors: - Group verbal and written instruction with models to review the exercise physiology of the cardiovascular system and associated critical values. Details cardiovascular disease risk factors and the goals associated with each risk factor.   Aerobic Exercise & Resistance Training: - Gives group verbal and written discussion on the health impact of inactivity. On the components of aerobic and resistive training programs and the benefits of this training and how to safely progress through these programs.   Flexibility, Balance, General Exercise Guidelines: - Provides group verbal and written instruction on the benefits of flexibility and balance training programs. Provides general exercise guidelines with specific guidelines to those with heart or lung disease. Demonstration and skill practice provided.   Stress Management: - Provides group verbal and written instruction about the health risks of elevated stress, cause of high stress, and healthy ways to reduce stress.   Depression: - Provides group verbal and written instruction on the correlation between heart/lung disease and depressed mood, treatment options, and the stigmas associated with seeking treatment.   Anatomy & Physiology of the Heart: - Group verbal and written instruction and models provide basic cardiac anatomy and physiology, with the coronary electrical and arterial systems. Review of: AMI, Angina, Valve disease, Heart Failure, Cardiac Arrhythmia, Pacemakers, and the ICD.   Cardiac Procedures: - Group verbal and written instruction and models to describe the testing methods done to diagnose heart disease. Reviews the outcomes of the test results. Describes the treatment choices: Medical Management, Angioplasty, or Coronary Bypass Surgery.   Cardiac  Medications: - Group verbal and written instruction to review commonly prescribed medications for heart disease. Reviews the medication, class of the drug, and side effects. Includes the steps to properly store meds and maintain the prescription regimen. Flowsheet Row Cardiac Rehab from 09/19/2016 in [facility] Cardiac and Pulmonary Rehab  Date  09/07/16 [10/31 Part 2 SB]  Educator  C. EnterkinRN  Instruction Review Code  2- meets goals/outcomes      Go Sex-Intimacy & Heart Disease, Get SMART - Goal Setting: - Group verbal and written instruction through game format to discuss heart disease and the return to sexual intimacy. Provides group verbal and written material to discuss and apply goal setting through the application of the S.M.A.R.T. Method.   Other Matters of the Heart: - Provides group verbal, written materials and models to describe Heart Failure, Angina, Valve Disease, and Diabetes in the realm of heart disease. Includes description of the disease process and treatment options available to the cardiac patient.   Exercise & Equipment Safety: - Individual verbal instruction and demonstration of equipment use and safety with use of the equipment. Flowsheet Row Cardiac Rehab from 09/19/2016 in [facility] Cardiac and Pulmonary Rehab  Date  09/01/16  Educator  C. EnterkinRN  Instruction Review Code  2- meets goals/outcomes      Infection Prevention: - Provides verbal and written material to individual with discussion of infection control including proper hand washing and proper equipment cleaning during exercise session. Flowsheet Row Cardiac Rehab from 09/19/2016 in [facility] Cardiac and Pulmonary Rehab  Date  09/01/16  Educator  C. EnterkinRN  Instruction Review Code  2- meets goals/outcomes      Falls Prevention: - Provides verbal and written material to individual with discussion of  falls prevention and safety. Flowsheet Row Cardiac Rehab from 09/19/2016 in [facility] Cardiac and Pulmonary Rehab   Date  09/01/16  Educator  C. Morton  Instruction Review Code  2- meets goals/outcomes      Diabetes: - Individual verbal and written instruction to review signs/symptoms of diabetes, desired ranges of glucose level fasting, after meals and with exercise. Advice that pre and post exercise glucose checks will be done for 3 sessions at entry of program.    Knowledge Questionnaire Score:     Knowledge Questionnaire Score - 09/01/16 1633      Knowledge Questionnaire Score   Pre Score 21/28      Core Components/Risk Factors/Patient Goals at Admission:     Personal Goals and Risk Factors at Admission - 09/01/16 1630      Core Components/Risk Factors/Patient Goals on Admission    Weight Management Yes;Weight Maintenance   Intervention Weight Management: Develop a combined nutrition and exercise program designed to reach desired caloric intake, while maintaining appropriate intake of nutrient and fiber, sodium and fats, and appropriate energy expenditure required for the weight goal.;Weight Management: Provide education and appropriate resources to help participant work on and attain dietary goals.   Admit Weight 167 lb (75.8 kg)   Expected Outcomes Weight Maintenance: Understanding of the daily nutrition guidelines, which includes 25-35% calories from fat, 7% or less cal from saturated fats, less than 200mg  cholesterol, less than 1.5gm of sodium, & 5 or more servings of fruits and vegetables daily   Sedentary Yes   Intervention Provide advice, education, support and counseling about physical activity/exercise needs.;Develop an individualized exercise prescription for aerobic and resistive training based on initial evaluation findings, risk stratification, comorbidities and participant's personal goals.   Expected Outcomes Achievement of increased cardiorespiratory fitness and enhanced flexibility, muscular endurance and strength shown through measurements of functional capacity and  personal statement of participant.   Increase Strength and Stamina Yes   Intervention Provide advice, education, support and counseling about physical activity/exercise needs.;Develop an individualized exercise prescription for aerobic and resistive training based on initial evaluation findings, risk stratification, comorbidities and participant's personal goals.   Expected Outcomes Achievement of increased cardiorespiratory fitness and enhanced flexibility, muscular endurance and strength shown through measurements of functional capacity and personal statement of participant.   Hypertension Yes   Intervention Provide education on lifestyle modifcations including regular physical activity/exercise, weight management, moderate sodium restriction and increased consumption of fresh fruit, vegetables, and low fat dairy, alcohol moderation, and smoking cessation.;Monitor prescription use compliance.   Expected Outcomes Short Term: Continued assessment and intervention until BP is < 140/6mm HG in hypertensive participants. < 130/91mm HG in hypertensive participants with diabetes, heart failure or chronic kidney disease.;Long Term: Maintenance of blood pressure at goal levels.   Lipids Yes   Intervention Provide education and support for participant on nutrition & aerobic/resistive exercise along with prescribed medications to achieve LDL 70mg , HDL >40mg .   Expected Outcomes Short Term: Participant states understanding of desired cholesterol values and is compliant with medications prescribed. Participant is following exercise prescription and nutrition guidelines.;Long Term: Cholesterol controlled with medications as prescribed, with individualized exercise RX and with personalized nutrition plan. Value goals: LDL < 70mg , HDL > 40 mg.   Stress Yes   Intervention Offer individual and/or small group education and counseling on adjustment to heart disease, stress management and health-related lifestyle change.  Teach and support self-help strategies.;Refer participants experiencing significant psychosocial distress to appropriate mental health specialists for further evaluation and treatment. When  possible, include family members and significant others in education/counseling sessions.   Expected Outcomes Short Term: Participant demonstrates changes in health-related behavior, relaxation and other stress management skills, ability to obtain effective social support, and compliance with psychotropic medications if prescribed.;Long Term: Emotional wellbeing is indicated by absence of clinically significant psychosocial distress or social isolation.  Concerned out of work till Jan. Has 30 years in with [employer].       Core Components/Risk Factors/Patient Goals Review:    Core Components/Risk Factors/Patient Goals at Discharge (Final Review):    ITP Comments:     ITP Comments    Row Name 09/01/16 1631 09/19/16 0822 09/20/16 0656       ITP Comments ITP started during Medical Review /Orientation appt after Cardiac Rehab informed consent signed.  Edoardo brought in his home blood pressure cuff.  The systolic number was comparable, but the diastolic was 30 mmHG higher than what we got.  He will continue to monitor at home. 30 day review completed for Medical Director physician review and signature. Continue ITP unless changes made by physician.        Comments:

## 2016-09-21 DIAGNOSIS — I214 Non-ST elevation (NSTEMI) myocardial infarction: Secondary | ICD-10-CM

## 2016-09-21 DIAGNOSIS — Z951 Presence of aortocoronary bypass graft: Secondary | ICD-10-CM

## 2016-09-21 DIAGNOSIS — Z48812 Encounter for surgical aftercare following surgery on the circulatory system: Secondary | ICD-10-CM | POA: Diagnosis not present

## 2016-09-21 NOTE — Progress Notes (Signed)
Daily Session Note  Patient Details  Name: Nicholas Torres MRN: [id number] Date of Birth: [birth year] Referring Provider:   Flowsheet Row Cardiac Rehab from 09/01/2016 in [facility] Cardiac and Pulmonary Rehab  Referring Provider  Neoma Laming MD      Encounter Date: 09/21/2016  Check In:     Session Check In - 09/21/16 0837      Check-In   Location [facility]-Cardiac & Pulmonary Rehab   Staff Present Gerlene Burdock, RN, BSN;Jessica Luan Pulling, MA, ACSM RCEP, Exercise Physiologist;Amanda Oletta Darter, BA, ACSM CEP, Exercise Physiologist   Supervising physician immediately available to respond to emergencies See telemetry face sheet for immediately available ER MD   Medication changes reported     No   Fall or balance concerns reported    No   Warm-up and Cool-down Performed on first and last piece of equipment   Resistance Training Performed Yes   VAD Patient? No     Pain Assessment   Currently in Pain? No/denies   Multiple Pain Sites No         Goals Met:  Independence with exercise equipment Exercise tolerated well No report of cardiac concerns or symptoms Strength training completed today  Goals Unmet:  Not Applicable  Comments: Pt able to follow exercise prescription today without complaint.  Will continue to monitor for progression.    Dr. Emily Filbert is Medical Director for [place] and [facility].

## 2016-09-26 ENCOUNTER — Encounter: Payer: BLUE CROSS/BLUE SHIELD | Admitting: *Deleted

## 2016-09-26 DIAGNOSIS — Z48812 Encounter for surgical aftercare following surgery on the circulatory system: Secondary | ICD-10-CM | POA: Diagnosis not present

## 2016-09-26 DIAGNOSIS — I214 Non-ST elevation (NSTEMI) myocardial infarction: Secondary | ICD-10-CM

## 2016-09-26 DIAGNOSIS — Z951 Presence of aortocoronary bypass graft: Secondary | ICD-10-CM

## 2016-09-26 NOTE — Progress Notes (Signed)
Daily Session Note  Patient Details  Name: Nicholas Torres MRN: [id number] Date of Birth: September 23, 1961 Referring Provider:   Flowsheet Row Cardiac Rehab from 09/01/2016 in [facility] Cardiac and Pulmonary Rehab  Referring Provider  Neoma Laming MD      Encounter Date: 09/26/2016  Check In:     Session Check In - 09/26/16 0821      Check-In   Location [facility]-Cardiac & Pulmonary Rehab   Staff Present Alberteen Sam, MA, ACSM RCEP, Exercise Physiologist;Susanne Bice, RN, BSN, CCRP;Laureen Owens Shark, BS, RRT, Respiratory Therapist   Supervising physician immediately available to respond to emergencies See telemetry face sheet for immediately available ER MD   Medication changes reported     No   Fall or balance concerns reported    No   Warm-up and Cool-down Performed on first and last piece of equipment   Resistance Training Performed Yes   VAD Patient? No     Pain Assessment   Currently in Pain? No/denies   Multiple Pain Sites No         Goals Met:  Independence with exercise equipment Exercise tolerated well No report of cardiac concerns or symptoms Strength training completed today  Goals Unmet:  Not Applicable  Comments: Pt able to follow exercise prescription today without complaint.  Will continue to monitor for progression.    Dr. Emily Filbert is Medical Director for [place] and [facility].

## 2016-09-28 ENCOUNTER — Encounter: Payer: BLUE CROSS/BLUE SHIELD | Admitting: *Deleted

## 2016-09-28 DIAGNOSIS — Z48812 Encounter for surgical aftercare following surgery on the circulatory system: Secondary | ICD-10-CM | POA: Diagnosis not present

## 2016-09-28 DIAGNOSIS — Z951 Presence of aortocoronary bypass graft: Secondary | ICD-10-CM

## 2016-09-28 DIAGNOSIS — I214 Non-ST elevation (NSTEMI) myocardial infarction: Secondary | ICD-10-CM

## 2016-09-28 NOTE — Progress Notes (Signed)
Daily Session Note  Patient Details  Name: Nicholas Torres MRN: [id number] Date of Birth: 1960/12/26 Referring Provider:   Flowsheet Row Cardiac Rehab from 09/01/2016 in [facility] Cardiac and Pulmonary Rehab  Referring Provider  Neoma Laming MD      Encounter Date: 09/28/2016  Check In:     Session Check In - 09/28/16 0829      Check-In   Location [facility]-Cardiac & Pulmonary Rehab   Staff Present Alberteen Sam, MA, ACSM RCEP, Exercise Physiologist;Amanda Oletta Darter, BA, ACSM CEP, Exercise Physiologist;Carroll Enterkin, RN, BSN;Other   Supervising physician immediately available to respond to emergencies See telemetry face sheet for immediately available ER MD   Medication changes reported     No   Fall or balance concerns reported    No   Warm-up and Cool-down Performed on first and last piece of equipment   Resistance Training Performed Yes   VAD Patient? No     Pain Assessment   Currently in Pain? No/denies   Multiple Pain Sites No         Goals Met:  Independence with exercise equipment Exercise tolerated well No report of cardiac concerns or symptoms Strength training completed today  Goals Unmet:  Not Applicable  Comments: Pt able to follow exercise prescription today without complaint.  Will continue to monitor for progression.    Dr. Emily Filbert is Medical Director for [place] and [facility].

## 2016-10-03 ENCOUNTER — Encounter: Payer: BLUE CROSS/BLUE SHIELD | Admitting: *Deleted

## 2016-10-03 DIAGNOSIS — Z48812 Encounter for surgical aftercare following surgery on the circulatory system: Secondary | ICD-10-CM | POA: Diagnosis not present

## 2016-10-03 DIAGNOSIS — Z951 Presence of aortocoronary bypass graft: Secondary | ICD-10-CM

## 2016-10-03 DIAGNOSIS — I214 Non-ST elevation (NSTEMI) myocardial infarction: Secondary | ICD-10-CM

## 2016-10-03 NOTE — Progress Notes (Signed)
Daily Session Note  Patient Details  Name: Nicholas Torres MRN: [id number] Date of Birth: 13-Mar-1961 Referring Provider:   Flowsheet Row Cardiac Rehab from 09/01/2016 in [facility] Cardiac and Pulmonary Rehab  Referring Provider  Neoma Laming MD      Encounter Date: 10/03/2016  Check In:     Session Check In - 10/03/16 0828      Check-In   Location [facility]-Cardiac & Pulmonary Rehab   Staff Present Alberteen Sam, MA, ACSM RCEP, Exercise Physiologist;Susanne Bice, RN, BSN, CCRP;Laureen Owens Shark, BS, RRT, Respiratory Therapist   Supervising physician immediately available to respond to emergencies See telemetry face sheet for immediately available ER MD   Medication changes reported     No   Fall or balance concerns reported    No   Warm-up and Cool-down Performed as group-led instruction   Resistance Training Performed Yes   VAD Patient? No     Pain Assessment   Currently in Pain? No/denies   Multiple Pain Sites No         Goals Met:  Independence with exercise equipment Exercise tolerated well No report of cardiac concerns or symptoms Strength training completed today  Goals Unmet:  Not Applicable  Comments: Pt able to follow exercise prescription today without complaint.  Will continue to monitor for progression.    Dr. Emily Filbert is Medical Director for [place] and [facility].

## 2016-10-10 ENCOUNTER — Encounter: Payer: BLUE CROSS/BLUE SHIELD | Admitting: *Deleted

## 2016-10-10 DIAGNOSIS — Z951 Presence of aortocoronary bypass graft: Secondary | ICD-10-CM

## 2016-10-10 DIAGNOSIS — I214 Non-ST elevation (NSTEMI) myocardial infarction: Secondary | ICD-10-CM

## 2016-10-10 DIAGNOSIS — Z48812 Encounter for surgical aftercare following surgery on the circulatory system: Secondary | ICD-10-CM | POA: Diagnosis not present

## 2016-10-10 NOTE — Progress Notes (Signed)
Daily Session Note  Patient Details  Name: Nicholas Torres MRN: [id number] Date of Birth: 1961/07/04 Referring Provider:   Flowsheet Row Cardiac Rehab from 09/01/2016 in [facility] Cardiac and Pulmonary Rehab  Referring Provider  Neoma Laming MD      Encounter Date: 10/10/2016  Check In:     Session Check In - 10/10/16 0842      Check-In   Location [facility]-Cardiac & Pulmonary Rehab   Staff Present Alberteen Sam, MA, ACSM RCEP, Exercise Physiologist;Susanne Bice, RN, BSN, CCRP;Laureen Owens Shark, BS, RRT, Respiratory Therapist   Supervising physician immediately available to respond to emergencies See telemetry face sheet for immediately available ER MD   Medication changes reported     No   Fall or balance concerns reported    No   Warm-up and Cool-down Performed on first and last piece of equipment   Resistance Training Performed Yes     Pain Assessment   Currently in Pain? No/denies         Goals Met:  Independence with exercise equipment Exercise tolerated well No report of cardiac concerns or symptoms Strength training completed today  Goals Unmet:  Not Applicable  Comments: Pt able to follow exercise prescription today without complaint.  Will continue to monitor for progression.    Dr. Emily Filbert is Medical Director for [place] and [facility].

## 2016-10-12 DIAGNOSIS — Z951 Presence of aortocoronary bypass graft: Secondary | ICD-10-CM

## 2016-10-12 DIAGNOSIS — Z48812 Encounter for surgical aftercare following surgery on the circulatory system: Secondary | ICD-10-CM | POA: Diagnosis not present

## 2016-10-12 DIAGNOSIS — I214 Non-ST elevation (NSTEMI) myocardial infarction: Secondary | ICD-10-CM

## 2016-10-12 NOTE — Progress Notes (Signed)
Daily Session Note  Patient Details  Name: Nicholas Torres MRN: [id number] Date of Birth: 1961/03/23 Referring Provider:   Flowsheet Row Cardiac Rehab from 09/01/2016 in [facility] Cardiac and Pulmonary Rehab  Referring Provider  Neoma Laming MD      Encounter Date: 10/12/2016  Check In:     Session Check In - 10/12/16 1754      Check-In   Location [facility]-Cardiac & Pulmonary Rehab   Staff Present Earlean Shawl, BS, ACSM CEP, Exercise Physiologist;Amanda Oletta Darter, BA, ACSM CEP, Exercise Physiologist   Supervising physician immediately available to respond to emergencies See telemetry face sheet for immediately available ER MD   Medication changes reported     No   Fall or balance concerns reported    No   Warm-up and Cool-down Performed on first and last piece of equipment   Resistance Training Performed Yes   VAD Patient? No     Pain Assessment   Currently in Pain? No/denies   Multiple Pain Sites No         Goals Met:  Independence with exercise equipment Exercise tolerated well No report of cardiac concerns or symptoms Strength training completed today  Goals Unmet:  Not Applicable  Comments: Pt able to follow exercise prescription today without complaint.  Will continue to monitor for progression.    Dr. Emily Filbert is Medical Director for [place] and [facility].

## 2016-10-17 ENCOUNTER — Encounter: Payer: BLUE CROSS/BLUE SHIELD | Attending: Cardiovascular Disease | Admitting: Respiratory Therapy

## 2016-10-17 DIAGNOSIS — Z951 Presence of aortocoronary bypass graft: Secondary | ICD-10-CM | POA: Diagnosis present

## 2016-10-17 DIAGNOSIS — I214 Non-ST elevation (NSTEMI) myocardial infarction: Secondary | ICD-10-CM

## 2016-10-17 DIAGNOSIS — Z48812 Encounter for surgical aftercare following surgery on the circulatory system: Secondary | ICD-10-CM | POA: Insufficient documentation

## 2016-10-17 DIAGNOSIS — I252 Old myocardial infarction: Secondary | ICD-10-CM | POA: Diagnosis not present

## 2016-10-17 DIAGNOSIS — Z9889 Other specified postprocedural states: Secondary | ICD-10-CM | POA: Insufficient documentation

## 2016-10-17 NOTE — Progress Notes (Signed)
Daily Session Note  Patient Details  Name: Nicholas Torres MRN: [id number] Date of Birth: [birth year] Referring Provider:   Flowsheet Row Cardiac Rehab from 09/01/2016 in [facility] Cardiac and Pulmonary Rehab  Referring Provider  Neoma Laming MD      Encounter Date: 10/17/2016  Check In:     Session Check In - 10/17/16 0844      Check-In   Location [facility]-Cardiac & Pulmonary Rehab   Staff Present Alberteen Sam, MA, ACSM RCEP, Exercise Physiologist;Susanne Bice, RN, BSN, CCRP;Laureen Owens Shark, BS, RRT, Respiratory Therapist   Supervising physician immediately available to respond to emergencies See telemetry face sheet for immediately available ER MD   Medication changes reported     No   Fall or balance concerns reported    No   Warm-up and Cool-down Performed on first and last piece of equipment   Resistance Training Performed Yes   VAD Patient? No     Pain Assessment   Currently in Pain? No/denies         Goals Met:  Independence with exercise equipment Exercise tolerated well No report of cardiac concerns or symptoms Strength training completed today  Goals Unmet:  Not Applicable  Comments: Pt able to follow exercise prescription today without complaint.  Will continue to monitor for progression.    Dr. Emily Filbert is Medical Director for [place] and [facility].

## 2016-10-18 ENCOUNTER — Encounter: Payer: Self-pay | Admitting: *Deleted

## 2016-10-18 DIAGNOSIS — I214 Non-ST elevation (NSTEMI) myocardial infarction: Secondary | ICD-10-CM

## 2016-10-18 DIAGNOSIS — Z951 Presence of aortocoronary bypass graft: Secondary | ICD-10-CM

## 2016-10-18 NOTE — Progress Notes (Signed)
Cardiac Individual Treatment Plan  Patient Details  Name: Nicholas Torres MRN: [id number] Date of Birth: 1961-07-25 Referring Provider:   Flowsheet Row Cardiac Rehab from 09/01/2016 in [facility] Cardiac and Pulmonary Rehab  Referring Provider  Neoma Laming MD      Initial Encounter Date:  Flowsheet Row Cardiac Rehab from 09/01/2016 in [facility] Cardiac and Pulmonary Rehab  Date  09/02/16  Referring Provider  Neoma Laming MD      Visit Diagnosis: NSTEMI (non-ST elevated myocardial infarction) ([place])  S/P CABG x 4  Patient's Home Medications on Admission:  Current Outpatient Prescriptions:  .  aspirin EC 325 MG EC tablet, Take 1 tablet (325 mg total) by mouth daily., Disp: 30 tablet, Rfl: 0 .  fluticasone (FLONASE) 50 MCG/ACT nasal spray, Place 1 spray into both nostrils at bedtime as needed for rhinitis., Disp: , Rfl:  .  metoprolol tartrate (LOPRESSOR) 25 MG tablet, 12.5 mg. , Disp: , Rfl:  .  rosuvastatin (CRESTOR) 10 MG tablet, Take 10 mg by mouth daily., Disp: , Rfl:   Past Medical History: Past Medical History:  Diagnosis Date  . CAD (coronary artery disease)   . High cholesterol   . Hypertension   . Pollen allergy     Tobacco Use: History  Smoking Status  . Never Smoker  Smokeless Tobacco  . Never Used    Labs: Recent Review Flowsheet Data    Labs for ITP Cardiac and Pulmonary Rehab Latest Ref Rng & Units 07/26/2016 07/26/2016 07/26/2016 07/27/2016 07/27/2016   Cholestrol 0 - 200 mg/dL - - - - -   LDLCALC 0 - 99 mg/dL - - - - -   HDL >40 mg/dL - - - - -   Trlycerides <150 mg/dL - - - - -   Hemoglobin A1c 4.8 - 5.6 % - - - - -   PHART 7.350 - 7.450 7.387 - 7.334(L) 7.330(L) -   PCO2ART 32.0 - 48.0 mmHg 36.8 - 40.7 42.3 -   HCO3 20.0 - 28.0 mmol/L 22.5 - 21.1 21.9 -   TCO2 0 - 100 mmol/L 24 23 22 23 25    ACIDBASEDEF 0.0 - 2.0 mmol/L 3.0(H) - 4.0(H) 3.0(H) -   O2SAT % 95.0 - 98.0 99.0 -       Exercise Target Goals:    Exercise Program Goal: Individual exercise  prescription set with THRR, safety & activity barriers. Participant demonstrates ability to understand and report RPE using BORG scale, to self-measure pulse accurately, and to acknowledge the importance of the exercise prescription.  Exercise Prescription Goal: Starting with aerobic activity 30 plus minutes a day, 3 days per week for initial exercise prescription. Provide home exercise prescription and guidelines that participant acknowledges understanding prior to discharge.  Activity Barriers & Risk Stratification:     Activity Barriers & Cardiac Risk Stratification - 09/01/16 1639      Activity Barriers & Cardiac Risk Stratification   Activity Barriers None      6 Minute Walk:     6 Minute Walk    Row Name 09/01/16 1636         6 Minute Walk   Phase Initial     Distance 1410 feet     Walk Time 6 minutes     # of Rest Breaks 0     MPH 2.67     METS 4.15     RPE 11     Perceived Dyspnea  0     VO2 Peak 14.54  Symptoms No     Resting HR 101 bpm     Resting BP 128/80     Max Ex. HR 123 bpm     Max Ex. BP 118/74     2 Minute Post BP 104/78        Initial Exercise Prescription:     Initial Exercise Prescription - 09/04/16 1300      Date of Initial Exercise RX and Referring Provider   Date 09/02/16   Referring Provider Neoma Laming MD     Treadmill   MPH 2.5   Grade 2   Minutes 15   METs 3.6     Recumbant Elliptical   Level 2   RPM 50   Minutes 15   METs 2     T5 Nustep   Level 3   Minutes 15   METs 2     Prescription Details   Frequency (times per week) 2   Duration Progress to 45 minutes of aerobic exercise without signs/symptoms of physical distress     Intensity   THRR 40-80% of Max Heartrate 127-154   Ratings of Perceived Exertion 11-15   Perceived Dyspnea 0-4     Progression   Progression Continue to progress workloads to maintain intensity without signs/symptoms of physical distress.     Resistance Training   Training  Prescription Yes   Weight 3 lbs   Reps 10-12      Perform Capillary Blood Glucose checks as needed.  Exercise Prescription Changes:     Exercise Prescription Changes    Row Name 09/01/16 1600 09/13/16 0900 09/26/16 1500 10/11/16 1500       Exercise Review   Progression -  Walk test results Yes Yes Yes      Response to Exercise   Blood Pressure (Admit) 128/80 132/70 126/80 120/82    Blood Pressure (Exercise) 118/74 134/72 124/70 148/78    Blood Pressure (Exit) 104/78 124/78 114/60 112/60    Heart Rate (Admit) 101 bpm 61 bpm 83 bpm 107 bpm    Heart Rate (Exercise) 123 bpm 132 bpm 137 bpm 156 bpm    Heart Rate (Exit) 94 bpm 82 bpm 90 bpm 101 bpm    Oxygen Saturation (Admit) 96 %  -  -  -    Oxygen Saturation (Exercise) 99 %  -  -  -    Rating of Perceived Exertion (Exercise) 11 12 12 14     Symptoms none none none none    Comments  -  - Home Exercise Guidelines given 09/12/16 Home Exercise Guidelines given 09/12/16    Duration  - Progress to 45 minutes of aerobic exercise without signs/symptoms of physical distress Progress to 45 minutes of aerobic exercise without signs/symptoms of physical distress Progress to 45 minutes of aerobic exercise without signs/symptoms of physical distress    Intensity  - THRR unchanged THRR unchanged THRR unchanged      Progression   Progression  - Continue to progress workloads to maintain intensity without signs/symptoms of physical distress. Continue to progress workloads to maintain intensity without signs/symptoms of physical distress. Continue to progress workloads to maintain intensity without signs/symptoms of physical distress.    Average METs  - 2.84 3.52 6.35      Resistance Training   Training Prescription  - Yes Yes Yes    Weight  - 5 lbs 5 lbs 5 lbs    Reps  - 10-12 10-12 10-12      Interval Training   Interval  Training  - No No Yes    Equipment  -  -  - T5 Nustep    Comments  -  -  - 2 min off  30 sec on      Treadmill   MPH   - 3 3.5 3.5    Grade  - 2 2 2     Minutes  - 15 15 15     METs  - 4.12 4.65 4.65      Recumbant Elliptical   Level  - 2 4 5     RPM  - 50 50 50    Minutes  - 15 15 15     METs  - 2.1 3.2 4.8      T5 Nustep   Level  - 3 4 4     Minutes  - 15 15 15     METs  - 2.3 2.7 9.6      Home Exercise Plan   Plans to continue exercise at  -  - Home  gym in complex (treadmill, weights) and walking Home  gym in complex (treadmill, weights) and walking    Frequency  -  - Add 3 additional days to program exercise sessions. Add 3 additional days to program exercise sessions.       Exercise Comments:     Exercise Comments    Row Name 09/07/16 0940 09/13/16 0935 09/26/16 1547 10/11/16 1504     Exercise Comments First full day of exercise!  Patient was oriented to gym and equipment including functions, settings, policies, and procedures.  Patient's individual exercise prescription and treatment plan were reviewed.  All starting workloads were established based on the results of the 6 minute walk test done at initial orientation visit.  The plan for exercise progression was also introduced and progression will be customized based on patient's performance and goals. Payden is off to a great start with exercise.  We will continue to work with him on progression. Brennyn is doing well in rehab.  He is asking good questions in education classes and about his exercise routine.  We will continue to monitor his progression. Reed continues to do well in rehab.  He is starting to move up his workloads more.  We will continue to monitor his progression.       Discharge Exercise Prescription (Final Exercise Prescription Changes):     Exercise Prescription Changes - 10/11/16 1500      Exercise Review   Progression Yes     Response to Exercise   Blood Pressure (Admit) 120/82   Blood Pressure (Exercise) 148/78   Blood Pressure (Exit) 112/60   Heart Rate (Admit) 107 bpm   Heart Rate (Exercise) 156 bpm    Heart Rate (Exit) 101 bpm   Rating of Perceived Exertion (Exercise) 14   Symptoms none   Comments Home Exercise Guidelines given 09/12/16   Duration Progress to 45 minutes of aerobic exercise without signs/symptoms of physical distress   Intensity THRR unchanged     Progression   Progression Continue to progress workloads to maintain intensity without signs/symptoms of physical distress.   Average METs 6.35     Resistance Training   Training Prescription Yes   Weight 5 lbs   Reps 10-12     Interval Training   Interval Training Yes   Equipment T5 Nustep   Comments 2 min off  30 sec on     Treadmill   MPH 3.5   Grade 2   Minutes 15   METs  4.65     Recumbant Elliptical   Level 5   RPM 50   Minutes 15   METs 4.8     T5 Nustep   Level 4   Minutes 15   METs 9.6     Home Exercise Plan   Plans to continue exercise at Home  gym in complex (treadmill, weights) and walking   Frequency Add 3 additional days to program exercise sessions.      Nutrition:  Target Goals: Understanding of nutrition guidelines, daily intake of sodium 1500mg , cholesterol 200mg , calories 30% from fat and 7% or less from saturated fats, daily to have 5 or more servings of fruits and vegetables.  Biometrics:     Pre Biometrics - 09/01/16 1638      Pre Biometrics   Height 5' 5.5" (1.664 m)   Weight 167 lb (75.8 kg)   Waist Circumference 35 inches   Hip Circumference 38.25 inches   Waist to Hip Ratio 0.92 %   BMI (Calculated) 27.4   Single Leg Stand 30 seconds       Nutrition Therapy Plan and Nutrition Goals:     Nutrition Therapy & Goals - 10/03/16 0940      Personal Nutrition Goals   Personal Goal #2 To try Mueller's brand of whole grain pasta counting 1 cup of cooked pasta as 2 servings of whole grain. Also, use brown rice and oatmeal to boost whole grain intake.      Nutrition Discharge: Rate Your Plate Scores:     Nutrition Assessments - 09/19/16 1449      Rate Your  Plate Scores   Pre Score 69   Pre Score % 90 %      Nutrition Goals Re-Evaluation:     Nutrition Goals Re-Evaluation    Row Name 10/03/16 0941 10/03/16 0958           Personal Goal #1 Re-Evaluation   Personal Goal #1 To try margarines with no trans fat. Refer to examples given. To try margarines with no trans fat. Refer to examples given.      Goal Progress Seen No  -      Comments Not shopped for this yet  Expected outcome: Will look for this type of butter when he needs to replenish butter at home. Not shopped for this yet  Expected outcome: Will look for this type of butter when he needs to replenish butter at home.        Personal Goal #2 Re-Evaluation   Personal Goal #2 To try Mueller's brand of whole grain pasta counting 1 cup of cooked pasta as 2 servings of whole grain. Also, use brown rice and oatmeal to boost whole grain intake. To try Mueller's brand of whole grain pasta counting 1 cup of cooked pasta as 2 servings of whole grain. Also, use brown rice and oatmeal to boost whole grain intake.      Goal Progress Seen Yes  -      Comments Is eating brown rice already.  Will try whole wheat pasta.  Expected outcome:  Is eating brown rice already.  Will try whole wheat pasta.  Expected outcome:  Comtinue to shop for the whole grain pasta and rice        Personal Goal #3 Re-Evaluation   Personal Goal #3 Use Mrs. DASH seasonings Use Mrs. DASH seasonings      Goal Progress Seen  - Yes      Comments  - HAs already made changes  in spice use. Expected outcome: Continued use of spices without added sodium        Personal Goal #4 Re-Evaluation   Personal Goal #4 Look up nutrition information especially for restaurants using calorieking or myfitness pal app Look up nutrition information especially for restaurants using calorieking or myfitness pal app      Goal Progress Seen  - Yes      Comments  - Is starting to read the nutrition ;abels at restaurants.    Expected outcome: Continue to  read the nutrition labels         Psychosocial: Target Goals: Acknowledge presence or absence of depression, maximize coping skills, provide positive support system. Participant is able to verbalize types and ability to use techniques and skills needed for reducing stress and depression.  Initial Review & Psychosocial Screening:     Initial Psych Review & Screening - 09/01/16 1631      Initial Review   Current issues with Current Stress Concerns     Family Dynamics   Good Support System? Yes   Comments Concerned out of work till Jan. Has 30 years in with [employer].     Barriers   Psychosocial barriers to participate in program The patient should benefit from training in stress management and relaxation.     Screening Interventions   Interventions Encouraged to exercise      Quality of Life Scores:   PHQ-9: Recent Review Flowsheet Data    Depression screen [facility] 2/9 09/01/2016   Decreased Interest 0   Down, Depressed, Hopeless 0   PHQ - 2 Score 0   Altered sleeping 2   Tired, decreased energy 2   Change in appetite 1   Feeling bad or failure about yourself  1   Trouble concentrating 0   Moving slowly or fidgety/restless 0   Suicidal thoughts 0   PHQ-9 Score 6   Difficult doing work/chores Not difficult at all      Psychosocial Evaluation and Intervention:   Psychosocial Re-Evaluation:   Vocational Rehabilitation: Provide vocational rehab assistance to qualifying candidates.   Vocational Rehab Evaluation & Intervention:     Vocational Rehab - 09/01/16 1612      Initial Vocational Rehab Evaluation & Intervention   Assessment shows need for Vocational Rehabilitation No      Education: Education Goals: Education classes will be provided on a weekly basis, covering required topics. Participant will state understanding/return demonstration of topics presented.  Learning Barriers/Preferences:     Learning Barriers/Preferences - 09/01/16 1639      Learning  Barriers/Preferences   Learning Barriers None   Learning Preferences None      Education Topics: General Nutrition Guidelines/Fats and Fiber: -Group instruction provided by verbal, written material, models and posters to present the general guidelines for heart healthy nutrition. Gives an explanation and review of dietary fats and fiber. Flowsheet Row Cardiac Rehab from 10/17/2016 in [facility] Cardiac and Pulmonary Rehab  Date  09/26/16  Educator  SB  Instruction Review Code  2- meets goals/outcomes      Controlling Sodium/Reading Food Labels: -Group verbal and written material supporting the discussion of sodium use in heart healthy nutrition. Review and explanation with models, verbal and written materials for utilization of the food label. Flowsheet Row Cardiac Rehab from 10/17/2016 in [facility] Cardiac and Pulmonary Rehab  Date  10/03/16  Educator  PI  Instruction Review Code  2- meets goals/outcomes      Exercise Physiology & Risk Factors: - Group verbal  and written instruction with models to review the exercise physiology of the cardiovascular system and associated critical values. Details cardiovascular disease risk factors and the goals associated with each risk factor. Flowsheet Row Cardiac Rehab from 10/17/2016 in [facility] Cardiac and Pulmonary Rehab  Date  09/21/16  Educator  [facility]  Instruction Review Code  2- meets goals/outcomes      Aerobic Exercise & Resistance Training: - Gives group verbal and written discussion on the health impact of inactivity. On the components of aerobic and resistive training programs and the benefits of this training and how to safely progress through these programs. Flowsheet Row Cardiac Rehab from 10/17/2016 in [facility] Cardiac and Pulmonary Rehab  Date  10/10/16  Educator  [facility]  Instruction Review Code  2- meets goals/outcomes      Flexibility, Balance, General Exercise Guidelines: - Provides group verbal and written instruction on the benefits of  flexibility and balance training programs. Provides general exercise guidelines with specific guidelines to those with heart or lung disease. Demonstration and skill practice provided.   Stress Management: - Provides group verbal and written instruction about the health risks of elevated stress, cause of high stress, and healthy ways to reduce stress.   Depression: - Provides group verbal and written instruction on the correlation between heart/lung disease and depressed mood, treatment options, and the stigmas associated with seeking treatment. Flowsheet Row Cardiac Rehab from 10/17/2016 in [facility] Cardiac and Pulmonary Rehab  Date  09/28/16  Educator  CE  Instruction Review Code  2- meets goals/outcomes      Anatomy & Physiology of the Heart: - Group verbal and written instruction and models provide basic cardiac anatomy and physiology, with the coronary electrical and arterial systems. Review of: AMI, Angina, Valve disease, Heart Failure, Cardiac Arrhythmia, Pacemakers, and the ICD. Flowsheet Row Cardiac Rehab from 10/17/2016 in [facility] Cardiac and Pulmonary Rehab  Date  10/17/16  Educator  SB  Instruction Review Code  2- meets goals/outcomes      Cardiac Procedures: - Group verbal and written instruction and models to describe the testing methods done to diagnose heart disease. Reviews the outcomes of the test results. Describes the treatment choices: Medical Management, Angioplasty, or Coronary Bypass Surgery.   Cardiac Medications: - Group verbal and written instruction to review commonly prescribed medications for heart disease. Reviews the medication, class of the drug, and side effects. Includes the steps to properly store meds and maintain the prescription regimen. Flowsheet Row Cardiac Rehab from 10/17/2016 in [facility] Cardiac and Pulmonary Rehab  Date  09/07/16 [10/31 Part 2 SB]  Educator  C. EnterkinRN  Instruction Review Code  2- meets goals/outcomes      Go Sex-Intimacy &  Heart Disease, Get SMART - Goal Setting: - Group verbal and written instruction through game format to discuss heart disease and the return to sexual intimacy. Provides group verbal and written material to discuss and apply goal setting through the application of the S.M.A.R.T. Method.   Other Matters of the Heart: - Provides group verbal, written materials and models to describe Heart Failure, Angina, Valve Disease, and Diabetes in the realm of heart disease. Includes description of the disease process and treatment options available to the cardiac patient.   Exercise & Equipment Safety: - Individual verbal instruction and demonstration of equipment use and safety with use of the equipment. Flowsheet Row Cardiac Rehab from 10/17/2016 in [facility] Cardiac and Pulmonary Rehab  Date  09/01/16  Educator  C. EnterkinRN  Instruction Review Code  2-  meets goals/outcomes      Infection Prevention: - Provides verbal and written material to individual with discussion of infection control including proper hand washing and proper equipment cleaning during exercise session. Flowsheet Row Cardiac Rehab from 10/17/2016 in [facility] Cardiac and Pulmonary Rehab  Date  09/01/16  Educator  C. EnterkinRN  Instruction Review Code  2- meets goals/outcomes      Falls Prevention: - Provides verbal and written material to individual with discussion of falls prevention and safety. Flowsheet Row Cardiac Rehab from 10/17/2016 in [facility] Cardiac and Pulmonary Rehab  Date  09/01/16  Educator  [employer]  Instruction Review Code  2- meets goals/outcomes      Diabetes: - Individual verbal and written instruction to review signs/symptoms of diabetes, desired ranges of glucose level fasting, after meals and with exercise. Advice that pre and post exercise glucose checks will be done for 3 sessions at entry of program.    Knowledge Questionnaire Score:     Knowledge Questionnaire Score - 09/01/16 1633      Knowledge  Questionnaire Score   Pre Score 21/28      Core Components/Risk Factors/Patient Goals at Admission:     Personal Goals and Risk Factors at Admission - 09/01/16 1630      Core Components/Risk Factors/Patient Goals on Admission    Weight Management Yes;Weight Maintenance   Intervention Weight Management: Develop a combined nutrition and exercise program designed to reach desired caloric intake, while maintaining appropriate intake of nutrient and fiber, sodium and fats, and appropriate energy expenditure required for the weight goal.;Weight Management: Provide education and appropriate resources to help participant work on and attain dietary goals.   Admit Weight 167 lb (75.8 kg)   Expected Outcomes Weight Maintenance: Understanding of the daily nutrition guidelines, which includes 25-35% calories from fat, 7% or less cal from saturated fats, less than 200mg  cholesterol, less than 1.5gm of sodium, & 5 or more servings of fruits and vegetables daily   Sedentary Yes   Intervention Provide advice, education, support and counseling about physical activity/exercise needs.;Develop an individualized exercise prescription for aerobic and resistive training based on initial evaluation findings, risk stratification, comorbidities and participant's personal goals.   Expected Outcomes Achievement of increased cardiorespiratory fitness and enhanced flexibility, muscular endurance and strength shown through measurements of functional capacity and personal statement of participant.   Increase Strength and Stamina Yes   Intervention Provide advice, education, support and counseling about physical activity/exercise needs.;Develop an individualized exercise prescription for aerobic and resistive training based on initial evaluation findings, risk stratification, comorbidities and participant's personal goals.   Expected Outcomes Achievement of increased cardiorespiratory fitness and enhanced flexibility, muscular  endurance and strength shown through measurements of functional capacity and personal statement of participant.   Hypertension Yes   Intervention Provide education on lifestyle modifcations including regular physical activity/exercise, weight management, moderate sodium restriction and increased consumption of fresh fruit, vegetables, and low fat dairy, alcohol moderation, and smoking cessation.;Monitor prescription use compliance.   Expected Outcomes Short Term: Continued assessment and intervention until BP is < 140/48mm HG in hypertensive participants. < 130/38mm HG in hypertensive participants with diabetes, heart failure or chronic kidney disease.;Long Term: Maintenance of blood pressure at goal levels.   Lipids Yes   Intervention Provide education and support for participant on nutrition & aerobic/resistive exercise along with prescribed medications to achieve LDL 70mg , HDL >40mg .   Expected Outcomes Short Term: Participant states understanding of desired cholesterol values and is compliant with medications prescribed. Participant  is following exercise prescription and nutrition guidelines.;Long Term: Cholesterol controlled with medications as prescribed, with individualized exercise RX and with personalized nutrition plan. Value goals: LDL < 70mg , HDL > 40 mg.   Stress Yes   Intervention Offer individual and/or small group education and counseling on adjustment to heart disease, stress management and health-related lifestyle change. Teach and support self-help strategies.;Refer participants experiencing significant psychosocial distress to appropriate mental health specialists for further evaluation and treatment. When possible, include family members and significant others in education/counseling sessions.   Expected Outcomes Short Term: Participant demonstrates changes in health-related behavior, relaxation and other stress management skills, ability to obtain effective social support, and  compliance with psychotropic medications if prescribed.;Long Term: Emotional wellbeing is indicated by absence of clinically significant psychosocial distress or social isolation.  Concerned out of work till Jan. Has 30 years in with [employer].       Core Components/Risk Factors/Patient Goals Review:      Goals and Risk Factor Review    Row Name 10/03/16 1010             Core Components/Risk Factors/Patient Goals Review   Personal Goals Review Weight Management/Obesity;Hypertension;Lipids       Review Speaking with Franchot today.  He has gained some weight since admission to program working toward his goal for weight gain. Blood pressure and cholesterol remain controlled with daily meds and his working on nutrition goals, along with the exercise program at [facility].  Edrees is not sure what weight he should be aiming for. So will ask RD to advise Russel about this goal.        Expected Outcomes Continue to work on nutrition goals and his exercise prescription to continue reaching toward his goals.           Core Components/Risk Factors/Patient Goals at Discharge (Final Review):      Goals and Risk Factor Review - 10/03/16 1010      Core Components/Risk Factors/Patient Goals Review   Personal Goals Review Weight Management/Obesity;Hypertension;Lipids   Review Speaking with Wes today.  He has gained some weight since admission to program working toward his goal for weight gain. Blood pressure and cholesterol remain controlled with daily meds and his working on nutrition goals, along with the exercise program at [facility].  Ovid is not sure what weight he should be aiming for. So will ask RD to advise Ezequias about this goal.    Expected Outcomes Continue to work on nutrition goals and his exercise prescription to continue reaching toward his goals.       ITP Comments:     ITP Comments    Row Name 09/01/16 1631 09/19/16 0822 09/20/16 0656 10/18/16 0555     ITP Comments ITP  started during Medical Review /Orientation appt after Cardiac Rehab informed consent signed.  Lou brought in his home blood pressure cuff.  The systolic number was comparable, but the diastolic was 30 mmHG higher than what we got.  He will continue to monitor at home. 30 day review completed for Medical Director physician review and signature. Continue ITP unless changes made by physician. 30 day review completed for review by Dr Emily Filbert.  Continue with ITP unless changes noted by Dr Sabra Heck.       Comments:

## 2016-10-19 DIAGNOSIS — I214 Non-ST elevation (NSTEMI) myocardial infarction: Secondary | ICD-10-CM

## 2016-10-19 DIAGNOSIS — Z48812 Encounter for surgical aftercare following surgery on the circulatory system: Secondary | ICD-10-CM | POA: Diagnosis not present

## 2016-10-19 DIAGNOSIS — Z951 Presence of aortocoronary bypass graft: Secondary | ICD-10-CM

## 2016-10-19 NOTE — Progress Notes (Signed)
Daily Session Note  Patient Details  Name: Nicholas Torres MRN: [id number] Date of Birth: 06-26-1961 Referring Provider:   Flowsheet Row Cardiac Rehab from 09/01/2016 in [facility] Cardiac and Pulmonary Rehab  Referring Provider  Neoma Laming MD      Encounter Date: 10/19/2016  Check In:     Session Check In - 10/19/16 0904      Check-In   Location [facility]-Cardiac & Pulmonary Rehab   Staff Present Alberteen Sam, MA, ACSM RCEP, Exercise Physiologist;Maryruth Apple Oletta Darter, BA, ACSM CEP, Exercise Physiologist;Other  Jena Gauss, RN   Supervising physician immediately available to respond to emergencies See telemetry face sheet for immediately available ER MD   Medication changes reported     No   Fall or balance concerns reported    No   Warm-up and Cool-down Performed on first and last piece of equipment   Resistance Training Performed Yes   VAD Patient? No     Pain Assessment   Currently in Pain? No/denies   Multiple Pain Sites No         Goals Met:  Proper associated with RPD/PD & O2 Sat Independence with exercise equipment Exercise tolerated well Strength training completed today  Goals Unmet:  Not Applicable  Comments: Pt able to follow exercise prescription today without complaint.  Will continue to monitor for progression.    Dr. Emily Filbert is Medical Director for [place] and [facility].

## 2016-10-24 ENCOUNTER — Encounter: Payer: BLUE CROSS/BLUE SHIELD | Admitting: Respiratory Therapy

## 2016-10-24 DIAGNOSIS — I214 Non-ST elevation (NSTEMI) myocardial infarction: Secondary | ICD-10-CM

## 2016-10-24 DIAGNOSIS — Z951 Presence of aortocoronary bypass graft: Secondary | ICD-10-CM

## 2016-10-24 DIAGNOSIS — Z48812 Encounter for surgical aftercare following surgery on the circulatory system: Secondary | ICD-10-CM | POA: Diagnosis not present

## 2016-10-24 NOTE — Progress Notes (Signed)
Daily Session Note  Patient Details  Name: Nicholas Torres MRN: [id number] Date of Birth: May 27, 1961 Referring Provider:   Flowsheet Row Cardiac Rehab from 09/01/2016 in [facility] Cardiac and Pulmonary Rehab  Referring Provider  Neoma Laming MD      Encounter Date: 10/24/2016  Check In:     Session Check In - 10/24/16 0835      Check-In   Location [facility]-Cardiac & Pulmonary Rehab   Staff Present Alberteen Sam, MA, ACSM RCEP, Exercise Physiologist;Susanne Bice, RN, BSN, CCRP;Laureen Owens Shark, BS, RRT, Respiratory Therapist   Supervising physician immediately available to respond to emergencies See telemetry face sheet for immediately available ER MD   Medication changes reported     No   Fall or balance concerns reported    No   Warm-up and Cool-down Performed on first and last piece of equipment   Resistance Training Performed Yes   VAD Patient? No     Pain Assessment   Currently in Pain? No/denies   Multiple Pain Sites No         Goals Met:  Proper associated with RPD/PD & O2 Sat Independence with exercise equipment Exercise tolerated well No report of cardiac concerns or symptoms Strength training completed today  Goals Unmet:  Not Applicable  Comments: Pt able to follow exercise prescription today without complaint.  Will continue to monitor for progression.   Dr. Emily Filbert is Medical Director for [place] and [facility].

## 2016-10-26 DIAGNOSIS — Z951 Presence of aortocoronary bypass graft: Secondary | ICD-10-CM

## 2016-10-26 DIAGNOSIS — Z48812 Encounter for surgical aftercare following surgery on the circulatory system: Secondary | ICD-10-CM | POA: Diagnosis not present

## 2016-10-26 DIAGNOSIS — I214 Non-ST elevation (NSTEMI) myocardial infarction: Secondary | ICD-10-CM

## 2016-10-26 NOTE — Progress Notes (Signed)
Daily Session Note  Patient Details  Name: Nicholas Torres MRN: [id number] Date of Birth: 08/05/1961 Referring Provider:   Flowsheet Row Cardiac Rehab from 09/01/2016 in [facility] Cardiac and Pulmonary Rehab  Referring Provider  Neoma Laming MD      Encounter Date: 10/26/2016  Check In:     Session Check In - 10/26/16 0830      Check-In   Location [facility]-Cardiac & Pulmonary Rehab   Staff Present Alberteen Sam, MA, ACSM RCEP, Exercise Physiologist;Amanda Oletta Darter, BA, ACSM CEP, Exercise Physiologist;Other  Jena Gauss RN   Supervising physician immediately available to respond to emergencies See telemetry face sheet for immediately available ER MD   Medication changes reported     No   Fall or balance concerns reported    No   Warm-up and Cool-down Performed on first and last piece of equipment   Resistance Training Performed Yes   VAD Patient? No     Pain Assessment   Currently in Pain? No/denies   Multiple Pain Sites No         Goals Met:  Independence with exercise equipment Exercise tolerated well Personal goals reviewed No report of cardiac concerns or symptoms Strength training completed today  Goals Unmet:  Not Applicable  Comments: Pt able to follow exercise prescription today without complaint.  Will continue to monitor for progression. See ITP for goal review   Dr. Emily Filbert is Medical Director for [place] and [facility].

## 2016-10-31 ENCOUNTER — Encounter: Payer: BLUE CROSS/BLUE SHIELD | Admitting: Respiratory Therapy

## 2016-10-31 DIAGNOSIS — Z951 Presence of aortocoronary bypass graft: Secondary | ICD-10-CM

## 2016-10-31 DIAGNOSIS — I214 Non-ST elevation (NSTEMI) myocardial infarction: Secondary | ICD-10-CM

## 2016-10-31 DIAGNOSIS — Z48812 Encounter for surgical aftercare following surgery on the circulatory system: Secondary | ICD-10-CM | POA: Diagnosis not present

## 2016-10-31 NOTE — Progress Notes (Signed)
Daily Session Note  Patient Details  Name: Nicholas Torres MRN: [id number] Date of Birth: Dec 21, 1960 Referring Provider:   Flowsheet Row Cardiac Rehab from 09/01/2016 in [facility] Cardiac and Pulmonary Rehab  Referring Provider  Neoma Laming MD      Encounter Date: 10/31/2016  Check In:     Session Check In - 10/31/16 0827      Check-In   Location [facility]-Cardiac & Pulmonary Rehab   Staff Present Alberteen Sam, MA, ACSM RCEP, Exercise Physiologist;Susanne Bice, RN, BSN, CCRP;Laureen Owens Shark, BS, RRT, Respiratory Therapist   Supervising physician immediately available to respond to emergencies See telemetry face sheet for immediately available ER MD   Medication changes reported     No   Fall or balance concerns reported    No   Warm-up and Cool-down Performed on first and last piece of equipment   Resistance Training Performed Yes   VAD Patient? No     Pain Assessment   Currently in Pain? No/denies   Multiple Pain Sites No         Goals Met:  Independence with exercise equipment Exercise tolerated well No report of cardiac concerns or symptoms Strength training completed today  Goals Unmet:  Not Applicable  Comments: Pt able to follow exercise prescription today without complaint.  Will continue to monitor for progression.    Dr. Emily Filbert is Medical Director for [place] and [facility].

## 2016-11-02 DIAGNOSIS — I214 Non-ST elevation (NSTEMI) myocardial infarction: Secondary | ICD-10-CM

## 2016-11-02 DIAGNOSIS — Z951 Presence of aortocoronary bypass graft: Secondary | ICD-10-CM

## 2016-11-02 DIAGNOSIS — Z48812 Encounter for surgical aftercare following surgery on the circulatory system: Secondary | ICD-10-CM | POA: Diagnosis not present

## 2016-11-02 NOTE — Progress Notes (Signed)
Daily Session Note  Patient Details  Name: Nicholas Torres MRN: [id number] Date of Birth: [birth year] Referring Provider:   Flowsheet Row Cardiac Rehab from 09/01/2016 in [facility] Cardiac and Pulmonary Rehab  Referring Provider  Neoma Laming MD      Encounter Date: 11/02/2016  Check In:     Session Check In - 11/02/16 0837      Check-In   Location [facility]-Cardiac & Pulmonary Rehab   Staff Present Alberteen Sam, MA, ACSM RCEP, Exercise Physiologist;Amanda Oletta Darter, BA, ACSM CEP, Exercise Physiologist;Patricia Surles RN BSN   Supervising physician immediately available to respond to emergencies See telemetry face sheet for immediately available ER MD   Medication changes reported     No   Fall or balance concerns reported    No   Warm-up and Cool-down Performed on first and last piece of equipment   Resistance Training Performed Yes   VAD Patient? No     Pain Assessment   Currently in Pain? No/denies   Multiple Pain Sites No         Goals Met:  Independence with exercise equipment Exercise tolerated well No report of cardiac concerns or symptoms Strength training completed today  Goals Unmet:  Not Applicable  Comments: Pt able to follow exercise prescription today without complaint.  Will continue to monitor for progression.    Dr. Emily Filbert is Medical Director for [place] and [facility].

## 2016-11-07 ENCOUNTER — Encounter: Payer: BLUE CROSS/BLUE SHIELD | Admitting: Respiratory Therapy

## 2016-11-07 DIAGNOSIS — Z951 Presence of aortocoronary bypass graft: Secondary | ICD-10-CM

## 2016-11-07 DIAGNOSIS — Z48812 Encounter for surgical aftercare following surgery on the circulatory system: Secondary | ICD-10-CM | POA: Diagnosis not present

## 2016-11-07 DIAGNOSIS — I214 Non-ST elevation (NSTEMI) myocardial infarction: Secondary | ICD-10-CM

## 2016-11-07 NOTE — Progress Notes (Signed)
Daily Session Note  Patient Details  Name: Simcha Speir MRN: [id number] Date of Birth: 06-18-1961 Referring Provider:   Flowsheet Row Cardiac Rehab from 09/01/2016 in [facility] Cardiac and Pulmonary Rehab  Referring Provider  Neoma Laming MD      Encounter Date: 11/07/2016  Check In:     Session Check In - 11/07/16 0937      Check-In   Location [facility]-Cardiac & Pulmonary Rehab   Staff Present Alberteen Sam, MA, ACSM RCEP, Exercise Physiologist;Susanne Bice, RN, BSN, CCRP;Laureen Owens Shark, BS, RRT, Respiratory Therapist   Supervising physician immediately available to respond to emergencies See telemetry face sheet for immediately available ER MD   Medication changes reported     No   Fall or balance concerns reported    No   Warm-up and Cool-down Performed on first and last piece of equipment   Resistance Training Performed Yes   VAD Patient? No     Pain Assessment   Currently in Pain? No/denies   Multiple Pain Sites No         Goals Met:  Proper associated with RPD/PD & O2 Sat Independence with exercise equipment Exercise tolerated well No report of cardiac concerns or symptoms Strength training completed today  Goals Unmet:  Not Applicable  Comments: Pt able to follow exercise prescription today without complaint.  Will continue to monitor for progression.   Dr. Emily Filbert is Medical Director for [place] and [facility].

## 2016-11-09 ENCOUNTER — Encounter: Payer: BLUE CROSS/BLUE SHIELD | Admitting: *Deleted

## 2016-11-09 VITALS — Ht 65.5 in | Wt 175.0 lb

## 2016-11-09 DIAGNOSIS — I214 Non-ST elevation (NSTEMI) myocardial infarction: Secondary | ICD-10-CM

## 2016-11-09 DIAGNOSIS — Z48812 Encounter for surgical aftercare following surgery on the circulatory system: Secondary | ICD-10-CM | POA: Diagnosis not present

## 2016-11-09 DIAGNOSIS — Z951 Presence of aortocoronary bypass graft: Secondary | ICD-10-CM

## 2016-11-09 NOTE — Progress Notes (Signed)
Daily Session Note  Patient Details  Name: Wayburn Shaler MRN: [id number] Date of Birth: May 29, 1961 Referring Provider:   Flowsheet Row Cardiac Rehab from 09/01/2016 in [facility] Cardiac and Pulmonary Rehab  Referring Provider  Neoma Laming MD      Encounter Date: 11/09/2016  Check In:     Session Check In - 11/09/16 1014      Check-In   Location [facility]-Cardiac & Pulmonary Rehab   Staff Present Levell July RN BSN;Nazli Penn Luan Pulling, MA, ACSM RCEP, Exercise Physiologist;Susanne Bice, RN, BSN, CCRP   Supervising physician immediately available to respond to emergencies See telemetry face sheet for immediately available ER MD   Medication changes reported     No   Fall or balance concerns reported    No   Warm-up and Cool-down Performed on first and last piece of equipment   Resistance Training Performed Yes   VAD Patient? No     Pain Assessment   Currently in Pain? No/denies   Multiple Pain Sites No         Goals Met:  Independence with exercise equipment Exercise tolerated well No report of cardiac concerns or symptoms Strength training completed today  Goals Unmet:  Not Applicable  Comments: Pt able to follow exercise prescription today without complaint.  Will continue to monitor for progression.    Dr. Emily Filbert is Medical Director for [place] and [facility].

## 2016-11-14 ENCOUNTER — Encounter: Payer: Self-pay | Admitting: *Deleted

## 2016-11-14 ENCOUNTER — Encounter: Payer: BLUE CROSS/BLUE SHIELD | Attending: Cardiovascular Disease | Admitting: Respiratory Therapy

## 2016-11-14 DIAGNOSIS — Z48812 Encounter for surgical aftercare following surgery on the circulatory system: Secondary | ICD-10-CM | POA: Insufficient documentation

## 2016-11-14 DIAGNOSIS — I214 Non-ST elevation (NSTEMI) myocardial infarction: Secondary | ICD-10-CM

## 2016-11-14 DIAGNOSIS — Z9889 Other specified postprocedural states: Secondary | ICD-10-CM | POA: Diagnosis not present

## 2016-11-14 DIAGNOSIS — I252 Old myocardial infarction: Secondary | ICD-10-CM | POA: Insufficient documentation

## 2016-11-14 DIAGNOSIS — Z951 Presence of aortocoronary bypass graft: Secondary | ICD-10-CM | POA: Diagnosis present

## 2016-11-14 NOTE — Progress Notes (Signed)
Daily Session Note  Patient Details  Name: Nam Vossler MRN: [id number] Date of Birth: [birth year] Referring Provider:   Flowsheet Row Cardiac Rehab from 09/01/2016 in [facility] Cardiac and Pulmonary Rehab  Referring Provider  Neoma Laming MD      Encounter Date: 11/14/2016  Check In:     Session Check In - 11/14/16 0905      Check-In   Location [facility]-Cardiac & Pulmonary Rehab   Staff Present Alberteen Sam, MA, ACSM RCEP, Exercise Physiologist;Susanne Bice, RN, BSN, CCRP;Laureen Owens Shark, BS, RRT, Respiratory Therapist   Supervising physician immediately available to respond to emergencies See telemetry face sheet for immediately available ER MD   Medication changes reported     No   Fall or balance concerns reported    No   Warm-up and Cool-down Performed on first and last piece of equipment   Resistance Training Performed Yes   VAD Patient? No     Pain Assessment   Currently in Pain? No/denies   Multiple Pain Sites No         Goals Met:  Proper associated with RPD/PD & O2 Sat Independence with exercise equipment Exercise tolerated well No report of cardiac concerns or symptoms Strength training completed today  Goals Unmet:  Not Applicable  Comments: Pt able to follow exercise prescription today without complaint.  Will continue to monitor for progression.   Dr. Emily Filbert is Medical Director for [place] and [facility].

## 2016-11-14 NOTE — Progress Notes (Signed)
Cardiac Individual Treatment Plan  Patient Details  Name: Nicholas Torres MRN: [id number] Date of Birth: 06-03-1961 Referring Provider:   Flowsheet Row Cardiac Rehab from 09/01/2016 in [facility] Cardiac and Pulmonary Rehab  Referring Provider  Neoma Laming MD      Initial Encounter Date:  Flowsheet Row Cardiac Rehab from 09/01/2016 in [facility] Cardiac and Pulmonary Rehab  Date  09/02/16  Referring Provider  Neoma Laming MD      Visit Diagnosis: NSTEMI (non-ST elevated myocardial infarction) ([place])  S/P CABG x 4  Patient's Home Medications on Admission:  Current Outpatient Prescriptions:  .  aspirin EC 325 MG EC tablet, Take 1 tablet (325 mg total) by mouth daily., Disp: 30 tablet, Rfl: 0 .  fluticasone (FLONASE) 50 MCG/ACT nasal spray, Place 1 spray into both nostrils at bedtime as needed for rhinitis., Disp: , Rfl:  .  metoprolol tartrate (LOPRESSOR) 25 MG tablet, 12.5 mg. , Disp: , Rfl:  .  rosuvastatin (CRESTOR) 10 MG tablet, Take 10 mg by mouth daily., Disp: , Rfl:   Past Medical History: Past Medical History:  Diagnosis Date  . CAD (coronary artery disease)   . High cholesterol   . Hypertension   . Pollen allergy     Tobacco Use: History  Smoking Status  . Never Smoker  Smokeless Tobacco  . Never Used    Labs: Recent Review Flowsheet Data    Labs for ITP Cardiac and Pulmonary Rehab Latest Ref Rng & Units 07/26/2016 07/26/2016 07/26/2016 07/27/2016 07/27/2016   Cholestrol 0 - 200 mg/dL - - - - -   LDLCALC 0 - 99 mg/dL - - - - -   HDL >40 mg/dL - - - - -   Trlycerides <150 mg/dL - - - - -   Hemoglobin A1c 4.8 - 5.6 % - - - - -   PHART 7.350 - 7.450 7.387 - 7.334(L) 7.330(L) -   PCO2ART 32.0 - 48.0 mmHg 36.8 - 40.7 42.3 -   HCO3 20.0 - 28.0 mmol/L 22.5 - 21.1 21.9 -   TCO2 0 - 100 mmol/L _0 ACIDBASEDEF 0.0 - 2.0 mmol/L 3.0(H) - 4.0(H) 3.0(H) -   O2SAT % 95.0 - 98.0 99.0 -       Exercise Target Goals:    Exercise Program Goal: Individual exercise  prescription set with THRR, safety & activity barriers. Participant demonstrates ability to understand and report RPE using BORG scale, to self-measure pulse accurately, and to acknowledge the importance of the exercise prescription.  Exercise Prescription Goal: Starting with aerobic activity 30 plus minutes a day, 3 days per week for initial exercise prescription. Provide home exercise prescription and guidelines that participant acknowledges understanding prior to discharge.  Activity Barriers & Risk Stratification:     Activity Barriers & Cardiac Risk Stratification - 09/01/16 1639      Activity Barriers & Cardiac Risk Stratification   Activity Barriers None      6 Minute Walk:     6 Minute Walk    Row Name 09/01/16 1636 11/09/16 1015       6 Minute Walk   Phase Initial Discharge    Distance 1410 feet 1702 feet    Distance % Change  - 20.7 %  292 ft    Walk Time 6 minutes 6 minutes    # of Rest Breaks 0 0    MPH 2.67 3.22    METS 4.15 4.72    RPE 11 11  Perceived Dyspnea  0 0    VO2 Peak 14.54 16.5    Symptoms No No    Resting HR 101 bpm 65 bpm    Resting BP 128/80 124/78    Max Ex. HR 123 bpm 130 bpm    Max Ex. BP 118/74 130/76    2 Minute Post BP 104/78  -       Initial Exercise Prescription:     Initial Exercise Prescription - 09/04/16 1300      Date of Initial Exercise RX and Referring Provider   Date 09/02/16   Referring Provider Neoma Laming MD     Treadmill   MPH 2.5   Grade 2   Minutes 15   METs 3.6     Recumbant Elliptical   Level 2   RPM 50   Minutes 15   METs 2     T5 Nustep   Level 3   Minutes 15   METs 2     Prescription Details   Frequency (times per week) 2   Duration Progress to 45 minutes of aerobic exercise without signs/symptoms of physical distress     Intensity   THRR 40-80% of Max Heartrate 127-154   Ratings of Perceived Exertion 11-15   Perceived Dyspnea 0-4     Progression   Progression Continue to progress  workloads to maintain intensity without signs/symptoms of physical distress.     Resistance Training   Training Prescription Yes   Weight 3 lbs   Reps 10-12      Perform Capillary Blood Glucose checks as needed.  Exercise Prescription Changes:     Exercise Prescription Changes    Row Name 09/01/16 1600 09/13/16 0900 09/26/16 1500 10/11/16 1500 10/24/16 1500     Exercise Review   Progression -  Walk test results Yes Yes Yes Yes     Response to Exercise   Blood Pressure (Admit) 128/80 132/70 126/80 120/82 114/70   Blood Pressure (Exercise) 118/74 134/72 124/70 148/78 168/78   Blood Pressure (Exit) 104/78 124/78 114/60 112/60 104/66   Heart Rate (Admit) 101 bpm 61 bpm 83 bpm 107 bpm 88 bpm   Heart Rate (Exercise) 123 bpm 132 bpm 137 bpm 156 bpm 158 bpm   Heart Rate (Exit) 94 bpm 82 bpm 90 bpm 101 bpm 101 bpm   Oxygen Saturation (Admit) 96 %  -  -  -  -   Oxygen Saturation (Exercise) 99 %  -  -  -  -   Rating of Perceived Exertion (Exercise) _0 Symptoms _1    Comments  -  - Home Exercise Guidelines given 09/12/16 Home Exercise Guidelines given 09/12/16 Home Exercise Guidelines given 09/12/16   Duration  - Progress to 45 minutes of aerobic exercise without signs/symptoms of physical distress Progress to 45 minutes of aerobic exercise without signs/symptoms of physical distress Progress to 45 minutes of aerobic exercise without signs/symptoms of physical distress Progress to 45 minutes of aerobic exercise without signs/symptoms of physical distress   Intensity  - THRR unchanged THRR unchanged THRR unchanged THRR unchanged     Progression   Progression  - Continue to progress workloads to maintain intensity without signs/symptoms of physical distress. Continue to progress workloads to maintain intensity without signs/symptoms of physical distress. Continue to progress workloads to maintain intensity without signs/symptoms of physical distress. Continue  to progress workloads to maintain intensity without signs/symptoms of physical distress.   Average METs  -  2.84 3.52 6.35 4.65     Resistance Training   Training Prescription  - Yes Yes Yes Yes   Weight  - 5 lbs 5 lbs 5 lbs 5 lbs   Reps  - 10-12 10-12 10-12 10-12     Interval Training   Interval Training  - No No Yes Yes   Equipment  -  -  - T5 Nustep T5 Nustep   Comments  -  -  - 2 min off  30 sec on 2 min off  30 sec on     Treadmill   MPH  - 3 3.5 3.5 3.8   Grade  - _0 Minutes  - _1 METs  - 4.12 4.65 4.65 4.96     Recumbant Elliptical   Level  - _2 RPM  - 50 50 50 50   Minutes  - _3 METs  - 2.1 3.2 4.8 5     T5 Nustep   Level  - _4 Minutes  - _5 METs  - 2.3 2.7 9.6 4     Home Exercise Plan   Plans to continue exercise at  -  - Home  gym in complex (treadmill, weights) and walking Home  gym in complex (treadmill, weights) and walking Home  gym in complex (treadmill, weights) and walking   Frequency  -  - Add 3 additional days to program exercise sessions. Add 3 additional days to program exercise sessions. Add 3 additional days to program exercise sessions.      Exercise Comments:     Exercise Comments    Row Name 09/07/16 0940 09/13/16 0935 09/26/16 1547 10/11/16 1504 10/24/16 1548   Exercise Comments First full day of exercise!  Patient was oriented to gym and equipment including functions, settings, policies, and procedures.  Patient's individual exercise prescription and treatment plan were reviewed.  All starting workloads were established based on the results of the 6 minute walk test done at initial orientation visit.  The plan for exercise progression was also introduced and progression will be customized based on patient's performance and goals. Nicholas Torres is off to a great start with exercise.  We will continue to work with him on progression. Nicholas Torres is doing well in rehab.  He is asking good questions in  education classes and about his exercise routine.  We will continue to monitor his progression. Nicholas Torres continues to do well in rehab.  He is starting to move up his workloads more.  We will continue to monitor his progression. Nicholas Torres has been doing well with rehab.  He continues to make improvements.  We will watch his progress.   Nicholas Torres Name 11/09/16 1016           Exercise Comments Nicholas Torres improved his walk test by over 20%!          Discharge Exercise Prescription (Final Exercise Prescription Changes):     Exercise Prescription Changes - 10/24/16 1500      Exercise Review   Progression Yes     Response to Exercise   Blood Pressure (Admit) 114/70   Blood Pressure (Exercise) 168/78   Blood Pressure (Exit) 104/66   Heart Rate (Admit) 88 bpm   Heart Rate (Exercise) 158 bpm   Heart Rate (Exit) 101 bpm   Rating of Perceived Exertion (Exercise) 13   Symptoms  none   Comments Home Exercise Guidelines given 09/12/16   Duration Progress to 45 minutes of aerobic exercise without signs/symptoms of physical distress   Intensity THRR unchanged     Progression   Progression Continue to progress workloads to maintain intensity without signs/symptoms of physical distress.   Average METs 4.65     Resistance Training   Training Prescription Yes   Weight 5 lbs   Reps 10-12     Interval Training   Interval Training Yes   Equipment T5 Nustep   Comments 2 min off  30 sec on     Treadmill   MPH 3.8   Grade 2   Minutes 15   METs 4.96     Recumbant Elliptical   Level 5   RPM 50   Minutes 15   METs 5     T5 Nustep   Level 4   Minutes 15   METs 4     Home Exercise Plan   Plans to continue exercise at Home  gym in complex (treadmill, weights) and walking   Frequency Add 3 additional days to program exercise sessions.      Nutrition:  Target Goals: Understanding of nutrition guidelines, daily intake of sodium <1563m, cholesterol <2039m calories 30% from fat and 7% or less  from saturated fats, daily to have 5 or more servings of fruits and vegetables.  Biometrics:     Pre Biometrics - 09/01/16 1638      Pre Biometrics   Height 5' 5.5" (1.664 m)   Weight 167 lb (75.8 kg)   Waist Circumference 35 inches   Hip Circumference 38.25 inches   Waist to Hip Ratio 0.92 %   BMI (Calculated) 27.4   Single Leg Stand 30 seconds         Post Biometrics - 11/09/16 1119       Post  Biometrics   Height 5' 5.5" (1.664 m)   Weight 175 lb (79.4 kg)   Waist Circumference 33.5 inches   Hip Circumference 38 inches   Waist to Hip Ratio 0.88 %   BMI (Calculated) 28.7   Single Leg Stand 30 seconds      Nutrition Therapy Plan and Nutrition Goals:     Nutrition Therapy & Goals - 10/03/16 0940      Personal Nutrition Goals   Personal Goal #2 To try Mueller's brand of whole grain pasta counting 1 cup of cooked pasta as 2 servings of whole grain. Also, use brown rice and oatmeal to boost whole grain intake.      Nutrition Discharge: Rate Your Plate Scores:     Nutrition Assessments - 10/31/16 1111      Rate Your Plate Scores   Pre Score 69   Pre Score % 90 %   Post Score 74   Post Score % 82.2 %   % Change -7.8 %      Nutrition Goals Re-Evaluation:     Nutrition Goals Re-Evaluation    Row Name 10/03/16 0941 10/03/16 0958 10/26/16 0958         Personal Goal #1 Re-Evaluation   Personal Goal #1 To try margarines with no trans fat. Refer to examples given. To try margarines with no trans fat. Refer to examples given. To try margarines with no trans fat. Refer to examples given.     Goal Progress Seen No  - No     Comments Not shopped for this yet  Expected outcome: Will look for this type  of butter when he needs to replenish butter at home. Not shopped for this yet  Expected outcome: Will look for this type of butter when he needs to replenish butter at home. He has not bought this yet, but will try to get the Smart Balance butter.       Personal Goal  #2 Re-Evaluation   Personal Goal #2 To try Mueller's brand of whole grain pasta counting 1 cup of cooked pasta as 2 servings of whole grain. Also, use brown rice and oatmeal to boost whole grain intake. To try Mueller's brand of whole grain pasta counting 1 cup of cooked pasta as 2 servings of whole grain. Also, use brown rice and oatmeal to boost whole grain intake. To try Mueller's brand of whole grain pasta counting 1 cup of cooked pasta as 2 servings of whole grain. Also, use brown rice and oatmeal to boost whole grain intake.     Goal Progress Seen Yes  - Met     Comments Is eating brown rice already.  Will try whole wheat pasta.  Expected outcome:  Is eating brown rice already.  Will try whole wheat pasta.  Expected outcome:  Comtinue to shop for the whole grain pasta and rice Has switched to whole grains, but still has a few white grains at home.       Personal Goal #3 Re-Evaluation   Personal Goal #3 Use Mrs. DASH seasonings Use Mrs. DASH seasonings Use Mrs. DASH seasonings     Goal Progress Seen  - Yes Met     Comments  - HAs already made changes in spice use. Expected outcome: Continued use of spices without added sodium Really enjoying the chicken seasoning and lemon pepper.       Personal Goal #4 Re-Evaluation   Personal Goal #4 Look up nutrition information especially for restaurants using calorieking or myfitness pal app Look up nutrition information especially for restaurants using calorieking or myfitness pal app Look up nutrition information especially for restaurants using calorieking or myfitness pal app     Goal Progress Seen  - Yes Met     Comments  - Is starting to read the nutrition ;abels at restaurants.    Expected outcome: Continue to read the nutrition labels Using CalorieKing anytime he is eating out.        Psychosocial: Target Goals: Acknowledge presence or absence of depression, maximize coping skills, provide positive support system. Participant is able to verbalize  types and ability to use techniques and skills needed for reducing stress and depression.  Initial Review & Psychosocial Screening:     Initial Psych Review & Screening - 09/01/16 1631      Initial Review   Current issues with Current Stress Concerns     Family Dynamics   Good Support System? Yes   Comments Concerned out of work till Jan. Has 30 years in with [employer].     Barriers   Psychosocial barriers to participate in program The patient should benefit from training in stress management and relaxation.     Screening Interventions   Interventions Encouraged to exercise      Quality of Life Scores:     Quality of Life - 10/31/16 1112      Quality of Life Scores   Health/Function Post 24 %   Socioeconomic Post 26.25 %   Psych/Spiritual Post 24 %   Family Post 27.6 %   GLOBAL Post 25.03 %      PHQ-9: Recent Review  Flowsheet Data    Depression screen [facility] 2/9 10/31/2016 09/01/2016   Decreased Interest 0 0   Down, Depressed, Hopeless 0 0   PHQ - 2 Score 0 0   Altered sleeping 0 2   Tired, decreased energy 0 2   Change in appetite 0 1   Feeling bad or failure about yourself  0 1   Trouble concentrating 0 0   Moving slowly or fidgety/restless 0 0   Suicidal thoughts 0 0   PHQ-9 Score 0 6   Difficult doing work/chores Not difficult at all Not difficult at all      Psychosocial Evaluation and Intervention:   Psychosocial Re-Evaluation:     Psychosocial Re-Evaluation    [place] Name 10/26/16 1000             Psychosocial Re-Evaluation   Interventions Encouraged to attend Cardiac Rehabilitation for the exercise;Stress management education       Comments Nicholas Torres is still having some stress due to not being able to go back to work.  He is worried about being able to lift and manauever like he should at work.  He is not scheduled to return until mid Feburary which should help with this continued increase in stress.  He is sleeping good on couch and in bed using an  incline pillow..          Vocational Rehabilitation: Provide vocational rehab assistance to qualifying candidates.   Vocational Rehab Evaluation & Intervention:     Vocational Rehab - 09/01/16 1612      Initial Vocational Rehab Evaluation & Intervention   Assessment shows need for Vocational Rehabilitation No      Education: Education Goals: Education classes will be provided on a weekly basis, covering required topics. Participant will state understanding/return demonstration of topics presented.  Learning Barriers/Preferences:     Learning Barriers/Preferences - 09/01/16 1639      Learning Barriers/Preferences   Learning Barriers None   Learning Preferences None      Education Topics: General Nutrition Guidelines/Fats and Fiber: -Group instruction provided by verbal, written material, models and posters to present the general guidelines for heart healthy nutrition. Gives an explanation and review of dietary fats and fiber. Flowsheet Row Cardiac Rehab from 11/02/2016 in [facility] Cardiac and Pulmonary Rehab  Date  09/26/16  Educator  SB  Instruction Review Code  2- meets goals/outcomes      Controlling Sodium/Reading Food Labels: -Group verbal and written material supporting the discussion of sodium use in heart healthy nutrition. Review and explanation with models, verbal and written materials for utilization of the food label. Flowsheet Row Cardiac Rehab from 11/02/2016 in [facility] Cardiac and Pulmonary Rehab  Date  10/03/16  Educator  PI  Instruction Review Code  2- meets goals/outcomes      Exercise Physiology & Risk Factors: - Group verbal and written instruction with models to review the exercise physiology of the cardiovascular system and associated critical values. Details cardiovascular disease risk factors and the goals associated with each risk factor. Flowsheet Row Cardiac Rehab from 11/02/2016 in [facility] Cardiac and Pulmonary Rehab  Date  09/21/16  Educator  [facility]   Instruction Review Code  2- meets goals/outcomes      Aerobic Exercise & Resistance Training: - Gives group verbal and written discussion on the health impact of inactivity. On the components of aerobic and resistive training programs and the benefits of this training and how to safely progress through these programs. Flowsheet Row Cardiac Rehab from 11/02/2016  in [facility] Cardiac and Pulmonary Rehab  Date  10/10/16  Educator  [facility]  Instruction Review Code  2- meets goals/outcomes      Flexibility, Balance, General Exercise Guidelines: - Provides group verbal and written instruction on the benefits of flexibility and balance training programs. Provides general exercise guidelines with specific guidelines to those with heart or lung disease. Demonstration and skill practice provided.   Stress Management: - Provides group verbal and written instruction about the health risks of elevated stress, cause of high stress, and healthy ways to reduce stress. Flowsheet Row Cardiac Rehab from 11/02/2016 in [facility] Cardiac and Pulmonary Rehab  Date  10/26/16  Educator  TS  Instruction Review Code  2- meets goals/outcomes      Depression: - Provides group verbal and written instruction on the correlation between heart/lung disease and depressed mood, treatment options, and the stigmas associated with seeking treatment. Flowsheet Row Cardiac Rehab from 11/02/2016 in [facility] Cardiac and Pulmonary Rehab  Date  09/28/16  Educator  CE  Instruction Review Code  2- meets goals/outcomes      Anatomy & Physiology of the Heart: - Group verbal and written instruction and models provide basic cardiac anatomy and physiology, with the coronary electrical and arterial systems. Review of: AMI, Angina, Valve disease, Heart Failure, Cardiac Arrhythmia, Pacemakers, and the ICD. Flowsheet Row Cardiac Rehab from 11/02/2016 in [facility] Cardiac and Pulmonary Rehab  Date  10/17/16  Educator  SB  Instruction Review Code  2-  meets goals/outcomes      Cardiac Procedures: - Group verbal and written instruction and models to describe the testing methods done to diagnose heart disease. Reviews the outcomes of the test results. Describes the treatment choices: Medical Management, Angioplasty, or Coronary Bypass Surgery. Flowsheet Row Cardiac Rehab from 11/02/2016 in [facility] Cardiac and Pulmonary Rehab  Date  10/24/16  Educator  SB  Instruction Review Code  2- meets goals/outcomes      Cardiac Medications: - Group verbal and written instruction to review commonly prescribed medications for heart disease. Reviews the medication, class of the drug, and side effects. Includes the steps to properly store meds and maintain the prescription regimen. Flowsheet Row Cardiac Rehab from 11/02/2016 in [facility] Cardiac and Pulmonary Rehab  Date  11/02/16 [10/31 Part 2 SB]  Educator  AS  Instruction Review Code  2- meets goals/outcomes      Go Sex-Intimacy & Heart Disease, Get SMART - Goal Setting: - Group verbal and written instruction through game format to discuss heart disease and the return to sexual intimacy. Provides group verbal and written material to discuss and apply goal setting through the application of the S.M.A.R.T. Method. Flowsheet Row Cardiac Rehab from 11/02/2016 in [facility] Cardiac and Pulmonary Rehab  Date  10/24/16  Educator  SB  Instruction Review Code  2- meets goals/outcomes      Other Matters of the Heart: - Provides group verbal, written materials and models to describe Heart Failure, Angina, Valve Disease, and Diabetes in the realm of heart disease. Includes description of the disease process and treatment options available to the cardiac patient.   Exercise & Equipment Safety: - Individual verbal instruction and demonstration of equipment use and safety with use of the equipment. Flowsheet Row Cardiac Rehab from 11/02/2016 in [facility] Cardiac and Pulmonary Rehab  Date  09/01/16  Educator  C. Dasher   Instruction Review Code  2- meets goals/outcomes      Infection Prevention: - Provides verbal and written material to individual with discussion  of infection control including proper hand washing and proper equipment cleaning during exercise session. Flowsheet Row Cardiac Rehab from 11/02/2016 in [facility] Cardiac and Pulmonary Rehab  Date  09/01/16  Educator  C. EnterkinRN  Instruction Review Code  2- meets goals/outcomes      Falls Prevention: - Provides verbal and written material to individual with discussion of falls prevention and safety. Flowsheet Row Cardiac Rehab from 11/02/2016 in [facility] Cardiac and Pulmonary Rehab  Date  09/01/16  Educator  C. [place]  Instruction Review Code  2- meets goals/outcomes      Diabetes: - Individual verbal and written instruction to review signs/symptoms of diabetes, desired ranges of glucose level fasting, after meals and with exercise. Advice that pre and post exercise glucose checks will be done for 3 sessions at entry of program.    Knowledge Questionnaire Score:     Knowledge Questionnaire Score - 10/31/16 1111      Knowledge Questionnaire Score   Pre Score 21/28   Post Score 23/28      Core Components/Risk Factors/Patient Goals at Admission:     Personal Goals and Risk Factors at Admission - 09/01/16 1630      Core Components/Risk Factors/Patient Goals on Admission    Weight Management Yes;Weight Maintenance   Intervention Weight Management: Develop a combined nutrition and exercise program designed to reach desired caloric intake, while maintaining appropriate intake of nutrient and fiber, sodium and fats, and appropriate energy expenditure required for the weight goal.;Weight Management: Provide education and appropriate resources to help participant work on and attain dietary goals.   Admit Weight 167 lb (75.8 kg)   Expected Outcomes Weight Maintenance: Understanding of the daily nutrition guidelines, which includes 25-35%  calories from fat, 7% or less cal from saturated fats, less than 219m cholesterol, less than 1.5gm of sodium, & 5 or more servings of fruits and vegetables daily   Sedentary Yes   Intervention Provide advice, education, support and counseling about physical activity/exercise needs.;Develop an individualized exercise prescription for aerobic and resistive training based on initial evaluation findings, risk stratification, comorbidities and participant's personal goals.   Expected Outcomes Achievement of increased cardiorespiratory fitness and enhanced flexibility, muscular endurance and strength shown through measurements of functional capacity and personal statement of participant.   Increase Strength and Stamina Yes   Intervention Provide advice, education, support and counseling about physical activity/exercise needs.;Develop an individualized exercise prescription for aerobic and resistive training based on initial evaluation findings, risk stratification, comorbidities and participant's personal goals.   Expected Outcomes Achievement of increased cardiorespiratory fitness and enhanced flexibility, muscular endurance and strength shown through measurements of functional capacity and personal statement of participant.   Hypertension Yes   Intervention Provide education on lifestyle modifcations including regular physical activity/exercise, weight management, moderate sodium restriction and increased consumption of fresh fruit, vegetables, and low fat dairy, alcohol moderation, and smoking cessation.;Monitor prescription use compliance.   Expected Outcomes Short Term: Continued assessment and intervention until BP is < 140/922mHG in hypertensive participants. < 130/8043mG in hypertensive participants with diabetes, heart failure or chronic kidney disease.;Long Term: Maintenance of blood pressure at goal levels.   Lipids Yes   Intervention Provide education and support for participant on nutrition &  aerobic/resistive exercise along with prescribed medications to achieve LDL <47m75mDL >40mg54mExpected Outcomes Short Term: Participant states understanding of desired cholesterol values and is compliant with medications prescribed. Participant is following exercise prescription and nutrition guidelines.;Long Term: Cholesterol controlled with medications as prescribed,  with individualized exercise RX and with personalized nutrition plan. Value goals: LDL < 66m, HDL > 40 mg.   Stress Yes   Intervention Offer individual and/or small group education and counseling on adjustment to heart disease, stress management and health-related lifestyle change. Teach and support self-help strategies.;Refer participants experiencing significant psychosocial distress to appropriate mental health specialists for further evaluation and treatment. When possible, include family members and significant others in education/counseling sessions.   Expected Outcomes Short Term: Participant demonstrates changes in health-related behavior, relaxation and other stress management skills, ability to obtain effective social support, and compliance with psychotropic medications if prescribed.;Long Term: Emotional wellbeing is indicated by absence of clinically significant psychosocial distress or social isolation.  Concerned out of work till Jan. Has 30 years in with [employer].       Core Components/Risk Factors/Patient Goals Review:      Goals and Risk Factor Review    Row Name 10/03/16 1010 10/26/16 0955           Core Components/Risk Factors/Patient Goals Review   Personal Goals Review Weight Management/Obesity;Hypertension;Lipids Weight Management/Obesity;Sedentary;Increase Strength and Stamina;Hypertension;Lipids;Stress      Review Speaking with Nicholas Torres today.  He has gained some weight since admission to program working toward his goal for weight gain. Blood pressure and cholesterol remain controlled with daily meds and his  working on nutrition goals, along [facility]  Nicholas Torres is not sure what weight he should be aiming for. So will ask RD to advise Nicholas Torres about this goal.  Nicholas Torres's weight has creeped back up to 175 lbs.  He is feeling stronger and regaining his muscle tone.  He is still experiencing some incisional tenderness and itching.  He is going to try some coaco butter to try to relieve this. He is watching his posture and sleeping on an incline pillow to help.  His blood pressures have been good and no problems with the statins.  He is walking at home on his off days.        Expected Outcomes Continue to work on nutrition goals and his exercise prescription to continue reaching toward his goals.  Nicholas Torres will conintue to work on his strength and risk factors.         Core Components/Risk Factors/Patient Goals at Discharge (Final Review):      Goals and Risk Factor Review - 10/26/16 0955      Core Components/Risk Factors/Patient Goals Review   Personal Goals Review Weight Management/Obesity;Sedentary;Increase Strength and Stamina;Hypertension;Lipids;Stress   Review Nicholas Torres's weight has creeped back up to 175 lbs.  He is feeling stronger and regaining his muscle tone.  He is still experiencing some incisional tenderness and itching.  He is going to try some coaco butter to try to relieve this. He is watching his posture and sleeping on an incline pillow to help.  His blood pressures have been good and no problems with the statins.  He is walking at home on his off days.     Expected Outcomes Nicholas Torres will conintue to work on his strength and risk factors.      ITP Comments:     ITP Comments    Row Name 09/01/16 1631 09/19/16 0822 09/20/16 0656 10/18/16 0555 11/14/16 0615   ITP Comments ITP started during Medical Review /Orientation appt after Cardiac Rehab informed consent signed.  Nicholas Torres brought in his home blood pressure cuff.  The systolic number was comparable, but the  diastolic was 30 mmHG higher than what we got.  He will continue to monitor at home. 30 day review completed for Medical Director physician review and signature. Continue ITP unless changes made by physician. 30 day review completed for review by Dr Emily Filbert.  Continue with ITP unless changes noted by Dr Sabra Heck. 30 day review. Continue with ITP unless directed changes per Medical Director review.      Comments:

## 2016-11-16 DIAGNOSIS — Z951 Presence of aortocoronary bypass graft: Secondary | ICD-10-CM

## 2016-11-16 DIAGNOSIS — Z48812 Encounter for surgical aftercare following surgery on the circulatory system: Secondary | ICD-10-CM | POA: Diagnosis not present

## 2016-11-16 DIAGNOSIS — I214 Non-ST elevation (NSTEMI) myocardial infarction: Secondary | ICD-10-CM

## 2016-11-16 NOTE — Patient Instructions (Signed)
Discharge Instructions  Patient Details  Name: Nicholas Torres MRN: [id number] Date of Birth: Nov 22, 1960 Referring Provider:  Dionisio David, MD   Number of Visits: 28  Reason for Discharge:  Patient reached a stable level of exercise. Patient independent in their exercise.  Smoking History:  History  Smoking Status  . Never Smoker  Smokeless Tobacco  . Never Used    Diagnosis:  NSTEMI (non-ST elevated myocardial infarction) ([place])  S/P CABG x 4  Initial Exercise Prescription:     Initial Exercise Prescription - 09/04/16 1300      Date of Initial Exercise RX and Referring Provider   Date 09/02/16   Referring Provider Neoma Laming MD     Treadmill   MPH 2.5   Grade 2   Minutes 15   METs 3.6     Recumbant Elliptical   Level 2   RPM 50   Minutes 15   METs 2     T5 Nustep   Level 3   Minutes 15   METs 2     Prescription Details   Frequency (times per week) 2   Duration Progress to 45 minutes of aerobic exercise without signs/symptoms of physical distress     Intensity   THRR 40-80% of Max Heartrate 127-154   Ratings of Perceived Exertion 11-15   Perceived Dyspnea 0-4     Progression   Progression Continue to progress workloads to maintain intensity without signs/symptoms of physical distress.     Resistance Training   Training Prescription Yes   Weight 3 lbs   Reps 10-12      Discharge Exercise Prescription (Final Exercise Prescription Changes):     Exercise Prescription Changes - 10/24/16 1500      Exercise Review   Progression Yes     Response to Exercise   Blood Pressure (Admit) 114/70   Blood Pressure (Exercise) 168/78   Blood Pressure (Exit) 104/66   Heart Rate (Admit) 88 bpm   Heart Rate (Exercise) 158 bpm   Heart Rate (Exit) 101 bpm   Rating of Perceived Exertion (Exercise) 13   Symptoms none   Comments Home Exercise Guidelines given 09/12/16   Duration Progress to 45 minutes of aerobic exercise without signs/symptoms of  physical distress   Intensity THRR unchanged     Progression   Progression Continue to progress workloads to maintain intensity without signs/symptoms of physical distress.   Average METs 4.65     Resistance Training   Training Prescription Yes   Weight 5 lbs   Reps 10-12     Interval Training   Interval Training Yes   Equipment T5 Nustep   Comments 2 min off  30 sec on     Treadmill   MPH 3.8   Grade 2   Minutes 15   METs 4.96     Recumbant Elliptical   Level 5   RPM 50   Minutes 15   METs 5     T5 Nustep   Level 4   Minutes 15   METs 4     Home Exercise Plan   Plans to continue exercise at Home  gym in complex (treadmill, weights) and walking   Frequency Add 3 additional days to program exercise sessions.      Functional Capacity:     6 Minute Walk    Row Name 09/01/16 1636 11/09/16 1015       6 Minute Walk   Phase Initial Discharge    Distance 1410  feet 1702 feet    Distance % Change  - 20.7 %  292 ft    Walk Time 6 minutes 6 minutes    # of Rest Breaks 0 0    MPH 2.67 3.22    METS 4.15 4.72    RPE 11 11    Perceived Dyspnea  0 0    VO2 Peak 14.54 16.5    Symptoms No No    Resting HR 101 bpm 65 bpm    Resting BP 128/80 124/78    Max Ex. HR 123 bpm 130 bpm    Max Ex. BP 118/74 130/76    2 Minute Post BP 104/78  -       Quality of Life:     Quality of Life - 10/31/16 1112      Quality of Life Scores   Health/Function Post 24 %   Socioeconomic Post 26.25 %   Psych/Spiritual Post 24 %   Family Post 27.6 %   GLOBAL Post 25.03 %      Personal Goals: Goals established at orientation with interventions provided to work toward goal.     Personal Goals and Risk Factors at Admission - 09/01/16 1630      Core Components/Risk Factors/Patient Goals on Admission    Weight Management Yes;Weight Maintenance   Intervention Weight Management: Develop a combined nutrition and exercise program designed to reach desired caloric intake, while  maintaining appropriate intake of nutrient and fiber, sodium and fats, and appropriate energy expenditure required for the weight goal.;Weight Management: Provide education and appropriate resources to help participant work on and attain dietary goals.   Admit Weight 167 lb (75.8 kg)   Expected Outcomes Weight Maintenance: Understanding of the daily nutrition guidelines, which includes 25-35% calories from fat, 7% or less cal from saturated fats, less than 200mg  cholesterol, less than 1.5gm of sodium, & 5 or more servings of fruits and vegetables daily   Sedentary Yes   Intervention Provide advice, education, support and counseling about physical activity/exercise needs.;Develop an individualized exercise prescription for aerobic and resistive training based on initial evaluation findings, risk stratification, comorbidities and participant's personal goals.   Expected Outcomes Achievement of increased cardiorespiratory fitness and enhanced flexibility, muscular endurance and strength shown through measurements of functional capacity and personal statement of participant.   Increase Strength and Stamina Yes   Intervention Provide advice, education, support and counseling about physical activity/exercise needs.;Develop an individualized exercise prescription for aerobic and resistive training based on initial evaluation findings, risk stratification, comorbidities and participant's personal goals.   Expected Outcomes Achievement of increased cardiorespiratory fitness and enhanced flexibility, muscular endurance and strength shown through measurements of functional capacity and personal statement of participant.   Hypertension Yes   Intervention Provide education on lifestyle modifcations including regular physical activity/exercise, weight management, moderate sodium restriction and increased consumption of fresh fruit, vegetables, and low fat dairy, alcohol moderation, and smoking cessation.;Monitor  prescription use compliance.   Expected Outcomes Short Term: Continued assessment and intervention until BP is < 140/34mm HG in hypertensive participants. < 130/28mm HG in hypertensive participants with diabetes, heart failure or chronic kidney disease.;Long Term: Maintenance of blood pressure at goal levels.   Lipids Yes   Intervention Provide education and support for participant on nutrition & aerobic/resistive exercise along with prescribed medications to achieve LDL 70mg , HDL >40mg .   Expected Outcomes Short Term: Participant states understanding of desired cholesterol values and is compliant with medications prescribed. Participant is following exercise prescription and nutrition  guidelines.;Long Term: Cholesterol controlled with medications as prescribed, with individualized exercise RX and with personalized nutrition plan. Value goals: LDL < 70mg , HDL > 40 mg.   Stress Yes   Intervention Offer individual and/or small group education and counseling on adjustment to heart disease, stress management and health-related lifestyle change. Teach and support self-help strategies.;Refer participants experiencing significant psychosocial distress to appropriate mental health specialists for further evaluation and treatment. When possible, include family members and significant others in education/counseling sessions.   Expected Outcomes Short Term: Participant demonstrates changes in health-related behavior, relaxation and other stress management skills, ability to obtain effective social support, and compliance with psychotropic medications if prescribed.;Long Term: Emotional wellbeing is indicated by absence of clinically significant psychosocial distress or social isolation.  Concerned out of work till Jan. Has 30 years in with [employer].        Personal Goals Discharge:     Goals and Risk Factor Review - 10/26/16 0955      Core Components/Risk Factors/Patient Goals Review   Personal Goals Review  Weight Management/Obesity;Sedentary;Increase Strength and Stamina;Hypertension;Lipids;Stress   Review Nicholas Torres's weight has creeped back up to 175 lbs.  He is feeling stronger and regaining his muscle tone.  He is still experiencing some incisional tenderness and itching.  He is going to try some coaco butter to try to relieve this. He is watching his posture and sleeping on an incline pillow to help.  His blood pressures have been good and no problems with the statins.  He is walking at home on his off days.     Expected Outcomes Nicholas Torres will conintue to work on his strength and risk factors.      Nutrition & Weight - Outcomes:     Pre Biometrics - 09/01/16 1638      Pre Biometrics   Height 5' 5.5" (1.664 m)   Weight 167 lb (75.8 kg)   Waist Circumference 35 inches   Hip Circumference 38.25 inches   Waist to Hip Ratio 0.92 %   BMI (Calculated) 27.4   Single Leg Stand 30 seconds         Post Biometrics - 11/09/16 1119       Post  Biometrics   Height 5' 5.5" (1.664 m)   Weight 175 lb (79.4 kg)   Waist Circumference 33.5 inches   Hip Circumference 38 inches   Waist to Hip Ratio 0.88 %   BMI (Calculated) 28.7   Single Leg Stand 30 seconds      Nutrition:     Nutrition Therapy & Goals - 10/03/16 0940      Personal Nutrition Goals   Personal Goal #2 To try [employer] brand of whole grain pasta counting 1 cup of cooked pasta as 2 servings of whole grain. Also, use brown rice and oatmeal to boost whole grain intake.      Nutrition Discharge:     Nutrition Assessments - 10/31/16 1111      Rate Your Plate Scores   Pre Score 69   Pre Score % 90 %   Post Score 74   Post Score % 82.2 %   % Change -7.8 %      Education Questionnaire Score:     Knowledge Questionnaire Score - 10/31/16 1111      Knowledge Questionnaire Score   Pre Score 21/28   Post Score 23/28      Goals reviewed with patient; copy given to patient.

## 2016-11-16 NOTE — Progress Notes (Signed)
Daily Session Note  Patient Details  Name: Raciel Caffrey MRN: [id number] Date of Birth: [birth year] Referring Provider:   Flowsheet Row Cardiac Rehab from 09/01/2016 in [facility] Cardiac and Pulmonary Rehab  Referring Provider  Neoma Laming MD      Encounter Date: 11/16/2016  Check In:     Session Check In - 11/16/16 1129      Check-In   Location [facility]-Cardiac & Pulmonary Rehab   Staff Present Alberteen Sam, MA, ACSM RCEP, Exercise Physiologist;Patricia Surles RN Vickki Hearing, BA, ACSM CEP, Exercise Physiologist   Supervising physician immediately available to respond to emergencies See telemetry face sheet for immediately available ER MD   Medication changes reported     No   Fall or balance concerns reported    No   Warm-up and Cool-down Performed on first and last piece of equipment   Resistance Training Performed Yes   VAD Patient? No     Pain Assessment   Currently in Pain? No/denies   Multiple Pain Sites No         Goals Met:  Independence with exercise equipment Exercise tolerated well No report of cardiac concerns or symptoms Strength training completed today  Goals Unmet:  Not Applicable  Comments: Pt able to follow exercise prescription today without complaint.  Will continue to monitor for progression.    Dr. Emily Filbert is Medical Director for [place] and [facility].

## 2016-11-21 ENCOUNTER — Encounter: Payer: BLUE CROSS/BLUE SHIELD | Admitting: *Deleted

## 2016-11-21 DIAGNOSIS — Z951 Presence of aortocoronary bypass graft: Secondary | ICD-10-CM

## 2016-11-21 DIAGNOSIS — I214 Non-ST elevation (NSTEMI) myocardial infarction: Secondary | ICD-10-CM

## 2016-11-21 DIAGNOSIS — Z48812 Encounter for surgical aftercare following surgery on the circulatory system: Secondary | ICD-10-CM | POA: Diagnosis not present

## 2016-11-21 NOTE — Progress Notes (Signed)
Daily Session Note  Patient Details  Name: Nicholas Torres MRN: [id number] Date of Birth: Jun 20, 1961 Referring Provider:   Flowsheet Row Cardiac Rehab from 09/01/2016 in [facility] Cardiac and Pulmonary Rehab  Referring Provider  Neoma Laming MD      Encounter Date: 11/21/2016  Check In:     Session Check In - 11/21/16 0816      Check-In   Location [facility]-Cardiac & Pulmonary Rehab   Staff Present Alberteen Sam, MA, ACSM RCEP, Exercise Physiologist;Susanne Bice, RN, BSN, CCRP;Laureen Owens Shark, BS, RRT, Respiratory Therapist   Supervising physician immediately available to respond to emergencies See telemetry face sheet for immediately available ER MD   Medication changes reported     No   Fall or balance concerns reported    No   Warm-up and Cool-down Performed on first and last piece of equipment   Resistance Training Performed Yes   VAD Patient? No     Pain Assessment   Currently in Pain? No/denies   Multiple Pain Sites No         Goals Met:  Independence with exercise equipment Exercise tolerated well No report of cardiac concerns or symptoms Strength training completed today  Goals Unmet:  Not Applicable  Comments:  Nicholas Torres graduated today from cardiac rehab with 36 sessions completed.  Details of the patient's exercise prescription and what He needs to do in order to continue the prescription and progress were discussed with patient.  Patient was given a copy of prescription and goals.  Patient verbalized understanding.  Nicholas Torres plans to continue to exercise by walking and lifting weights at home.    Dr. Emily Filbert is Medical Director for [place] and [facility].

## 2016-11-21 NOTE — Progress Notes (Signed)
Discharge Summary  Patient Details  Name: Nicholas Torres MRN: [id number] Date of Birth: 18-Sep-1961 Referring Provider:   Flowsheet Row Cardiac Rehab from 09/01/2016 in [facility]  Referring Provider  Neoma Laming MD       Number of Visits: 36  Reason for Discharge:  Patient reached a stable level of exercise. Patient independent in their exercise.  Smoking History:  History  Smoking Status  . Never Smoker  Smokeless Tobacco  . Never Used    Diagnosis:  NSTEMI (non-ST elevated myocardial infarction) ([place])  S/P CABG x 4  ADL [facility]:   Initial Exercise Prescription:     Initial Exercise Prescription - 09/04/16 1300      Date of Initial Exercise RX and Referring Provider   Date 09/02/16   Referring Provider Neoma Laming MD     Treadmill   MPH 2.5   Grade 2   Minutes 15   METs 3.6     Recumbant Elliptical   Level 2   RPM 50   Minutes 15   METs 2     T5 Nustep   Level 3   Minutes 15   METs 2     Prescription Details   Frequency (times per week) 2   Duration Progress to 45 minutes of aerobic exercise without signs/symptoms of physical distress     Intensity   THRR 40-80% of Max Heartrate 127-154   Ratings of Perceived Exertion 11-15   Perceived Dyspnea 0-4     Progression   Progression Continue to progress workloads to maintain intensity without signs/symptoms of physical distress.     Resistance Training   Training Prescription Yes   Weight 3 lbs   Reps 10-12      Discharge Exercise Prescription (Final Exercise Prescription Changes):     Exercise Prescription Changes - 10/24/16 1500      Exercise Review   Progression Yes     Response to Exercise   Blood Pressure (Admit) 114/70   Blood Pressure (Exercise) 168/78   Blood Pressure (Exit) 104/66   Heart Rate (Admit) 88 bpm   Heart Rate (Exercise) 158 bpm   Heart Rate (Exit) 101 bpm   Rating of Perceived Exertion (Exercise) 13   Symptoms none   Comments Home  Exercise Guidelines given 09/12/16   Duration Progress to 45 minutes of aerobic exercise without signs/symptoms of physical distress   Intensity THRR unchanged     Progression   Progression Continue to progress workloads to maintain intensity without signs/symptoms of physical distress.   Average METs 4.65     Resistance Training   Training Prescription Yes   Weight 5 lbs   Reps 10-12     Interval Training   Interval Training Yes   Equipment T5 Nustep   Comments 2 min off  30 sec on     Treadmill   MPH 3.8   Grade 2   Minutes 15   METs 4.96     Recumbant Elliptical   Level 5   RPM 50   Minutes 15   METs 5     T5 Nustep   Level 4   Minutes 15   METs 4     Home Exercise Plan   Plans to continue exercise at Home  gym in complex (treadmill, weights) and walking   Frequency Add 3 additional days to program exercise sessions.      Functional Capacity:     6 Minute Walk    Row  Name 09/01/16 1636 11/09/16 1015       6 Minute Walk   Phase Initial Discharge    Distance 1410 feet 1702 feet    Distance % Change  - 20.7 %  292 ft    Walk Time 6 minutes 6 minutes    # of Rest Breaks 0 0    MPH 2.67 3.22    METS 4.15 4.72    RPE 11 11    Perceived Dyspnea  0 0    VO2 Peak 14.54 16.5    Symptoms No No    Resting HR 101 bpm 65 bpm    Resting BP 128/80 124/78    Max Ex. HR 123 bpm 130 bpm    Max Ex. BP 118/74 130/76    2 Minute Post BP 104/78  -       Psychological, QOL, Others - Outcomes: PHQ 2/9: Depression screen [facility] 2/9 10/31/2016 09/01/2016  Decreased Interest 0 0  Down, Depressed, Hopeless 0 0  PHQ - 2 Score 0 0  Altered sleeping 0 2  Tired, decreased energy 0 2  Change in appetite 0 1  Feeling bad or failure about yourself  0 1  Trouble concentrating 0 0  Moving slowly or fidgety/restless 0 0  Suicidal thoughts 0 0  PHQ-9 Score 0 6  Difficult doing work/chores Not difficult at all Not difficult at all    Quality of Life:     Quality of  Life - 10/31/16 1112      Quality of Life Scores   Health/Function Post 24 %   Socioeconomic Post 26.25 %   Psych/Spiritual Post 24 %   Family Post 27.6 %   GLOBAL Post 25.03 %      Personal Goals: Goals established at orientation with interventions provided to work toward goal.     Personal Goals and Risk Factors at Admission - 09/01/16 1630      Core Components/Risk Factors/Patient Goals on Admission    Weight Management Yes;Weight Maintenance   Intervention Weight Management: Develop a combined nutrition and exercise program designed to reach desired caloric intake, while maintaining appropriate intake of nutrient and fiber, sodium and fats, and appropriate energy expenditure required for the weight goal.;Weight Management: Provide education and appropriate resources to help participant work on and attain dietary goals.   Admit Weight 167 lb (75.8 kg)   Expected Outcomes Weight Maintenance: Understanding of the daily nutrition guidelines, which includes 25-35% calories from fat, 7% or less cal from saturated fats, less than 200mg  cholesterol, less than 1.5gm of sodium, & 5 or more servings of fruits and vegetables daily   Sedentary Yes   Intervention Provide advice, education, support and counseling about physical activity/exercise needs.;Develop an individualized exercise prescription for aerobic and resistive training based on initial evaluation findings, risk stratification, comorbidities and participant's personal goals.   Expected Outcomes Achievement of increased cardiorespiratory fitness and enhanced flexibility, muscular endurance and strength shown through measurements of functional capacity and personal statement of participant.   Increase Strength and Stamina Yes   Intervention Provide advice, education, support and counseling about physical activity/exercise needs.;Develop an individualized exercise prescription for aerobic and resistive training based on initial evaluation  findings, risk stratification, comorbidities and participant's personal goals.   Expected Outcomes Achievement of increased cardiorespiratory fitness and enhanced flexibility, muscular endurance and strength shown through measurements of functional capacity and personal statement of participant.   Hypertension Yes   Intervention Provide education on lifestyle modifcations including regular physical activity/exercise,  weight management, moderate sodium restriction and increased consumption of fresh fruit, vegetables, and low fat dairy, alcohol moderation, and smoking cessation.;Monitor prescription use compliance.   Expected Outcomes Short Term: Continued assessment and intervention until BP is < 140/1mm HG in hypertensive participants. < 130/23mm HG in hypertensive participants with diabetes, heart failure or chronic kidney disease.;Long Term: Maintenance of blood pressure at goal levels.   Lipids Yes   Intervention Provide education and support for participant on nutrition & aerobic/resistive exercise along with prescribed medications to achieve LDL 70mg , HDL >40mg .   Expected Outcomes Short Term: Participant states understanding of desired cholesterol values and is compliant with medications prescribed. Participant is following exercise prescription and nutrition guidelines.;Long Term: Cholesterol controlled with medications as prescribed, with individualized exercise RX and with personalized nutrition plan. Value goals: LDL < 70mg , HDL > 40 mg.   Stress Yes   Intervention Offer individual and/or small group education and counseling on adjustment to heart disease, stress management and health-related lifestyle change. Teach and support self-help strategies.;Refer participants experiencing significant psychosocial distress to appropriate mental health specialists for further evaluation and treatment. When possible, include family members and significant others in education/counseling sessions.    Expected Outcomes Short Term: Participant demonstrates changes in health-related behavior, relaxation and other stress management skills, ability to obtain effective social support, and compliance with psychotropic medications if prescribed.;Long Term: Emotional wellbeing is indicated by absence of clinically significant psychosocial distress or social isolation.  Concerned out of work till Jan. Has 30 years in with [employer].        Personal Goals Discharge:     Goals and Risk Factor Review    Row Name 10/03/16 1010 10/26/16 0955           Core Components/Risk Factors/Patient Goals Review   Personal Goals Review Weight Management/Obesity;Hypertension;Lipids Weight Management/Obesity;Sedentary;Increase Strength and Stamina;Hypertension;Lipids;Stress      Review Speaking with Nicholas Torres today.  He has gained some weight since admission to program working toward his goal for weight gain. Blood pressure and cholesterol remain controlled with daily meds and his working on nutrition goals, along with the exercise program at [facility].  Nicholas Torres is not sure what weight he should be aiming for. So will ask RD to advise Nicholas Torres about this goal.  Nicholas Torres's weight has creeped back up to 175 lbs.  He is feeling stronger and regaining his muscle tone.  He is still experiencing some incisional tenderness and itching.  He is going to try some coaco butter to try to relieve this. He is watching his posture and sleeping on an incline pillow to help.  His blood pressures have been good and no problems with the statins.  He is walking at home on his off days.        Expected Outcomes Continue to work on nutrition goals and his exercise prescription to continue reaching toward his goals.  Nicholas Torres will conintue to work on his strength and risk factors.         Nutrition & Weight - Outcomes:     Pre Biometrics - 09/01/16 1638      Pre Biometrics   Height 5' 5.5" (1.664 m)   Weight 167 lb (75.8 kg)   Waist  Circumference 35 inches   Hip Circumference 38.25 inches   Waist to Hip Ratio 0.92 %   BMI (Calculated) 27.4   Single Leg Stand 30 seconds         Post Biometrics - 11/09/16 1119  Post  Biometrics   Height 5' 5.5" (1.664 m)   Weight 175 lb (79.4 kg)   Waist Circumference 33.5 inches   Hip Circumference 38 inches   Waist to Hip Ratio 0.88 %   BMI (Calculated) 28.7   Single Leg Stand 30 seconds      Nutrition:     Nutrition Therapy & Goals - 10/03/16 0940      Personal Nutrition Goals   Personal Goal #2 To try [employer] brand of whole grain pasta counting 1 cup of cooked pasta as 2 servings of whole grain. Also, use brown rice and oatmeal to boost whole grain intake.      Nutrition Discharge:     Nutrition Assessments - 10/31/16 1111      Rate Your Plate Scores   Pre Score 69   Pre Score % 90 %   Post Score 74   Post Score % 82.2 %   % Change -7.8 %      Education Questionnaire Score:     Knowledge Questionnaire Score - 10/31/16 1111      Knowledge Questionnaire Score   Pre Score 21/28   Post Score 23/28      Goals reviewed with patient; copy given to patient.

## 2016-11-21 NOTE — Progress Notes (Signed)
Cardiac Individual Treatment Plan  Patient Details  Name: Nicholas Torres MRN: [id number] Date of Birth: 06-03-1961 Referring Provider:   Flowsheet Row Cardiac Rehab from 09/01/2016 in [facility] Cardiac and Pulmonary Rehab  Referring Provider  Neoma Laming MD      Initial Encounter Date:  Flowsheet Row Cardiac Rehab from 09/01/2016 in [facility] Cardiac and Pulmonary Rehab  Date  09/02/16  Referring Provider  Neoma Laming MD      Visit Diagnosis: NSTEMI (non-ST elevated myocardial infarction) ([place])  S/P CABG x 4  Patient's Home Medications on Admission:  Current Outpatient Prescriptions:  .  aspirin EC 325 MG EC tablet, Take 1 tablet (325 mg total) by mouth daily., Disp: 30 tablet, Rfl: 0 .  fluticasone (FLONASE) 50 MCG/ACT nasal spray, Place 1 spray into both nostrils at bedtime as needed for rhinitis., Disp: , Rfl:  .  metoprolol tartrate (LOPRESSOR) 25 MG tablet, 12.5 mg. , Disp: , Rfl:  .  rosuvastatin (CRESTOR) 10 MG tablet, Take 10 mg by mouth daily., Disp: , Rfl:   Past Medical History: Past Medical History:  Diagnosis Date  . CAD (coronary artery disease)   . High cholesterol   . Hypertension   . Pollen allergy     Tobacco Use: History  Smoking Status  . Never Smoker  Smokeless Tobacco  . Never Used    Labs: Recent Review Flowsheet Data    Labs for ITP Cardiac and Pulmonary Rehab Latest Ref Rng & Units 07/26/2016 07/26/2016 07/26/2016 07/27/2016 07/27/2016   Cholestrol 0 - 200 mg/dL - - - - -   LDLCALC 0 - 99 mg/dL - - - - -   HDL >40 mg/dL - - - - -   Trlycerides <150 mg/dL - - - - -   Hemoglobin A1c 4.8 - 5.6 % - - - - -   PHART 7.350 - 7.450 7.387 - 7.334(L) 7.330(L) -   PCO2ART 32.0 - 48.0 mmHg 36.8 - 40.7 42.3 -   HCO3 20.0 - 28.0 mmol/L 22.5 - 21.1 21.9 -   TCO2 0 - 100 mmol/L _0 ACIDBASEDEF 0.0 - 2.0 mmol/L 3.0(H) - 4.0(H) 3.0(H) -   O2SAT % 95.0 - 98.0 99.0 -       Exercise Target Goals:    Exercise Program Goal: Individual exercise  prescription set with THRR, safety & activity barriers. Participant demonstrates ability to understand and report RPE using BORG scale, to self-measure pulse accurately, and to acknowledge the importance of the exercise prescription.  Exercise Prescription Goal: Starting with aerobic activity 30 plus minutes a day, 3 days per week for initial exercise prescription. Provide home exercise prescription and guidelines that participant acknowledges understanding prior to discharge.  Activity Barriers & Risk Stratification:     Activity Barriers & Cardiac Risk Stratification - 09/01/16 1639      Activity Barriers & Cardiac Risk Stratification   Activity Barriers None      6 Minute Walk:     6 Minute Walk    Row Name 09/01/16 1636 11/09/16 1015       6 Minute Walk   Phase Initial Discharge    Distance 1410 feet 1702 feet    Distance % Change  - 20.7 %  292 ft    Walk Time 6 minutes 6 minutes    # of Rest Breaks 0 0    MPH 2.67 3.22    METS 4.15 4.72    RPE 11 11  Perceived Dyspnea  0 0    VO2 Peak 14.54 16.5    Symptoms No No    Resting HR 101 bpm 65 bpm    Resting BP 128/80 124/78    Max Ex. HR 123 bpm 130 bpm    Max Ex. BP 118/74 130/76    2 Minute Post BP 104/78  -       Initial Exercise Prescription:     Initial Exercise Prescription - 09/04/16 1300      Date of Initial Exercise RX and Referring Provider   Date 09/02/16   Referring Provider Neoma Laming MD     Treadmill   MPH 2.5   Grade 2   Minutes 15   METs 3.6     Recumbant Elliptical   Level 2   RPM 50   Minutes 15   METs 2     T5 Nustep   Level 3   Minutes 15   METs 2     Prescription Details   Frequency (times per week) 2   Duration Progress to 45 minutes of aerobic exercise without signs/symptoms of physical distress     Intensity   THRR 40-80% of Max Heartrate 127-154   Ratings of Perceived Exertion 11-15   Perceived Dyspnea 0-4     Progression   Progression Continue to progress  workloads to maintain intensity without signs/symptoms of physical distress.     Resistance Training   Training Prescription Yes   Weight 3 lbs   Reps 10-12      Perform Capillary Blood Glucose checks as needed.  Exercise Prescription Changes:     Exercise Prescription Changes    Row Name 09/01/16 1600 09/13/16 0900 09/26/16 1500 10/11/16 1500 10/24/16 1500     Exercise Review   Progression -  Walk test results Yes Yes Yes Yes     Response to Exercise   Blood Pressure (Admit) 128/80 132/70 126/80 120/82 114/70   Blood Pressure (Exercise) 118/74 134/72 124/70 148/78 168/78   Blood Pressure (Exit) 104/78 124/78 114/60 112/60 104/66   Heart Rate (Admit) 101 bpm 61 bpm 83 bpm 107 bpm 88 bpm   Heart Rate (Exercise) 123 bpm 132 bpm 137 bpm 156 bpm 158 bpm   Heart Rate (Exit) 94 bpm 82 bpm 90 bpm 101 bpm 101 bpm   Oxygen Saturation (Admit) 96 %  -  -  -  -   Oxygen Saturation (Exercise) 99 %  -  -  -  -   Rating of Perceived Exertion (Exercise) _0 Symptoms _1    Comments  -  - Home Exercise Guidelines given 09/12/16 Home Exercise Guidelines given 09/12/16 Home Exercise Guidelines given 09/12/16   Duration  - Progress to 45 minutes of aerobic exercise without signs/symptoms of physical distress Progress to 45 minutes of aerobic exercise without signs/symptoms of physical distress Progress to 45 minutes of aerobic exercise without signs/symptoms of physical distress Progress to 45 minutes of aerobic exercise without signs/symptoms of physical distress   Intensity  - THRR unchanged THRR unchanged THRR unchanged THRR unchanged     Progression   Progression  - Continue to progress workloads to maintain intensity without signs/symptoms of physical distress. Continue to progress workloads to maintain intensity without signs/symptoms of physical distress. Continue to progress workloads to maintain intensity without signs/symptoms of physical distress. Continue  to progress workloads to maintain intensity without signs/symptoms of physical distress.   Average METs  -  2.84 3.52 6.35 4.65     Resistance Training   Training Prescription  - Yes Yes Yes Yes   Weight  - 5 lbs 5 lbs 5 lbs 5 lbs   Reps  - 10-12 10-12 10-12 10-12     Interval Training   Interval Training  - No No Yes Yes   Equipment  -  -  - T5 Nustep T5 Nustep   Comments  -  -  - 2 min off  30 sec on 2 min off  30 sec on     Treadmill   MPH  - 3 3.5 3.5 3.8   Grade  - '2 2 2 2   '$ Minutes  - '15 15 15 15   '$ METs  - 4.12 4.65 4.65 4.96     Recumbant Elliptical   Level  - '2 4 5 5   '$ RPM  - 50 50 50 50   Minutes  - '15 15 15 15   '$ METs  - 2.1 3.2 4.8 5     T5 Nustep   Level  - '3 4 4 4   '$ Minutes  - '15 15 15 15   '$ METs  - 2.3 2.7 9.6 4     Home Exercise Plan   Plans to continue exercise at  -  - Home  gym in complex (treadmill, weights) and walking Home  gym in complex (treadmill, weights) and walking Home  gym in complex (treadmill, weights) and walking   Frequency  -  - Add 3 additional days to program exercise sessions. Add 3 additional days to program exercise sessions. Add 3 additional days to program exercise sessions.      Exercise Comments:     Exercise Comments    Row Name 09/07/16 0940 09/13/16 0935 09/26/16 1547 10/11/16 1504 10/24/16 1548   Exercise Comments First full day of exercise!  Patient was oriented to gym and equipment including functions, settings, policies, and procedures.  Patient's individual exercise prescription and treatment plan were reviewed.  All starting workloads were established based on the results of the 6 minute walk test done at initial orientation visit.  The plan for exercise progression was also introduced and progression will be customized based on patient's performance and goals. Tiran is off to a great start with exercise.  We will continue to work with him on progression. Young is doing well in rehab.  He is asking good questions in  education classes and about his exercise routine.  We will continue to monitor his progression. Jawuan continues to do well in rehab.  He is starting to move up his workloads more.  We will continue to monitor his progression. Kenley has been doing well with rehab.  He continues to make improvements.  We will watch his progress.   [place] Name 11/09/16 1016 11/21/16 1032         Exercise Comments Sterlin improved his walk test by over 20%! Jong graduated today from cardiac rehab with 36 sessions completed.  Details of the patient's exercise prescription and what He needs to do in order to continue the prescription and progress were discussed with patient.  Patient was given a copy of prescription and goals.  Patient verbalized understanding.  Cayne plans to continue to exercise by walking and lifting weights at home.         Discharge Exercise Prescription (Final Exercise Prescription Changes):     Exercise Prescription Changes - 10/24/16 1500      Exercise  Review   Progression Yes     Response to Exercise   Blood Pressure (Admit) 114/70   Blood Pressure (Exercise) 168/78   Blood Pressure (Exit) 104/66   Heart Rate (Admit) 88 bpm   Heart Rate (Exercise) 158 bpm   Heart Rate (Exit) 101 bpm   Rating of Perceived Exertion (Exercise) 13   Symptoms none   Comments Home Exercise Guidelines given 09/12/16   Duration Progress to 45 minutes of aerobic exercise without signs/symptoms of physical distress   Intensity THRR unchanged     Progression   Progression Continue to progress workloads to maintain intensity without signs/symptoms of physical distress.   Average METs 4.65     Resistance Training   Training Prescription Yes   Weight 5 lbs   Reps 10-12     Interval Training   Interval Training Yes   Equipment T5 Nustep   Comments 2 min off  30 sec on     Treadmill   MPH 3.8   Grade 2   Minutes 15   METs 4.96     Recumbant Elliptical   Level 5   RPM 50   Minutes 15    METs 5     T5 Nustep   Level 4   Minutes 15   METs 4     Home Exercise Plan   Plans to continue exercise at Home  gym in complex (treadmill, weights) and walking   Frequency Add 3 additional days to program exercise sessions.      Nutrition:  Target Goals: Understanding of nutrition guidelines, daily intake of sodium '1500mg'$ , cholesterol '200mg'$ , calories 30% from fat and 7% or less from saturated fats, daily to have 5 or more servings of fruits and vegetables.  Biometrics:     Pre Biometrics - 09/01/16 1638      Pre Biometrics   Height 5' 5.5" (1.664 m)   Weight 167 lb (75.8 kg)   Waist Circumference 35 inches   Hip Circumference 38.25 inches   Waist to Hip Ratio 0.92 %   BMI (Calculated) 27.4   Single Leg Stand 30 seconds         Post Biometrics - 11/09/16 1119       Post  Biometrics   Height 5' 5.5" (1.664 m)   Weight 175 lb (79.4 kg)   Waist Circumference 33.5 inches   Hip Circumference 38 inches   Waist to Hip Ratio 0.88 %   BMI (Calculated) 28.7   Single Leg Stand 30 seconds      Nutrition Therapy Plan and Nutrition Goals:     Nutrition Therapy & Goals - 10/03/16 0940      Personal Nutrition Goals   Personal Goal #2 To try Mueller's brand of whole grain pasta counting 1 cup of cooked pasta as 2 servings of whole grain. Also, use brown rice and oatmeal to boost whole grain intake.      Nutrition Discharge: Rate Your Plate Scores:     Nutrition Assessments - 10/31/16 1111      Rate Your Plate Scores   Pre Score 69   Pre Score % 90 %   Post Score 74   Post Score % 82.2 %   % Change -7.8 %      Nutrition Goals Re-Evaluation:     Nutrition Goals Re-Evaluation    Row Name 10/03/16 0941 10/03/16 0958 10/26/16 0958         Personal Goal #1 Re-Evaluation   Personal Goal #1  To try margarines with no trans fat. Refer to examples given. To try margarines with no trans fat. Refer to examples given. To try margarines with no trans fat. Refer to  examples given.     Goal Progress Seen No  - No     Comments Not shopped for this yet  Expected outcome: Will look for this type of butter when he needs to replenish butter at home. Not shopped for this yet  Expected outcome: Will look for this type of butter when he needs to replenish butter at home. He has not bought this yet, but will try to get the Smart Balance butter.       Personal Goal #2 Re-Evaluation   Personal Goal #2 To try Mueller's brand of whole grain pasta counting 1 cup of cooked pasta as 2 servings of whole grain. Also, use brown rice and oatmeal to boost whole grain intake. To try Mueller's brand of whole grain pasta counting 1 cup of cooked pasta as 2 servings of whole grain. Also, use brown rice and oatmeal to boost whole grain intake. To try Mueller's brand of whole grain pasta counting 1 cup of cooked pasta as 2 servings of whole grain. Also, use brown rice and oatmeal to boost whole grain intake.     Goal Progress Seen Yes  - Met     Comments Is eating brown rice already.  Will try whole wheat pasta.  Expected outcome:  Is eating brown rice already.  Will try whole wheat pasta.  Expected outcome:  Comtinue to shop for the whole grain pasta and rice Has switched to whole grains, but still has a few white grains at home.       Personal Goal #3 Re-Evaluation   Personal Goal #3 Use Mrs. DASH seasonings Use Mrs. DASH seasonings Use Mrs. DASH seasonings     Goal Progress Seen  - Yes Met     Comments  - HAs already made changes in spice use. Expected outcome: Continued use of spices without added sodium Really enjoying the chicken seasoning and lemon pepper.       Personal Goal #4 Re-Evaluation   Personal Goal #4 Look up nutrition information especially for restaurants using calorieking or myfitness pal app Look up nutrition information especially for restaurants using calorieking or myfitness pal app Look up nutrition information especially for restaurants using calorieking or  myfitness pal app     Goal Progress Seen  - Yes Met     Comments  - Is starting to read the nutrition ;abels at restaurants.    Expected outcome: Continue to read the nutrition labels Using CalorieKing anytime he is eating out.        Psychosocial: Target Goals: Acknowledge presence or absence of depression, maximize coping skills, provide positive support system. Participant is able to verbalize types and ability to use techniques and skills needed for reducing stress and depression.  Initial Review & Psychosocial Screening:     Initial Psych Review & Screening - 09/01/16 1631      Initial Review   Current issues with Current Stress Concerns     Family Dynamics   Good Support System? Yes   Comments Concerned out of work till Jan. Has 30 years in with [employer].     Barriers   Psychosocial barriers to participate in program The patient should benefit from training in stress management and relaxation.     Screening Interventions   Interventions Encouraged to exercise  Quality of Life Scores:     Quality of Life - 10/31/16 1112      Quality of Life Scores   Health/Function Post 24 %   Socioeconomic Post 26.25 %   Psych/Spiritual Post 24 %   Family Post 27.6 %   GLOBAL Post 25.03 %      PHQ-9: Recent Review Flowsheet Data    Depression screen [facility] 2/9 10/31/2016 09/01/2016   Decreased Interest 0 0   Down, Depressed, Hopeless 0 0   PHQ - 2 Score 0 0   Altered sleeping 0 2   Tired, decreased energy 0 2   Change in appetite 0 1   Feeling bad or failure about yourself  0 1   Trouble concentrating 0 0   Moving slowly or fidgety/restless 0 0   Suicidal thoughts 0 0   PHQ-9 Score 0 6   Difficult doing work/chores Not difficult at all Not difficult at all      Psychosocial Evaluation and Intervention:     Psychosocial Evaluation - 11/21/16 0943      Discharge Psychosocial Assessment & Intervention   Comments Counselor met with Mr. Treptow today with him reporting  much benefit from this program.  He is walking with greater ease and feels increased strength and stamina.  He is concerned about going back to work and being able to do the heavy lifting involved, but that is not until February when he is scheduled to return.  He plans to continue working out consistently in the meantime.  His mood is positive and he is managing stress well with exercise and improved self-care.  He is sleeping well and has a good appetite.  Counselor encouraged Mr. Lashley to consistently exercise beyond graduation from this program in order to maintain this progress.  Counselor commended him on the hard work and success he has experienced.        Psychosocial Re-Evaluation:     Psychosocial Re-Evaluation    [place] Name 10/26/16 1000             Psychosocial Re-Evaluation   Interventions Encouraged to attend Cardiac Rehabilitation for the exercise;Stress management education       Comments Dwyne is still having some stress due to not being able to go back to work.  He is worried about being able to lift and manauever like he should at work.  He is not scheduled to return until mid Feburary which should help with this continued increase in stress.  He is sleeping good on couch and in bed using an incline pillow..          Vocational Rehabilitation: Provide vocational rehab assistance to qualifying candidates.   Vocational Rehab Evaluation & Intervention:     Vocational Rehab - 09/01/16 1612      Initial Vocational Rehab Evaluation & Intervention   Assessment shows need for Vocational Rehabilitation No      Education: Education Goals: Education classes will be provided on a weekly basis, covering required topics. Participant will state understanding/return demonstration of topics presented.  Learning Barriers/Preferences:     Learning Barriers/Preferences - 09/01/16 1639      Learning Barriers/Preferences   Learning Barriers None   Learning Preferences None       Education Topics: General Nutrition Guidelines/Fats and Fiber: -Group instruction provided by verbal, written material, models and posters to present the general guidelines for heart healthy nutrition. Gives an explanation and review of dietary fats and fiber. Flowsheet Row Cardiac Rehab from  11/21/2016 in [facility] Cardiac and Pulmonary Rehab  Date  11/21/16  Educator  PI  Instruction Review Code  2- meets goals/outcomes      Controlling Sodium/Reading Food Labels: -Group verbal and written material supporting the discussion of sodium use in heart healthy nutrition. Review and explanation with models, verbal and written materials for utilization of the food label. Flowsheet Row Cardiac Rehab from 11/21/2016 in [facility] Cardiac and Pulmonary Rehab  Date  10/03/16  Educator  PI  Instruction Review Code  2- meets goals/outcomes      Exercise Physiology & Risk Factors: - Group verbal and written instruction with models to review the exercise physiology of the cardiovascular system and associated critical values. Details cardiovascular disease risk factors and the goals associated with each risk factor. Flowsheet Row Cardiac Rehab from 11/21/2016 in [facility] Cardiac and Pulmonary Rehab  Date  09/21/16  Educator  [facility]  Instruction Review Code  2- meets goals/outcomes      Aerobic Exercise & Resistance Training: - Gives group verbal and written discussion on the health impact of inactivity. On the components of aerobic and resistive training programs and the benefits of this training and how to safely progress through these programs. Flowsheet Row Cardiac Rehab from 11/21/2016 in [facility] Cardiac and Pulmonary Rehab  Date  10/10/16  Educator  [facility]  Instruction Review Code  2- meets goals/outcomes      Flexibility, Balance, General Exercise Guidelines: - Provides group verbal and written instruction on the benefits of flexibility and balance training programs. Provides general exercise guidelines with  specific guidelines to those with heart or lung disease. Demonstration and skill practice provided.   Stress Management: - Provides group verbal and written instruction about the health risks of elevated stress, cause of high stress, and healthy ways to reduce stress. Flowsheet Row Cardiac Rehab from 11/21/2016 in [facility] Cardiac and Pulmonary Rehab  Date  10/26/16  Educator  TS  Instruction Review Code  2- meets goals/outcomes      Depression: - Provides group verbal and written instruction on the correlation between heart/lung disease and depressed mood, treatment options, and the stigmas associated with seeking treatment. Flowsheet Row Cardiac Rehab from 11/21/2016 in [facility] Cardiac and Pulmonary Rehab  Date  09/28/16  Educator  CE  Instruction Review Code  2- meets goals/outcomes      Anatomy & Physiology of the Heart: - Group verbal and written instruction and models provide basic cardiac anatomy and physiology, with the coronary electrical and arterial systems. Review of: AMI, Angina, Valve disease, Heart Failure, Cardiac Arrhythmia, Pacemakers, and the ICD. Flowsheet Row Cardiac Rehab from 11/21/2016 in [facility] Cardiac and Pulmonary Rehab  Date  10/17/16  Educator  SB  Instruction Review Code  2- meets goals/outcomes      Cardiac Procedures: - Group verbal and written instruction and models to describe the testing methods done to diagnose heart disease. Reviews the outcomes of the test results. Describes the treatment choices: Medical Management, Angioplasty, or Coronary Bypass Surgery. Flowsheet Row Cardiac Rehab from 11/21/2016 in [facility] Cardiac and Pulmonary Rehab  Date  10/24/16  Educator  SB  Instruction Review Code  2- meets goals/outcomes      Cardiac Medications: - Group verbal and written instruction to review commonly prescribed medications for heart disease. Reviews the medication, class of the drug, and side effects. Includes the steps to properly store meds and maintain the  prescription regimen. Flowsheet Row Cardiac Rehab from 11/21/2016 in [facility] Cardiac and Pulmonary Rehab  Date  11/02/16 [10/31 Part 2 SB]  Educator  AS  Instruction Review Code  2- meets goals/outcomes      Go Sex-Intimacy & Heart Disease, Get SMART - Goal Setting: - Group verbal and written instruction through game format to discuss heart disease and the return to sexual intimacy. Provides group verbal and written material to discuss and apply goal setting through the application of the S.M.A.R.T. Method. Flowsheet Row Cardiac Rehab from 11/21/2016 in [facility] Cardiac and Pulmonary Rehab  Date  10/24/16  Educator  SB  Instruction Review Code  2- meets goals/outcomes      Other Matters of the Heart: - Provides group verbal, written materials and models to describe Heart Failure, Angina, Valve Disease, and Diabetes in the realm of heart disease. Includes description of the disease process and treatment options available to the cardiac patient.   Exercise & Equipment Safety: - Individual verbal instruction and demonstration of equipment use and safety with use of the equipment. Flowsheet Row Cardiac Rehab from 11/21/2016 in [facility] Cardiac and Pulmonary Rehab  Date  09/01/16  Educator  C. EnterkinRN  Instruction Review Code  2- meets goals/outcomes      Infection Prevention: - Provides verbal and written material to individual with discussion of infection control including proper hand washing and proper equipment cleaning during exercise session. Flowsheet Row Cardiac Rehab from 11/21/2016 in [facility] Cardiac and Pulmonary Rehab  Date  09/01/16  Educator  C. EnterkinRN  Instruction Review Code  2- meets goals/outcomes      Falls Prevention: - Provides verbal and written material to individual with discussion of falls prevention and safety. Flowsheet Row Cardiac Rehab from 11/21/2016 in [facility] Cardiac and Pulmonary Rehab  Date  09/01/16  Educator  C. Henry  Instruction Review Code  2- meets  goals/outcomes      Diabetes: - Individual verbal and written instruction to review signs/symptoms of diabetes, desired ranges of glucose level fasting, after meals and with exercise. Advice that pre and post exercise glucose checks will be done for 3 sessions at entry of program.    Knowledge Questionnaire Score:     Knowledge Questionnaire Score - 10/31/16 1111      Knowledge Questionnaire Score   Pre Score 21/28   Post Score 23/28      Core Components/Risk Factors/Patient Goals at Admission:     Personal Goals and Risk Factors at Admission - 09/01/16 1630      Core Components/Risk Factors/Patient Goals on Admission    Weight Management Yes;Weight Maintenance   Intervention Weight Management: Develop a combined nutrition and exercise program designed to reach desired caloric intake, while maintaining appropriate intake of nutrient and fiber, sodium and fats, and appropriate energy expenditure required for the weight goal.;Weight Management: Provide education and appropriate resources to help participant work on and attain dietary goals.   Admit Weight 167 lb (75.8 kg)   Expected Outcomes Weight Maintenance: Understanding of the daily nutrition guidelines, which includes 25-35% calories from fat, 7% or less cal from saturated fats, less than '200mg'$  cholesterol, less than 1.5gm of sodium, & 5 or more servings of fruits and vegetables daily   Sedentary Yes   Intervention Provide advice, education, support and counseling about physical activity/exercise needs.;Develop an individualized exercise prescription for aerobic and resistive training based on initial evaluation findings, risk stratification, comorbidities and participant's personal goals.   Expected Outcomes Achievement of increased cardiorespiratory fitness and enhanced flexibility, muscular endurance and strength shown through measurements of functional capacity and personal statement of  participant.   Increase Strength and  Stamina Yes   Intervention Provide advice, education, support and counseling about physical activity/exercise needs.;Develop an individualized exercise prescription for aerobic and resistive training based on initial evaluation findings, risk stratification, comorbidities and participant's personal goals.   Expected Outcomes Achievement of increased cardiorespiratory fitness and enhanced flexibility, muscular endurance and strength shown through measurements of functional capacity and personal statement of participant.   Hypertension Yes   Intervention Provide education on lifestyle modifcations including regular physical activity/exercise, weight management, moderate sodium restriction and increased consumption of fresh fruit, vegetables, and low fat dairy, alcohol moderation, and smoking cessation.;Monitor prescription use compliance.   Expected Outcomes Short Term: Continued assessment and intervention until BP is < 140/56m HG in hypertensive participants. < 130/845mHG in hypertensive participants with diabetes, heart failure or chronic kidney disease.;Long Term: Maintenance of blood pressure at goal levels.   Lipids Yes   Intervention Provide education and support for participant on nutrition & aerobic/resistive exercise along with prescribed medications to achieve LDL '70mg'$ , HDL >'40mg'$ .   Expected Outcomes Short Term: Participant states understanding of desired cholesterol values and is compliant with medications prescribed. Participant is following exercise prescription and nutrition guidelines.;Long Term: Cholesterol controlled with medications as prescribed, with individualized exercise RX and with personalized nutrition plan. Value goals: LDL < '70mg'$ , HDL > 40 mg.   Stress Yes   Intervention Offer individual and/or small group education and counseling on adjustment to heart disease, stress management and health-related lifestyle change. Teach and support self-help strategies.;Refer participants  experiencing significant psychosocial distress to appropriate mental health specialists for further evaluation and treatment. When possible, include family members and significant others in education/counseling sessions.   Expected Outcomes Short Term: Participant demonstrates changes in health-related behavior, relaxation and other stress management skills, ability to obtain effective social support, and compliance with psychotropic medications if prescribed.;Long Term: Emotional wellbeing is indicated by absence of clinically significant psychosocial distress or social isolation.  Concerned out of work till Jan. Has 30 years in with [employer].       Core Components/Risk Factors/Patient Goals Review:      Goals and Risk Factor Review    Row Name 10/03/16 1010 10/26/16 0955           Core Components/Risk Factors/Patient Goals Review   Personal Goals Review Weight Management/Obesity;Hypertension;Lipids Weight Management/Obesity;Sedentary;Increase Strength and Stamina;Hypertension;Lipids;Stress      Review Speaking with Adams today.  He has gained some weight since admission to program working toward his goal for weight gain. Blood pressure and cholesterol remain controlled with daily meds and his working on nutrition goals, along [facility] is not sure what weight he should be aiming for. So will ask RD to advise Bookert about this goal.  Creed's weight has creeped back up to 175 lbs.  He is feeling stronger and regaining his muscle tone.  He is still experiencing some incisional tenderness and itching.  He is going to try some coaco butter to try to relieve this. He is watching his posture and sleeping on an incline pillow to help.  His blood pressures have been good and no problems with the statins.  He is walking at home on his off days.        Expected Outcomes Continue to work on nutrition goals and his exercise prescription to continue reaching toward his  goals.  Flynt will conintue to work on his strength and risk factors.  Core Components/Risk Factors/Patient Goals at Discharge (Final Review):      Goals and Risk Factor Review - 10/26/16 0955      Core Components/Risk Factors/Patient Goals Review   Personal Goals Review Weight Management/Obesity;Sedentary;Increase Strength and Stamina;Hypertension;Lipids;Stress   Review Hulbert's weight has creeped back up to 175 lbs.  He is feeling stronger and regaining his muscle tone.  He is still experiencing some incisional tenderness and itching.  He is going to try some coaco butter to try to relieve this. He is watching his posture and sleeping on an incline pillow to help.  His blood pressures have been good and no problems with the statins.  He is walking at home on his off days.     Expected Outcomes Neng will conintue to work on his strength and risk factors.      ITP Comments:     ITP Comments    Row Name 09/01/16 1631 09/19/16 0822 09/20/16 0656 10/18/16 0555 11/14/16 0615   ITP Comments ITP started during Medical Review /Orientation appt after Cardiac Rehab informed consent signed.  July brought in his home blood pressure cuff.  The systolic number was comparable, but the diastolic was 30 mmHG higher than what we got.  He will continue to monitor at home. 30 day review completed for Medical Director physician review and signature. Continue ITP unless changes made by physician. 30 day review completed for review by Dr Emily Filbert.  Continue with ITP unless changes noted by Dr Sabra Heck. 30 day review. Continue with ITP unless directed changes per Medical Director review.      Comments: Discharge ITP

## 2017-12-10 ENCOUNTER — Other Ambulatory Visit: Payer: Self-pay

## 2017-12-10 MED ORDER — METOPROLOL TARTRATE 25 MG PO TABS: 12.5000 mg | ORAL_TABLET | Freq: Every day | ORAL | 1 refills | Status: DC

## 2018-01-24 ENCOUNTER — Other Ambulatory Visit: Payer: Self-pay | Admitting: Nurse Practitioner

## 2018-01-24 DIAGNOSIS — J069 Acute upper respiratory infection, unspecified: Secondary | ICD-10-CM

## 2018-01-24 DIAGNOSIS — R059 Cough, unspecified: Secondary | ICD-10-CM

## 2018-01-24 DIAGNOSIS — R05 Cough: Secondary | ICD-10-CM

## 2018-01-24 MED ORDER — HYDROCOD POLST-CPM POLST ER 10-8 MG/5ML PO SUER: 5.0000 mL | Freq: Two times a day (BID) | ORAL | 0 refills | Status: DC | PRN

## 2018-01-24 MED ORDER — AMOXICILLIN 875 MG PO TABS: 875.0000 mg | ORAL_TABLET | Freq: Two times a day (BID) | ORAL | 0 refills | Status: DC

## 2018-01-24 NOTE — Progress Notes (Signed)
Patient in office with his wife, being treated for URI. Sent in amoxicillin 875mg  BID for 10 days and tussionex to use BID as needed for cough. Both prescriptions sent to [facility].

## 2018-02-24 ENCOUNTER — Encounter: Payer: Self-pay | Admitting: Emergency Medicine

## 2018-02-24 ENCOUNTER — Emergency Department
Admission: EM | Admit: 2018-02-24 | Discharge: 2018-02-24 | Disposition: A | Discharge status: Home / Self Care | Payer: BLUE CROSS/BLUE SHIELD | Attending: Emergency Medicine | Admitting: Emergency Medicine

## 2018-02-24 DIAGNOSIS — I10 Essential (primary) hypertension: Secondary | ICD-10-CM | POA: Insufficient documentation

## 2018-02-24 DIAGNOSIS — K625 Hemorrhage of anus and rectum: Secondary | ICD-10-CM | POA: Diagnosis present

## 2018-02-24 DIAGNOSIS — K602 Anal fissure, unspecified: Secondary | ICD-10-CM | POA: Insufficient documentation

## 2018-02-24 DIAGNOSIS — Z951 Presence of aortocoronary bypass graft: Secondary | ICD-10-CM | POA: Diagnosis not present

## 2018-02-24 DIAGNOSIS — I2510 Atherosclerotic heart disease of native coronary artery without angina pectoris: Secondary | ICD-10-CM | POA: Diagnosis not present

## 2018-02-24 MED ORDER — LIDOCAINE 2 % EX GEL: 1.0000 "application " | Freq: Three times a day (TID) | CUTANEOUS | 2 refills | Status: DC | PRN

## 2018-02-24 MED ORDER — NITROGLYCERIN 0.4 % RE OINT: 1.0000 "application " | TOPICAL_OINTMENT | Freq: Two times a day (BID) | RECTAL | 1 refills | Status: DC

## 2018-02-24 NOTE — ED Provider Notes (Signed)
[facility] Emergency Department Provider Note       Time seen: ----------------------------------------- 7:14 PM on 02/24/2018 -----------------------------------------   I have reviewed the triage vital signs and the nursing notes.  HISTORY   Chief Complaint Rectal Bleeding    HPI Nicholas Torres is a 57 y.o. male with a history of coronary artery disease, hyperlipidemia and hypertension who presents to the ED for rectal bleeding.  Patient complains of intermittently blood-streaked stool, bright red blood on the toilet tissue when wiping himself after a bowel movement.  Patient states it feels like something sharp is scraping him when he defecates.  He denies any large quantity, has never had this happen before.  He denies fevers, chills or other complaints.  Past Medical History:  Diagnosis Date  . CAD (coronary artery disease)   . High cholesterol   . Hypertension   . Pollen allergy     Patient Active Problem List   Diagnosis Date Noted  . Syncope 08/11/2016  . CAD (coronary artery disease) 07/26/2016  . 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 CAD (coronary artery disease), native coronary artery 07/25/2016  . NSTEMI (non-ST elevated myocardial infarction) ([place]) 07/23/2016  . Family history of ischemic heart disease (IHD) 07/10/2013    Past Surgical History:  Procedure Laterality Date  . CARDIAC CATHETERIZATION Right 07/24/2016   Procedure: Left Heart Cath and Coronary Angiography;  Surgeon: Dionisio David, MD;  Location: [place] [facility];  Service: Cardiovascular;  Laterality: Right;  . CORONARY ARTERY BYPASS GRAFT N/A 07/26/2016   Procedure: CORONARY ARTERY BYPASS GRAFTING (CABG), ON PUMP, TIMES FOUR, USING BILATERAL INTERNAL MAMMARY ARTERIES, RIGHT GREATER SAPHENOUS VEIN HARVESTED ENDOSCOPICALLY;  Surgeon: Melrose Nakayama, MD;  Location: [place];  Service: Open Heart Surgery;  Laterality: N/A;  . RADIAL ARTERY HARVEST Left 07/26/2016   Procedure:  RADIAL LEFT ARTERY HARVEST;  Surgeon: Melrose Nakayama, MD;  Location: [place];  Service: Open Heart Surgery;  Laterality: Left;  . TEE WITHOUT CARDIOVERSION N/A 07/26/2016   Procedure: TRANSESOPHAGEAL ECHOCARDIOGRAM (TEE);  Surgeon: Melrose Nakayama, MD;  Location: [place];  Service: Open Heart Surgery;  Laterality: N/A;    Allergies No known allergies  Social History Social History   Tobacco Use  . Smoking status: Never Smoker  . Smokeless tobacco: Never Used  Substance Use Topics  . Alcohol use: No  . Drug use: No   Review of Systems Constitutional: Negative for fever. Cardiovascular: Negative for chest pain. Respiratory: Negative for shortness of breath. Gastrointestinal: Positive for rectal pain with bowel movement, rectal bleeding Skin: Negative for rash. Neurological: Negative for headaches, focal weakness or numbness.  All systems negative/normal/unremarkable except as stated in the HPI  ____________________________________________   PHYSICAL EXAM:  VITAL SIGNS: ED Triage Vitals  Enc Vitals Group     BP --      Pulse --      Resp --      Temp --      Temp src --      SpO2 --      Weight 02/24/18 1826 180 lb (81.6 kg)     Height 02/24/18 1826 5\' 6"  (1.676 m)     Head Circumference --      Peak Flow --      Pain Score 02/24/18 1825 0     Pain Loc --      Pain Edu? --      Excl. in [place]? --    Constitutional: Alert and  oriented. Well appearing and in no distress. Eyes: Conjunctivae are normal. Normal extraocular movements. Rectal: Anal fissure is palpated at 6:00 Musculoskeletal: Nontender with normal range of motion in extremities. No lower extremity tenderness nor edema. Neurologic:  Normal speech and language. No gross focal neurologic deficits are appreciated.  Skin:  Skin is warm, dry and intact. No rash noted. ____________________________________________  ED COURSE:  As part of my medical decision making, I reviewed the following data within  the [place] History obtained from family if available, nursing notes, old chart and ekg, as well as notes from prior ED visits. Patient presented for rectal pain and rectal bleeding, clinically this feels to be a rectal fissure at 6:00   Procedures ____________________________________________  DIFFERENTIAL DIAGNOSIS   Anal fissure, hemorrhoid, fistula  FINAL ASSESSMENT AND PLAN  Rectal bleeding   Plan: The patient had presented for rectal bleeding which seems to be coming from an anal fissure.  I will prescribe lidocaine jelly for him to use for topical anesthetic as well as nifedipine ointment.  He will be referred to GI for outpatient follow-up.   Laurence Aly, MD   Note: This note was generated in part or whole with voice recognition software. Voice recognition is usually quite accurate but there are transcription errors that can and very often do occur. I apologize for any typographical errors that were not detected and corrected.     Earleen Newport, MD 02/24/18 [phone]

## 2018-02-24 NOTE — ED Notes (Signed)
Patient to ED with complaints of blood streaked stool, and bright red blood on toilet tissue when wiping himself after a bowel movement. Patient denies large quantities of blood in the toilet or on the tissue. He denies abdominal pain, nausea or vomiting. Denies dark, tarry stools, as well as coffee ground emesis. Patient is an alert and oriented, pleasant gentleman who presents with his significant other who is at the bedside. Patient denies additional need for anything at this time as the EDP has already examined him.

## 2018-02-24 NOTE — ED Triage Notes (Signed)
Patient presents to the ED bright red rectal bleeding occasionally when he is having a bowel movement x 3 weeks.  Patient states he notices blood when he wipes and "around his stools".  Patient reports having hemorrhoids years ago.  Patient denies any weakness or dizziness.  Patient states, "I feel good."

## 2018-03-07 ENCOUNTER — Other Ambulatory Visit: Payer: Self-pay | Admitting: Nurse Practitioner

## 2018-03-07 MED ORDER — FLUTICASONE PROPIONATE 50 MCG/ACT NA SUSP: 1.0000 | Freq: Every day | NASAL | 3 refills | Status: DC

## 2018-03-19 ENCOUNTER — Ambulatory Visit: Payer: BLUE CROSS/BLUE SHIELD | Admitting: Nurse Practitioner

## 2018-03-19 ENCOUNTER — Encounter: Payer: Self-pay | Admitting: Nurse Practitioner

## 2018-03-19 VITALS — BP 118/81 | HR 71 | Resp 16 | Ht 66.0 in | Wt 186.0 lb

## 2018-03-19 DIAGNOSIS — Z0001 Encounter for general adult medical examination with abnormal findings: Secondary | ICD-10-CM

## 2018-03-19 DIAGNOSIS — I10 Essential (primary) hypertension: Secondary | ICD-10-CM

## 2018-03-19 DIAGNOSIS — Z125 Encounter for screening for malignant neoplasm of prostate: Secondary | ICD-10-CM | POA: Diagnosis not present

## 2018-03-19 DIAGNOSIS — I2510 Atherosclerotic heart disease of native coronary artery without angina pectoris: Secondary | ICD-10-CM | POA: Diagnosis not present

## 2018-03-19 DIAGNOSIS — R300 Dysuria: Secondary | ICD-10-CM

## 2018-03-19 MED ORDER — LISINOPRIL-HYDROCHLOROTHIAZIDE 10-12.5 MG PO TABS: 1.0000 | ORAL_TABLET | Freq: Every day | ORAL | 0 refills | Status: DC

## 2018-03-19 MED ORDER — METOPROLOL TARTRATE 25 MG PO TABS: 12.5000 mg | ORAL_TABLET | Freq: Every day | ORAL | 2 refills | Status: DC

## 2018-03-19 NOTE — Progress Notes (Signed)
[facility] [place], [place] [address]  Internal MEDICINE  Office Visit Note  Patient Name: Nicholas Torres  [id number]  [id number]  Date of Service: 04/10/2018  Pt is here for routine health maintenance examination   Chief Complaint  Patient presents with  . Annual Exam  . Hypertension     The patient is reporting no new concerns or complaints today. 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 He has had cardiac bypass surgery Hypertension  This is a chronic problem. The current episode started more than 1 year ago. The problem is unchanged. The problem is controlled. Pertinent negatives include no chest pain, headaches, neck pain or shortness of breath. There are no associated agents to hypertension. Risk factors for coronary artery disease include dyslipidemia and male gender. Past treatments include beta blockers, diuretics and ACE inhibitors. The current treatment provides moderate improvement. There are no compliance problems.  Hypertensive end-organ damage includes CAD/MI.     Current Medication: Outpatient Encounter Medications as of 03/19/2018  Medication Sig  . amoxicillin (AMOXIL) 875 MG tablet Take 1 tablet (875 mg total) by mouth 2 (two) times daily. (Patient not taking: Reported on 03/19/2018)  . aspirin EC 325 MG EC tablet Take 1 tablet (325 mg total) by mouth daily.  . chlorpheniramine-HYDROcodone (TUSSIONEX PENNKINETIC ER) 10-8 MG/5ML SUER Take 5 mLs by mouth every 12 (twelve) hours as needed for cough.  . fluticasone (FLONASE) 50 MCG/ACT nasal spray Place 1 spray into both nostrils daily.  . Lidocaine 2 % GEL Apply 1 application topically 3 (three) times daily as needed.  Marland Kitchen lisinopril-hydrochlorothiazide (PRINZIDE,ZESTORETIC) 10-12.5 MG tablet Take 1 tablet by mouth daily.  . metoprolol tartrate (LOPRESSOR) 25 MG tablet Take 0.5 tablets (12.5 mg total) by mouth daily.  . Nitroglycerin 0.4 % OINT Place 1 application  rectally 2 (two) times daily. Apply twice daily for 8 weeks  . rosuvastatin (CRESTOR) 10 MG tablet Take 10 mg by mouth daily.  . [DISCONTINUED] metoprolol tartrate (LOPRESSOR) 25 MG tablet Take 0.5 tablets (12.5 mg total) by mouth daily.   No facility-administered encounter medications on file as of 03/19/2018.     Surgical History: Past Surgical History:  Procedure Laterality Date  . CARDIAC CATHETERIZATION Right 07/24/2016   Procedure: Left Heart Cath and Coronary Angiography;  Surgeon: Dionisio David, MD;  Location: [place] [facility];  Service: Cardiovascular;  Laterality: Right;  . CORONARY ARTERY BYPASS GRAFT N/A 07/26/2016   Procedure: CORONARY ARTERY BYPASS GRAFTING (CABG), ON PUMP, TIMES FOUR, USING BILATERAL INTERNAL MAMMARY ARTERIES, RIGHT GREATER SAPHENOUS VEIN HARVESTED ENDOSCOPICALLY;  Surgeon: Melrose Nakayama, MD;  Location: [place];  Service: Open Heart Surgery;  Laterality: N/A;  . RADIAL ARTERY HARVEST Left 07/26/2016   Procedure: RADIAL LEFT ARTERY HARVEST;  Surgeon: Melrose Nakayama, MD;  Location: [place];  Service: Open Heart Surgery;  Laterality: Left;  . TEE WITHOUT CARDIOVERSION N/A 07/26/2016   Procedure: TRANSESOPHAGEAL ECHOCARDIOGRAM (TEE);  Surgeon: Melrose Nakayama, MD;  Location: [place];  Service: Open Heart Surgery;  Laterality: N/A;    Medical History: Past Medical History:  Diagnosis Date  . CAD (coronary artery disease)   . High cholesterol   . Hypertension   . Pollen allergy     Family History: Family History  Problem Relation Age of Onset  . Diabetes Mother   . Heart attack Father   . CAD Brother       Review of Systems  Constitutional: Negative  for chills, fatigue and unexpected weight change.  HENT: Negative for congestion, postnasal drip, rhinorrhea, sneezing and sore throat.   Eyes: Negative.  Negative for redness.  Respiratory: Negative for cough, chest tightness, shortness of breath and wheezing.   Cardiovascular: Negative for  chest pain.  Gastrointestinal: Negative for abdominal pain, constipation, diarrhea, nausea and vomiting.  Endocrine: Negative for cold intolerance, heat intolerance, polydipsia, polyphagia and polyuria.  Genitourinary: Negative for dysuria and frequency.  Musculoskeletal: Negative for arthralgias, back pain, joint swelling and neck pain.  Skin: Negative for rash.  Allergic/Immunologic: Negative for environmental allergies.  Neurological: Negative for dizziness, tremors, numbness and headaches.  Hematological: Negative for adenopathy. Does not bruise/bleed easily.  Psychiatric/Behavioral: Negative for behavioral problems (Depression), sleep disturbance and suicidal ideas. The patient is not nervous/anxious.    Today's Vitals   03/19/18 1010  BP: 118/81  Pulse: 71  Resp: 16  SpO2: 99%  Weight: 186 lb (84.4 kg)  Height: 5\' 6"  (1.676 m)      Physical Exam  Constitutional: He is oriented to person, place, and time. He appears well-developed and well-nourished. No distress.  HENT:  Head: Normocephalic and atraumatic.  Nose: Nose normal.  Mouth/Throat: Oropharynx is clear and moist. No oropharyngeal exudate.  Eyes: Pupils are equal, round, and reactive to light. Conjunctivae and EOM are normal.  Neck: Normal range of motion. Neck supple. No JVD present. Carotid bruit is not present. No tracheal deviation present. No thyromegaly present.  Cardiovascular: Normal rate, regular rhythm, normal heart sounds and intact distal pulses. Exam reveals no gallop and no friction rub.  No murmur heard. Pulmonary/Chest: Effort normal and breath sounds normal. No respiratory distress. He has no wheezes. He has no rales. He exhibits no tenderness.  Abdominal: Soft. Bowel sounds are normal. There is no tenderness.  Musculoskeletal: Normal range of motion.  Lymphadenopathy:    He has no cervical adenopathy.  Neurological: He is alert and oriented to person, place, and time. No cranial nerve deficit.   Skin: Skin is warm and dry. Capillary refill takes less than 2 seconds. He is not diaphoretic.  Psychiatric: He has a normal mood and affect. His behavior is normal. Judgment and thought content normal.  Nursing note and vitals reviewed.    LABS: Recent Results (from the past 2160 hour(s))  Urinalysis, Routine w reflex microscopic     Status: None   Collection Time: 03/19/18  4:13 PM  Result Value Ref Range   Specific Gravity, UA 1.018 1.005 - 1.030   pH, UA 5.0 5.0 - 7.5   Color, UA Yellow Yellow   Appearance Ur Clear Clear   Leukocytes, UA Negative Negative   Protein, UA Negative Negative/Trace   Glucose, UA Negative Negative   Ketones, UA Negative Negative   RBC, UA Negative Negative   Bilirubin, UA Negative Negative   Urobilinogen, Ur 0.2 0.2 - 1.0 mg/dL   Nitrite, UA Negative Negative   Microscopic Examination Comment     Comment: Microscopic not indicated and not performed.    Assessment/Plan:  1. Encounter for general adult medical examination with abnormal findings Annual wellness visit today. Routine, fasting labs ordered.  - CBC with Differential/Platelet - Comprehensive metabolic panel - Lipid panel - TSH  2. Essential hypertension Stable. Continue bp medication as prescribed.  - CBC with Differential/Platelet - Comprehensive metabolic panel - Lipid panel - TSH - metoprolol tartrate (LOPRESSOR) 25 MG tablet; Take 0.5 tablets (12.5 mg total) by mouth daily.  Dispense: 90 tablet; Refill: 2 -  lisinopril-hydrochlorothiazide (PRINZIDE,ZESTORETIC) 10-12.5 MG tablet; Take 1 tablet by mouth daily.  Dispense: 90 tablet; Refill: 0  3. Coronary artery disease involving native coronary artery of native heart without angina pectoris Doing well. Continue regular visits to cardiology as scheduled.   4. Screening for prostate cancer - PSA  5. Dysuria - Urinalysis, Routine w reflex microscopic   General Counseling: Kayn verbalizes understanding of the findings of  todays visit and agrees with plan of treatment. I have discussed any further diagnostic evaluation that may be needed or ordered today. We also reviewed his medications today. he has been encouraged to call the office with any questions or concerns that should arise related to todays visit.  Hypertension Counseling:   The following hypertensive lifestyle modification were recommended and discussed:  1. Limiting alcohol intake to less than 1 oz/day of ethanol:(24 oz of beer or 8 oz of wine or 2 oz of 100-proof whiskey). 2. Take baby ASA 81 mg daily. 3. Importance of regular aerobic exercise and losing weight. 4. Reduce dietary saturated fat and cholesterol intake for overall cardiovascular health. 5. Maintaining adequate dietary potassium, calcium, and magnesium intake. 6. Regular monitoring of the blood pressure. 7. Reduce sodium intake to less than 100 mmol/day (less than 2.3 gm of sodium or less than 6 gm of sodium choride)   This patient was seen by Leretha Pol, FNP- C in Collaboration with Dr Lavera Guise as a part of collaborative care agreement   Orders Placed This Encounter  Procedures  . PSA  . CBC with Differential/Platelet  . Comprehensive metabolic panel  . Lipid panel  . TSH  . Urinalysis, Routine w reflex microscopic    Meds ordered this encounter  Medications  . metoprolol tartrate (LOPRESSOR) 25 MG tablet    Sig: Take 0.5 tablets (12.5 mg total) by mouth daily.    Dispense:  90 tablet    Refill:  2    Order Specific Question:   Supervising Provider    Answer:   Lavera Guise [[id number]]  . lisinopril-hydrochlorothiazide (PRINZIDE,ZESTORETIC) 10-12.5 MG tablet    Sig: Take 1 tablet by mouth daily.    Dispense:  90 tablet    Refill:  0    Order Specific Question:   Supervising Provider    Answer:   Lavera Guise [[id number]]    Time spent: Baker, MD  Internal Medicine

## 2018-03-20 LAB — URINALYSIS, ROUTINE W REFLEX MICROSCOPIC
BILIRUBIN UA: NEGATIVE
GLUCOSE, UA: NEGATIVE
KETONES UA: NEGATIVE
LEUKOCYTES UA: NEGATIVE
Nitrite, UA: NEGATIVE
PROTEIN UA: NEGATIVE
RBC UA: NEGATIVE
SPEC GRAV UA: 1.018 (ref 1.005–1.030)
Urobilinogen, Ur: 0.2 mg/dL (ref 0.2–1.0)
pH, UA: 5 (ref 5.0–7.5)

## 2018-03-29 ENCOUNTER — Encounter: Payer: Self-pay | Admitting: Nurse Practitioner

## 2018-04-10 DIAGNOSIS — I10 Essential (primary) hypertension: Secondary | ICD-10-CM | POA: Insufficient documentation

## 2018-04-10 DIAGNOSIS — Z1211 Encounter for screening for malignant neoplasm of colon: Secondary | ICD-10-CM | POA: Insufficient documentation

## 2018-04-10 DIAGNOSIS — R300 Dysuria: Secondary | ICD-10-CM | POA: Insufficient documentation

## 2018-09-03 IMAGING — CT CT HEAD W/O CM
3 series · 15 of 43 positions shown, 18 images · non-contrast
Comparison: None.

CLINICAL DATA: Seizure-like activity this morning. Acute syncopal
episode. Concern for bleed.

EXAM:
CT HEAD WITHOUT CONTRAST
TECHNIQUE: Contiguous axial images were obtained from the base of the skull
through the vertex without intravenous contrast.

[Series 2: head wo · axial · 0.39mm/px · z∈[-28,+77]mm · 9 of 26 slices shown, 12 images]
[im 3/26  brain]
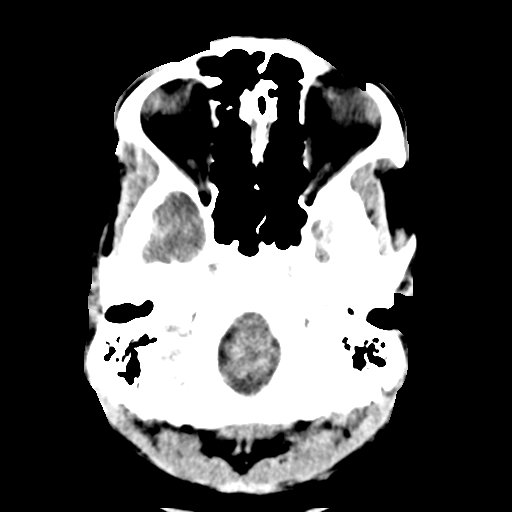
[im 3/26  bone]
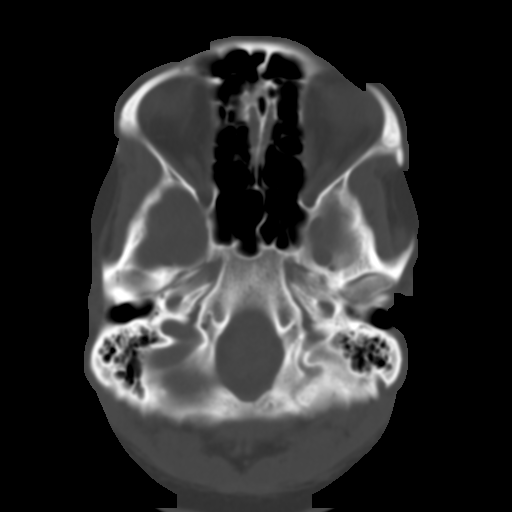
[im 6/26  brain]
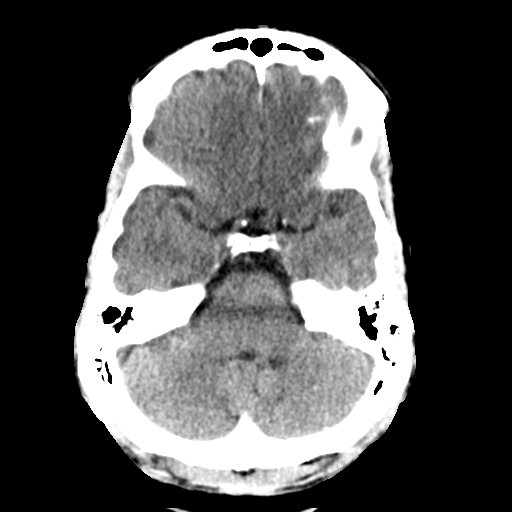
[im 8/26  brain]
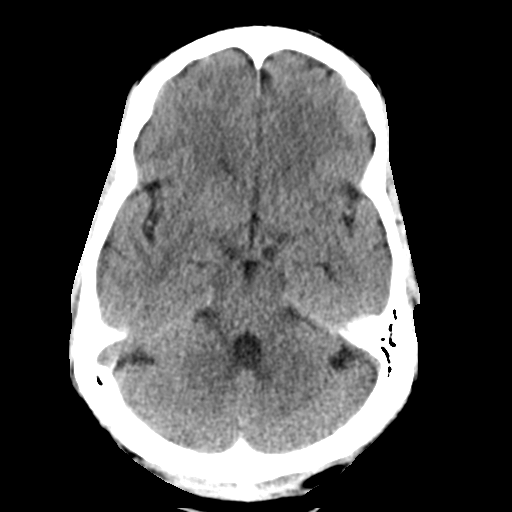
[im 11/26  brain]
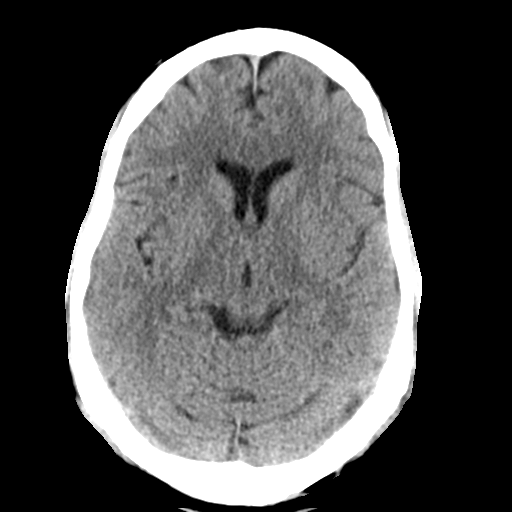
[im 14/26  brain]
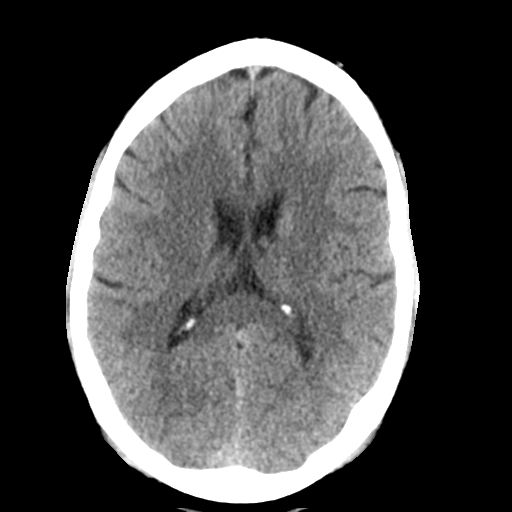
[im 14/26  bone]
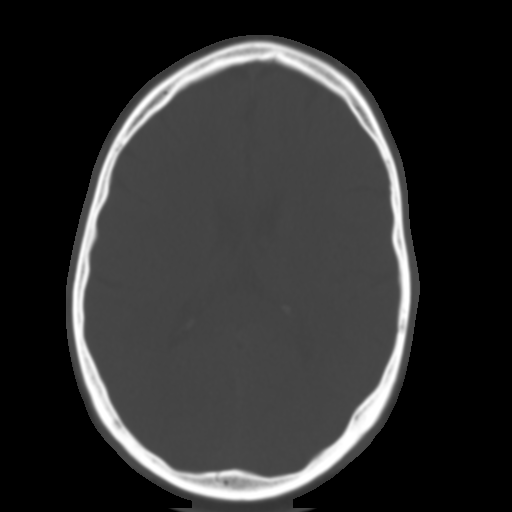
[im 16/26  brain]
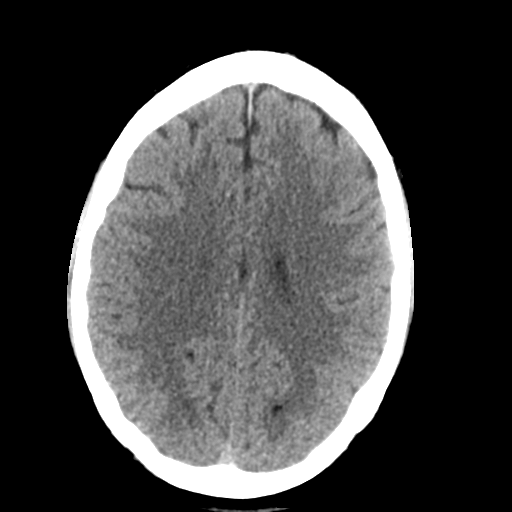
[im 19/26  brain]
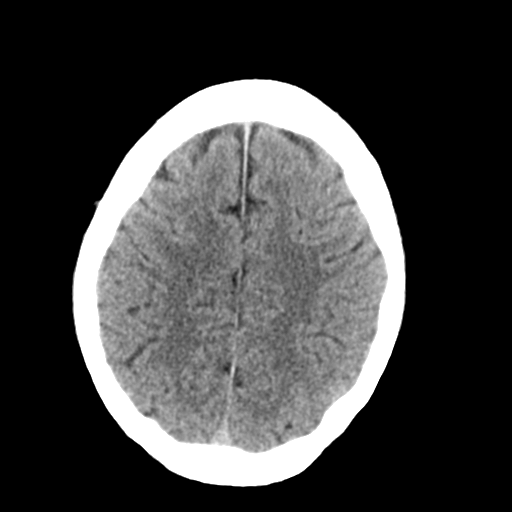
[im 22/26  brain]
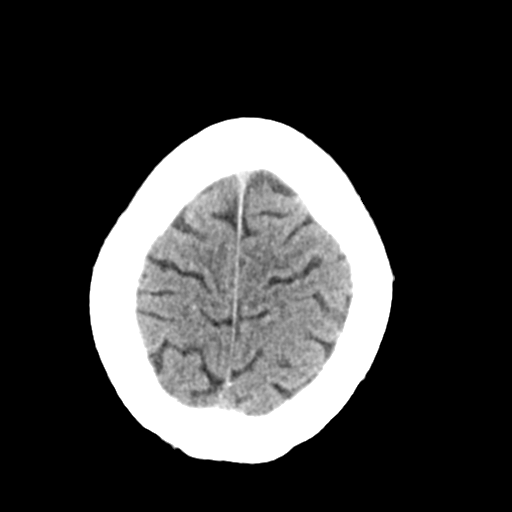
[im 24/26  brain]
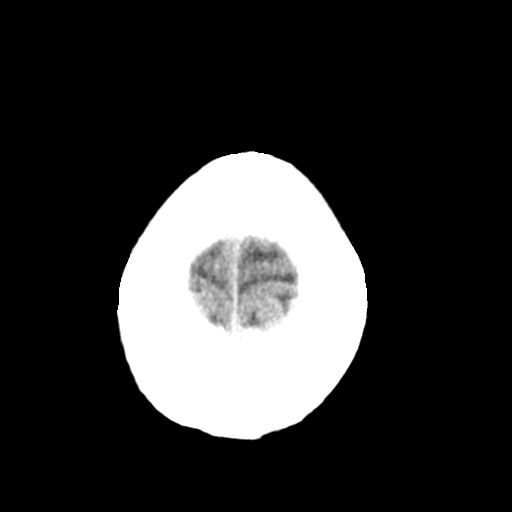
[im 24/26  bone]
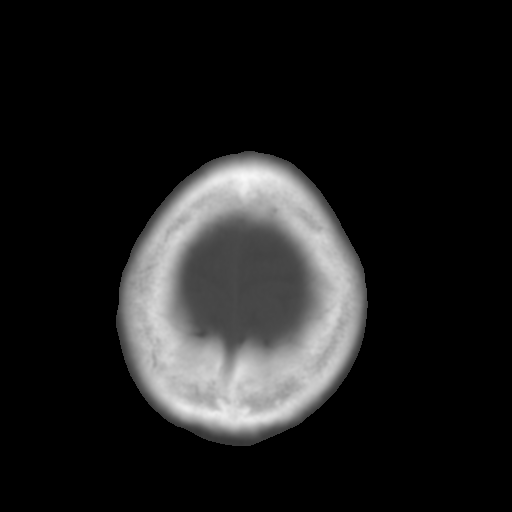

[Series 4: coronal soft tissue · coronal · 0.29mm/px · 3 of 63 slices shown]
[im 21/63  brain]
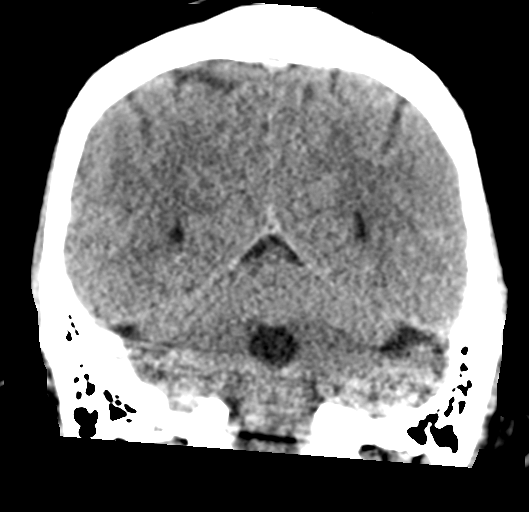
[im 28/63  brain]
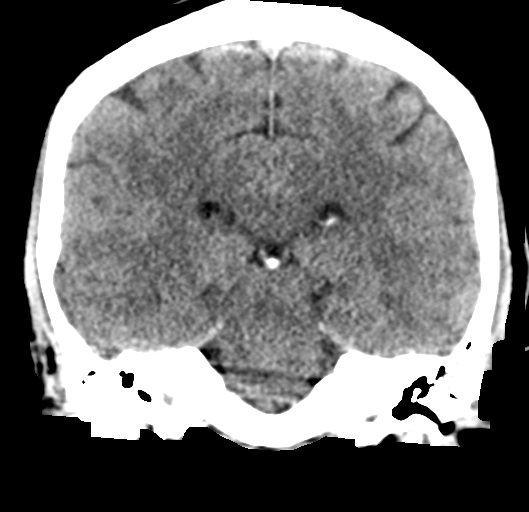
[im 35/63  brain]
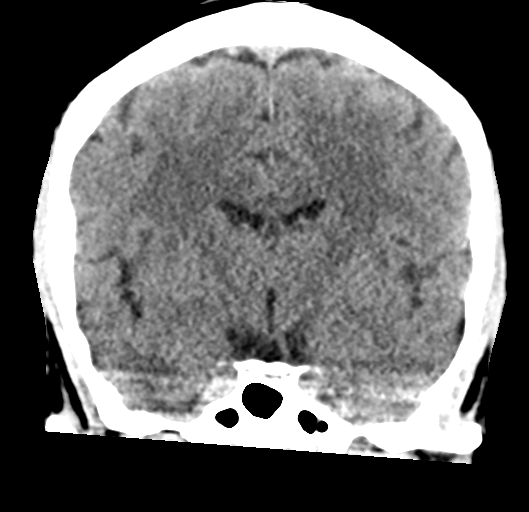

[Series 5: sagittal soft tissue · sagittal · 0.28mm/px · 3 of 49 slices shown]
[im 17/49  brain]
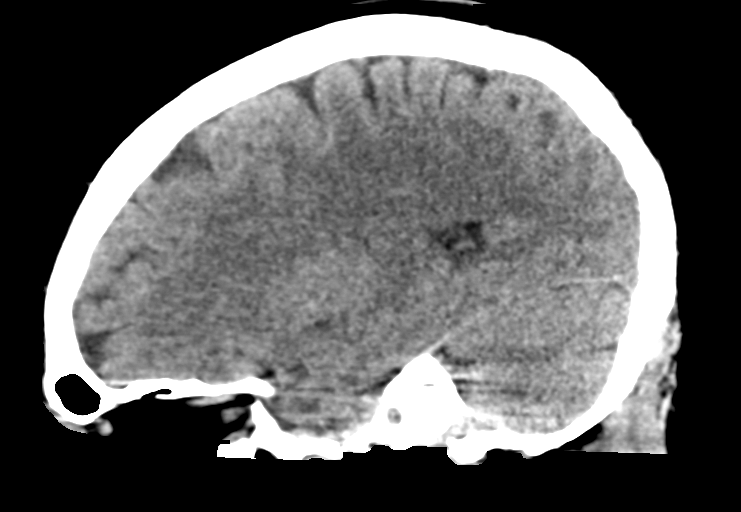
[im 25/49  brain]
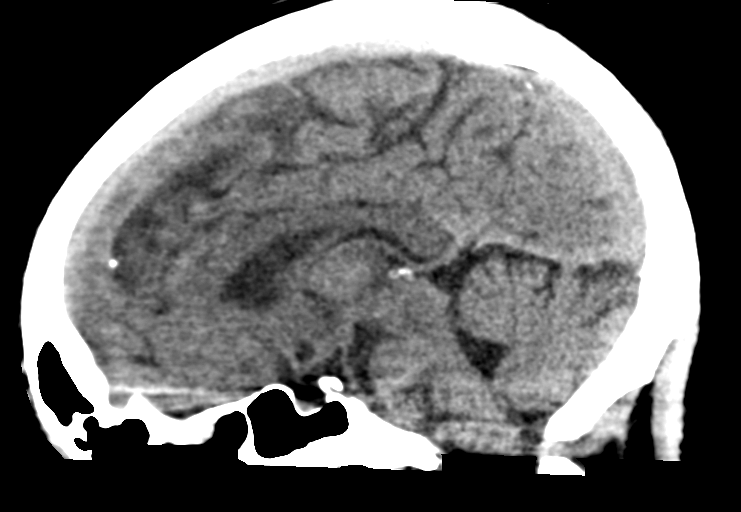
[im 33/49  brain]
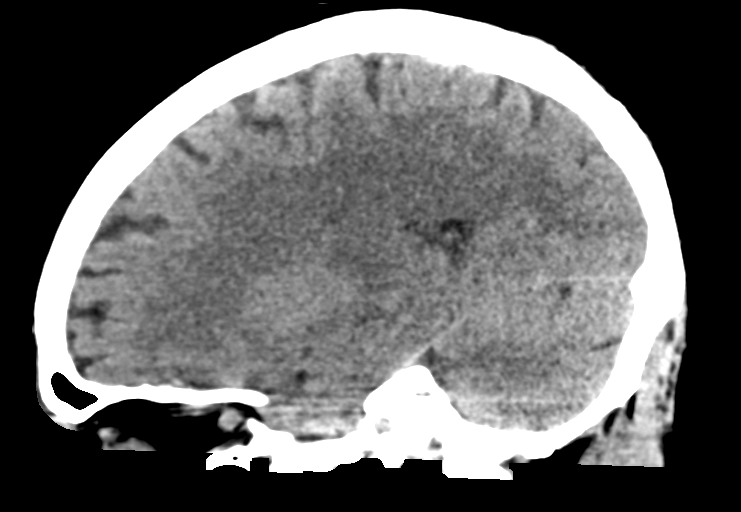

[15 of 43 positions shown; findings below may reference images not displayed]

FINDINGS: Brain: No evidence for acute hemorrhage, mass lesion, midline shift,
hydrocephalus or large infarct. Two or three punctate hypodensities
in the occipital areas are nonspecific.

Vascular: No hyperdense vessel or unexpected calcification.

Skull: Negative

Sinuses/Orbits: Visualized sinuses are clear.

Other: None.
IMPRESSION: No acute intracranial abnormality.

## 2018-10-01 ENCOUNTER — Ambulatory Visit: Payer: Self-pay | Admitting: Nurse Practitioner

## 2018-10-07 ENCOUNTER — Encounter: Payer: Self-pay | Admitting: Nurse Practitioner

## 2018-10-07 ENCOUNTER — Ambulatory Visit: Payer: BLUE CROSS/BLUE SHIELD | Admitting: Nurse Practitioner

## 2018-10-07 VITALS — BP 117/89 | HR 67 | Resp 16 | Ht 66.0 in | Wt 188.0 lb

## 2018-10-07 DIAGNOSIS — K921 Melena: Secondary | ICD-10-CM

## 2018-10-07 DIAGNOSIS — K581 Irritable bowel syndrome with constipation: Secondary | ICD-10-CM | POA: Diagnosis not present

## 2018-10-07 DIAGNOSIS — I10 Essential (primary) hypertension: Secondary | ICD-10-CM | POA: Diagnosis not present

## 2018-10-07 DIAGNOSIS — I2510 Atherosclerotic heart disease of native coronary artery without angina pectoris: Secondary | ICD-10-CM

## 2018-10-07 DIAGNOSIS — Z1211 Encounter for screening for malignant neoplasm of colon: Secondary | ICD-10-CM | POA: Diagnosis not present

## 2018-10-07 MED ORDER — LUBIPROSTONE 8 MCG PO CAPS: 8.0000 ug | ORAL_CAPSULE | Freq: Two times a day (BID) | ORAL | 2 refills | Status: DC

## 2018-10-07 NOTE — Progress Notes (Signed)
[facility] [place], [place] [address]  Internal MEDICINE  Office Visit Note  Patient Name: Nicholas Torres  [id number]  [id number]  Date of Service: 10/07/2018  Chief Complaint  Patient presents with  . Medical Management of Chronic Issues    20month follow up  . Hypertension  . Hyperlipidemia  . Constipation    pt has been have hard stool with blood in it, pt went to the ER about 6 months ago for the same symptoms, they gave him medication and he thinks he may not have used it properly. constipation comes and goes    The patient is here for routine follow up exam. Today, he is complaining of significant constipation. Feels like he has trouble with his rectum opening up, letting him have a normal bowel movement. About 6 months ago, he was actually seen in ER due to rectal pain and blood in the stool. Was given some cream to help with the pain of rectal fissure. This resolved, however, will intermittently return. Was given referral information for GI provider along with a 1 week follow up, however, the patient did not follow up with this provider. States that he has not changed anything in his diet. States that he may not drink enough water. Does not take stool softeners or fiver supplement. Denies abdominal pain. Last bowel movement was this morning. Was painful coming out. Did have some blood on the stool and on the tissue when he wiped.       Current Medication: Outpatient Encounter Medications as of 10/07/2018  Medication Sig  . amoxicillin (AMOXIL) 875 MG tablet Take 1 tablet (875 mg total) by mouth 2 (two) times daily. (Patient not taking: Reported on 03/19/2018)  . aspirin EC 325 MG EC tablet Take 1 tablet (325 mg total) by mouth daily.  . chlorpheniramine-HYDROcodone (TUSSIONEX PENNKINETIC ER) 10-8 MG/5ML SUER Take 5 mLs by mouth every 12 (twelve) hours as needed for cough.  . fluticasone (FLONASE) 50 MCG/ACT nasal spray Place 1 spray into both nostrils  daily.  . Lidocaine 2 % GEL Apply 1 application topically 3 (three) times daily as needed.  Marland Kitchen lisinopril-hydrochlorothiazide (PRINZIDE,ZESTORETIC) 10-12.5 MG tablet Take 1 tablet by mouth daily.  Marland Kitchen lubiprostone (AMITIZA) 8 MCG capsule Take 1 capsule (8 mcg total) by mouth 2 (two) times daily with a meal.  . metoprolol tartrate (LOPRESSOR) 25 MG tablet Take 0.5 tablets (12.5 mg total) by mouth daily.  . Nitroglycerin 0.4 % OINT Place 1 application rectally 2 (two) times daily. Apply twice daily for 8 weeks  . rosuvastatin (CRESTOR) 10 MG tablet Take 10 mg by mouth daily.   No facility-administered encounter medications on file as of 10/07/2018.     Surgical History: Past Surgical History:  Procedure Laterality Date  . CARDIAC CATHETERIZATION Right 07/24/2016   Procedure: Left Heart Cath and Coronary Angiography;  Surgeon: Dionisio David, MD;  Location: [facility];  Service: Cardiovascular;  Laterality: Right;  . CORONARY ARTERY BYPASS GRAFT N/A 07/26/2016   Procedure: CORONARY ARTERY BYPASS GRAFTING (CABG), ON PUMP, TIMES FOUR, USING BILATERAL INTERNAL MAMMARY ARTERIES, RIGHT GREATER SAPHENOUS VEIN HARVESTED ENDOSCOPICALLY;  Surgeon: Melrose Nakayama, MD;  Location: [facility];  Service: Open Heart Surgery;  Laterality: N/A;  . RADIAL ARTERY HARVEST Left 07/26/2016   Procedure: RADIAL LEFT ARTERY HARVEST;  Surgeon: Melrose Nakayama, MD;  Location: [facility];  Service: Open Heart Surgery;  Laterality: Left;  . TEE WITHOUT CARDIOVERSION N/A 07/26/2016   Procedure:  TRANSESOPHAGEAL ECHOCARDIOGRAM (TEE);  Surgeon: Melrose Nakayama, MD;  Location: [facility];  Service: Open Heart Surgery;  Laterality: N/A;    Medical History: Past Medical History:  Diagnosis Date  . CAD (coronary artery disease)   . High cholesterol   . Hypertension   . Pollen allergy     Family History: Family History  Problem Relation Age of Onset  . Diabetes Mother   . Heart attack Father   . CAD Brother      Social History   Socioeconomic History  . Marital status: Married    Spouse name: Not on file  . Number of children: Not on file  . Years of education: Not on file  . Highest education level: Not on file  Occupational History  . Not on file  Social Needs  . Financial resource strain: Not on file  . Food insecurity:    Worry: Not on file    Inability: Not on file  . Transportation needs:    Medical: Not on file    Non-medical: Not on file  Tobacco Use  . Smoking status: Never Smoker  . Smokeless tobacco: Never Used  Substance and Sexual Activity  . Alcohol use: No  . Drug use: No  . Sexual activity: Yes  Lifestyle  . Physical activity:    Days per week: Not on file    Minutes per session: Not on file  . Stress: Not on file  Relationships  . Social connections:    Talks on phone: Not on file    Gets together: Not on file    Attends religious service: Not on file    Active member of club or organization: Not on file    Attends meetings of clubs or organizations: Not on file    Relationship status: Not on file  . Intimate partner violence:    Fear of current or ex partner: Not on file    Emotionally abused: Not on file    Physically abused: Not on file    Forced sexual activity: Not on file  Other Topics Concern  . Not on file  Social History Narrative  . Not on file      Review of Systems  Constitutional: Negative for activity change, chills, fatigue and unexpected weight change.  HENT: Negative for congestion, postnasal drip, rhinorrhea, sneezing and sore throat.   Respiratory: Negative for cough, chest tightness, shortness of breath and wheezing.   Cardiovascular: Negative for chest pain and palpitations.  Gastrointestinal: Positive for blood in stool, constipation and rectal pain. Negative for abdominal pain, diarrhea, nausea and vomiting.  Endocrine: Negative for cold intolerance, heat intolerance, polydipsia and polyphagia.  Skin: Negative for rash.   Allergic/Immunologic: Negative for environmental allergies.  Neurological: Negative for dizziness, tremors, numbness and headaches.  Hematological: Negative for adenopathy. Does not bruise/bleed easily.  Psychiatric/Behavioral: Negative for behavioral problems (Depression), sleep disturbance and suicidal ideas. The patient is not nervous/anxious.     Vital Signs: BP 117/89 (BP Location: Right Arm, Patient Position: Sitting, Cuff Size: Large)   Pulse 67   Resp 16   Ht 5\' 6"  (1.676 m)   Wt 188 lb (85.3 kg)   SpO2 99%   BMI 30.34 kg/m    Physical Exam  Constitutional: He is oriented to person, place, and time. He appears well-developed and well-nourished. No distress.  HENT:  Head: Normocephalic and atraumatic.  Nose: Nose normal.  Mouth/Throat: No oropharyngeal exudate.  Eyes: Pupils are equal, round, and reactive to  light. Conjunctivae and EOM are normal.  Neck: Normal range of motion. Neck supple. No JVD present. No tracheal deviation present. No thyromegaly present.  Cardiovascular: Normal rate, regular rhythm and normal heart sounds. Exam reveals no gallop and no friction rub.  No murmur heard. Pulmonary/Chest: Effort normal and breath sounds normal. No respiratory distress. He has no wheezes. He has no rales. He exhibits no tenderness.  Abdominal: Soft. Bowel sounds are normal. There is no tenderness.  Musculoskeletal: Normal range of motion.  Lymphadenopathy:    He has no cervical adenopathy.  Neurological: He is alert and oriented to person, place, and time. No cranial nerve deficit.  Skin: Skin is warm and dry. He is not diaphoretic.  Psychiatric: He has a normal mood and affect. His behavior is normal. Judgment and thought content normal.  Nursing note and vitals reviewed.  Assessment/Plan: 1. Essential hypertension Stable. Continue bp medications as prescribed. Reordered routine, fasting labs for patient to do prior to next visit   2. Irritable bowel syndrome with  constipation Add amitiza 32mcg twice daily. Samples provided today as well as prescription sent to pharmacy. Advised him to increase water in diet and recommended he try stool softeners daily. Will refer to GI provider if no improvement over next few weeks.  - lubiprostone (AMITIZA) 8 MCG capsule; Take 1 capsule (8 mcg total) by mouth 2 (two) times daily with a meal.  Dispense: 60 capsule; Refill: 2  3. Blood in the stool Bright red blood in stool. Will check stool for occult blood.   4. Screening for colon cancer FOBT ordered.   5. Coronary artery disease involving native coronary artery of native heart without angina pectoris Regular visits with cardiology as scheduled.   General Counseling: Brock verbalizes understanding of the findings of todays visit and agrees with plan of treatment. I have discussed any further diagnostic evaluation that may be needed or ordered today. We also reviewed his medications today. he has been encouraged to call the office with any questions or concerns that should arise related to todays visit.  A high fiber diet with plenty of fluids (up to 8 glasses of water daily) is suggested to relieve these symptoms.  Metamucil, 1 tablespoon once or twice daily can be used to keep bowels regular if needed.  This patient was seen by [place] with Dr Lavera Guise as a part of collaborative care agreement  Meds ordered this encounter  Medications  . lubiprostone (AMITIZA) 8 MCG capsule    Sig: Take 1 capsule (8 mcg total) by mouth 2 (two) times daily with a meal.    Dispense:  60 capsule    Refill:  2    Order Specific Question:   Supervising Provider    Answer:   Lavera Guise [[id number]]    Time spent: 73 Minutes      Dr Lavera Guise Internal medicine

## 2018-11-04 ENCOUNTER — Telehealth: Payer: Self-pay

## 2018-11-04 NOTE — Telephone Encounter (Signed)
We do not. You will have to put it on a prescription pad. Thanks.

## 2018-11-04 NOTE — Telephone Encounter (Signed)
Do we have slips for [facility]?

## 2018-11-04 NOTE — Telephone Encounter (Signed)
Pt just told me that he needs rx for his stool sample.

## 2018-11-04 NOTE — Telephone Encounter (Signed)
RECEIVED BOTH RX AND PUT THEM IN AN ENVELOPE AT THE FRONT. PT WIFE WILL PICK THEM UP.

## 2018-11-04 NOTE — Telephone Encounter (Signed)
I have prescription for labs I am going to bring you.

## 2018-11-15 ENCOUNTER — Encounter: Payer: Self-pay | Admitting: Adult Health

## 2018-11-15 ENCOUNTER — Ambulatory Visit: Payer: BC Managed Care – PPO | Admitting: Adult Health

## 2018-11-15 ENCOUNTER — Ambulatory Visit: Payer: Self-pay | Admitting: Adult Health

## 2018-11-15 VITALS — BP 122/91 | HR 47 | Resp 16 | Ht 66.0 in | Wt 188.6 lb

## 2018-11-15 DIAGNOSIS — K581 Irritable bowel syndrome with constipation: Secondary | ICD-10-CM

## 2018-11-15 DIAGNOSIS — I10 Essential (primary) hypertension: Secondary | ICD-10-CM

## 2018-11-15 DIAGNOSIS — K921 Melena: Secondary | ICD-10-CM

## 2018-11-15 DIAGNOSIS — I2510 Atherosclerotic heart disease of native coronary artery without angina pectoris: Secondary | ICD-10-CM

## 2018-11-15 MED ORDER — LUBIPROSTONE 8 MCG PO CAPS: 8.0000 ug | ORAL_CAPSULE | Freq: Two times a day (BID) | ORAL | 3 refills | Status: DC

## 2018-11-15 NOTE — Progress Notes (Signed)
[facility] [place], [place] [address]  Internal MEDICINE  Office Visit Note  Patient Name: Nicholas Torres  [id number]  [id number]  Date of Service: 11/17/2018  Chief Complaint  Patient presents with  . Follow-up    stool labs    HPI Pt is here for follow up on stool cultures follow up.  His cultures were normal, and he reports some intermittent constipation.   He is seeing good results using the Charlett Blake and reports that he has connected some things in his diet to the days when he has more trouble having a bowel movement.    Current Medication: Outpatient Encounter Medications as of 11/15/2018  Medication Sig  . aspirin EC 325 MG EC tablet Take 1 tablet (325 mg total) by mouth daily.  . chlorpheniramine-HYDROcodone (TUSSIONEX PENNKINETIC ER) 10-8 MG/5ML SUER Take 5 mLs by mouth every 12 (twelve) hours as needed for cough.  . fluticasone (FLONASE) 50 MCG/ACT nasal spray Place 1 spray into both nostrils daily.  . Lidocaine 2 % GEL Apply 1 application topically 3 (three) times daily as needed.  Marland Kitchen lisinopril-hydrochlorothiazide (PRINZIDE,ZESTORETIC) 10-12.5 MG tablet Take 1 tablet by mouth daily.  Marland Kitchen lubiprostone (AMITIZA) 8 MCG capsule Take 1 capsule (8 mcg total) by mouth 2 (two) times daily with a meal.  . metoprolol tartrate (LOPRESSOR) 25 MG tablet Take 0.5 tablets (12.5 mg total) by mouth daily.  . Nitroglycerin 0.4 % OINT Place 1 application rectally 2 (two) times daily. Apply twice daily for 8 weeks  . rosuvastatin (CRESTOR) 10 MG tablet Take 10 mg by mouth daily.  . [DISCONTINUED] lubiprostone (AMITIZA) 8 MCG capsule Take 1 capsule (8 mcg total) by mouth 2 (two) times daily with a meal.  . amoxicillin (AMOXIL) 875 MG tablet Take 1 tablet (875 mg total) by mouth 2 (two) times daily. (Patient not taking: Reported on 03/19/2018)   No facility-administered encounter medications on file as of 11/15/2018.     Surgical History: Past Surgical History:   Procedure Laterality Date  . CARDIAC CATHETERIZATION Right 07/24/2016   Procedure: Left Heart Cath and Coronary Angiography;  Surgeon: Dionisio David, MD;  Location: [facility];  Service: Cardiovascular;  Laterality: Right;  . CORONARY ARTERY BYPASS GRAFT N/A 07/26/2016   Procedure: CORONARY ARTERY BYPASS GRAFTING (CABG), ON PUMP, TIMES FOUR, USING BILATERAL INTERNAL MAMMARY ARTERIES, RIGHT GREATER SAPHENOUS VEIN HARVESTED ENDOSCOPICALLY;  Surgeon: Melrose Nakayama, MD;  Location: [facility];  Service: Open Heart Surgery;  Laterality: N/A;  . RADIAL ARTERY HARVEST Left 07/26/2016   Procedure: RADIAL LEFT ARTERY HARVEST;  Surgeon: Melrose Nakayama, MD;  Location: [facility];  Service: Open Heart Surgery;  Laterality: Left;  . TEE WITHOUT CARDIOVERSION N/A 07/26/2016   Procedure: TRANSESOPHAGEAL ECHOCARDIOGRAM (TEE);  Surgeon: Melrose Nakayama, MD;  Location: [facility];  Service: Open Heart Surgery;  Laterality: N/A;    Medical History: Past Medical History:  Diagnosis Date  . CAD (coronary artery disease)   . High cholesterol   . Hypertension   . Pollen allergy     Family History: Family History  Problem Relation Age of Onset  . Diabetes Mother   . Heart attack Father   . CAD Brother     Social History   Socioeconomic History  . Marital status: Married    Spouse name: Not on file  . Number of children: Not on file  . Years of education: Not on file  . Highest education level: Not on file  Occupational History  . Not on file  Social Needs  . Financial resource strain: Not on file  . Food insecurity:    Worry: Not on file    Inability: Not on file  . Transportation needs:    Medical: Not on file    Non-medical: Not on file  Tobacco Use  . Smoking status: Never Smoker  . Smokeless tobacco: Never Used  Substance and Sexual Activity  . Alcohol use: No  . Drug use: No  . Sexual activity: Yes  Lifestyle  . Physical activity:    Days per week: Not on file     Minutes per session: Not on file  . Stress: Not on file  Relationships  . Social connections:    Talks on phone: Not on file    Gets together: Not on file    Attends religious service: Not on file    Active member of club or organization: Not on file    Attends meetings of clubs or organizations: Not on file    Relationship status: Not on file  . Intimate partner violence:    Fear of current or ex partner: Not on file    Emotionally abused: Not on file    Physically abused: Not on file    Forced sexual activity: Not on file  Other Topics Concern  . Not on file  Social History Narrative  . Not on file      Review of Systems  Constitutional: Negative.  Negative for chills, fatigue and unexpected weight change.  HENT: Negative.  Negative for congestion, rhinorrhea, sneezing and sore throat.   Eyes: Negative for redness.  Respiratory: Negative.  Negative for cough, chest tightness and shortness of breath.   Cardiovascular: Negative.  Negative for chest pain and palpitations.  Gastrointestinal: Negative.  Negative for abdominal pain, constipation, diarrhea, nausea and vomiting.  Endocrine: Negative.   Genitourinary: Negative.  Negative for dysuria and frequency.  Musculoskeletal: Negative.  Negative for arthralgias, back pain, joint swelling and neck pain.  Skin: Negative.  Negative for rash.  Allergic/Immunologic: Negative.   Neurological: Negative.  Negative for tremors and numbness.  Hematological: Negative for adenopathy. Does not bruise/bleed easily.  Psychiatric/Behavioral: Negative.  Negative for behavioral problems, sleep disturbance and suicidal ideas. The patient is not nervous/anxious.     Vital Signs: BP (!) 122/91   Pulse (!) 47   Resp 16   Ht 5\' 6"  (1.676 m)   Wt 188 lb 9.6 oz (85.5 kg)   SpO2 99%   BMI 30.44 kg/m    Physical Exam Vitals signs and nursing note reviewed.  Constitutional:      General: He is not in acute distress.    Appearance: He is  well-developed. He is not diaphoretic.  HENT:     Head: Normocephalic and atraumatic.     Mouth/Throat:     Pharynx: No oropharyngeal exudate.  Eyes:     Pupils: Pupils are equal, round, and reactive to light.  Neck:     Musculoskeletal: Normal range of motion and neck supple.     Thyroid: No thyromegaly.     Vascular: No JVD.     Trachea: No tracheal deviation.  Cardiovascular:     Rate and Rhythm: Normal rate and regular rhythm.     Heart sounds: Normal heart sounds. No murmur. No friction rub. No gallop.   Pulmonary:     Effort: Pulmonary effort is normal. No respiratory distress.     Breath sounds: Normal breath  sounds. No wheezing or rales.  Chest:     Chest wall: No tenderness.  Abdominal:     Palpations: Abdomen is soft.     Tenderness: There is no abdominal tenderness. There is no guarding.  Musculoskeletal: Normal range of motion.  Lymphadenopathy:     Cervical: No cervical adenopathy.  Skin:    General: Skin is warm and dry.  Neurological:     Mental Status: He is alert and oriented to person, place, and time.     Cranial Nerves: No cranial nerve deficit.  Psychiatric:        Behavior: Behavior normal.        Thought Content: Thought content normal.        Judgment: Judgment normal.    Assessment/Plan: 1. Irritable bowel syndrome with constipation Patient will continue to take Amitiza for constipation symptoms. - lubiprostone (AMITIZA) 8 MCG capsule; Take 1 capsule (8 mcg total) by mouth 2 (two) times daily with a meal.  Dispense: 60 capsule; Refill: 3  2. Essential hypertension Blood pressure initially elevated at this visit.  Repeat blood pressure 128/82.  Continue to monitor at future visits.  3. Blood in the stool Patient denies any instance of recent blood in stool.  Continue to monitor.  4. Coronary artery disease involving native coronary artery of native heart without angina pectoris Stable,Patient is encouraged to continue to follow-up with  cardiology.  General Counseling: Desmon verbalizes understanding of the findings of todays visit and agrees with plan of treatment. I have discussed any further diagnostic evaluation that may be needed or ordered today. We also reviewed his medications today. he has been encouraged to call the office with any questions or concerns that should arise related to todays visit.    No orders of the defined types were placed in this encounter.   Meds ordered this encounter  Medications  . lubiprostone (AMITIZA) 8 MCG capsule    Sig: Take 1 capsule (8 mcg total) by mouth 2 (two) times daily with a meal.    Dispense:  60 capsule    Refill:  3    Time spent: 25 Minutes   This patient was seen by Orson Gear AGNP-C in Collaboration with Dr Lavera Guise as a part of collaborative care agreement     Kendell Bane AGNP-C Internal medicine

## 2018-11-15 NOTE — Patient Instructions (Signed)

## 2018-11-20 ENCOUNTER — Encounter: Payer: Self-pay | Admitting: Nurse Practitioner

## 2019-01-03 ENCOUNTER — Encounter: Payer: Self-pay | Admitting: Adult Health

## 2019-05-19 ENCOUNTER — Ambulatory Visit: Payer: BC Managed Care – PPO | Admitting: Nurse Practitioner

## 2019-05-19 ENCOUNTER — Other Ambulatory Visit: Payer: Self-pay

## 2019-05-19 ENCOUNTER — Encounter: Payer: Self-pay | Admitting: Nurse Practitioner

## 2019-05-19 VITALS — BP 126/82 | HR 56 | Temp 98.4°F | Resp 16 | Ht 65.0 in | Wt 188.2 lb

## 2019-05-19 DIAGNOSIS — I2583 Coronary atherosclerosis due to lipid rich plaque: Secondary | ICD-10-CM | POA: Diagnosis not present

## 2019-05-19 DIAGNOSIS — I10 Essential (primary) hypertension: Secondary | ICD-10-CM

## 2019-05-19 DIAGNOSIS — E782 Mixed hyperlipidemia: Secondary | ICD-10-CM | POA: Diagnosis not present

## 2019-05-19 DIAGNOSIS — I2510 Atherosclerotic heart disease of native coronary artery without angina pectoris: Secondary | ICD-10-CM | POA: Diagnosis not present

## 2019-05-19 NOTE — Progress Notes (Signed)
[facility] [place], [place] [address]  Internal MEDICINE  Office Visit Note  Patient Name: Nicholas Torres  [id number]  [id number]  Date of Service: 05/19/2019  Chief Complaint  Patient presents with  . Medical Management of Chronic Issues    6 month follow up  . Hyperlipidemia  . Hypertension    The patient is here for routine follow up exam. He has noted a small, palpable, lump at the base of his sternum. This is not painful and feels like a bone. He mentioned this to his cardiologist last week. They did take a chest x-ray which was negative for any acute abnormalities. He states that he is otherwise feeling well. No problems or concerns to discuss today.       Current Medication: Outpatient Encounter Medications as of 05/19/2019  Medication Sig  . Alirocumab (PRALUENT) 150 MG/ML SOAJ Inject into the skin every 30 (thirty) days.  Marland Kitchen aspirin EC 325 MG EC tablet Take 1 tablet (325 mg total) by mouth daily.  . fluticasone (FLONASE) 50 MCG/ACT nasal spray Place 1 spray into both nostrils daily.  Marland Kitchen lisinopril-hydrochlorothiazide (PRINZIDE,ZESTORETIC) 10-12.5 MG tablet Take 1 tablet by mouth daily.  . metoprolol tartrate (LOPRESSOR) 25 MG tablet Take 0.5 tablets (12.5 mg total) by mouth daily.  . rosuvastatin (CRESTOR) 10 MG tablet Take 10 mg by mouth daily.  . [DISCONTINUED] amoxicillin (AMOXIL) 875 MG tablet Take 1 tablet (875 mg total) by mouth 2 (two) times daily. (Patient not taking: Reported on 03/19/2018)  . [DISCONTINUED] chlorpheniramine-HYDROcodone (TUSSIONEX PENNKINETIC ER) 10-8 MG/5ML SUER Take 5 mLs by mouth every 12 (twelve) hours as needed for cough. (Patient not taking: Reported on 05/19/2019)  . [DISCONTINUED] Lidocaine 2 % GEL Apply 1 application topically 3 (three) times daily as needed. (Patient not taking: Reported on 05/19/2019)  . [DISCONTINUED] lubiprostone (AMITIZA) 8 MCG capsule Take 1 capsule (8 mcg total) by mouth 2 (two) times daily with a  meal. (Patient not taking: Reported on 05/19/2019)  . [DISCONTINUED] Nitroglycerin 0.4 % OINT Place 1 application rectally 2 (two) times daily. Apply twice daily for 8 weeks (Patient not taking: Reported on 05/19/2019)   No facility-administered encounter medications on file as of 05/19/2019.     Surgical History: Past Surgical History:  Procedure Laterality Date  . CARDIAC CATHETERIZATION Right 07/24/2016   Procedure: Left Heart Cath and Coronary Angiography;  Surgeon: Dionisio David, MD;  Location: [place] [facility];  Service: Cardiovascular;  Laterality: Right;  . CORONARY ARTERY BYPASS GRAFT N/A 07/26/2016   Procedure: CORONARY ARTERY BYPASS GRAFTING (CABG), ON PUMP, TIMES FOUR, USING BILATERAL INTERNAL MAMMARY ARTERIES, RIGHT GREATER SAPHENOUS VEIN HARVESTED ENDOSCOPICALLY;  Surgeon: Melrose Nakayama, MD;  Location: [place];  Service: Open Heart Surgery;  Laterality: N/A;  . RADIAL ARTERY HARVEST Left 07/26/2016   Procedure: RADIAL LEFT ARTERY HARVEST;  Surgeon: Melrose Nakayama, MD;  Location: [place];  Service: Open Heart Surgery;  Laterality: Left;  . TEE WITHOUT CARDIOVERSION N/A 07/26/2016   Procedure: TRANSESOPHAGEAL ECHOCARDIOGRAM (TEE);  Surgeon: Melrose Nakayama, MD;  Location: [place];  Service: Open Heart Surgery;  Laterality: N/A;    Medical History: Past Medical History:  Diagnosis Date  . CAD (coronary artery disease)   . High cholesterol   . Hypertension   . Pollen allergy     Family History: Family History  Problem Relation Age of Onset  . Diabetes Mother   . Heart attack Father   . CAD Brother  Social History   Socioeconomic History  . Marital status: Married    Spouse name: Not on file  . Number of children: Not on file  . Years of education: Not on file  . Highest education level: Not on file  Occupational History  . Not on file  Social Needs  . Financial resource strain: Not on file  . Food insecurity    Worry: Not on file    Inability: Not  on file  . Transportation needs    Medical: Not on file    Non-medical: Not on file  Tobacco Use  . Smoking status: Never Smoker  . Smokeless tobacco: Never Used  Substance and Sexual Activity  . Alcohol use: No  . Drug use: No  . Sexual activity: Yes  Lifestyle  . Physical activity    Days per week: Not on file    Minutes per session: Not on file  . Stress: Not on file  Relationships  . Social Herbalist on phone: Not on file    Gets together: Not on file    Attends religious service: Not on file    Active member of club or organization: Not on file    Attends meetings of clubs or organizations: Not on file    Relationship status: Not on file  . Intimate partner violence    Fear of current or ex partner: Not on file    Emotionally abused: Not on file    Physically abused: Not on file    Forced sexual activity: Not on file  Other Topics Concern  . Not on file  Social History Narrative  . Not on file      Review of Systems  Constitutional: Negative for activity change, chills, fatigue and unexpected weight change.  HENT: Negative for congestion, postnasal drip, rhinorrhea, sneezing and sore throat.   Respiratory: Negative for cough, chest tightness, shortness of breath and wheezing.   Cardiovascular: Negative for chest pain and palpitations.  Gastrointestinal: Negative for abdominal pain, blood in stool, constipation, diarrhea, nausea, rectal pain and vomiting.  Endocrine: Negative for cold intolerance, heat intolerance, polyphagia and polyuria.  Musculoskeletal: Negative for arthralgias.       He states he can feel bony abnormality at the base of his sternum/right rib cage. Has become noticeable since he had open heart surgery a few years ago   Skin: Negative for rash.  Allergic/Immunologic: Negative for environmental allergies.  Neurological: Negative for dizziness, tremors, numbness and headaches.  Hematological: Negative for adenopathy. Does not  bruise/bleed easily.  Psychiatric/Behavioral: Negative for behavioral problems (Depression), sleep disturbance and suicidal ideas. The patient is not nervous/anxious.    Today's Vitals   05/19/19 0906  BP: 126/82  Pulse: (!) 56  Resp: 16  Temp: 98.4 F (36.9 C)  SpO2: 92%  Weight: 188 lb 3.2 oz (85.4 kg)  Height: 5\' 5"  (1.651 m)   Body mass index is 31.32 kg/m.   Physical Exam Vitals signs and nursing note reviewed.  Constitutional:      General: He is not in acute distress.    Appearance: Normal appearance. He is well-developed. He is not diaphoretic.  HENT:     Head: Normocephalic and atraumatic.     Mouth/Throat:     Pharynx: No oropharyngeal exudate.  Eyes:     Pupils: Pupils are equal, round, and reactive to light.  Neck:     Musculoskeletal: Normal range of motion and neck supple.     Thyroid:  No thyromegaly.     Vascular: No JVD.     Trachea: No tracheal deviation.  Cardiovascular:     Rate and Rhythm: Normal rate and regular rhythm.     Heart sounds: Normal heart sounds. No murmur. No friction rub. No gallop.   Pulmonary:     Effort: Pulmonary effort is normal. No respiratory distress.     Breath sounds: Normal breath sounds. No wheezing or rales.  Chest:     Chest wall: No tenderness.  Abdominal:     Palpations: Abdomen is soft.     Tenderness: There is no abdominal tenderness. There is no guarding.  Musculoskeletal: Normal range of motion.     Comments: Base of rib cage is asymmetrical with right rib cage longer than left. No tenderness. Keloid operative scar present in midchest.   Lymphadenopathy:     Cervical: No cervical adenopathy.  Skin:    General: Skin is warm and dry.  Neurological:     Mental Status: He is alert and oriented to person, place, and time.     Cranial Nerves: No cranial nerve deficit.  Psychiatric:        Behavior: Behavior normal.        Thought Content: Thought content normal.        Judgment: Judgment normal.     Assessment/Plan: 1. Essential hypertension Stable. Continue bp medication as prescribed   2. Coronary artery disease due to lipid rich plaque Regular visits with cardiology as scheduled.   3. Mixed hyperlipidemia Patient now on Praluent once monthly per cardiology. Will get copy of most recent labs for review.   General Counseling: Jw verbalizes understanding of the findings of todays visit and agrees with plan of treatment. I have discussed any further diagnostic evaluation that may be needed or ordered today. We also reviewed his medications today. he has been encouraged to call the office with any questions or concerns that should arise related to todays visit.  Hypertension Counseling:   The following hypertensive lifestyle modification were recommended and discussed:  1. Limiting alcohol intake to less than 1 oz/day of ethanol:(24 oz of beer or 8 oz of wine or 2 oz of 100-proof whiskey). 2. Take baby ASA 81 mg daily. 3. Importance of regular aerobic exercise and losing weight. 4. Reduce dietary saturated fat and cholesterol intake for overall cardiovascular health. 5. Maintaining adequate dietary potassium, calcium, and magnesium intake. 6. Regular monitoring of the blood pressure. 7. Reduce sodium intake to less than 100 mmol/day (less than 2.3 gm of sodium or less than 6 gm of sodium choride)   This patient was seen by [place] with Dr Lavera Guise as a part of collaborative care agreement  Time spent: 25 Minutes      Dr Lavera Guise Internal medicine

## 2019-06-02 ENCOUNTER — Other Ambulatory Visit: Payer: Self-pay

## 2019-06-02 DIAGNOSIS — I10 Essential (primary) hypertension: Secondary | ICD-10-CM

## 2019-06-02 MED ORDER — METOPROLOL TARTRATE 25 MG PO TABS: 12.5000 mg | ORAL_TABLET | Freq: Every day | ORAL | 2 refills | Status: DC

## 2019-09-29 ENCOUNTER — Other Ambulatory Visit: Payer: Self-pay

## 2019-09-29 ENCOUNTER — Encounter: Payer: Self-pay | Admitting: Emergency Medicine

## 2019-09-29 ENCOUNTER — Emergency Department: Payer: No Typology Code available for payment source

## 2019-09-29 ENCOUNTER — Emergency Department
Admission: EM | Admit: 2019-09-29 | Discharge: 2019-09-29 | Disposition: A | Discharge status: Home / Self Care | Payer: No Typology Code available for payment source | Attending: Emergency Medicine | Admitting: Emergency Medicine

## 2019-09-29 DIAGNOSIS — I2510 Atherosclerotic heart disease of native coronary artery without angina pectoris: Secondary | ICD-10-CM | POA: Insufficient documentation

## 2019-09-29 DIAGNOSIS — G44319 Acute post-traumatic headache, not intractable: Secondary | ICD-10-CM | POA: Insufficient documentation

## 2019-09-29 DIAGNOSIS — Z7982 Long term (current) use of aspirin: Secondary | ICD-10-CM | POA: Insufficient documentation

## 2019-09-29 DIAGNOSIS — I252 Old myocardial infarction: Secondary | ICD-10-CM | POA: Insufficient documentation

## 2019-09-29 DIAGNOSIS — Z79899 Other long term (current) drug therapy: Secondary | ICD-10-CM | POA: Insufficient documentation

## 2019-09-29 DIAGNOSIS — I10 Essential (primary) hypertension: Secondary | ICD-10-CM | POA: Insufficient documentation

## 2019-09-29 DIAGNOSIS — R519 Headache, unspecified: Secondary | ICD-10-CM | POA: Diagnosis present

## 2019-09-29 DIAGNOSIS — M549 Dorsalgia, unspecified: Secondary | ICD-10-CM | POA: Insufficient documentation

## 2019-09-29 DIAGNOSIS — Z951 Presence of aortocoronary bypass graft: Secondary | ICD-10-CM | POA: Insufficient documentation

## 2019-09-29 DIAGNOSIS — M542 Cervicalgia: Secondary | ICD-10-CM | POA: Insufficient documentation

## 2019-09-29 MED ORDER — CYCLOBENZAPRINE HCL 5 MG PO TABS: ORAL_TABLET | ORAL | 0 refills | Status: DC

## 2019-09-29 MED ORDER — IBUPROFEN 600 MG PO TABS: 600.0000 mg | ORAL_TABLET | Freq: Four times a day (QID) | ORAL | 0 refills | Status: DC | PRN

## 2019-09-29 MED ORDER — OXYCODONE-ACETAMINOPHEN 5-325 MG PO TABS
1.0000 | ORAL_TABLET | Freq: Once | ORAL | Status: AC
  Administered 2019-09-29: 1 via ORAL
  Filled 2019-09-29: qty 1
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
## 2019-09-29 NOTE — ED Notes (Signed)
Spoke with Lance Sell at [employer] [phone] regarding WC profile stating "UDS only upon request." [employer] "we do not need one."
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
## 2019-09-29 NOTE — ED Notes (Signed)
Water quality scientist at bedside speaking with patient

## 2019-10-23 ENCOUNTER — Telehealth: Payer: Self-pay

## 2019-10-23 NOTE — Telephone Encounter (Signed)
Patient rescheduled appointment on 10/27/2019 to 11/04/2019. klh

## 2019-10-27 ENCOUNTER — Encounter: Payer: BC Managed Care – PPO | Admitting: Adult Health

## 2019-10-31 ENCOUNTER — Telehealth: Payer: Self-pay

## 2019-10-31 NOTE — Telephone Encounter (Signed)
Confirmed appointment with patient. klh °

## 2019-11-04 ENCOUNTER — Encounter: Payer: Self-pay | Admitting: Adult Health

## 2019-11-04 ENCOUNTER — Ambulatory Visit: Payer: BC Managed Care – PPO | Admitting: Adult Health

## 2019-11-04 ENCOUNTER — Other Ambulatory Visit: Payer: Self-pay

## 2019-11-04 VITALS — BP 130/72 | HR 57 | Temp 97.2°F | Resp 16 | Ht 65.0 in | Wt 187.0 lb

## 2019-11-04 DIAGNOSIS — R300 Dysuria: Secondary | ICD-10-CM

## 2019-11-04 DIAGNOSIS — Z125 Encounter for screening for malignant neoplasm of prostate: Secondary | ICD-10-CM

## 2019-11-04 DIAGNOSIS — E782 Mixed hyperlipidemia: Secondary | ICD-10-CM

## 2019-11-04 DIAGNOSIS — Z0001 Encounter for general adult medical examination with abnormal findings: Secondary | ICD-10-CM | POA: Diagnosis not present

## 2019-11-04 DIAGNOSIS — I10 Essential (primary) hypertension: Secondary | ICD-10-CM | POA: Diagnosis not present
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
## 2019-11-05 LAB — UA/M W/RFLX CULTURE, ROUTINE
Bilirubin, UA: NEGATIVE
Glucose, UA: NEGATIVE
Ketones, UA: NEGATIVE
Leukocytes,UA: NEGATIVE
Nitrite, UA: NEGATIVE
Protein,UA: NEGATIVE
RBC, UA: NEGATIVE
Specific Gravity, UA: 1.022 (ref 1.005–1.030)
Urobilinogen, Ur: 0.2 mg/dL (ref 0.2–1.0)
pH, UA: 5 (ref 5.0–7.5)

## 2019-11-05 LAB — MICROSCOPIC EXAMINATION
Bacteria, UA: NONE SEEN
Casts: NONE SEEN /lpf
Epithelial Cells (non renal): NONE SEEN /hpf (ref 0–10)

## 2019-11-28 ENCOUNTER — Telehealth: Payer: Self-pay

## 2019-11-28 NOTE — Telephone Encounter (Signed)
Confirmed virtual visit with patient. klh 

## 2019-12-02 ENCOUNTER — Encounter: Payer: Self-pay | Admitting: Adult Health

## 2019-12-02 ENCOUNTER — Ambulatory Visit: Payer: BC Managed Care – PPO | Admitting: Adult Health

## 2019-12-02 VITALS — Ht 66.0 in | Wt 185.0 lb

## 2019-12-02 DIAGNOSIS — E782 Mixed hyperlipidemia: Secondary | ICD-10-CM

## 2019-12-02 DIAGNOSIS — I10 Essential (primary) hypertension: Secondary | ICD-10-CM

## 2019-12-02 DIAGNOSIS — K4090 Unilateral inguinal hernia, without obstruction or gangrene, not specified as recurrent: Secondary | ICD-10-CM | POA: Diagnosis not present

## 2019-12-02 NOTE — Progress Notes (Signed)
[facility] [place], [place] [address]  Internal MEDICINE  Telephone Visit  Patient Name: Nicholas Torres  [id number]  CJ:[id number]  Date of Service: 12/02/2019  I connected with the patient at 355 by telephone and verified the patients identity using two identifiers.   I discussed the limitations, risks, security and privacy concerns of performing an evaluation and management service by telephone and the availability of in person appointments. I also discussed with the patient that there may be a patient responsible charge related to the service.  The patient expressed understanding and agrees to proceed.    Chief Complaint  Patient presents with  . Telephone Assessment  . Telephone Screen  . Follow-up    LABS     HPI  Pt seen via video.  He is seen today to review labs. His labs were reviewed with him.  His lipid panel is well controlled and completely WNL.  His thyroid, and psa are normal.  His cmp is wnl also.  He denies any new or worsening symptoms. He reports a bulging area near his groin.  He reports it is reducible, but he feels like it is getting bigger. We discussed a CT scan at his last visit, and he is ready to proceed with that at this time.      Current Medication: Outpatient Encounter Medications as of 12/02/2019  Medication Sig  . Alirocumab (PRALUENT) 150 MG/ML SOAJ Inject into the skin every 30 (thirty) days.  Marland Kitchen aspirin EC 325 MG EC tablet Take 1 tablet (325 mg total) by mouth daily.  . cyclobenzaprine (FLEXERIL) 10 MG tablet Take 10 mg by mouth at bedtime.  . Evolocumab 140 MG/ML SOAJ Inject into the skin.  . fluticasone (FLONASE) 50 MCG/ACT nasal spray Place 1 spray into both nostrils daily.  Marland Kitchen ibuprofen (ADVIL) 600 MG tablet Take 1 tablet (600 mg total) by mouth every 6 (six) hours as needed.  Marland Kitchen lisinopril-hydrochlorothiazide (PRINZIDE,ZESTORETIC) 10-12.5 MG tablet Take 1 tablet by mouth daily.  . metoprolol tartrate (LOPRESSOR) 25 MG  tablet Take 0.5 tablets (12.5 mg total) by mouth daily.  . rosuvastatin (CRESTOR) 10 MG tablet Take 10 mg by mouth daily.   No facility-administered encounter medications on file as of 12/02/2019.    Surgical History: Past Surgical History:  Procedure Laterality Date  . CARDIAC CATHETERIZATION Right 07/24/2016   Procedure: Left Heart Cath and Coronary Angiography;  Surgeon: Dionisio David, MD;  Location: [place] [facility];  Service: Cardiovascular;  Laterality: Right;  . CORONARY ARTERY BYPASS GRAFT N/A 07/26/2016   Procedure: CORONARY ARTERY BYPASS GRAFTING (CABG), ON PUMP, TIMES FOUR, USING BILATERAL INTERNAL MAMMARY ARTERIES, RIGHT GREATER SAPHENOUS VEIN HARVESTED ENDOSCOPICALLY;  Surgeon: Melrose Nakayama, MD;  Location: [place];  Service: Open Heart Surgery;  Laterality: N/A;  . RADIAL ARTERY HARVEST Left 07/26/2016   Procedure: RADIAL LEFT ARTERY HARVEST;  Surgeon: Melrose Nakayama, MD;  Location: [place];  Service: Open Heart Surgery;  Laterality: Left;  . TEE WITHOUT CARDIOVERSION N/A 07/26/2016   Procedure: TRANSESOPHAGEAL ECHOCARDIOGRAM (TEE);  Surgeon: Melrose Nakayama, MD;  Location: [place];  Service: Open Heart Surgery;  Laterality: N/A;    Medical History: Past Medical History:  Diagnosis Date  . CAD (coronary artery disease)   . High cholesterol   . Hypertension   . Pollen allergy     Family History: Family History  Problem Relation Age of Onset  . Diabetes Mother   . Heart attack Father   .  CAD Brother     Social History   Socioeconomic History  . Marital status: Married    Spouse name: Not on file  . Number of children: Not on file  . Years of education: Not on file  . Highest education level: Not on file  Occupational History  . Not on file  Tobacco Use  . Smoking status: Never Smoker  . Smokeless tobacco: Never Used  Substance and Sexual Activity  . Alcohol use: No  . Drug use: No  . Sexual activity: Yes  Other Topics Concern  . Not on  file  Social History Narrative  . Not on file   Social Determinants of Health   Financial Resource Strain:   . Difficulty of Paying Living Expenses: Not on file  Food Insecurity:   . Worried About Charity fundraiser in the Last Year: Not on file  . Ran Out of Food in the Last Year: Not on file  Transportation Needs:   . Lack of Transportation (Medical): Not on file  . Lack of Transportation (Non-Medical): Not on file  Physical Activity:   . Days of Exercise per Week: Not on file  . Minutes of Exercise per Session: Not on file  Stress:   . Feeling of Stress : Not on file  Social Connections:   . Frequency of Communication with Friends and Family: Not on file  . Frequency of Social Gatherings with Friends and Family: Not on file  . Attends Religious Services: Not on file  . Active Member of Clubs or Organizations: Not on file  . Attends Archivist Meetings: Not on file  . Marital Status: Not on file  Intimate Partner Violence:   . Fear of Current or Ex-Partner: Not on file  . Emotionally Abused: Not on file  . Physically Abused: Not on file  . Sexually Abused: Not on file      Review of Systems  Constitutional: Negative.  Negative for chills, fatigue and unexpected weight change.  HENT: Negative.  Negative for congestion, rhinorrhea, sneezing and sore throat.   Eyes: Negative for redness.  Respiratory: Negative.  Negative for cough, chest tightness and shortness of breath.   Cardiovascular: Negative.  Negative for chest pain and palpitations.  Gastrointestinal: Negative.  Negative for abdominal pain, constipation, diarrhea, nausea and vomiting.  Endocrine: Negative.   Genitourinary: Negative.  Negative for dysuria and frequency.  Musculoskeletal: Negative.  Negative for arthralgias, back pain, joint swelling and neck pain.  Skin: Negative.  Negative for rash.  Allergic/Immunologic: Negative.   Neurological: Negative.  Negative for tremors and numbness.   Hematological: Negative for adenopathy. Does not bruise/bleed easily.  Psychiatric/Behavioral: Negative.  Negative for behavioral problems, sleep disturbance and suicidal ideas. The patient is not nervous/anxious.     Vital Signs: Ht 5\' 6"  (1.676 m)   Wt 185 lb (83.9 kg)   BMI 29.86 kg/m    Observation/Objective:  Well appearing, NAD noted.   Assessment/Plan: 1. Essential hypertension Stable, continue present management.  2. Mixed hyperlipidemia Lipid panel will controlled continue crestor as prescribed.   3. Non-recurrent unilateral inguinal hernia without obstruction or gangrene Will get Ct abdomen to evaluate for possible hernia.  - CT Abdomen Pelvis Wo Contrast; Future  General Counseling: Saylor verbalizes understanding of the findings of today's phone visit and agrees with plan of treatment. I have discussed any further diagnostic evaluation that may be needed or ordered today. We also reviewed his medications today. he has been encouraged to  call the office with any questions or concerns that should arise related to todays visit.    Orders Placed This Encounter  Procedures  . CT Abdomen Pelvis Wo Contrast    No orders of the defined types were placed in this encounter.   Time spent: [facility] Internal medicine

## 2019-12-15 ENCOUNTER — Telehealth: Payer: Self-pay

## 2019-12-15 ENCOUNTER — Other Ambulatory Visit: Payer: Self-pay

## 2019-12-15 ENCOUNTER — Ambulatory Visit
Admission: RE | Admit: 2019-12-15 | Discharge: 2019-12-15 | Disposition: A | Discharge status: Home / Self Care | Payer: BC Managed Care – PPO | Source: Ambulatory Visit | Attending: Adult Health | Admitting: Adult Health

## 2019-12-15 DIAGNOSIS — K4090 Unilateral inguinal hernia, without obstruction or gangrene, not specified as recurrent: Secondary | ICD-10-CM | POA: Insufficient documentation

## 2019-12-15 NOTE — Telephone Encounter (Signed)
Confirmed virtual visit with patient. klh 

## 2019-12-17 ENCOUNTER — Ambulatory Visit: Payer: BC Managed Care – PPO | Admitting: Adult Health

## 2019-12-17 ENCOUNTER — Other Ambulatory Visit: Payer: Self-pay

## 2019-12-17 ENCOUNTER — Encounter: Payer: Self-pay | Admitting: Adult Health

## 2019-12-17 VITALS — Ht 65.0 in | Wt 185.0 lb

## 2019-12-17 DIAGNOSIS — I10 Essential (primary) hypertension: Secondary | ICD-10-CM

## 2019-12-17 DIAGNOSIS — N281 Cyst of kidney, acquired: Secondary | ICD-10-CM

## 2019-12-17 DIAGNOSIS — K4090 Unilateral inguinal hernia, without obstruction or gangrene, not specified as recurrent: Secondary | ICD-10-CM

## 2019-12-17 DIAGNOSIS — I700 Atherosclerosis of aorta: Secondary | ICD-10-CM

## 2019-12-17 NOTE — Progress Notes (Signed)
[facility] [place], [place] [address]  Internal MEDICINE  Telephone Visit  Patient Name: Nicholas Torres  [id number]  CJ:[id number]  Date of Service: 12/17/2019  I connected with the patient at 248 by telephone and verified the patients identity using two identifiers.   I discussed the limitations, risks, security and privacy concerns of performing an evaluation and management service by telephone and the availability of in person appointments. I also discussed with the patient that there may be a patient responsible charge related to the service.  The patient expressed understanding and agrees to proceed.    Chief Complaint  Patient presents with  . Telephone Assessment  . Telephone Screen  . Follow-up    review CT    HPI  Pt is seen today via telephone. Today we are reviewing abdominal CT scan with multiple findings. As suspected he has an inguinal hernia.  It does not appear to be obstructed or incarcerated. He reports still having some minor discomfort when sitting or driving. He would like to see a surgeon for possible correction.  The Ct also showed some renal cysts bilaterally, and some aortic atherosclerosis.  He does see cardiology, but does not recall having carotid ultrasounds performed recently.  His bp is currently well controlled. Denies Chest pain, Shortness of breath, palpitations, headache, or blurred vision.    Current Medication: Outpatient Encounter Medications as of 12/17/2019  Medication Sig  . Alirocumab (PRALUENT) 150 MG/ML SOAJ Inject into the skin every 30 (thirty) days.  Marland Kitchen aspirin EC 325 MG EC tablet Take 1 tablet (325 mg total) by mouth daily.  . cyclobenzaprine (FLEXERIL) 10 MG tablet Take 10 mg by mouth at bedtime.  . Evolocumab 140 MG/ML SOAJ Inject into the skin.  . fluticasone (FLONASE) 50 MCG/ACT nasal spray Place 1 spray into both nostrils daily.  Marland Kitchen ibuprofen (ADVIL) 600 MG tablet Take 1 tablet (600 mg total) by mouth every 6  (six) hours as needed.  Marland Kitchen lisinopril-hydrochlorothiazide (PRINZIDE,ZESTORETIC) 10-12.5 MG tablet Take 1 tablet by mouth daily.  . metoprolol tartrate (LOPRESSOR) 25 MG tablet Take 0.5 tablets (12.5 mg total) by mouth daily.  . rosuvastatin (CRESTOR) 10 MG tablet Take 10 mg by mouth daily.   No facility-administered encounter medications on file as of 12/17/2019.    Surgical History: Past Surgical History:  Procedure Laterality Date  . CARDIAC CATHETERIZATION Right 07/24/2016   Procedure: Left Heart Cath and Coronary Angiography;  Surgeon: Dionisio David, MD;  Location: [place] [facility];  Service: Cardiovascular;  Laterality: Right;  . CORONARY ARTERY BYPASS GRAFT N/A 07/26/2016   Procedure: CORONARY ARTERY BYPASS GRAFTING (CABG), ON PUMP, TIMES FOUR, USING BILATERAL INTERNAL MAMMARY ARTERIES, RIGHT GREATER SAPHENOUS VEIN HARVESTED ENDOSCOPICALLY;  Surgeon: Melrose Nakayama, MD;  Location: [place];  Service: Open Heart Surgery;  Laterality: N/A;  . RADIAL ARTERY HARVEST Left 07/26/2016   Procedure: RADIAL LEFT ARTERY HARVEST;  Surgeon: Melrose Nakayama, MD;  Location: [place];  Service: Open Heart Surgery;  Laterality: Left;  . TEE WITHOUT CARDIOVERSION N/A 07/26/2016   Procedure: TRANSESOPHAGEAL ECHOCARDIOGRAM (TEE);  Surgeon: Melrose Nakayama, MD;  Location: [place];  Service: Open Heart Surgery;  Laterality: N/A;    Medical History: Past Medical History:  Diagnosis Date  . CAD (coronary artery disease)   . High cholesterol   . Hypertension   . Pollen allergy     Family History: Family History  Problem Relation Age of Onset  . Diabetes Mother   .  Heart attack Father   . CAD Brother     Social History   Socioeconomic History  . Marital status: Married    Spouse name: Not on file  . Number of children: Not on file  . Years of education: Not on file  . Highest education level: Not on file  Occupational History  . Not on file  Tobacco Use  . Smoking status: Never  Smoker  . Smokeless tobacco: Never Used  Substance and Sexual Activity  . Alcohol use: No  . Drug use: No  . Sexual activity: Yes  Other Topics Concern  . Not on file  Social History Narrative  . Not on file   Social Determinants of Health   Financial Resource Strain:   . Difficulty of Paying Living Expenses: Not on file  Food Insecurity:   . Worried About Charity fundraiser in the Last Year: Not on file  . Ran Out of Food in the Last Year: Not on file  Transportation Needs:   . Lack of Transportation (Medical): Not on file  . Lack of Transportation (Non-Medical): Not on file  Physical Activity:   . Days of Exercise per Week: Not on file  . Minutes of Exercise per Session: Not on file  Stress:   . Feeling of Stress : Not on file  Social Connections:   . Frequency of Communication with Friends and Family: Not on file  . Frequency of Social Gatherings with Friends and Family: Not on file  . Attends Religious Services: Not on file  . Active Member of Clubs or Organizations: Not on file  . Attends Archivist Meetings: Not on file  . Marital Status: Not on file  Intimate Partner Violence:   . Fear of Current or Ex-Partner: Not on file  . Emotionally Abused: Not on file  . Physically Abused: Not on file  . Sexually Abused: Not on file      Review of Systems  Constitutional: Negative.  Negative for chills, fatigue and unexpected weight change.  HENT: Negative.  Negative for congestion, rhinorrhea, sneezing and sore throat.   Eyes: Negative for redness.  Respiratory: Negative.  Negative for cough, chest tightness and shortness of breath.   Cardiovascular: Negative.  Negative for chest pain and palpitations.  Gastrointestinal: Negative.  Negative for abdominal pain, constipation, diarrhea, nausea and vomiting.  Endocrine: Negative.   Genitourinary: Negative.  Negative for dysuria and frequency.  Musculoskeletal: Negative.  Negative for arthralgias, back pain,  joint swelling and neck pain.  Skin: Negative.  Negative for rash.  Allergic/Immunologic: Negative.   Neurological: Negative.  Negative for tremors and numbness.  Hematological: Negative for adenopathy. Does not bruise/bleed easily.  Psychiatric/Behavioral: Negative.  Negative for behavioral problems, sleep disturbance and suicidal ideas. The patient is not nervous/anxious.     Vital Signs: Ht 5\' 5"  (1.651 m)   Wt 185 lb (83.9 kg)   BMI 30.79 kg/m    Observation/Objective:  Well sounding, NAD noted.    Assessment/Plan: 1. Aortic atherosclerosis (HCC) Atherosclerosis present, need surveillance of carotids for stenosis.  - US Carotid Bilateral; Future  2. Renal cyst Bilateral cyst, monitor in future.   3. Essential hypertension Stable, continue present management.  4. Unilateral inguinal hernia without obstruction or gangrene, recurrence not specified Palpable inguinal hernia,  - Ambulatory referral to General Surgery  General Counseling: Malik verbalizes understanding of the findings of today's phone visit and agrees with plan of treatment. I have discussed any further diagnostic evaluation  that may be needed or ordered today. We also reviewed his medications today. he has been encouraged to call the office with any questions or concerns that should arise related to todays visit.    Orders Placed This Encounter  Procedures  . US Carotid Bilateral  . Ambulatory referral to General Surgery    No orders of the defined types were placed in this encounter.   Time spent: [place] [facility] Internal medicine

## 2019-12-18 ENCOUNTER — Ambulatory Visit: Payer: Self-pay | Admitting: General Surgery

## 2019-12-23 ENCOUNTER — Encounter: Payer: Self-pay | Admitting: General Surgery

## 2019-12-23 ENCOUNTER — Other Ambulatory Visit: Payer: Self-pay | Admitting: General Surgery

## 2019-12-23 ENCOUNTER — Ambulatory Visit: Payer: BC Managed Care – PPO | Admitting: General Surgery

## 2019-12-23 ENCOUNTER — Other Ambulatory Visit: Payer: Self-pay

## 2019-12-23 VITALS — BP 132/84 | HR 91 | Temp 97.7°F | Ht 65.0 in | Wt 183.0 lb

## 2019-12-23 DIAGNOSIS — K4090 Unilateral inguinal hernia, without obstruction or gangrene, not specified as recurrent: Secondary | ICD-10-CM
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
## 2019-12-23 NOTE — Patient Instructions (Addendum)
Our surgery scheduler Marzetta Board will contact you within the next 24-48 hours. Marzetta Board will discussed with you the surgery dates, times and she will also discuss the preparation for surgery. Please have the BLUE sheet available when she contacts you to get you scheduled. If you have any questions or concerns, please feel free to contact our office.                                                                                                                                                 Inguinal Hernia, Adult An inguinal hernia develops when fat or the intestines push through a weak spot in a muscle where your leg meets your lower abdomen (groin). This creates a bulge. This kind of hernia could also be:  In your scrotum, if you are male.  In folds of skin around your vagina, if you are male. There are three types of inguinal hernias:  Hernias that can be pushed back into the abdomen (are reducible). This type rarely causes pain.  Hernias that are not reducible (are incarcerated).  Hernias that are not reducible and lose their blood supply (are strangulated). This type of hernia requires emergency surgery. What are the causes? This condition is caused by having a weak spot in the muscles or tissues in the groin. This weak spot develops over time. The hernia may poke through the weak spot when you suddenly strain your lower abdominal muscles, such as when you:  Lift a heavy object.  Strain to have a bowel movement. Constipation can lead to straining.  Cough. What increases the risk? This condition is more likely to develop in:  Men.  Pregnant women.  People who: ? Are overweight. ? Work in jobs that require long periods of standing or heavy lifting. ? Have had an inguinal hernia before. ? Smoke or have lung disease. These factors can lead to long-lasting (chronic) coughing. What are the signs or symptoms? Symptoms may depend on the size of the hernia. Often, a small inguinal hernia has  no symptoms. Symptoms of a larger hernia may include:  A lump in the groin area. This is easier to see when standing. It might not be visible when lying down.  Pain or burning in the groin. This may get worse when lifting, straining, or coughing.  A dull ache or a feeling of pressure in the groin.  In men, an unusual lump in the scrotum. Symptoms of a strangulated inguinal hernia may include:  A bulge in your groin that is very painful and tender to the touch.  A bulge that turns red or purple.  Fever, nausea, and vomiting.  Inability to have a bowel movement or to pass gas. How is this diagnosed? This condition is diagnosed based on your symptoms, your medical history, and a physical exam. Your health care provider may feel your groin  area and ask you to cough. How is this treated? Treatment depends on the size of your hernia and whether you have symptoms. If you do not have symptoms, your health care provider may have you watch your hernia carefully and have you come in for follow-up visits. If your hernia is large or if you have symptoms, you may need surgery to repair the hernia. Follow these instructions at home: Lifestyle  Avoid lifting heavy objects.  Avoid standing for long periods of time.  Do not use any products that contain nicotine or tobacco, such as cigarettes and e-cigarettes. If you need help quitting, ask your health care provider.  Maintain a healthy weight. Preventing constipation  Take actions to prevent constipation. Constipation leads to straining with bowel movements, which can make a hernia worse or cause a hernia repair to break down. Your health care provider may recommend that you: ? Drink enough fluid to keep your urine pale yellow. ? Eat foods that are high in fiber, such as fresh fruits and vegetables, whole grains, and beans. ? Limit foods that are high in fat and processed sugars, such as fried or sweet foods. ? Take an over-the-counter or  prescription medicine for constipation. General instructions  You may try to push the hernia back in place by very gently pressing on it while lying down. Do not try to force the bulge back in if it will not push in easily.  Watch your hernia for any changes in shape, size, or color. Get help right away if you notice any changes.  Take over-the-counter and prescription medicines only as told by your health care provider.  Keep all follow-up visits as told by your health care provider. This is important. Contact a health care provider if:  You have a fever.  You develop new symptoms.  Your symptoms get worse. Get help right away if:  You have pain in your groin that suddenly gets worse.  You have a bulge in your groin that: ? Suddenly gets bigger and does not get smaller. ? Becomes red or purple or painful to the touch.  You are a man and you have a sudden pain in your scrotum, or the size of your scrotum suddenly changes.  You cannot push the hernia back in place by very gently pressing on it when you are lying down. Do not try to force the bulge back in if it will not push in easily.  You have nausea or vomiting that does not go away.  You have a fast heartbeat.  You cannot have a bowel movement or pass gas. These symptoms may represent a serious problem that is an emergency. Do not wait to see if the symptoms will go away. Get medical help right away. Call your local emergency services (911 in the [place] Summary  An inguinal hernia develops when fat or the intestines push through a weak spot in a muscle where your leg meets your lower abdomen (groin).  This condition is caused by having a weak spot in muscles or tissue in your groin.  Symptoms may depend on the size of the hernia, and they may include pain or swelling in your groin. A small inguinal hernia often has no symptoms.  Treatment may not be needed if you do not have symptoms. If you have symptoms or a large  hernia, you may need surgery to repair the hernia.  Avoid lifting heavy objects. Also avoid standing for long amounts of time. This information is not  intended to replace advice given to you by your health care provider. Make sure you discuss any questions you have with your health care provider. Document Revised: 12/01/2017 Document Reviewed: 08/01/2017 Elsevier Patient Education  [employer].

## 2019-12-24 NOTE — Progress Notes (Signed)
Request for Cardiac Clearance faxed over to Dr April Manson office. F: [phone] P: [phone]

## 2019-12-24 NOTE — Progress Notes (Signed)
Cardiac Clearance received from Dr Rennie Plowman office. Patient is optimized for surgery with Low risk.

## 2019-12-25 ENCOUNTER — Telehealth: Payer: Self-pay | Admitting: General Surgery

## 2019-12-25 NOTE — Telephone Encounter (Signed)
Outbound call made to provide the pt w/surgery, pre-admit & COVID date info; however the pt advised he will need to place his surgery on hold for the time being, as it will interfere w/his current Workers Comp claim & tx plan.  Nicholas Torres will reach out when ready to move fwd w/surgery scheduling.  Thank you

## 2019-12-30 NOTE — Telephone Encounter (Signed)
Provider made aware.

## 2020-01-02 ENCOUNTER — Other Ambulatory Visit: Payer: BC Managed Care – PPO

## 2020-01-06 ENCOUNTER — Other Ambulatory Visit: Payer: BC Managed Care – PPO

## 2020-01-08 ENCOUNTER — Ambulatory Visit: Payer: BC Managed Care – PPO | Admitting: Adult Health

## 2020-01-09 ENCOUNTER — Encounter (INDEPENDENT_AMBULATORY_CARE_PROVIDER_SITE_OTHER): Payer: Self-pay | Admitting: Vascular Surgery

## 2020-01-09 ENCOUNTER — Ambulatory Visit (INDEPENDENT_AMBULATORY_CARE_PROVIDER_SITE_OTHER): Payer: BC Managed Care – PPO | Admitting: Vascular Surgery

## 2020-01-09 ENCOUNTER — Other Ambulatory Visit: Payer: Self-pay

## 2020-01-09 VITALS — BP 124/82 | HR 59 | Resp 12 | Ht 65.0 in | Wt 183.0 lb

## 2020-01-09 DIAGNOSIS — I2510 Atherosclerotic heart disease of native coronary artery without angina pectoris: Secondary | ICD-10-CM | POA: Diagnosis not present

## 2020-01-09 DIAGNOSIS — I6521 Occlusion and stenosis of right carotid artery: Secondary | ICD-10-CM | POA: Diagnosis not present

## 2020-01-09 DIAGNOSIS — E782 Mixed hyperlipidemia: Secondary | ICD-10-CM | POA: Diagnosis not present

## 2020-01-09 DIAGNOSIS — I6529 Occlusion and stenosis of unspecified carotid artery: Secondary | ICD-10-CM | POA: Insufficient documentation

## 2020-01-09 DIAGNOSIS — I2583 Coronary atherosclerosis due to lipid rich plaque: Secondary | ICD-10-CM

## 2020-01-09 DIAGNOSIS — I10 Essential (primary) hypertension: Secondary | ICD-10-CM

## 2020-01-09 NOTE — Assessment & Plan Note (Signed)
blood pressure control important in reducing the progression of atherosclerotic disease. On appropriate oral medications.  

## 2020-01-09 NOTE — Patient Instructions (Signed)
Carotid Endarterectomy A carotid endarterectomy is a surgery to remove a blockage in the carotid arteries. The carotid arteries are the large blood vessels on both sides of the neck that supply blood to the brain. Carotid artery disease, also called carotid artery stenosis, is the narrowing or blockage of one or both carotid arteries. Carotid artery disease is usually caused by atherosclerosis, which is a buildup of fat and plaque in the arteries. Some buildup of plaque normally occurs with aging. The plaque may partially or totally block blood flow or cause a clot to form in the carotid arteries. This may cause a stroke. Tell a health care provider about:  Any allergies you have.  All medicines you are taking, including vitamins, herbs, eye drops, creams, and over-the-counter medicines.  Any problems you or family members have had with anesthetic medicines.  Any blood disorders you have.  Any surgeries you have had.  Any medical conditions you have, or have had, including diabetes, kidney problems, and infections.  Whether you are pregnant or may be pregnant. What are the risks? Generally, this is a safe procedure. However, problems may occur, including:  Infection.  Bleeding.  Blood clots.  Allergic reactions to medicines.  Damage to nerves near the carotid arteries. This can cause a hoarse voice or weakness of muscles in your face.  Stroke.  Seizures.  Heart attack (myocardial infarction).  Narrowing of the opened blood vessel (restenosis). This may require another surgery. What happens before the procedure? Staying hydrated Follow instructions from your health care provider about hydration, which may include:  Up to 2 hours before the procedure - you may continue to drink clear liquids, such as water, clear fruit juice, black coffee, and plain tea.  Eating and drinking restrictions Follow instructions from your health care provider about eating and drinking, which may  include:  8 hours before the procedure - stop eating heavy meals or foods, such as meat, fried foods, or fatty foods.  6 hours before the procedure - stop eating light meals or foods, such as toast or cereal.  6 hours before the procedure - stop drinking milk or drinks that contain milk.  2 hours before the procedure - stop drinking clear liquids. Medicines  Ask your health care provider about: ? Changing or stopping your regular medicines. This is especially important if you are taking diabetes medicines or blood thinners. ? Taking medicines such as aspirin and ibuprofen. These medicines can thin your blood. Do not take these medicines unless your health care provider tells you to take them. ? Taking over-the-counter medicines, vitamins, herbs, and supplements. General instructions  Do not use any products that contain nicotine or tobacco for at least 4-6 weeks, or as soon as possible, before the procedure. These products include cigarettes, e-cigarettes, and chewing tobacco. If you need help quitting, ask your health care provider.  You may need to have blood tests, a test to check heart rhythm (electrocardiogram), or a test to check blood flow (angiogram).  Plan to have someone take you home from the hospital or clinic.  Ask your health care provider: ? How your surgery site will be marked. ? What steps will be taken to help prevent infection. These may include:  Removing hair at the surgery site.  Washing skin with a germ-killing soap.  Taking antibiotic medicine. What happens during the procedure?   An IV will be inserted into one of your veins.  You will be given one or more of the following: ?   A medicine to help you relax (sedative). ? A medicine to make you fall asleep (general anesthetic).  The surgeon will make a small incision in your neck to expose the carotid artery.  A tube may be inserted into the carotid artery above and below the blockage. This tube will  allow blood to flow around the blockage during the surgery.  An incision will be made in the carotid artery at the location of the blockage.  The blockage will be removed. In some cases, a section of the carotid artery may be removed and a graft patch may be used to repair the artery.  The carotid artery will be closed with stitches (sutures).  If a tube was inserted into the artery to allow blood flow around the blockage during surgery, the tube will be removed. Once the tube is removed, blood flow through the carotid artery will be restored.  The incision in the neck will be closed with sutures.  A bandage (dressing) will be placed over your incision. The procedure may vary among health care providers and hospitals. What happens after the procedure?  Your blood pressure, heart rate, breathing rate, and blood oxygen level will be monitored until you leave the hospital or clinic.  You may have some pain or an ache in your neck for about 2 weeks. This is normal.  Do not drive for 24 hours if you were given a sedative during your procedure. Summary  A carotid endarterectomy is a surgery to remove a blockage in the carotid arteries.  The carotid arteries are the large blood vessels on both sides of the neck that supply blood to the brain.  Before the procedure, ask your health care provider about changing or stopping your regular medicines.  Follow instructions from your health care provider about eating and drinking before the procedure.  After the procedure, do not drive for 24 hours if you were given a sedative. This information is not intended to replace advice given to you by your health care provider. Make sure you discuss any questions you have with your health care provider. Document Revised: 07/09/2018 Document Reviewed: 07/09/2018 Elsevier Patient Education  2020 [employer]  

## 2020-01-09 NOTE — Assessment & Plan Note (Signed)
CT scan of the carotids which I have independently reviewed.  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  The left carotid artery appears to be fairly clean without significant plaque or disease.  Recommend:  The patient remains asymptomatic with respect to the carotid stenosis.  However, the patient has now progressed and has a lesion the is >75%.  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  The anatomical considerations support surgery over stenting.  This was discussed in detail with the patient.  The patient does indeed need surgery, therefore, cardiac clearance will be arranged. Once cleared the patient will be scheduled for surgery.  The risks, benefits and alternative therapies were reviewed in detail with the patient.  All questions were answered.  The patient agrees to proceed with surgery of the right carotid artery.  Continue antiplatelet therapy as prescribed. Continue management of CAD, HTN and Hyperlipidemia. Healthy heart diet, encouraged exercise at least 4 times per week.
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
## 2020-01-09 NOTE — Assessment & Plan Note (Signed)
Continue cardiac and antihypertensive medications as already ordered and reviewed, no changes at this time. Continue statin as ordered and reviewed, no changes at this time Nitrates PRN for chest pain  

## 2020-01-09 NOTE — Assessment & Plan Note (Signed)
lipid control important in reducing the progression of atherosclerotic disease. Continue statin therapy  

## 2020-01-12 ENCOUNTER — Other Ambulatory Visit: Payer: BC Managed Care – PPO

## 2020-01-13 ENCOUNTER — Encounter (INDEPENDENT_AMBULATORY_CARE_PROVIDER_SITE_OTHER): Payer: BC Managed Care – PPO | Admitting: Vascular Surgery

## 2020-01-14 ENCOUNTER — Telehealth (INDEPENDENT_AMBULATORY_CARE_PROVIDER_SITE_OTHER): Payer: Self-pay

## 2020-01-14 ENCOUNTER — Ambulatory Visit: Admit: 2020-01-14 | Payer: BC Managed Care – PPO | Admitting: General Surgery

## 2020-01-14 SURGERY — REPAIR, HERNIA, INGUINAL, ADULT
Anesthesia: General | Site: Groin | Laterality: Right

## 2020-01-14 NOTE — Telephone Encounter (Signed)
I attempted to contact the patient and a message was left for a return call. 

## 2020-01-15 ENCOUNTER — Encounter (INDEPENDENT_AMBULATORY_CARE_PROVIDER_SITE_OTHER): Payer: Self-pay

## 2020-01-16 ENCOUNTER — Other Ambulatory Visit (INDEPENDENT_AMBULATORY_CARE_PROVIDER_SITE_OTHER): Payer: Self-pay | Admitting: Nurse Practitioner

## 2020-01-19 ENCOUNTER — Other Ambulatory Visit: Payer: Self-pay

## 2020-01-19 ENCOUNTER — Encounter
Admission: RE | Admit: 2020-01-19 | Discharge: 2020-01-19 | Disposition: A | Discharge status: Home / Self Care | Payer: BC Managed Care – PPO | Source: Ambulatory Visit | Attending: Vascular Surgery | Admitting: Vascular Surgery

## 2020-01-19 DIAGNOSIS — Z01818 Encounter for other preprocedural examination: Secondary | ICD-10-CM | POA: Insufficient documentation

## 2020-01-19 HISTORY — DX: Heart failure, unspecified: I50.9

## 2020-01-19 NOTE — Patient Instructions (Signed)
Your procedure is scheduled on: Thursday January 21, 2010 Report to Day Surgery. To find out your arrival time please call [phone] between 1PM - 3PM on Wednesday January 21, 2020.  Remember: Instructions that are not followed completely may result in serious medical risk,  up to and including death, or upon the discretion of your surgeon and anesthesiologist your  surgery may need to be rescheduled.     _X__ 1. Do not eat food after midnight the night before your procedure.                 No gum chewing or hard candies. You may drink clear liquids up to 2 hours                 before you are scheduled to arrive for your surgery- DO not drink clear                 liquids within 2 hours of the start of your surgery.                 Clear Liquids include:  water, apple juice without pulp, clear Gatorade, G2 or                  Gatorade Zero (avoid Red/Purple/Blue), Black Coffee or Tea (Do not add                 anything to coffee or tea).  __X__2.  On the morning of surgery brush your teeth with toothpaste and water, you                may rinse your mouth with mouthwash if you wish.  Do not swallow any toothpaste of mouthwash.     _X__ 3.  No Alcohol for 24 hours before or after surgery.   _X__ 4.  Do Not Smoke or use e-cigarettes For 24 Hours Prior to Your Surgery.                 Do not use any chewable tobacco products for at least 6 hours prior to                 surgery.  __x__  6.  Notify your doctor if there is any change in your medical condition      (cold, fever, infections).     Do not wear jewelry, make-up, hairpins, clips or nail polish. Do not wear lotions, powders, or perfumes. You may wear deodorant. Do not shave 48 hours prior to surgery. Men may shave face and neck. Do not bring valuables to the hospital.    [facility] is not responsible for any belongings or valuables.  Contacts, dentures or bridgework may not be worn into  surgery. Leave your suitcase in the car. After surgery it may be brought to your room. For patients admitted to the hospital, discharge time is determined by your treatment team.   Patients discharged the day of surgery will not be allowed to drive home.   Make arrangements for someone to be with you for the first 24 hours of your Same Day Discharge.   __x__ Take these medicines the morning of surgery with A SIP OF WATER:    1. gabapentin (NEURONTIN) if needed for pain  2. metoprolol tartrate (LOPRESSOR)   __x__ Use CHG Soap as directed  __x__ Stop aspirin on day of surgery per Dew's orders  __x__ Stop Anti-inflammatories on aspirin, naproxen, Aleve and or BC powders.  __x__ Stop supplements until after surgery.    __x__ Do Not start any herbal supplements before your surgery.

## 2020-01-20 ENCOUNTER — Encounter
Admission: RE | Admit: 2020-01-20 | Discharge: 2020-01-20 | Disposition: A | Discharge status: Home / Self Care | Payer: BC Managed Care – PPO | Source: Ambulatory Visit | Attending: Vascular Surgery | Admitting: Vascular Surgery

## 2020-01-20 DIAGNOSIS — Z20822 Contact with and (suspected) exposure to covid-19: Secondary | ICD-10-CM | POA: Insufficient documentation

## 2020-01-20 DIAGNOSIS — I6529 Occlusion and stenosis of unspecified carotid artery: Secondary | ICD-10-CM | POA: Insufficient documentation

## 2020-01-20 DIAGNOSIS — I10 Essential (primary) hypertension: Secondary | ICD-10-CM | POA: Insufficient documentation

## 2020-01-20 DIAGNOSIS — Z01818 Encounter for other preprocedural examination: Secondary | ICD-10-CM | POA: Insufficient documentation

## 2020-01-20 LAB — CBC WITH DIFFERENTIAL/PLATELET
Abs Immature Granulocytes: 0.01 10*3/uL (ref 0.00–0.07)
Basophils Absolute: 0 10*3/uL (ref 0.0–0.1)
Basophils Relative: 0 %
Eosinophils Absolute: 0.1 10*3/uL (ref 0.0–0.5)
Eosinophils Relative: 1 %
HCT: 45.1 % (ref 39.0–52.0)
Hemoglobin: 15 g/dL (ref 13.0–17.0)
Immature Granulocytes: 0 %
Lymphocytes Relative: 55 %
Lymphs Abs: 2.7 10*3/uL (ref 0.7–4.0)
MCH: 30.6 pg (ref 26.0–34.0)
MCHC: 33.3 g/dL (ref 30.0–36.0)
MCV: 92 fL (ref 80.0–100.0)
Monocytes Absolute: 0.5 10*3/uL (ref 0.1–1.0)
Monocytes Relative: 10 %
Neutro Abs: 1.6 10*3/uL — ABNORMAL LOW (ref 1.7–7.7)
Neutrophils Relative %: 34 %
Platelets: 136 10*3/uL — ABNORMAL LOW (ref 150–400)
RBC: 4.9 MIL/uL (ref 4.22–5.81)
RDW: 12.2 % (ref 11.5–15.5)
WBC: 4.8 10*3/uL (ref 4.0–10.5)
nRBC: 0 % (ref 0.0–0.2)

## 2020-01-20 LAB — BASIC METABOLIC PANEL
Anion gap: 12 (ref 5–15)
BUN: 21 mg/dL — ABNORMAL HIGH (ref 6–20)
CO2: 27 mmol/L (ref 22–32)
Calcium: 9.3 mg/dL (ref 8.9–10.3)
Chloride: 99 mmol/L (ref 98–111)
Creatinine, Ser: 1.25 mg/dL — ABNORMAL HIGH (ref 0.61–1.24)
GFR calc Af Amer: >60 mL/min (ref >60)
GFR calc non Af Amer: >60 mL/min (ref >60)
Glucose, Bld: 127 mg/dL — ABNORMAL HIGH (ref 70–99)
Potassium: 3.4 mmol/L — ABNORMAL LOW (ref 3.5–5.1)
Sodium: 138 mmol/L (ref 135–145)

## 2020-01-20 LAB — TYPE AND SCREEN
ABO/RH(D): O POS
Antibody Screen: NEGATIVE

## 2020-01-20 LAB — PROTIME-INR
INR: 1.1 (ref 0.8–1.2)
Prothrombin Time: 14.3 seconds (ref 11.4–15.2)

## 2020-01-20 LAB — APTT: aPTT: 30 seconds (ref 24–36)

## 2020-01-20 LAB — SARS CORONAVIRUS 2 (TAT 6-24 HRS): SARS Coronavirus 2: NEGATIVE

## 2020-01-22 ENCOUNTER — Inpatient Hospital Stay: Payer: BC Managed Care – PPO | Admitting: Anesthesiology

## 2020-01-22 ENCOUNTER — Encounter: Payer: Self-pay | Admitting: Vascular Surgery

## 2020-01-22 ENCOUNTER — Encounter
Admission: RE | Disposition: A | Discharge status: Home / Self Care | Payer: Self-pay | Source: Home / Self Care | Attending: Vascular Surgery

## 2020-01-22 ENCOUNTER — Other Ambulatory Visit: Payer: Self-pay

## 2020-01-22 ENCOUNTER — Inpatient Hospital Stay
Admission: RE | Admit: 2020-01-22 | Discharge: 2020-01-23 | DRG: 039 | Disposition: A | Discharge status: Home / Self Care | Payer: BC Managed Care – PPO | Attending: Vascular Surgery | Admitting: Vascular Surgery

## 2020-01-22 DIAGNOSIS — I2510 Atherosclerotic heart disease of native coronary artery without angina pectoris: Secondary | ICD-10-CM | POA: Diagnosis present

## 2020-01-22 DIAGNOSIS — I252 Old myocardial infarction: Secondary | ICD-10-CM

## 2020-01-22 DIAGNOSIS — I6521 Occlusion and stenosis of right carotid artery: Secondary | ICD-10-CM | POA: Diagnosis present

## 2020-01-22 DIAGNOSIS — Z951 Presence of aortocoronary bypass graft: Secondary | ICD-10-CM | POA: Diagnosis not present

## 2020-01-22 DIAGNOSIS — I110 Hypertensive heart disease with heart failure: Secondary | ICD-10-CM | POA: Diagnosis present

## 2020-01-22 DIAGNOSIS — E7800 Pure hypercholesterolemia, unspecified: Secondary | ICD-10-CM | POA: Diagnosis present

## 2020-01-22 DIAGNOSIS — Z20822 Contact with and (suspected) exposure to covid-19: Secondary | ICD-10-CM | POA: Diagnosis present

## 2020-01-22 DIAGNOSIS — I509 Heart failure, unspecified: Secondary | ICD-10-CM | POA: Diagnosis present

## 2020-01-22 HISTORY — PX: ENDARTERECTOMY: SHX5162

## 2020-01-22 LAB — ABO/RH: ABO/RH(D): O POS

## 2020-01-22 LAB — MRSA PCR SCREENING: MRSA by PCR: NEGATIVE

## 2020-01-22 LAB — GLUCOSE, CAPILLARY: Glucose-Capillary: 104 mg/dL — ABNORMAL HIGH (ref 70–99)

## 2020-01-22 LAB — POTASSIUM: Potassium: 3.4 mmol/L — ABNORMAL LOW (ref 3.5–5.1)

## 2020-01-22 SURGERY — ENDARTERECTOMY, CAROTID
Anesthesia: General | Laterality: Right

## 2020-01-22 MED ORDER — LIDOCAINE HCL (CARDIAC) PF 100 MG/5ML IV SOSY
PREFILLED_SYRINGE | INTRAVENOUS | Status: DC | PRN
  Administered 2020-01-22: 80 mg via INTRAVENOUS

## 2020-01-22 MED ORDER — LACTATED RINGERS IV SOLN: INTRAVENOUS | Status: DC

## 2020-01-22 MED ORDER — MIDAZOLAM HCL 2 MG/2ML IJ SOLN
INTRAMUSCULAR | Status: DC | PRN
  Administered 2020-01-22: 2 mg via INTRAVENOUS

## 2020-01-22 MED ORDER — CLOPIDOGREL BISULFATE 75 MG PO TABS
75.0000 mg | ORAL_TABLET | Freq: Every day | ORAL | Status: DC
  Administered 2020-01-23: 75 mg via ORAL
  Filled 2020-01-22: qty 1

## 2020-01-22 MED ORDER — HEPARIN SODIUM (PORCINE) 1000 UNIT/ML IJ SOLN
INTRAMUSCULAR | Status: DC | PRN
  Administered 2020-01-22: 6000 [IU] via INTRAVENOUS

## 2020-01-22 MED ORDER — DOCUSATE SODIUM 100 MG PO CAPS
100.0000 mg | ORAL_CAPSULE | Freq: Every day | ORAL | Status: DC
  Administered 2020-01-23: 100 mg via ORAL
  Filled 2020-01-22: qty 1

## 2020-01-22 MED ORDER — MAGNESIUM SULFATE 2 GM/50ML IV SOLN: 2.0000 g | Freq: Every day | INTRAVENOUS | Status: DC | PRN

## 2020-01-22 MED ORDER — HYDROMORPHONE HCL 1 MG/ML IJ SOLN: 1.0000 mg | Freq: Once | INTRAMUSCULAR | Status: DC | PRN

## 2020-01-22 MED ORDER — SODIUM CHLORIDE 0.9 % IV SOLN: Freq: Once | INTRAVENOUS | Status: AC

## 2020-01-22 MED ORDER — HEPARIN SODIUM (PORCINE) 1000 UNIT/ML IJ SOLN
INTRAMUSCULAR | Status: AC
  Filled 2020-01-22: qty 1

## 2020-01-22 MED ORDER — DEXAMETHASONE SODIUM PHOSPHATE 10 MG/ML IJ SOLN
INTRAMUSCULAR | Status: DC | PRN
  Administered 2020-01-22: 10 mg via INTRAVENOUS

## 2020-01-22 MED ORDER — GLYCOPYRROLATE 0.2 MG/ML IJ SOLN
INTRAMUSCULAR | Status: DC | PRN
  Administered 2020-01-22: .2 mg via INTRAVENOUS

## 2020-01-22 MED ORDER — FAMOTIDINE IN NACL 20-0.9 MG/50ML-% IV SOLN
20.0000 mg | Freq: Two times a day (BID) | INTRAVENOUS | Status: DC
  Administered 2020-01-22: 20 mg via INTRAVENOUS
  Filled 2020-01-22: qty 50

## 2020-01-22 MED ORDER — SODIUM CHLORIDE 0.9 % IV SOLN: 500.0000 mL | Freq: Once | INTRAVENOUS | Status: DC | PRN

## 2020-01-22 MED ORDER — PROMETHAZINE HCL 25 MG/ML IJ SOLN: 6.2500 mg | INTRAMUSCULAR | Status: DC | PRN

## 2020-01-22 MED ORDER — ONDANSETRON HCL 4 MG/2ML IJ SOLN: 4.0000 mg | Freq: Four times a day (QID) | INTRAMUSCULAR | Status: DC | PRN

## 2020-01-22 MED ORDER — LISINOPRIL 5 MG PO TABS: 10.0000 mg | ORAL_TABLET | Freq: Every day | ORAL | Status: DC

## 2020-01-22 MED ORDER — PHENOL 1.4 % MT LIQD
1.0000 | OROMUCOSAL | Status: DC | PRN
  Filled 2020-01-22: qty 177

## 2020-01-22 MED ORDER — FAMOTIDINE 20 MG PO TABS
ORAL_TABLET | ORAL | Status: AC
  Administered 2020-01-22: 20 mg via ORAL
  Filled 2020-01-22: qty 1

## 2020-01-22 MED ORDER — HEPARIN SODIUM (PORCINE) 5000 UNIT/ML IJ SOLN
INTRAMUSCULAR | Status: AC
  Filled 2020-01-22: qty 1

## 2020-01-22 MED ORDER — REMIFENTANIL HCL 1 MG IV SOLR
INTRAVENOUS | Status: AC
  Filled 2020-01-22: qty 1000

## 2020-01-22 MED ORDER — HEPARIN SODIUM (PORCINE) 10000 UNIT/ML IJ SOLN
INTRAMUSCULAR | Status: AC
  Filled 2020-01-22: qty 1

## 2020-01-22 MED ORDER — NITROGLYCERIN IN D5W 200-5 MCG/ML-% IV SOLN: 5.0000 ug/min | INTRAVENOUS | Status: DC

## 2020-01-22 MED ORDER — LIDOCAINE HCL (PF) 1 % IJ SOLN
INTRAMUSCULAR | Status: AC
  Filled 2020-01-22: qty 30

## 2020-01-22 MED ORDER — FENTANYL CITRATE (PF) 100 MCG/2ML IJ SOLN
INTRAMUSCULAR | Status: AC
  Filled 2020-01-22: qty 2

## 2020-01-22 MED ORDER — CYCLOBENZAPRINE HCL 10 MG PO TABS
10.0000 mg | ORAL_TABLET | Freq: Every day | ORAL | Status: DC
  Administered 2020-01-23: 10 mg via ORAL
  Filled 2020-01-22 (×3): qty 1

## 2020-01-22 MED ORDER — FAMOTIDINE 20 MG PO TABS: 20.0000 mg | ORAL_TABLET | Freq: Once | ORAL | Status: AC

## 2020-01-22 MED ORDER — ALUM & MAG HYDROXIDE-SIMETH 200-200-20 MG/5ML PO SUSP: 15.0000 mL | ORAL | Status: DC | PRN

## 2020-01-22 MED ORDER — REMIFENTANIL HCL 1 MG IV SOLR
INTRAVENOUS | Status: DC | PRN
  Administered 2020-01-22: .01 ug/kg/min via INTRAVENOUS

## 2020-01-22 MED ORDER — ACETAMINOPHEN 325 MG PO TABS
325.0000 mg | ORAL_TABLET | ORAL | Status: DC | PRN
  Administered 2020-01-22 – 2020-01-23 (×2): 650 mg via ORAL
  Filled 2020-01-22 (×2): qty 2

## 2020-01-22 MED ORDER — MIDAZOLAM HCL 2 MG/2ML IJ SOLN
INTRAMUSCULAR | Status: AC
  Filled 2020-01-22: qty 2

## 2020-01-22 MED ORDER — METOPROLOL TARTRATE 5 MG/5ML IV SOLN: 2.0000 mg | INTRAVENOUS | Status: DC | PRN

## 2020-01-22 MED ORDER — HYDROCHLOROTHIAZIDE 12.5 MG PO CAPS
12.5000 mg | ORAL_CAPSULE | Freq: Every day | ORAL | Status: DC
  Administered 2020-01-22: 12.5 mg via ORAL
  Filled 2020-01-22 (×2): qty 1

## 2020-01-22 MED ORDER — NITROGLYCERIN IN D5W 200-5 MCG/ML-% IV SOLN
INTRAVENOUS | Status: AC
  Filled 2020-01-22: qty 250

## 2020-01-22 MED ORDER — SUGAMMADEX SODIUM 200 MG/2ML IV SOLN
INTRAVENOUS | Status: DC | PRN
  Administered 2020-01-22: 200 mg via INTRAVENOUS

## 2020-01-22 MED ORDER — ROCURONIUM BROMIDE 100 MG/10ML IV SOLN
INTRAVENOUS | Status: DC | PRN
  Administered 2020-01-22: 50 mg via INTRAVENOUS

## 2020-01-22 MED ORDER — METOPROLOL TARTRATE 25 MG PO TABS: 12.5000 mg | ORAL_TABLET | Freq: Every day | ORAL | Status: DC

## 2020-01-22 MED ORDER — CHLORHEXIDINE GLUCONATE CLOTH 2 % EX PADS
6.0000 | MEDICATED_PAD | Freq: Once | CUTANEOUS | Status: AC
  Administered 2020-01-22: 6 via TOPICAL

## 2020-01-22 MED ORDER — POTASSIUM CHLORIDE CRYS ER 20 MEQ PO TBCR
20.0000 meq | EXTENDED_RELEASE_TABLET | Freq: Once | ORAL | Status: AC
  Administered 2020-01-22: 20 meq via ORAL
  Filled 2020-01-22: qty 1

## 2020-01-22 MED ORDER — HYDRALAZINE HCL 20 MG/ML IJ SOLN: 5.0000 mg | INTRAMUSCULAR | Status: DC | PRN

## 2020-01-22 MED ORDER — CEFAZOLIN SODIUM-DEXTROSE 2-4 GM/100ML-% IV SOLN
INTRAVENOUS | Status: AC
  Filled 2020-01-22: qty 100

## 2020-01-22 MED ORDER — FENTANYL CITRATE (PF) 100 MCG/2ML IJ SOLN: 25.0000 ug | INTRAMUSCULAR | Status: DC | PRN

## 2020-01-22 MED ORDER — ACETAMINOPHEN 650 MG RE SUPP: 325.0000 mg | RECTAL | Status: DC | PRN

## 2020-01-22 MED ORDER — MORPHINE SULFATE (PF) 2 MG/ML IV SOLN: 2.0000 mg | INTRAVENOUS | Status: DC | PRN

## 2020-01-22 MED ORDER — POTASSIUM CHLORIDE CRYS ER 20 MEQ PO TBCR: 20.0000 meq | EXTENDED_RELEASE_TABLET | Freq: Every day | ORAL | Status: DC | PRN

## 2020-01-22 MED ORDER — EPHEDRINE SULFATE 50 MG/ML IJ SOLN
INTRAMUSCULAR | Status: DC | PRN
  Administered 2020-01-22: 10 mg via INTRAVENOUS

## 2020-01-22 MED ORDER — CHLORHEXIDINE GLUCONATE CLOTH 2 % EX PADS: 6.0000 | MEDICATED_PAD | Freq: Once | CUTANEOUS | Status: DC

## 2020-01-22 MED ORDER — GABAPENTIN 300 MG PO CAPS
300.0000 mg | ORAL_CAPSULE | Freq: Three times a day (TID) | ORAL | Status: DC
  Administered 2020-01-22: 300 mg via ORAL
  Filled 2020-01-22: qty 1

## 2020-01-22 MED ORDER — ASPIRIN EC 81 MG PO TBEC
81.0000 mg | DELAYED_RELEASE_TABLET | Freq: Every day | ORAL | Status: DC
  Administered 2020-01-23: 81 mg via ORAL
  Filled 2020-01-22: qty 1

## 2020-01-22 MED ORDER — ONDANSETRON HCL 4 MG/2ML IJ SOLN
INTRAMUSCULAR | Status: DC | PRN
  Administered 2020-01-22: 4 mg via INTRAVENOUS

## 2020-01-22 MED ORDER — CEFAZOLIN SODIUM-DEXTROSE 2-4 GM/100ML-% IV SOLN
2.0000 g | Freq: Three times a day (TID) | INTRAVENOUS | Status: AC
  Administered 2020-01-22 – 2020-01-23 (×2): 2 g via INTRAVENOUS
  Filled 2020-01-22 (×2): qty 100

## 2020-01-22 MED ORDER — FLUTICASONE PROPIONATE 50 MCG/ACT NA SUSP
1.0000 | Freq: Every day | NASAL | Status: DC
  Filled 2020-01-22: qty 16

## 2020-01-22 MED ORDER — OXYCODONE-ACETAMINOPHEN 5-325 MG PO TABS: 1.0000 | ORAL_TABLET | ORAL | Status: DC | PRN

## 2020-01-22 MED ORDER — PROPOFOL 500 MG/50ML IV EMUL
INTRAVENOUS | Status: AC
  Filled 2020-01-22: qty 50

## 2020-01-22 MED ORDER — PROPOFOL 10 MG/ML IV BOLUS
INTRAVENOUS | Status: DC | PRN
  Administered 2020-01-22: 150 mg via INTRAVENOUS

## 2020-01-22 MED ORDER — FENTANYL CITRATE (PF) 100 MCG/2ML IJ SOLN
INTRAMUSCULAR | Status: DC | PRN
  Administered 2020-01-22: 50 ug via INTRAVENOUS
  Administered 2020-01-22: 25 ug via INTRAVENOUS

## 2020-01-22 MED ORDER — ONDANSETRON HCL 4 MG/2ML IJ SOLN
INTRAMUSCULAR | Status: AC
  Filled 2020-01-22: qty 2

## 2020-01-22 MED ORDER — CEFAZOLIN SODIUM-DEXTROSE 2-4 GM/100ML-% IV SOLN
2.0000 g | INTRAVENOUS | Status: AC
  Administered 2020-01-22: 2 g via INTRAVENOUS

## 2020-01-22 MED ORDER — ESMOLOL HCL-SODIUM CHLORIDE 2000 MG/100ML IV SOLN
25.0000 ug/kg/min | INTRAVENOUS | Status: DC
  Filled 2020-01-22: qty 100

## 2020-01-22 MED ORDER — LABETALOL HCL 5 MG/ML IV SOLN: 10.0000 mg | INTRAVENOUS | Status: DC | PRN

## 2020-01-22 MED ORDER — SODIUM CHLORIDE 0.9 % IV SOLN: INTRAVENOUS | Status: DC

## 2020-01-22 MED ORDER — GUAIFENESIN-DM 100-10 MG/5ML PO SYRP: 15.0000 mL | ORAL_SOLUTION | ORAL | Status: DC | PRN

## 2020-01-22 MED ORDER — PHENYLEPHRINE HCL (PRESSORS) 10 MG/ML IV SOLN
INTRAVENOUS | Status: DC | PRN
  Administered 2020-01-22 (×2): 100 ug via INTRAVENOUS
  Administered 2020-01-22: 200 ug via INTRAVENOUS

## 2020-01-22 MED ORDER — SODIUM CHLORIDE 0.9 % IV SOLN
INTRAVENOUS | Status: DC | PRN
  Administered 2020-01-22: 50 ug/min via INTRAVENOUS

## 2020-01-22 MED ORDER — LISINOPRIL-HYDROCHLOROTHIAZIDE 10-12.5 MG PO TABS: 1.0000 | ORAL_TABLET | Freq: Every day | ORAL | Status: DC

## 2020-01-22 MED ORDER — LIDOCAINE HCL 1 % IJ SOLN
INTRAMUSCULAR | Status: DC | PRN
  Administered 2020-01-22: 10 mL

## 2020-01-22 MED ORDER — ROSUVASTATIN CALCIUM 10 MG PO TABS
10.0000 mg | ORAL_TABLET | Freq: Every day | ORAL | Status: DC
  Administered 2020-01-23: 10 mg via ORAL
  Filled 2020-01-22: qty 1

## 2020-01-22 MED ORDER — HEMOSTATIC AGENTS (NO CHARGE) OPTIME
TOPICAL | Status: DC | PRN
  Administered 2020-01-22: 1 via TOPICAL

## 2020-01-22 MED ORDER — FIBRIN SEALANT 2 ML SINGLE DOSE KIT
PACK | CUTANEOUS | Status: DC | PRN
  Administered 2020-01-22: 2 mL via TOPICAL

## 2020-01-22 SURGICAL SUPPLY — 63 items
ADH SKN CLS APL DERMABOND .7 (GAUZE/BANDAGES/DRESSINGS) ×1
BAG DECANTER FOR FLEXI CONT (MISCELLANEOUS) ×3 IMPLANT
BLADE SURG 15 STRL LF DISP TIS (BLADE) ×1 IMPLANT
BLADE SURG 15 STRL SS (BLADE) ×3
BLADE SURG SZ11 CARB STEEL (BLADE) ×3 IMPLANT
BOOT SUTURE AID YELLOW STND (SUTURE) ×3 IMPLANT
BRUSH SCRUB EZ  4% CHG (MISCELLANEOUS) ×3
BRUSH SCRUB EZ 4% CHG (MISCELLANEOUS) ×1 IMPLANT
CANISTER SUCT 1200ML W/VALVE (MISCELLANEOUS) ×3 IMPLANT
CHLORAPREP W/TINT 26ML (MISCELLANEOUS) ×3 IMPLANT
COVER WAND RF STERILE (DRAPES) ×3 IMPLANT
DERMABOND ADVANCED (GAUZE/BANDAGES/DRESSINGS) ×2
DERMABOND ADVANCED .7 DNX12 (GAUZE/BANDAGES/DRESSINGS) ×1 IMPLANT
DRAPE 3/4 80X56 (DRAPES) ×3 IMPLANT
DRAPE INCISE IOBAN 66X45 STRL (DRAPES) ×3 IMPLANT
DRAPE LAPAROTOMY 77X122 PED (DRAPES) ×3 IMPLANT
ELECT CAUTERY BLADE 6.4 (BLADE) ×3 IMPLANT
ELECT REM PT RETURN 9FT ADLT (ELECTROSURGICAL) ×3
ELECTRODE REM PT RTRN 9FT ADLT (ELECTROSURGICAL) ×1 IMPLANT
GLOVE BIO SURGEON STRL SZ7 (GLOVE) ×11 IMPLANT
GLOVE INDICATOR 7.5 STRL GRN (GLOVE) ×3 IMPLANT
GOWN STRL REUS W/ TWL LRG LVL3 (GOWN DISPOSABLE) ×2 IMPLANT
GOWN STRL REUS W/ TWL XL LVL3 (GOWN DISPOSABLE) ×2 IMPLANT
GOWN STRL REUS W/TWL LRG LVL3 (GOWN DISPOSABLE) ×6
GOWN STRL REUS W/TWL XL LVL3 (GOWN DISPOSABLE) ×6
HEMOSTAT SURGICEL 2X3 (HEMOSTASIS) ×3 IMPLANT
IV NS 250ML (IV SOLUTION) ×3
IV NS 250ML BAXH (IV SOLUTION) ×1 IMPLANT
KIT TURNOVER KIT A (KITS) ×3 IMPLANT
LABEL OR SOLS (LABEL) ×3 IMPLANT
LOOP RED MAXI  1X406MM (MISCELLANEOUS) ×4
LOOP VESSEL MAXI  1X406 RED (MISCELLANEOUS) ×2
LOOP VESSEL MAXI 1X406 RED (MISCELLANEOUS) ×2 IMPLANT
LOOP VESSEL MINI 0.8X406 BLUE (MISCELLANEOUS) ×1 IMPLANT
LOOPS BLUE MINI 0.8X406MM (MISCELLANEOUS) ×2
NDL FILTER BLUNT 18X1 1/2 (NEEDLE) ×1 IMPLANT
NDL HYPO 25X1 1.5 SAFETY (NEEDLE) ×1 IMPLANT
NEEDLE FILTER BLUNT 18X 1/2SAF (NEEDLE) ×2
NEEDLE FILTER BLUNT 18X1 1/2 (NEEDLE) ×1 IMPLANT
NEEDLE HYPO 25X1 1.5 SAFETY (NEEDLE) ×3 IMPLANT
NS IRRIG 500ML POUR BTL (IV SOLUTION) ×3 IMPLANT
PACK BASIN MAJOR ARMC (MISCELLANEOUS) ×3 IMPLANT
PATCH CAROTID ECM VASC 1X10 (Prosthesis & Implant Heart) ×3 IMPLANT
PENCIL ELECTRO HAND CTR (MISCELLANEOUS) IMPLANT
SHUNT W TPORT 9FR PRUITT F3 (SHUNT) ×3 IMPLANT
SUT MNCRL 4-0 (SUTURE) ×3
SUT MNCRL 4-0 27XMFL (SUTURE) ×1
SUT PROLENE 6 0 BV (SUTURE) ×12 IMPLANT
SUT PROLENE 7 0 BV 1 (SUTURE) ×6 IMPLANT
SUT SILK 2 0 (SUTURE) ×3
SUT SILK 2-0 18XBRD TIE 12 (SUTURE) ×1 IMPLANT
SUT SILK 3 0 (SUTURE) ×3
SUT SILK 3-0 18XBRD TIE 12 (SUTURE) ×1 IMPLANT
SUT SILK 4 0 (SUTURE) ×3
SUT SILK 4-0 18XBRD TIE 12 (SUTURE) ×1 IMPLANT
SUT VIC AB 3-0 SH 27 (SUTURE) ×6
SUT VIC AB 3-0 SH 27X BRD (SUTURE) ×2 IMPLANT
SUTURE MNCRL 4-0 27XMF (SUTURE) ×1 IMPLANT
SYR 10ML LL (SYRINGE) ×6 IMPLANT
SYR 20ML LL LF (SYRINGE) ×3 IMPLANT
TRAY FOLEY MTR SLVR 16FR STAT (SET/KITS/TRAYS/PACK) ×3 IMPLANT
TUBING CONNECTING 10 (TUBING) IMPLANT
TUBING CONNECTING 10' (TUBING)

## 2020-01-22 NOTE — Progress Notes (Signed)
Dr. Lucky Cowboy notified of pt blood pressure's and potassium level. Verbal orders were given and placed. Will cont to monitor.

## 2020-01-22 NOTE — H&P (Signed)
[facility] History & Physical Update  The patient was interviewed and re-examined.  The patient's previous History and Physical has been reviewed and is unchanged.  There is no change in the plan of care. We plan to proceed with the scheduled procedure.  Leotis Pain, MD  01/22/2020, 1:27 PM

## 2020-01-22 NOTE — Progress Notes (Signed)
Pt is A&OX4. Surgical site intact. Foley intact. VSS. Per Dr. Lucky Cowboy hold the Lisinopril and give the hydrochlorothiazide with blood pressures on the soft side. Pt admitted to unit. Updated on POC. In no acute distress a this time. Will continue to monitor.

## 2020-01-22 NOTE — Anesthesia Preprocedure Evaluation (Signed)
Anesthesia Evaluation  Patient identified by MRN, date of birth, ID band Patient awake    Reviewed: Allergy & Precautions, NPO status , Patient's Chart, lab work & pertinent test results, reviewed documented beta blocker date and time   History of Anesthesia Complications Negative for: history of anesthetic complications  Airway Mallampati: II  TM Distance: >3 FB Neck ROM: Full    Dental no notable dental hx. (+) Dental Advisory Given, Teeth Intact   Pulmonary neg pulmonary ROS,    Pulmonary exam normal breath sounds clear to auscultation       Cardiovascular Exercise Tolerance: Good hypertension, Pt. on medications and Pt. on home beta blockers (-) angina+ CAD, + Past MI, + CABG and +CHF  (-) Cardiac Stents (-) dysrhythmias (-) Valvular Problems/Murmurs Rhythm:Regular Rate:Normal     Neuro/Psych negative neurological ROS  negative psych ROS   GI/Hepatic negative GI ROS, Neg liver ROS,   Endo/Other  negative endocrine ROS  Renal/GU negative Renal ROS  negative genitourinary   Musculoskeletal negative musculoskeletal ROS (+)   Abdominal   Peds negative pediatric ROS (+)  Hematology negative hematology ROS (+)   Anesthesia Other Findings Denies esophageal pathology or gastric ulcers  Reproductive/Obstetrics negative OB ROS                             Lab Results  Component Value Date   WBC 4.8 01/20/2020   HGB 15.0 01/20/2020   HCT 45.1 01/20/2020   MCV 92.0 01/20/2020   PLT 136 (L) 01/20/2020   Lab Results  Component Value Date   CREATININE 1.25 (H) 01/20/2020   BUN 21 (H) 01/20/2020   NA 138 01/20/2020   K 3.4 (L) 01/20/2020   CL 99 01/20/2020   CO2 27 01/20/2020   Lab Results  Component Value Date   INR 1.1 01/20/2020   INR 1.18 08/11/2016   INR 1.55 07/26/2016   07/2016 EKG: sinus bradycardia.  Anesthesia Physical  Anesthesia Plan  ASA: III  Anesthesia Plan:  General   Post-op Pain Management:    Induction: Intravenous  PONV Risk Score and Plan: 2 and Ondansetron, Dexamethasone, Midazolam, Promethazine and Treatment may vary due to age or medical condition  Airway Management Planned: Oral ETT  Additional Equipment: Arterial line, CVP, PA Cath, Ultrasound Guidance Line Placement and TEE  Intra-op Plan:   Post-operative Plan: Post-operative intubation/ventilation  Informed Consent: I have reviewed the patients History and Physical, chart, labs and discussed the procedure including the risks, benefits and alternatives for the proposed anesthesia with the patient or authorized representative who has indicated his/her understanding and acceptance.     Dental advisory given  Plan Discussed with: CRNA  Anesthesia Plan Comments:         Anesthesia Quick Evaluation

## 2020-01-22 NOTE — Op Note (Signed)
[facility]   OPERATIVE NOTE  PROCEDURE:   1.  Right carotid endarterectomy with CorMatrix arterial patch reconstruction  PRE-OPERATIVE DIAGNOSIS: 1.  Right carotid stenosis 2. CAD s/p CABG  POST-OPERATIVE DIAGNOSIS: same as above   SURGEON: Leotis Pain, MD  ASSISTANT(S): Hezzie Bump, PA-C  ANESTHESIA: general  ESTIMATED BLOOD LOSS: 25 cc  FINDING(S): 1.  right carotid plaque.  SPECIMEN(S):  Carotid plaque (sent to Pathology)  INDICATIONS:   Nicholas Torres is a 59 y.o. male who presents with right carotid stenosis of 80%.  I discussed with the patient the risks, benefits, and alternatives to carotid endarterectomy.  I discussed the differences between carotid stenting and carotid endarterectomy. I discussed the procedural details of carotid endarterectomy with the patient.  The patient is aware that the risks of carotid endarterectomy include but are not limited to: bleeding, infection, stroke, myocardial infarction, death, cranial nerve injuries both temporary and permanent, neck hematoma, possible airway compromise, labile blood pressure post-operatively, cerebral hyperperfusion syndrome, and possible need for additional interventions in the future. The patient is aware of the risks and agrees to proceed forward with the procedure.  DESCRIPTION: After full informed written consent was obtained from the patient, the patient was brought back to the operating room and placed supine upon the operating table.  Prior to induction, the patient received IV antibiotics.  After obtaining adequate anesthesia, the patient was placed into a modified beach chair position with a shoulder roll in place and the patient's neck slightly hyperextended and rotated away from the surgical site.  The patient was prepped in the standard fashion for a carotid endarterectomy.  I made an incision anterior to the sternocleidomastoid muscle and dissected down through the subcutaneous tissue.   The platysmas was opened with electrocautery.  Then I dissected down to the internal jugular vein and facial vein.  The facial vein is ligated and divided between 2-0 silk ties.  This was dissected posteriorly until I obtained visualization of the common carotid artery.  This was dissected out and then a vessel loop was placed around the common carotid artery.  I then dissected in a periadventitial fashion along the common carotid artery up to the bifurcation.  I then identified the external carotid artery and the superior thyroid artery.  I placed a vessel loop around the superior thyroid artery, and I also dissected out the external carotid artery and placed a vessel loop around it. In the process of this dissection, the hypoglossal nerve was identified and protected from harm.  I then dissected out the internal carotid artery until I identified an area in the internal carotid artery clearly above the stenosis.  I dissected slightly distal to this area, and placed a vessel loop around the artery.  At this point, we gave the patient 6000 units of intravenous heparin.  After this was allowed to circulate for several minutes, I pulled up control on the vessel loops to clamp the internal carotid artery, external carotid artery, superior thyroid artery, and then the common carotid artery.  I then made an arteriotomy in the common carotid artery with a 11 blade, and extended the arteriotomy with a Potts scissor down into the common carotid artery, then I carried the arteriotomy through the bifurcation into the internal carotid artery until I reached an area that was not diseased.  At this point, I took the Pruitt-[place] shunt that previously been prepared and I inserted it into the internal carotid artery first, and then into the  common carotid artery taking care to flush and de-air prior to release of control. At this point, I started the endarterectomy in the common carotid artery with a Penfield elevator and carried  this dissection down into the common carotid artery circumferentially.  Then I transected the plaque at a segment where it was adherent and transected the plaque with Potts scissors.  I then carried this dissection up into the external carotid artery.  The plaque was extracted by unclamping the external carotid artery and performing an eversion endarterectomy.  The dissection was then carried into the internal carotid artery where a nice feathered end point was created with gentle traction.  I passed the plaque off the field as a specimen. At this point I removed all loose flecks and remaining disease possible.  At this point, I was satisfied that the minimal remaining disease was densely adherent to the wall and wall integrity was intact. The distal endpoint was tacked down with three 7-0 Prolene sutures.  I then fashioned a CorMatrix arterial patch for the artery and sewed it in place with two running stitch of 6-0 Prolene.  I started at the distal endpoint and ran one half the length of the arteriotomy.  I then cut and beveled the patch to an appropriate length to match the arteriotomy.  I started the second 6-0 Prolene at the proximal end point.  The medial suture line was completed and the lateral suture line was run approximately one quarter the length of the arteriotomy.  Prior to completing this patch angioplasty, I removed the shunt first from the internal carotid artery, from which there was excellent backbleeding, and clamped it.  Then I removed the shunt from the common carotid artery, from which there was excellent antegrade bleeding, and then clamped it.  At this point, I allowed the external carotid artery to backbleed, which was excellent.  Then I instilled heparinized saline in this patched artery and then completed the patch angioplasty in the usual fashion.  First, I released the clamp on the external carotid artery, then I released it on the common carotid artery.  After waiting a few seconds, I  then released it on the internal carotid artery. Several minutes of pressure were held and 6-0 Prolene patch sutures were used as need for hemostasis.  At this point, I placed Surgicel and Evicel topical hemostatic agents.  There was no more active bleeding in the surgical site.  The sternocleidomastoid space was closed with three interrupted 3-0 Vicryl sutures. I then reapproximated the platysma muscle with a running stitch of 3-0 Vicryl.  The skin was then closed with a running subcuticular 4-0 Monocryl.  The skin was then cleaned, dried and Dermabond was used to reinforce the skin closure.  The patient awakened and was taken to the recovery room in stable condition, following commands and moving all four extremities without any apparent deficits.    COMPLICATIONS: none  CONDITION: stable  Leotis Pain  01/22/2020, 4:05 PM    This note was created with Dragon Medical transcription system. Any errors in dictation are purely unintentional.

## 2020-01-22 NOTE — Anesthesia Procedure Notes (Signed)
Procedure Name: Intubation Date/Time: 01/22/2020 2:25 PM Performed by: Justus Memory, CRNA Pre-anesthesia Checklist: Patient identified, Patient being monitored, Timeout performed, Emergency Drugs available and Suction available Patient Re-evaluated:Patient Re-evaluated prior to induction Oxygen Delivery Method: Circle system utilized Preoxygenation: Pre-oxygenation with 100% oxygen Induction Type: IV induction Ventilation: Mask ventilation without difficulty Laryngoscope Size: 3 and McGraph Grade View: Grade I Tube type: Oral Tube size: 7.0 mm Number of attempts: 1 Airway Equipment and Method: Stylet and Video-laryngoscopy Placement Confirmation: ETT inserted through vocal cords under direct vision,  positive ETCO2 and breath sounds checked- equal and bilateral Secured at: 21 cm Tube secured with: Tape Dental Injury: Teeth and Oropharynx as per pre-operative assessment

## 2020-01-22 NOTE — Transfer of Care (Signed)
Immediate Anesthesia Transfer of Care Note  Patient: Nicholas Torres  Procedure(s) Performed: ENDARTERECTOMY CAROTID (Right )  Patient Location: PACU  Anesthesia Type:General  Level of Consciousness: sedated  Airway & Oxygen Therapy: Patient Spontanous Breathing and Patient connected to face mask oxygen  Post-op Assessment: Report given to RN and Post -op Vital signs reviewed and stable  Post vital signs: Reviewed and stable  Last Vitals:  Vitals Value Taken Time  BP 106/77 01/22/20 1659  Temp 36.7 C 01/22/20 1649  Pulse 88 01/22/20 1659  Resp 6 01/22/20 1659  SpO2 100 % 01/22/20 1659  Vitals shown include unvalidated device data.  Last Pain:  Vitals:   01/22/20 1649  TempSrc:   PainSc: Asleep         Complications: No apparent anesthesia complications

## 2020-01-23 DIAGNOSIS — I6521 Occlusion and stenosis of right carotid artery: Principal | ICD-10-CM

## 2020-01-23 LAB — CBC
HCT: 37.7 % — ABNORMAL LOW (ref 39.0–52.0)
Hemoglobin: 12.5 g/dL — ABNORMAL LOW (ref 13.0–17.0)
MCH: 30.5 pg (ref 26.0–34.0)
MCHC: 33.2 g/dL (ref 30.0–36.0)
MCV: 92 fL (ref 80.0–100.0)
Platelets: 123 10*3/uL — ABNORMAL LOW (ref 150–400)
RBC: 4.1 MIL/uL — ABNORMAL LOW (ref 4.22–5.81)
RDW: 12.5 % (ref 11.5–15.5)
WBC: 6.9 10*3/uL (ref 4.0–10.5)
nRBC: 0 % (ref 0.0–0.2)

## 2020-01-23 LAB — BASIC METABOLIC PANEL
Anion gap: 7 (ref 5–15)
BUN: 18 mg/dL (ref 6–20)
CO2: 23 mmol/L (ref 22–32)
Calcium: 8.3 mg/dL — ABNORMAL LOW (ref 8.9–10.3)
Chloride: 104 mmol/L (ref 98–111)
Creatinine, Ser: 1.2 mg/dL (ref 0.61–1.24)
GFR calc Af Amer: >60 mL/min (ref >60)
GFR calc non Af Amer: >60 mL/min (ref >60)
Glucose, Bld: 116 mg/dL — ABNORMAL HIGH (ref 70–99)
Potassium: 4.2 mmol/L (ref 3.5–5.1)
Sodium: 134 mmol/L — ABNORMAL LOW (ref 135–145)

## 2020-01-23 MED ORDER — SODIUM CHLORIDE 0.9 % IV BOLUS
1000.0000 mL | INTRAVENOUS | Status: AC
  Administered 2020-01-23: 1000 mL via INTRAVENOUS

## 2020-01-23 MED ORDER — OXYCODONE-ACETAMINOPHEN 5-325 MG PO TABS: 1.0000 | ORAL_TABLET | Freq: Four times a day (QID) | ORAL | Status: DC | PRN

## 2020-01-23 MED ORDER — OXYCODONE-ACETAMINOPHEN 5-325 MG PO TABS: 1.0000 | ORAL_TABLET | Freq: Four times a day (QID) | ORAL | 0 refills | Status: DC | PRN

## 2020-01-23 MED ORDER — CLOPIDOGREL BISULFATE 75 MG PO TABS: 75.0000 mg | ORAL_TABLET | Freq: Every day | ORAL | 0 refills | Status: DC

## 2020-01-23 MED ORDER — LACTATED RINGERS IV BOLUS
1000.0000 mL | Freq: Once | INTRAVENOUS | Status: AC
  Administered 2020-01-23: 1000 mL via INTRAVENOUS

## 2020-01-23 MED ORDER — FAMOTIDINE 20 MG PO TABS
20.0000 mg | ORAL_TABLET | Freq: Two times a day (BID) | ORAL | Status: DC
  Administered 2020-01-23: 20 mg via ORAL
  Filled 2020-01-23 (×2): qty 1

## 2020-01-23 NOTE — Discharge Instructions (Addendum)
Vascular Surgery Discharge Instructions  1) You may shower as of Sunday.  Gently clean your incision with soap and water.  Gently pat dry. 2) The Dermabond or purple glue that is covering your incision will fall off on its own.  Please do not pull it off. 3) No driving while on pain medication or until your first follow-up in our office. 4) No strenuous activity or lifting greater than 10 pounds until you are cleared in our office during your first follow-up visit. 5) Do not take your blood pressure medication today - you can start it tomorrow if your blood pressure is at least 120/80

## 2020-01-23 NOTE — Progress Notes (Addendum)
   01/23/20 1435  Vitals  BP 105/70  MAP (mmHg) 81  ECG Heart Rate 63  Resp 10  Oxygen Therapy  SpO2 100 %  O2 Device Room Air  MEWS Score  MEWS Temp 0  MEWS Systolic 0  MEWS Pulse 0  MEWS RR 1  MEWS LOC 0  MEWS Score 1  MEWS Score Color Green   Pt in no acute distress. VSS. Surgical site intact. Pt walked 2 laps around nurses station with stand by assist no hand on. Tolerated well, no dizziness. Tolerating diet. Urinating without difficulty. RN went over discharge instructions with pt and his wife. Both verbalized understanding. Dr. Lucky Cowboy at bedside with verbal discharge instructions.

## 2020-01-23 NOTE — Progress Notes (Signed)
PHARMACIST - PHYSICIAN COMMUNICATION  CONCERNING: IV to Oral Route Change Policy  RECOMMENDATION: This patient is receiving famotidine by the intravenous route.  Based on criteria approved by the Pharmacy and Therapeutics Committee, the intravenous medication(s) is/are being converted to the equivalent oral dose form(s).   DESCRIPTION: These criteria include:  The patient is eating (either orally or via tube) and/or has been taking other orally administered medications for a least 24 hours  The patient has no evidence of active gastrointestinal bleeding or impaired GI absorption (gastrectomy, short bowel, patient on TNA or NPO).  If you have questions about this conversion, please contact the Pharmacy Department at [phone].  Ariyah Sedlack L, [facility] 01/23/2020 8:47 AM

## 2020-01-23 NOTE — Discharge Summary (Signed)
[facility]    Discharge Summary  Patient ID:  Nicholas Torres MRN: [id number] DOB/AGE: 06-09-1961 59 y.o.  Admit date: 01/22/2020 Discharge date: 01/23/2020 Date of Surgery: 01/22/2020 Surgeon: Surgeon(s): Algernon Huxley, MD  Admission Diagnosis: Carotid stenosis, right [I65.21]  Discharge Diagnoses:  Carotid stenosis, right [I65.21]  Secondary Diagnoses: Past Medical History:  Diagnosis Date  . CAD (coronary artery disease) 2017  . CHF (congestive heart failure) ([place])   . High cholesterol   . Hypertension   . Myocardial infarction ([place]) 2017  . Pollen allergy    Discharged Condition: Good  HPI / Hospital Course:  Nicholas Torres is a 59 y.o. male who presents with right carotid stenosis of 80%.  I discussed with the patient the risks, benefits, and alternatives to carotid endarterectomy.  I discussed the differences between carotid stenting and carotid endarterectomy. I discussed the procedural details of carotid endarterectomy with the patient.  The patient is aware that the risks of carotid endarterectomy include but are not limited to: bleeding, infection, stroke, myocardial infarction, death, cranial nerve injuries both temporary and permanent, neck hematoma, possible airway compromise, labile blood pressure post-operatively, cerebral hyperperfusion syndrome, and possible need for additional interventions in the future. The patient is aware of the risks and agrees to proceed forward with the procedure. On 01/22/20, the patient underwent:  Procedure(s): ENDARTERECTOMY CAROTID (RIGHT)  Extubated: POD # 0  Physical exam:  Alert and oriented x3, no acute distress Face: Symmetrical, tongue midline Neck:  Incision: Clean dry intact.  No swelling or ecchymosis noted.  Trachea midline Cardiovascular: Regular rate and rhythm Pulmonary: Clear to auscultation bilaterally Abdomen: Soft, nontender, nondistended positive bowel sounds GU: Foley  removed Extremities: Lower extremity warm distally to toes. Neuro: Intact, no deficits noted  Labs as below  Complications: None  Consults: None  Significant Diagnostic Studies: CBC Lab Results  Component Value Date   WBC 6.9 01/23/2020   HGB 12.5 (L) 01/23/2020   HCT 37.7 (L) 01/23/2020   MCV 92.0 01/23/2020   PLT 123 (L) 01/23/2020   BMET    Component Value Date/Time   NA 134 (L) 01/23/2020 0557   K 4.2 01/23/2020 0557   CL 104 01/23/2020 0557   CO2 23 01/23/2020 0557   GLUCOSE 116 (H) 01/23/2020 0557   BUN 18 01/23/2020 0557   CREATININE 1.20 01/23/2020 0557   CREATININE 1.17 07/10/2013 0948   CALCIUM 8.3 (L) 01/23/2020 0557   GFRNONAA >60 01/23/2020 0557   GFRAA >60 01/23/2020 0557   COAG Lab Results  Component Value Date   INR 1.1 01/20/2020   INR 1.18 08/11/2016   INR 1.55 07/26/2016   Disposition:  Discharge to :Home  Allergies as of 01/23/2020      Reactions   No Known Allergies       Medication List    TAKE these medications   aspirin 325 MG EC tablet Take 1 tablet (325 mg total) by mouth daily.   clopidogrel 75 MG tablet Commonly known as: PLAVIX Take 1 tablet (75 mg total) by mouth daily at 6 (six) AM. Start taking on: January 24, 2020   cyclobenzaprine 10 MG tablet Commonly known as: FLEXERIL Take 10 mg by mouth at bedtime.   Evolocumab 140 MG/ML Soaj Inject into the skin.   fluticasone 50 MCG/ACT nasal spray Commonly known as: FLONASE Place 1 spray into both nostrils daily.   gabapentin 300 MG capsule Commonly known as: NEURONTIN Take 300 mg by mouth 3 (three) times  daily.   ibuprofen 600 MG tablet Commonly known as: ADVIL Take 1 tablet (600 mg total) by mouth every 6 (six) hours as needed.   lisinopril-hydrochlorothiazide 10-12.5 MG tablet Commonly known as: ZESTORETIC Take 1 tablet by mouth daily.   metoprolol tartrate 25 MG tablet Commonly known as: LOPRESSOR Take 0.5 tablets (12.5 mg total) by mouth daily.    oxyCODONE-acetaminophen 5-325 MG tablet Commonly known as: PERCOCET/ROXICET Take 1 tablet by mouth every 6 (six) hours as needed for moderate pain or severe pain.   Praluent 150 MG/ML Soaj Generic drug: Alirocumab Inject into the skin every 30 (thirty) days.   rosuvastatin 10 MG tablet Commonly known as: CRESTOR Take 10 mg by mouth daily.      Verbal and written Discharge instructions given to the patient. Wound care per Discharge AVS Follow-up Information    Kris Hartmann, NP Follow up in 1 week(s).   Specialty: Vascular Surgery Why: First post-op incision check. No studies.  Contact information: [place] [address] [phone]          Signed: Sela Hua, PA-C  01/23/2020, 11:37 AM

## 2020-01-23 NOTE — Anesthesia Postprocedure Evaluation (Signed)
Anesthesia Post Note  Patient: Nicholas Torres  Procedure(s) Performed: ENDARTERECTOMY CAROTID (Right )  Patient location during evaluation: SICU Anesthesia Type: General Level of consciousness: lethargic Pain management: satisfactory to patient Vital Signs Assessment: post-procedure vital signs reviewed and stable Respiratory status: spontaneous breathing and patient connected to nasal cannula oxygen Cardiovascular status: stable Postop Assessment: no apparent nausea or vomiting Anesthetic complications: no     Last Vitals:  Vitals:   01/23/20 0607 01/23/20 0630  BP: (!) 87/60 (!) 88/59  Pulse: (!) 51 (!) 50  Resp:    Temp:    SpO2: 99% 100%    Last Pain:  Vitals:   01/23/20 0400  TempSrc: Oral  PainSc:                  Lia Foyer

## 2020-01-26 LAB — SURGICAL PATHOLOGY

## 2020-01-29 ENCOUNTER — Ambulatory Visit: Payer: BC Managed Care – PPO | Attending: Family

## 2020-01-29 DIAGNOSIS — Z23 Encounter for immunization: Secondary | ICD-10-CM

## 2020-01-29 NOTE — Progress Notes (Addendum)
   Covid-19 Vaccination Clinic  Name:  Kemar Ciliberti    MRN: [id number] DOB: [birth year]  01/29/2020  Mr. Bomba was observed post Covid-19 immunization for 30 minutes based on pre-vaccination screening without incident. He was provided with Vaccine Information Sheet and instruction to access the V-Safe system.   Mr. Pirl was instructed to call 911 with any severe reactions post vaccine: Marland Kitchen Difficulty breathing  . Swelling of face and throat  . A fast heartbeat  . A bad rash all over body  . Dizziness and weakness   Immunizations Administered    Name Date Dose VIS Date Route   Moderna COVID-19 Vaccine 01/29/2020  1:09 PM 0.5 mL 10/14/2019 Intramuscular   Manufacturer: Moderna   Lot: [id number]   [id number]

## 2020-01-30 ENCOUNTER — Other Ambulatory Visit: Payer: Self-pay

## 2020-01-30 ENCOUNTER — Ambulatory Visit (INDEPENDENT_AMBULATORY_CARE_PROVIDER_SITE_OTHER): Payer: BC Managed Care – PPO | Admitting: Nurse Practitioner

## 2020-01-30 ENCOUNTER — Encounter (INDEPENDENT_AMBULATORY_CARE_PROVIDER_SITE_OTHER): Payer: Self-pay | Admitting: Nurse Practitioner

## 2020-01-30 VITALS — BP 102/72 | HR 93 | Resp 16 | Wt 178.0 lb

## 2020-01-30 DIAGNOSIS — I6521 Occlusion and stenosis of right carotid artery: Secondary | ICD-10-CM

## 2020-01-30 DIAGNOSIS — I10 Essential (primary) hypertension: Secondary | ICD-10-CM

## 2020-02-04 ENCOUNTER — Encounter (INDEPENDENT_AMBULATORY_CARE_PROVIDER_SITE_OTHER): Payer: Self-pay | Admitting: Nurse Practitioner

## 2020-02-04 NOTE — Progress Notes (Signed)
SUBJECTIVE:  Patient ID: Nicholas Torres, male    DOB: Sep 25, 1961, 59 y.o.   MRN: [id number] Chief Complaint  Patient presents with  . Follow-up    [facility] 1week  post endarterectomy    HPI  Nicholas Torres is a 59 y.o. male that presents today after right carotid endarterectomy on 01/22/2020.  Overall the patient has been doing well following the surgery.  Minimal pain.  He denies any signs symptoms of infection such as fever, chills, nausea, vomiting or diarrhea.  He still has some swelling as well as some numbness but but essentially no major issues.  He denies any TIA or strokelike symptoms.  He denies any amaurosis fugax.  Past Medical History:  Diagnosis Date  . CAD (coronary artery disease) 2017  . CHF (congestive heart failure) ([place])   . High cholesterol   . Hypertension   . Myocardial infarction ([place]) 2017  . Pollen allergy     Past Surgical History:  Procedure Laterality Date  . CARDIAC CATHETERIZATION Right 07/24/2016   Procedure: Left Heart Cath and Coronary Angiography;  Surgeon: Dionisio David, MD;  Location: [place] [facility];  Service: Cardiovascular;  Laterality: Right;  . CORONARY ARTERY BYPASS GRAFT N/A 07/26/2016   Procedure: CORONARY ARTERY BYPASS GRAFTING (CABG), ON PUMP, TIMES FOUR, USING BILATERAL INTERNAL MAMMARY ARTERIES, RIGHT GREATER SAPHENOUS VEIN HARVESTED ENDOSCOPICALLY;  Surgeon: Melrose Nakayama, MD;  Location: [place];  Service: Open Heart Surgery;  Laterality: N/A;  . ENDARTERECTOMY Right 01/22/2020   Procedure: ENDARTERECTOMY CAROTID;  Surgeon: Algernon Huxley, MD;  Location: [facility];  Service: Vascular;  Laterality: Right;  . RADIAL ARTERY HARVEST Left 07/26/2016   Procedure: RADIAL LEFT ARTERY HARVEST;  Surgeon: Melrose Nakayama, MD;  Location: [place];  Service: Open Heart Surgery;  Laterality: Left;  . TEE WITHOUT CARDIOVERSION N/A 07/26/2016   Procedure: TRANSESOPHAGEAL ECHOCARDIOGRAM (TEE);  Surgeon: Melrose Nakayama, MD;  Location: [place];   Service: Open Heart Surgery;  Laterality: N/A;    Social History   Socioeconomic History  . Marital status: Married    Spouse name: Not on file  . Number of children: Not on file  . Years of education: Not on file  . Highest education level: Not on file  Occupational History  . Not on file  Tobacco Use  . Smoking status: Never Smoker  . Smokeless tobacco: Never Used  Substance and Sexual Activity  . Alcohol use: No  . Drug use: No  . Sexual activity: Yes  Other Topics Concern  . Not on file  Social History Narrative  . Not on file   Social Determinants of Health   Financial Resource Strain:   . Difficulty of Paying Living Expenses:   Food Insecurity:   . Worried About Charity fundraiser in the Last Year:   . Arboriculturist in the Last Year:   Transportation Needs:   . Film/video editor (Medical):   Marland Kitchen Lack of Transportation (Non-Medical):   Physical Activity:   . Days of Exercise per Week:   . Minutes of Exercise per Session:   Stress:   . Feeling of Stress :   Social Connections:   . Frequency of Communication with Friends and Family:   . Frequency of Social Gatherings with Friends and Family:   . Attends Religious Services:   . Active Member of Clubs or Organizations:   . Attends Archivist Meetings:   Marland Kitchen Marital Status:   Intimate  Partner Violence:   . Fear of Current or Ex-Partner:   . Emotionally Abused:   Marland Kitchen Physically Abused:   . Sexually Abused:     Family History  Problem Relation Age of Onset  . Diabetes Mother   . Heart attack Father   . CAD Brother     Allergies  Allergen Reactions  . No Known Allergies      Review of Systems   Review of Systems: Negative Unless Checked Constitutional: [] Weight loss  [] Fever  [] Chills Cardiac: [] Chest pain   []  Atrial Fibrillation  [] Palpitations   [] Shortness of breath when laying flat   [] Shortness of breath with exertion. [] Shortness of breath at rest Vascular:  [] Pain in legs with  walking   [] Pain in legs with standing [] Pain in legs when laying flat   [] Claudication    [] Pain in feet when laying flat    [] History of DVT   [] Phlebitis   [] Swelling in legs   [] Varicose veins   [] Non-healing ulcers Pulmonary:   [] Uses home oxygen   [] Productive cough   [] Hemoptysis   [] Wheeze  [] COPD   [] Asthma Neurologic:  [] Dizziness   [] Seizures  [] Blackouts [] History of stroke   [] History of TIA  [] Aphasia   [] Temporary Blindness   [] Weakness or numbness in arm   [] Weakness or numbness in leg Musculoskeletal:   [] Joint swelling   [] Joint pain   [] Low back pain  []  History of Knee Replacement [] Arthritis [] back Surgeries  []  Spinal Stenosis    Hematologic:  [] Easy bruising  [] Easy bleeding   [] Hypercoagulable state   [] Anemic Gastrointestinal:  [x] Diarrhea   [] Vomiting  [] Gastroesophageal reflux/heartburn   [] Difficulty swallowing. [] Abdominal pain Genitourinary:  [] Chronic kidney disease   [] Difficult urination  [] Anuric   [] Blood in urine [] Frequent urination  [] Burning with urination   [] Hematuria Skin:  [] Rashes   [] Ulcers [x] Wounds Psychological:  [] History of anxiety   []  History of major depression  []  Memory Difficulties      OBJECTIVE:   Physical Exam  BP 102/72 (BP Location: Right Arm)   Pulse 93   Resp 16   Wt 178 lb (80.7 kg)   BMI 29.62 kg/m   Gen: WD/WN, NAD Head: [place]/AT, No temporalis wasting.  Ear/Nose/Throat: Hearing grossly intact, nares w/o erythema or drainage Eyes: PER, EOMI, sclera nonicteric.  Neck: Supple, no masses.  No JVD.  Pulmonary:  Good air movement, no use of accessory muscles.  Cardiac: RRR Vascular:  Right carotid endarterectomy incision clean dry and intact.  Dermabond still in place.  Some swelling at right neck Vessel Right Left  Radial Palpable Palpable   Gastrointestinal: soft, non-distended. No guarding/no peritoneal signs.  Musculoskeletal: M/S 5/5 throughout.  No deformity or atrophy.  Neurologic: Pain and light touch intact in  extremities.  Symmetrical.  Speech is fluent. Motor exam as listed above. Psychiatric: Judgment intact, Mood & affect appropriate for pt's clinical situation.        ASSESSMENT AND PLAN:  1. Stenosis of right carotid artery Overall patient is doing well post carotid endarterectomy.  Patient advised that it would probably be another 1 to 2 weeks before he would be able to drive.  Advised to contact office if the incision begins to drain or become suddenly painful.  Otherwise we will have the patient return in 6 to 8 weeks for a carotid duplex.  2. Essential hypertension Continue antihypertensive medications as already ordered, these medications have been reviewed and there are no changes at this time.  Current Outpatient Medications on File Prior to Visit  Medication Sig Dispense Refill  . Alirocumab (PRALUENT) 150 MG/ML SOAJ Inject into the skin every 30 (thirty) days.    Marland Kitchen aspirin EC 325 MG EC tablet Take 1 tablet (325 mg total) by mouth daily. 30 tablet 0  . clopidogrel (PLAVIX) 75 MG tablet Take 1 tablet (75 mg total) by mouth daily at 6 (six) AM. 90 tablet 0  . cyclobenzaprine (FLEXERIL) 10 MG tablet Take 10 mg by mouth at bedtime.    . Evolocumab 140 MG/ML SOAJ Inject into the skin.    . fluticasone (FLONASE) 50 MCG/ACT nasal spray Place 1 spray into both nostrils daily. 16 g 3  . gabapentin (NEURONTIN) 300 MG capsule Take 300 mg by mouth 3 (three) times daily.    Marland Kitchen ibuprofen (ADVIL) 600 MG tablet Take 1 tablet (600 mg total) by mouth every 6 (six) hours as needed. 30 tablet 0  . lisinopril-hydrochlorothiazide (PRINZIDE,ZESTORETIC) 10-12.5 MG tablet Take 1 tablet by mouth daily. 90 tablet 0  . metoprolol tartrate (LOPRESSOR) 25 MG tablet Take 0.5 tablets (12.5 mg total) by mouth daily. 90 tablet 2  . rosuvastatin (CRESTOR) 10 MG tablet Take 10 mg by mouth daily.    Marland Kitchen oxyCODONE-acetaminophen (PERCOCET/ROXICET) 5-325 MG tablet Take 1 tablet by mouth every 6 (six) hours as needed for  moderate pain or severe pain. (Patient not taking: Reported on 01/30/2020) 28 tablet 0   No current facility-administered medications on file prior to visit.    There are no Patient Instructions on file for this visit. No follow-ups on file.   Kris Hartmann, NP  This note was completed with Sales executive.  Any errors are purely unintentional.

## 2020-02-24 ENCOUNTER — Encounter (INDEPENDENT_AMBULATORY_CARE_PROVIDER_SITE_OTHER): Payer: Self-pay

## 2020-02-25 ENCOUNTER — Encounter (INDEPENDENT_AMBULATORY_CARE_PROVIDER_SITE_OTHER): Payer: Self-pay

## 2020-03-02 ENCOUNTER — Ambulatory Visit: Payer: BC Managed Care – PPO | Attending: Family

## 2020-03-02 DIAGNOSIS — Z23 Encounter for immunization: Secondary | ICD-10-CM

## 2020-03-02 NOTE — Progress Notes (Signed)
   Covid-19 Vaccination Clinic  Name:  Yochanan Dempewolf    MRN: [id number] DOB: [birth year]  03/02/2020  Mr. Bezdek was observed post Covid-19 immunization for 15 minutes without incident. He was provided with Vaccine Information Sheet and instruction to access the V-Safe system.   Mr. Hornecker was instructed to call 911 with any severe reactions post vaccine: Marland Kitchen Difficulty breathing  . Swelling of face and throat  . A fast heartbeat  . A bad rash all over body  . Dizziness and weakness   Immunizations Administered    Name Date Dose VIS Date Route   Moderna COVID-19 Vaccine 03/02/2020 10:56 AM 0.5 mL 10/2019 Intramuscular   Manufacturer: Moderna   Lot: [id number]   [id number]

## 2020-03-08 ENCOUNTER — Other Ambulatory Visit (INDEPENDENT_AMBULATORY_CARE_PROVIDER_SITE_OTHER): Payer: Self-pay | Admitting: Nurse Practitioner

## 2020-03-08 DIAGNOSIS — Z9889 Other specified postprocedural states: Secondary | ICD-10-CM

## 2020-03-08 DIAGNOSIS — I6521 Occlusion and stenosis of right carotid artery: Secondary | ICD-10-CM

## 2020-03-09 ENCOUNTER — Other Ambulatory Visit: Payer: Self-pay

## 2020-03-09 ENCOUNTER — Encounter (INDEPENDENT_AMBULATORY_CARE_PROVIDER_SITE_OTHER): Payer: Self-pay | Admitting: Vascular Surgery

## 2020-03-09 ENCOUNTER — Ambulatory Visit (INDEPENDENT_AMBULATORY_CARE_PROVIDER_SITE_OTHER): Payer: BC Managed Care – PPO | Admitting: Vascular Surgery

## 2020-03-09 ENCOUNTER — Ambulatory Visit (INDEPENDENT_AMBULATORY_CARE_PROVIDER_SITE_OTHER): Payer: BC Managed Care – PPO

## 2020-03-09 VITALS — BP 101/66 | HR 52 | Resp 16 | Wt 178.0 lb

## 2020-03-09 DIAGNOSIS — I6521 Occlusion and stenosis of right carotid artery: Secondary | ICD-10-CM

## 2020-03-09 DIAGNOSIS — I10 Essential (primary) hypertension: Secondary | ICD-10-CM

## 2020-03-09 DIAGNOSIS — I2510 Atherosclerotic heart disease of native coronary artery without angina pectoris: Secondary | ICD-10-CM

## 2020-03-09 DIAGNOSIS — Z9889 Other specified postprocedural states: Secondary | ICD-10-CM

## 2020-03-09 NOTE — Assessment & Plan Note (Signed)
Duplex today shows a widely patent right carotid endarterectomy.  There is no significant left ICA stenosis.  Patient is doing well.  His wound has healed.  He has resumed all general activities at this point.  I will plan to see him back in 6 months with carotid duplex and if that looks good we can go to a once a year visit.  I am okay if he stops the Plavix after 3 months from surgery

## 2020-03-09 NOTE — Assessment & Plan Note (Signed)
S/p CABG 

## 2020-03-09 NOTE — Assessment & Plan Note (Signed)
blood pressure control important in reducing the progression of atherosclerotic disease. On appropriate oral medications.  

## 2020-03-09 NOTE — Progress Notes (Signed)
Patient ID: Nicholas Torres, male   DOB: 11/26/1960, 59 y.o.   MRN: [id number]  Chief Complaint  Patient presents with  . Follow-up    6-8 week carotid ultrasound     HPI Nicholas Torres is a 59 y.o. male.  Patient is doing well about 7 weeks status post right carotid endarterectomy for high-grade stenosis.  He has no specific complaints today other than he is cold most of the time and he thinks his voice is not as deep as it was.  His incision is well-healed.  No significant pain or fatigue at this point. Duplex today shows a widely patent right carotid endarterectomy.  There is no significant left ICA stenosis.   Past Medical History:  Diagnosis Date  . CAD (coronary artery disease) 2017  . CHF (congestive heart failure) ([place])   . High cholesterol   . Hypertension   . Myocardial infarction ([place]) 2017  . Pollen allergy     Past Surgical History:  Procedure Laterality Date  . CARDIAC CATHETERIZATION Right 07/24/2016   Procedure: Left Heart Cath and Coronary Angiography;  Surgeon: Dionisio David, MD;  Location: [facility];  Service: Cardiovascular;  Laterality: Right;  . CORONARY ARTERY BYPASS GRAFT N/A 07/26/2016   Procedure: CORONARY ARTERY BYPASS GRAFTING (CABG), ON PUMP, TIMES FOUR, USING BILATERAL INTERNAL MAMMARY ARTERIES, RIGHT GREATER SAPHENOUS VEIN HARVESTED ENDOSCOPICALLY;  Surgeon: Melrose Nakayama, MD;  Location: [place];  Service: Open Heart Surgery;  Laterality: N/A;  . ENDARTERECTOMY Right 01/22/2020   Procedure: ENDARTERECTOMY CAROTID;  Surgeon: Algernon Huxley, MD;  Location: [facility];  Service: Vascular;  Laterality: Right;  . RADIAL ARTERY HARVEST Left 07/26/2016   Procedure: RADIAL LEFT ARTERY HARVEST;  Surgeon: Melrose Nakayama, MD;  Location: [place];  Service: Open Heart Surgery;  Laterality: Left;  . TEE WITHOUT CARDIOVERSION N/A 07/26/2016   Procedure: TRANSESOPHAGEAL ECHOCARDIOGRAM (TEE);  Surgeon: Melrose Nakayama, MD;  Location: [place];  Service:  Open Heart Surgery;  Laterality: N/A;      Allergies  Allergen Reactions  . No Known Allergies     Current Outpatient Medications  Medication Sig Dispense Refill  . Alirocumab (PRALUENT) 150 MG/ML SOAJ Inject into the skin every 30 (thirty) days.    Marland Kitchen aspirin EC 325 MG EC tablet Take 1 tablet (325 mg total) by mouth daily. 30 tablet 0  . clopidogrel (PLAVIX) 75 MG tablet Take 1 tablet (75 mg total) by mouth daily at 6 (six) AM. 90 tablet 0  . cyclobenzaprine (FLEXERIL) 10 MG tablet Take 10 mg by mouth at bedtime.    . Evolocumab 140 MG/ML SOAJ Inject into the skin.    . fluticasone (FLONASE) 50 MCG/ACT nasal spray Place 1 spray into both nostrils daily. 16 g 3  . gabapentin (NEURONTIN) 300 MG capsule Take 300 mg by mouth 3 (three) times daily.    Marland Kitchen ibuprofen (ADVIL) 600 MG tablet Take 1 tablet (600 mg total) by mouth every 6 (six) hours as needed. 30 tablet 0  . lisinopril-hydrochlorothiazide (PRINZIDE,ZESTORETIC) 10-12.5 MG tablet Take 1 tablet by mouth daily. 90 tablet 0  . metoprolol tartrate (LOPRESSOR) 25 MG tablet Take 0.5 tablets (12.5 mg total) by mouth daily. 90 tablet 2  . rosuvastatin (CRESTOR) 10 MG tablet Take 10 mg by mouth daily.    Marland Kitchen oxyCODONE-acetaminophen (PERCOCET/ROXICET) 5-325 MG tablet Take 1 tablet by mouth every 6 (six) hours as needed for moderate pain or severe pain. (Patient not taking: Reported on  01/30/2020) 28 tablet 0   No current facility-administered medications for this visit.        Physical Exam BP 101/66 (BP Location: Right Arm)   Pulse (!) 52   Resp 16   Wt 178 lb (80.7 kg)   BMI 29.62 kg/m  Gen:  WD/WN, NAD Skin: incision C/D/I Neuro: Symmetric with no focal neurologic deficits     Assessment/Plan:  CAD (coronary artery disease), native coronary artery S/p CABG  Essential hypertension blood pressure control important in reducing the progression of atherosclerotic disease. On appropriate oral medications.   Carotid stenosis,  right Duplex today shows a widely patent right carotid endarterectomy.  There is no significant left ICA stenosis.  Patient is doing well.  His wound has healed.  He has resumed all general activities at this point.  I will plan to see him back in 6 months with carotid duplex and if that looks good we can go to a once a year visit.  I am okay if he stops the Plavix after 3 months from surgery      Leotis Pain 03/09/2020, 12:08 PM   This note was created with Dragon medical transcription system.  Any errors from dictation are unintentional.

## 2020-03-25 ENCOUNTER — Telehealth: Payer: Self-pay | Admitting: Emergency Medicine

## 2020-03-25 ENCOUNTER — Telehealth: Payer: Self-pay | Admitting: General Surgery

## 2020-03-25 NOTE — Telephone Encounter (Signed)
Patient came by the office and was wanting to schedule for surgery, I spoke with Dr. Celine Ahr asking if the patient needs to be seen, Per Dr. Celine Ahr patient did not need to be seen, but need to have cardiology clearance for the surgery. I have spoken with the nurses and they will be sending over the clearance and the patient will be contacted about surgery.

## 2020-03-25 NOTE — Telephone Encounter (Signed)
Cardiac Clearance received from Dr Humphrey Rolls. Pt is optimized for surgery at a low Risk. Patient is to hold aspirin 7 days prior to surgery.

## 2020-03-25 NOTE — Telephone Encounter (Signed)
See other note in chart. Cardiac Clearance form faxed. We did obtain Cardiac Clearance. Surgery sheet filled out by Dr Celine Ahr. Surgery scheduler will contact patient with surgery information is ready to be provided.

## 2020-03-25 NOTE — Telephone Encounter (Signed)
Cardiac clearance form faxed to Dr Neoma Laming at this time.  [phone]

## 2020-04-01 ENCOUNTER — Other Ambulatory Visit: Payer: Self-pay

## 2020-04-01 ENCOUNTER — Ambulatory Visit (INDEPENDENT_AMBULATORY_CARE_PROVIDER_SITE_OTHER): Payer: BC Managed Care – PPO | Admitting: General Surgery

## 2020-04-01 ENCOUNTER — Telehealth: Payer: Self-pay | Admitting: General Surgery

## 2020-04-01 ENCOUNTER — Encounter: Payer: Self-pay | Admitting: General Surgery

## 2020-04-01 VITALS — BP 125/78 | HR 64 | Temp 97.7°F | Resp 12 | Ht 65.0 in | Wt 179.6 lb

## 2020-04-01 DIAGNOSIS — K4090 Unilateral inguinal hernia, without obstruction or gangrene, not specified as recurrent: Secondary | ICD-10-CM
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
## 2020-04-01 NOTE — Patient Instructions (Addendum)
Surgery is scheduled for 04/16/20.  You are to hold Aspirin 7 days prior to surgery date. Last day to take aspirin is going to be on 04/08/20.   Please relate to the blue sheet that was given to you to see your Surgery Information.  Call the office if you have any questions or concerns.   Open Hernia Repair, Adult  Open hernia repair is a surgical procedure to fix a hernia. A hernia occurs when an internal organ or tissue pushes out through a weak spot in the abdominal wall muscles. Hernias commonly occur in the groin and around the navel. Most hernias tend to get worse over time. Often, surgery is done to prevent the hernia from becoming bigger, uncomfortable, or an emergency. Emergency surgery may be needed if abdominal contents get stuck in the opening (incarcerated hernia) or the blood supply gets cut off (strangulated hernia). In an open repair, an incision is made in the abdomen to perform the surgery. Tell a health care provider about:  Any allergies you have.  All medicines you are taking, including vitamins, herbs, eye drops, creams, and over-the-counter medicines.  Any problems you or family members have had with anesthetic medicines.  Any blood or bone disorders you have.  Any surgeries you have had.  Any medical conditions you have, including any recent cold or flu symptoms.  Whether you are pregnant or may be pregnant. What are the risks? Generally, this is a safe procedure. However, problems may occur, including:  Long-lasting (chronic) pain.  Bleeding.  Infection.  Damage to the testicle. This can cause shrinking or swelling.  Damage to the bladder, blood vessels, intestine, or nerves near the hernia.  Trouble passing urine.  Allergic reactions to medicines.  Return of the hernia. What happens before the procedure? Staying hydrated Follow instructions from your health care provider about hydration, which may include:  Up to 2 hours before the procedure -  you may continue to drink clear liquids, such as water, clear fruit juice, black coffee, and plain tea. Eating and drinking restrictions Follow instructions from your health care provider about eating and drinking, which may include:  8 hours before the procedure - stop eating heavy meals or foods such as meat, fried foods, or fatty foods.  6 hours before the procedure - stop eating light meals or foods, such as toast or cereal.  6 hours before the procedure - stop drinking milk or drinks that contain milk.  2 hours before the procedure - stop drinking clear liquids. Medicines  Ask your health care provider about: ? Changing or stopping your regular medicines. This is especially important if you are taking diabetes medicines or blood thinners. ? Taking medicines such as aspirin and ibuprofen. These medicines can thin your blood. Do not take these medicines before your procedure if your health care provider instructs you not to.  You may be given antibiotic medicine to help prevent infection. General instructions  You may have blood tests or imaging studies.  Ask your health care provider how your surgical site will be marked or identified.  If you smoke, do not smoke for at least 2 weeks before your procedure or for as long as told by your health care provider.  Let your health care provider know if you develop a cold or any infection before your surgery.  Plan to have someone take you home from the hospital or clinic.  If you will be going home right after the procedure, plan to have someone  with you for 24 hours. What happens during the procedure?  To reduce your risk of infection: ? Your health care team will wash or sanitize their hands. ? Your skin will be washed with soap. ? Hair may be removed from the surgical area.  An IV tube will be inserted into one of your veins.  You will be given one or more of the following: ? A medicine to help you relax (sedative). ? A  medicine to numb the area (local anesthetic). ? A medicine to make you fall asleep (general anesthetic).  Your surgeon will make an incision over the hernia.  The tissues of the hernia will be moved back into place.  The edges of the hernia may be stitched together.  The opening in the abdominal muscles will be closed with stitches (sutures). Or, your surgeon will place a mesh patch made of manmade (synthetic) material over the opening.  The incision will be closed.  A bandage (dressing) may be placed over the incision. The procedure may vary among health care providers and hospitals. What happens after the procedure?  Your blood pressure, heart rate, breathing rate, and blood oxygen level will be monitored until the medicines you were given have worn off.  You may be given medicine for pain.  Do not drive for 24 hours if you received a sedative. This information is not intended to replace advice given to you by your health care provider. Make sure you discuss any questions you have with your health care provider. Document Revised: 10/12/2017 Document Reviewed: 04/12/2016 Elsevier Patient Education  2020 [employer].

## 2020-04-01 NOTE — Telephone Encounter (Signed)
Nicholas Torres with [facility] spoke with patient today during his follow up with Dr. Celine Ahr and while in the office was advised of Pre-Admission date/time, COVID Testing date and Surgery date.  Surgery Date: 04/16/20 Preadmission Testing Date: 04/06/20 (phone 8a-1p) Covid Testing Date: 04/14/20 - patient advised to go to the [facility] between 8a-1p   Patient has been made aware to call [phone], between 1-3:00pm the day before surgery, to find out what time to arrive for surgery.
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
## 2020-04-06 ENCOUNTER — Encounter
Admission: RE | Admit: 2020-04-06 | Discharge: 2020-04-06 | Disposition: A | Discharge status: Home / Self Care | Payer: BC Managed Care – PPO | Source: Ambulatory Visit | Attending: General Surgery | Admitting: General Surgery

## 2020-04-06 ENCOUNTER — Other Ambulatory Visit: Payer: Self-pay | Admitting: General Surgery

## 2020-04-06 ENCOUNTER — Other Ambulatory Visit: Payer: Self-pay

## 2020-04-06 ENCOUNTER — Telehealth: Payer: Self-pay | Admitting: General Surgery

## 2020-04-06 DIAGNOSIS — Z01818 Encounter for other preprocedural examination: Secondary | ICD-10-CM | POA: Diagnosis present

## 2020-04-06 DIAGNOSIS — K4090 Unilateral inguinal hernia, without obstruction or gangrene, not specified as recurrent: Secondary | ICD-10-CM

## 2020-04-06 HISTORY — DX: Unspecified osteoarthritis, unspecified site: M19.90

## 2020-04-06 NOTE — Patient Instructions (Addendum)
Your procedure is scheduled on: 04-16-20 FRIDAY Report to Same Day Surgery 2nd floor medical mall [facility] Entrance-take elevator on left to 2nd floor.  Check in with surgery information desk.) To find out your arrival time please call [phone] between 1PM - 3PM on 04-15-20 THURSDAY  Remember: Instructions that are not followed completely may result in serious medical risk, up to and including death, or upon the discretion of your surgeon and anesthesiologist your surgery may need to be rescheduled.    _x___ 1. Do not eat food after midnight the night before your procedure. NO GUM OR CANDY AFTER MIDNIGHT. You may drink clear liquids up to 2 hours before you are scheduled to arrive at the hospital for your procedure.  Do not drink clear liquids within 2 hours of your scheduled arrival to the hospital.  Clear liquids include  --Water or Apple juice without pulp  --Gatorade  --Black Coffee or Clear Tea (No milk, no creamers, do not add anything to the coffee or Tea-ok to add sugar)   ____Ensure clear carbohydrate drink on the way to the hospital for bariatric patients  ____Ensure clear carbohydrate drink 3 hours before surgery.     __x__ 2. No Alcohol for 24 hours before or after surgery.   __x__3. No Smoking or e-cigarettes for 24 prior to surgery.  Do not use any chewable tobacco products for at least 6 hour prior to surgery   ____  4. Bring all medications with you on the day of surgery if instructed.    __x__ 5. Notify your doctor if there is any change in your medical condition     (cold, fever, infections).    x___6. On the morning of surgery brush your teeth with toothpaste and water.  You may rinse your mouth with mouth wash if you wish.  Do not swallow any toothpaste or mouthwash.   Do not wear jewelry, make-up, hairpins, clips or nail polish.  Do not wear lotions, powders, or perfumes.  Do not shave 48 hours prior to surgery. Men may shave face and neck.  Do not bring  valuables to the hospital.    [facility] is not responsible for any belongings or valuables.               Contacts, dentures or bridgework may not be worn into surgery.  Leave your suitcase in the car. After surgery it may be brought to your room.  For patients admitted to the hospital, discharge time is determined by your  treatment team.  _  Patients discharged the day of surgery will not be allowed to drive home.  You will need someone to drive you home and stay with you the night of your procedure.    Please read over the following fact sheets that you were given:   [facility] Preparing for Surgery   _x___ TAKE THE FOLLOWING MEDICATION THE MORNING OF SURGERY WITH A SMALL SIP OF WATER. These include:  1. METOPROLOL (TOPROL)  2. CRESTOR (ROSUVASTATIN)  3. GABAPENTIN (NEURONTIN)  4.  5.  6.  ____Fleets enema or Magnesium Citrate as directed.   _x___ Use CHG Soap or sage wipes as directed on instruction sheet   ____ Use inhalers on the day of surgery and bring to hospital day of surgery  ____ Stop Metformin and Janumet 2 days prior to surgery.    ____ Take 1/2 of usual insulin dose the night before surgery and none on the morning surgery.   _x___  Follow recommendations from Cardiologist, Pulmonologist or PCP regarding stopping Aspirin, Coumadin, Plavix ,Eliquis, Effient, or Pradaxa, and Pletal-INSTRUCTED TO STOP ASPIRIN 7 DAYS PRIOR TO SURGERY (5-27) BY DR Celine Ahr.CALL DR CANNON'S OFFICE REGARDING WHEN TO STOP YOUR PLAVIX (CLOPIDOGREL)  X____Stop Anti-inflammatories such as Advil, Aleve, Ibuprofen, Motrin, Naproxen, Naprosyn, Goodies powders or aspirin products 7 DAYS PRIOR TO SURGERY-OK to take Tylenol   ____ Stop supplements until after surgery.     ____ Bring C-Pap to the hospital.

## 2020-04-06 NOTE — Telephone Encounter (Signed)
Per Dr Celine Ahr patient notified to stop Both Plavix and aspirin for 7 days prior to surgery.

## 2020-04-06 NOTE — Telephone Encounter (Signed)
Patient calls this morning.  He had his pre-admission screening done.  He knows to stop his aspirin 7 days prior to surgery but when the nurse in pre-admit was doing his screening, she asked him to call us and also check to see if he needs to stop his Plavix as well.  Please call patient back.  Thank you.

## 2020-04-13 NOTE — Pre-Procedure Instructions (Signed)
Nicholas Huxley, MD  Physician  Specialty:  Vascular Surgery  Assessment & Plan Note  Written  Encounter Date:  03/09/2020          Related Problems  Carotid stenosis, right      Written         Show:Clear all [x] Manual[] Template[] Copied  Added by: [x] Nicholas Huxley, MD  [] Hover for details Duplex today shows a widely patent right carotid endarterectomy.  There is no significant left ICA stenosis.  Patient is doing well.  His wound has healed.  He has resumed all general activities at this point.  I will plan to see him back in 6 months with carotid duplex and if that looks good we can go to a once a year visit.  I am okay if he stops the Plavix after 3 months from surgery        Electronically signed by Nicholas Huxley, MD at 4/27

## 2020-04-14 ENCOUNTER — Other Ambulatory Visit
Admission: RE | Admit: 2020-04-14 | Discharge: 2020-04-14 | Disposition: A | Discharge status: Home / Self Care | Payer: BC Managed Care – PPO | Source: Ambulatory Visit | Attending: General Surgery | Admitting: General Surgery

## 2020-04-14 ENCOUNTER — Other Ambulatory Visit: Payer: Self-pay

## 2020-04-14 DIAGNOSIS — Z01812 Encounter for preprocedural laboratory examination: Secondary | ICD-10-CM | POA: Diagnosis present

## 2020-04-14 DIAGNOSIS — Z20822 Contact with and (suspected) exposure to covid-19: Secondary | ICD-10-CM | POA: Insufficient documentation

## 2020-04-14 LAB — SARS CORONAVIRUS 2 (TAT 6-24 HRS): SARS Coronavirus 2: NEGATIVE

## 2020-04-14 LAB — CBC
HCT: 40 % (ref 39.0–52.0)
Hemoglobin: 13.4 g/dL (ref 13.0–17.0)
MCH: 30.7 pg (ref 26.0–34.0)
MCHC: 33.5 g/dL (ref 30.0–36.0)
MCV: 91.5 fL (ref 80.0–100.0)
Platelets: 148 10*3/uL — ABNORMAL LOW (ref 150–400)
RBC: 4.37 MIL/uL (ref 4.22–5.81)
RDW: 13.2 % (ref 11.5–15.5)
WBC: 4.8 10*3/uL (ref 4.0–10.5)
nRBC: 0 % (ref 0.0–0.2)

## 2020-04-14 LAB — POTASSIUM: Potassium: 3.1 mmol/L — ABNORMAL LOW (ref 3.5–5.1)

## 2020-04-15 ENCOUNTER — Other Ambulatory Visit (INDEPENDENT_AMBULATORY_CARE_PROVIDER_SITE_OTHER): Payer: Self-pay | Admitting: Vascular Surgery

## 2020-04-16 ENCOUNTER — Ambulatory Visit
Admission: RE | Admit: 2020-04-16 | Discharge: 2020-04-16 | Disposition: A | Discharge status: Home / Self Care | Payer: No Typology Code available for payment source | Source: Ambulatory Visit | Attending: General Surgery | Admitting: General Surgery

## 2020-04-16 ENCOUNTER — Encounter: Payer: Self-pay | Admitting: General Surgery

## 2020-04-16 ENCOUNTER — Ambulatory Visit: Payer: No Typology Code available for payment source | Admitting: Certified Registered Nurse Anesthetist

## 2020-04-16 ENCOUNTER — Encounter
Admission: RE | Disposition: A | Discharge status: Home / Self Care | Payer: Self-pay | Source: Ambulatory Visit | Attending: General Surgery

## 2020-04-16 ENCOUNTER — Other Ambulatory Visit: Payer: Self-pay

## 2020-04-16 DIAGNOSIS — D176 Benign lipomatous neoplasm of spermatic cord: Secondary | ICD-10-CM | POA: Diagnosis not present

## 2020-04-16 DIAGNOSIS — Z7982 Long term (current) use of aspirin: Secondary | ICD-10-CM | POA: Insufficient documentation

## 2020-04-16 DIAGNOSIS — I110 Hypertensive heart disease with heart failure: Secondary | ICD-10-CM | POA: Diagnosis not present

## 2020-04-16 DIAGNOSIS — I509 Heart failure, unspecified: Secondary | ICD-10-CM | POA: Diagnosis not present

## 2020-04-16 DIAGNOSIS — Z7902 Long term (current) use of antithrombotics/antiplatelets: Secondary | ICD-10-CM | POA: Insufficient documentation

## 2020-04-16 DIAGNOSIS — I252 Old myocardial infarction: Secondary | ICD-10-CM | POA: Diagnosis not present

## 2020-04-16 DIAGNOSIS — K4090 Unilateral inguinal hernia, without obstruction or gangrene, not specified as recurrent: Secondary | ICD-10-CM

## 2020-04-16 DIAGNOSIS — Z951 Presence of aortocoronary bypass graft: Secondary | ICD-10-CM | POA: Insufficient documentation

## 2020-04-16 DIAGNOSIS — E7800 Pure hypercholesterolemia, unspecified: Secondary | ICD-10-CM | POA: Insufficient documentation

## 2020-04-16 DIAGNOSIS — I2510 Atherosclerotic heart disease of native coronary artery without angina pectoris: Secondary | ICD-10-CM | POA: Insufficient documentation

## 2020-04-16 DIAGNOSIS — Z79899 Other long term (current) drug therapy: Secondary | ICD-10-CM | POA: Insufficient documentation

## 2020-04-16 HISTORY — PX: INGUINAL HERNIA REPAIR: SHX194

## 2020-04-16 SURGERY — REPAIR, HERNIA, INGUINAL, ADULT
Anesthesia: General | Site: Abdomen | Laterality: Right

## 2020-04-16 MED ORDER — MIDAZOLAM HCL 2 MG/2ML IJ SOLN
INTRAMUSCULAR | Status: DC | PRN
  Administered 2020-04-16: 2 mg via INTRAVENOUS

## 2020-04-16 MED ORDER — ROCURONIUM BROMIDE 100 MG/10ML IV SOLN
INTRAVENOUS | Status: DC | PRN
  Administered 2020-04-16: 50 mg via INTRAVENOUS
  Administered 2020-04-16 (×2): 10 mg via INTRAVENOUS

## 2020-04-16 MED ORDER — SODIUM CHLORIDE 0.9 % IV SOLN
INTRAVENOUS | Status: DC | PRN
  Administered 2020-04-16: 10 ug/min via INTRAVENOUS

## 2020-04-16 MED ORDER — DEXAMETHASONE SODIUM PHOSPHATE 10 MG/ML IJ SOLN
INTRAMUSCULAR | Status: DC | PRN
  Administered 2020-04-16: 10 mg via INTRAVENOUS

## 2020-04-16 MED ORDER — BUPIVACAINE LIPOSOME 1.3 % IJ SUSP: 20.0000 mL | Freq: Once | INTRAMUSCULAR | Status: DC

## 2020-04-16 MED ORDER — CEFAZOLIN SODIUM-DEXTROSE 2-4 GM/100ML-% IV SOLN
2.0000 g | INTRAVENOUS | Status: AC
  Administered 2020-04-16: 2 g via INTRAVENOUS

## 2020-04-16 MED ORDER — BUPIVACAINE LIPOSOME 1.3 % IJ SUSP
INTRAMUSCULAR | Status: DC | PRN
  Administered 2020-04-16: 20 mL

## 2020-04-16 MED ORDER — OXYCODONE HCL 5 MG PO TABS: 5.0000 mg | ORAL_TABLET | Freq: Once | ORAL | Status: DC | PRN

## 2020-04-16 MED ORDER — PHENYLEPHRINE HCL (PRESSORS) 10 MG/ML IV SOLN
INTRAVENOUS | Status: DC | PRN
  Administered 2020-04-16 (×2): 50 ug via INTRAVENOUS

## 2020-04-16 MED ORDER — CELECOXIB 200 MG PO CAPS
ORAL_CAPSULE | ORAL | Status: AC
  Administered 2020-04-16: 200 mg via ORAL
  Filled 2020-04-16: qty 1

## 2020-04-16 MED ORDER — CEFAZOLIN SODIUM-DEXTROSE 2-4 GM/100ML-% IV SOLN
INTRAVENOUS | Status: AC
  Filled 2020-04-16: qty 100

## 2020-04-16 MED ORDER — ONDANSETRON HCL 4 MG/2ML IJ SOLN: 4.0000 mg | Freq: Once | INTRAMUSCULAR | Status: DC | PRN

## 2020-04-16 MED ORDER — MIDAZOLAM HCL 2 MG/2ML IJ SOLN
INTRAMUSCULAR | Status: AC
  Filled 2020-04-16: qty 2

## 2020-04-16 MED ORDER — OXYCODONE HCL 5 MG/5ML PO SOLN: 5.0000 mg | Freq: Once | ORAL | Status: DC | PRN

## 2020-04-16 MED ORDER — CHLORHEXIDINE GLUCONATE 0.12 % MT SOLN: 15.0000 mL | Freq: Once | OROMUCOSAL | Status: AC

## 2020-04-16 MED ORDER — EPHEDRINE SULFATE 50 MG/ML IJ SOLN
INTRAMUSCULAR | Status: DC | PRN
  Administered 2020-04-16 (×6): 5 mg via INTRAVENOUS

## 2020-04-16 MED ORDER — LIDOCAINE HCL (CARDIAC) PF 100 MG/5ML IV SOSY
PREFILLED_SYRINGE | INTRAVENOUS | Status: DC | PRN
  Administered 2020-04-16: 100 mg via INTRAVENOUS

## 2020-04-16 MED ORDER — IBUPROFEN 800 MG PO TABS: 800.0000 mg | ORAL_TABLET | Freq: Three times a day (TID) | ORAL | 0 refills | Status: DC | PRN

## 2020-04-16 MED ORDER — CHLORHEXIDINE GLUCONATE CLOTH 2 % EX PADS
6.0000 | MEDICATED_PAD | Freq: Once | CUTANEOUS | Status: AC
  Administered 2020-04-16: 6 via TOPICAL

## 2020-04-16 MED ORDER — CHLORHEXIDINE GLUCONATE 0.12 % MT SOLN
OROMUCOSAL | Status: AC
  Administered 2020-04-16: 15 mL via OROMUCOSAL
  Filled 2020-04-16: qty 15

## 2020-04-16 MED ORDER — EPHEDRINE 5 MG/ML INJ
INTRAVENOUS | Status: AC
  Filled 2020-04-16: qty 10

## 2020-04-16 MED ORDER — FENTANYL CITRATE (PF) 100 MCG/2ML IJ SOLN
INTRAMUSCULAR | Status: AC
  Filled 2020-04-16: qty 2

## 2020-04-16 MED ORDER — ONDANSETRON HCL 4 MG/2ML IJ SOLN
INTRAMUSCULAR | Status: DC | PRN
  Administered 2020-04-16: 4 mg via INTRAVENOUS

## 2020-04-16 MED ORDER — LACTATED RINGERS IV SOLN
INTRAVENOUS | Status: DC
  Administered 2020-04-16: 50 mL/h via INTRAVENOUS

## 2020-04-16 MED ORDER — ACETAMINOPHEN 500 MG PO TABS: 1000.0000 mg | ORAL_TABLET | ORAL | Status: AC

## 2020-04-16 MED ORDER — FENTANYL CITRATE (PF) 100 MCG/2ML IJ SOLN
INTRAMUSCULAR | Status: DC | PRN
  Administered 2020-04-16 (×2): 25 ug via INTRAVENOUS
  Administered 2020-04-16: 50 ug via INTRAVENOUS

## 2020-04-16 MED ORDER — FENTANYL CITRATE (PF) 100 MCG/2ML IJ SOLN: 25.0000 ug | INTRAMUSCULAR | Status: DC | PRN

## 2020-04-16 MED ORDER — ORAL CARE MOUTH RINSE: 15.0000 mL | Freq: Once | OROMUCOSAL | Status: AC

## 2020-04-16 MED ORDER — LIDOCAINE-EPINEPHRINE 1 %-1:100000 IJ SOLN
INTRAMUSCULAR | Status: DC | PRN
  Administered 2020-04-16: 9 mL

## 2020-04-16 MED ORDER — HYDROCODONE-ACETAMINOPHEN 5-325 MG PO TABS
1.0000 | ORAL_TABLET | Freq: Four times a day (QID) | ORAL | 0 refills | Status: DC | PRN
Start: 2020-04-16 — End: 2020-09-03

## 2020-04-16 MED ORDER — LABETALOL HCL 5 MG/ML IV SOLN
INTRAVENOUS | Status: AC
  Filled 2020-04-16: qty 4

## 2020-04-16 MED ORDER — PROPOFOL 10 MG/ML IV BOLUS
INTRAVENOUS | Status: DC | PRN
  Administered 2020-04-16: 170 mg via INTRAVENOUS

## 2020-04-16 MED ORDER — SUGAMMADEX SODIUM 200 MG/2ML IV SOLN
INTRAVENOUS | Status: DC | PRN
  Administered 2020-04-16: 200 mg via INTRAVENOUS

## 2020-04-16 MED ORDER — PROPOFOL 10 MG/ML IV BOLUS
INTRAVENOUS | Status: AC
  Filled 2020-04-16: qty 20

## 2020-04-16 MED ORDER — CELECOXIB 200 MG PO CAPS: 200.0000 mg | ORAL_CAPSULE | ORAL | Status: AC

## 2020-04-16 MED ORDER — FAMOTIDINE 20 MG PO TABS: 20.0000 mg | ORAL_TABLET | Freq: Once | ORAL | Status: AC

## 2020-04-16 MED ORDER — LABETALOL HCL 5 MG/ML IV SOLN
10.0000 mg | Freq: Once | INTRAVENOUS | Status: AC
  Administered 2020-04-16: 10 mg via INTRAVENOUS

## 2020-04-16 MED ORDER — ACETAMINOPHEN 500 MG PO TABS
ORAL_TABLET | ORAL | Status: AC
  Administered 2020-04-16: 1000 mg via ORAL
  Filled 2020-04-16: qty 2

## 2020-04-16 MED ORDER — FAMOTIDINE 20 MG PO TABS
ORAL_TABLET | ORAL | Status: AC
  Administered 2020-04-16: 20 mg via ORAL
  Filled 2020-04-16: qty 1

## 2020-04-16 SURGICAL SUPPLY — 40 items
ADH SKN CLS APL DERMABOND .7 (GAUZE/BANDAGES/DRESSINGS) ×1
APL PRP STRL LF DISP 70% ISPRP (MISCELLANEOUS) ×1
BLADE CLIPPER SURG (BLADE) ×2 IMPLANT
CANISTER SUCT 1200ML W/VALVE (MISCELLANEOUS) ×2 IMPLANT
CHLORAPREP W/TINT 26 (MISCELLANEOUS) ×2 IMPLANT
COVER WAND RF STERILE (DRAPES) ×2 IMPLANT
DERMABOND ADVANCED (GAUZE/BANDAGES/DRESSINGS) ×1
DERMABOND ADVANCED .7 DNX12 (GAUZE/BANDAGES/DRESSINGS) ×1 IMPLANT
DRAIN PENROSE 5/8X18 LTX STRL (DRAIN) ×2 IMPLANT
DRAPE LAPAROTOMY 77X122 PED (DRAPES) ×2 IMPLANT
ELECT CAUTERY BLADE TIP 2.5 (TIP) ×2
ELECT REM PT RETURN 9FT ADLT (ELECTROSURGICAL) ×2
ELECTRODE CAUTERY BLDE TIP 2.5 (TIP) ×1 IMPLANT
ELECTRODE REM PT RTRN 9FT ADLT (ELECTROSURGICAL) ×1 IMPLANT
GLOVE BIO SURGEON STRL SZ 6.5 (GLOVE) ×2 IMPLANT
GLOVE INDICATOR 7.0 STRL GRN (GLOVE) ×4 IMPLANT
GOWN STRL REUS W/ TWL LRG LVL3 (GOWN DISPOSABLE) ×2 IMPLANT
GOWN STRL REUS W/TWL LRG LVL3 (GOWN DISPOSABLE) ×4
KIT TURNOVER KIT A (KITS) ×2 IMPLANT
LABEL OR SOLS (LABEL) ×2 IMPLANT
MESH HERNIA 6X12 ULTRAPRO MED (Mesh General) IMPLANT
MESH HERNIA ULTRAPRO MED (Mesh General) ×1 IMPLANT
NEEDLE HYPO 22GX1.5 SAFETY (NEEDLE) ×4 IMPLANT
NS IRRIG 500ML POUR BTL (IV SOLUTION) ×2 IMPLANT
PACK BASIN MINOR (MISCELLANEOUS) ×2 IMPLANT
SPONGE KITTNER 5P (MISCELLANEOUS) ×2 IMPLANT
SPONGE LAP 18X18 RF (DISPOSABLE) ×2 IMPLANT
STRIP CLOSURE SKIN 1/2X4 (GAUZE/BANDAGES/DRESSINGS) ×2 IMPLANT
SUT ETHIBOND NAB MO 7 #0 18IN (SUTURE) ×1 IMPLANT
SUT MNCRL 4-0 (SUTURE) ×2
SUT MNCRL 4-0 27XMFL (SUTURE) ×1
SUT PROLENE 2 0 SH DA (SUTURE) ×1 IMPLANT
SUT VIC AB 2-0 SH 27 (SUTURE) ×2
SUT VIC AB 2-0 SH 27XBRD (SUTURE) IMPLANT
SUT VIC AB 3-0 SH 27 (SUTURE) ×2
SUT VIC AB 3-0 SH 27X BRD (SUTURE) ×1 IMPLANT
SUTURE MNCRL 4-0 27XMF (SUTURE) ×1 IMPLANT
SYR 10ML LL (SYRINGE) ×2 IMPLANT
SYR 20ML LL LF (SYRINGE) ×2 IMPLANT
SYR BULB IRRIG 60ML STRL (SYRINGE) ×2 IMPLANT

## 2020-04-16 NOTE — Discharge Instructions (Signed)
Bupivacaine Liposomal Suspension for Injection °What is this medicine? °BUPIVACAINE LIPOSOMAL (bue PIV a kane LIP oh som al) is an anesthetic. It causes loss of feeling in the skin or other tissues. It is used to prevent and to treat pain from some procedures. °This medicine may be used for other purposes; ask your health care provider or pharmacist if you have questions. °COMMON BRAND NAME(S): EXPAREL °What should I tell my health care provider before I take this medicine? °They need to know if you have any of these conditions: °· G6PD deficiency °· heart disease °· kidney disease °· liver disease °· low blood pressure °· lung or breathing disease, like asthma °· an unusual or allergic reaction to bupivacaine, other medicines, foods, dyes, or preservatives °· pregnant or trying to get pregnant °· breast-feeding °How should I use this medicine? °This medicine is for injection into the affected area. It is given by a health care professional in a hospital or clinic setting. °Talk to your pediatrician regarding the use of this medicine in children. Special care may be needed. °Overdosage: If you think you have taken too much of this medicine contact a poison control center or emergency room at once. °NOTE: This medicine is only for you. Do not share this medicine with others. °What if I miss a dose? °This does not apply. °What may interact with this medicine? °This medicine may interact with the following medications: °· acetaminophen °· certain antibiotics like dapsone, nitrofurantoin, aminosalicylic acid, sulfonamides °· certain medicines for seizures like phenobarbital, phenytoin, valproic acid °· chloroquine °· cyclophosphamide °· flutamide °· hydroxyurea °· ifosfamide °· metoclopramide °· nitric oxide °· nitroglycerin °· nitroprusside °· nitrous oxide °· other local anesthetics like lidocaine, pramoxine, tetracaine °· primaquine °· quinine °· rasburicase °· sulfasalazine °This list may not describe all possible  interactions. Give your health care provider a list of all the medicines, herbs, non-prescription drugs, or dietary supplements you use. Also tell them if you smoke, drink alcohol, or use illegal drugs. Some items may interact with your medicine. °What should I watch for while using this medicine? °Your condition will be monitored carefully while you are receiving this medicine. °Be careful to avoid injury while the area is numb, and you are not aware of pain. °What side effects may I notice from receiving this medicine? °Side effects that you should report to your doctor or health care professional as soon as possible: °· allergic reactions like skin rash, itching or hives, swelling of the face, lips, or tongue °· seizures °· signs and symptoms of a dangerous change in heartbeat or heart rhythm like chest pain; dizziness; fast, irregular heartbeat; palpitations; feeling faint or lightheaded; falls; breathing problems °· signs and symptoms of methemoglobinemia such as pale, gray, or blue colored skin; headache; fast heartbeat; shortness of breath; feeling faint or lightheaded, falls; tiredness °Side effects that usually do not require medical attention (report to your doctor or health care professional if they continue or are bothersome): °· anxious °· back pain °· changes in taste °· changes in vision °· constipation °· dizziness °· fever °· nausea, vomiting °This list may not describe all possible side effects. Call your doctor for medical advice about side effects. You may report side effects to FDA at 1-800-FDA-1088. °Where should I keep my medicine? °This drug is given in a hospital or clinic and will not be stored at home. °NOTE: This sheet is a summary. It may not cover all possible information. If you have questions about this   medicine, talk to your doctor, pharmacist, or health care provider. °© 2020 Elsevier/[employer] (2019-08-12 10:48:23) ° ° °AMBULATORY SURGERY  °DISCHARGE INSTRUCTIONS ° ° °1) The  drugs that you were given will stay in your system until tomorrow so for the next 24 hours you should not: ° °A) Drive an automobile °B) Make any legal decisions °C) Drink any alcoholic beverage ° ° °2) You may resume regular meals tomorrow.  Today it is better to start with liquids and gradually work up to solid foods. ° °You may eat anything you prefer, but it is better to start with liquids, then soup and crackers, and gradually work up to solid foods. ° ° °3) Please notify your doctor immediately if you have any unusual bleeding, trouble breathing, redness and pain at the surgery site, drainage, fever, or pain not relieved by medication. ° ° ° °4) Additional Instructions: ° ° ° ° ° ° ° °Please contact your physician with any problems or Same Day Surgery at [phone], Monday through Friday 6 am to 4 pm, or [facility] number at [phone]. °

## 2020-04-16 NOTE — Op Note (Signed)
Hernia, Open, Procedure Note  Indications: The patient presented with a history of a right, reducible inguinal hernia.    Pre-operative Diagnosis: right reducible inguinal hernia  Post-operative Diagnosis: same, direct, with cord lipoma.  Surgeon: Fredirick Maudlin   Assistants: Armida Sans, RNFA  Anesthesia: General endotracheal anesthesia  ASA Class: 3  Procedure Details  The patient was seen again in the Holding Room. The risks, benefits, complications, treatment options, and expected outcomes were discussed with the patient. The possibilities of reaction to medication, pulmonary aspiration, perforation of viscus, bleeding, recurrent infection, the need for additional procedures, and development of a complication requiring transfusion or further operation were discussed with the patient and/or family. There was concurrence with the proposed plan, and informed consent was obtained. The site of surgery was properly noted/marked. The patient was taken to the Operating Room, identified as Nicholas Torres, and the procedure verified as hernia repair. A Time Out was held and the above information confirmed.  The patient was placed in the supine position and underwent induction of anesthesia, the lower abdomen and groin was prepped and draped in the standard fashion.  A one-to-one mixture of 0.25% bupivacaine and 1% lidocaine  with epinephrine was used to anesthetize the skin over the mid-portion of the inguinal canal. A transverse incision was made. Dissection was carried through the soft tissue to expose the inguinal canal and inguinal ligament along its lower edge.  There were inguinal lymph nodes that appeared enlarged and these were removed and sent for pathology.  The external oblique fascia was split along the course of its fibers, exposing the inguinal canal. The cord and nerve were looped using a Penrose drain and reflected out of the field.  There was a small cord lipoma that was dissected  off of the cord and removed.  The defect was identified medial to the inferior epigastric vessels, confirming this as a direct hernia.  The defect was exposed and a medium Ultra Pro mesh was trimmed to size.  The disc portion was used to obliterate the direct defect.  The mesh was then sutured to the pubic tubercle.  Interrupted 0 Ethibond sutures were used to secure the mesh to the shelving edge of the inguinal ligament laterally and the conjoined tendon medially.  The cord was brought through a split in the mesh and the tails were tacked together to recreate the internal ring. The contents were then returned to canal and the external oblique fashion was then closed in a continuous fashion using 2-0 Vicryl suture, taking care not to cause entrapment of the nerve.  Liposomal bupivacaine was infiltrated along the fascial planes.  The wound was then closed in 2 layers with a deep dermal 3-0 Vicryl layer and a running subcuticular 4-0 Monocryl layer.  The skin was cleaned.  Dermabond and Steri-Strips were applied.  The patient was awakened, extubated, and taken to the postanesthesia care unit in good condition.  Instrument, sponge, and needle counts were correct prior to closure and at the conclusion of the case.  Findings: Hernia as above  Estimated Blood Loss: Minimal         Drains: None         Total IV Fluids: See anesthesia record         Specimens: Inguinal lymph nodes               Complications: None; patient tolerated the procedure well.         Disposition: PACU - hemodynamically stable.  Plan  for discharge to home.         Condition: stable

## 2020-04-16 NOTE — Anesthesia Preprocedure Evaluation (Addendum)
Anesthesia Evaluation  Patient identified by MRN, date of birth, ID band Patient awake    Reviewed: Allergy & Precautions, H&P , NPO status , Patient's Chart, lab work & pertinent test results  Airway Mallampati: II  TM Distance: >3 FB Neck ROM: full    Dental  (+) Teeth Intact   Pulmonary neg pulmonary ROS, neg COPD,    breath sounds clear to auscultation       Cardiovascular hypertension, + CAD, + Past MI, + CABG (2017) and +CHF   Rhythm:regular Rate:Normal  Grade 1 diastolic dysfunction on most recent echo in 2017 R carotid endarterectomy March 2021   Neuro/Psych negative neurological ROS  negative psych ROS   GI/Hepatic negative GI ROS, Neg liver ROS,   Endo/Other  negative endocrine ROS  Renal/GU      Musculoskeletal   Abdominal   Peds  Hematology negative hematology ROS (+)   Anesthesia Other Findings Past Medical History: No date: Arthritis     Comment:  back 2017: CAD (coronary artery disease) No date: CHF (congestive heart failure) (HCC) No date: High cholesterol No date: Hypertension 2017: Myocardial infarction ([place]) No date: Pollen allergy  Past Surgical History: 07/24/2016: CARDIAC CATHETERIZATION; Right     Comment:  Procedure: Left Heart Cath and Coronary Angiography;                Surgeon: Dionisio David, MD;  Location: [facility];  Service: Cardiovascular;  Laterality: Right; 07/26/2016: CORONARY ARTERY BYPASS GRAFT; N/A     Comment:  Procedure: CORONARY ARTERY BYPASS GRAFTING (CABG), ON               PUMP, TIMES FOUR, USING BILATERAL INTERNAL MAMMARY               ARTERIES, RIGHT GREATER SAPHENOUS VEIN HARVESTED               ENDOSCOPICALLY;  Surgeon: Melrose Nakayama, MD;                Location: [facility];  Service: Open Heart Surgery;                Laterality: N/A; 01/22/2020: ENDARTERECTOMY; Right     Comment:  Procedure: ENDARTERECTOMY CAROTID;  Surgeon: Algernon Huxley, MD;  Location: [facility];  Service: Vascular;                Laterality: Right; 07/26/2016: RADIAL ARTERY HARVEST; Left     Comment:  Procedure: RADIAL LEFT ARTERY HARVEST;  Surgeon: Melrose Nakayama, MD;  Location: [place];  Service: Open Heart              Surgery;  Laterality: Left; 07/26/2016: TEE WITHOUT CARDIOVERSION; N/A     Comment:  Procedure: TRANSESOPHAGEAL ECHOCARDIOGRAM (TEE);                Surgeon: Melrose Nakayama, MD;  Location: [facility];                Service: Open Heart Surgery;  Laterality: N/A;  BMI    Body Mass Index: 29.62 kg/m      Reproductive/Obstetrics negative OB ROS  Anesthesia Physical Anesthesia Plan  ASA: II  Anesthesia Plan: General ETT   Post-op Pain Management:    Induction:   PONV Risk Score and Plan: Ondansetron, Dexamethasone, Midazolam and Treatment may vary due to age or medical condition  Airway Management Planned:   Additional Equipment:   Intra-op Plan:   Post-operative Plan:   Informed Consent: I have reviewed the patients History and Physical, chart, labs and discussed the procedure including the risks, benefits and alternatives for the proposed anesthesia with the patient or authorized representative who has indicated his/her understanding and acceptance.     Dental Advisory Given  Plan Discussed with: Anesthesiologist, CRNA and Surgeon  Anesthesia Plan Comments:         Anesthesia Quick Evaluation

## 2020-04-16 NOTE — Anesthesia Procedure Notes (Addendum)
Procedure Name: Intubation Date/Time: 04/16/2020 9:57 AM Performed by: Lily Peer, Summer, RN Pre-anesthesia Checklist: Patient identified, Emergency Drugs available, Suction available and Patient being monitored Patient Re-evaluated:Patient Re-evaluated prior to induction Oxygen Delivery Method: Circle system utilized Preoxygenation: Pre-oxygenation with 100% oxygen Induction Type: IV induction Ventilation: Mask ventilation without difficulty and Oral airway inserted - appropriate to patient size Laryngoscope Size: McGraph and 4 Grade View: Grade I Tube type: Oral Tube size: 7.5 mm Number of attempts: 1 Airway Equipment and Method: Stylet Placement Confirmation: ETT inserted through vocal cords under direct vision,  positive ETCO2 and breath sounds checked- equal and bilateral Secured at: 23 cm Tube secured with: Tape Dental Injury: Teeth and Oropharynx as per pre-operative assessment

## 2020-04-16 NOTE — Interval H&P Note (Signed)
History and Physical Interval Note:  04/16/2020 9:34 AM  Nicholas Torres  has presented today for surgery, with the diagnosis of Right inguinal hernia.  The various methods of treatment have been discussed with the patient and family. After consideration of risks, benefits and other options for treatment, the patient has consented to  Procedure(s): HERNIA REPAIR INGUINAL ADULT (Right) as a surgical intervention.  The patient's history has been reviewed, patient examined, no change in status, stable for surgery.  I have reviewed the patient's chart and labs.  Questions were answered to the patient's satisfaction.     Fredirick Maudlin

## 2020-04-16 NOTE — Transfer of Care (Signed)
Immediate Anesthesia Transfer of Care Note  Patient: Nicholas Torres  Procedure(s) Performed: HERNIA REPAIR INGUINAL ADULT (Right Abdomen)  Patient Location: PACU  Anesthesia Type:General  Level of Consciousness: drowsy  Airway & Oxygen Therapy: Patient Spontanous Breathing and Patient connected to face mask oxygen  Post-op Assessment: Report given to RN and Post -op Vital signs reviewed and stable  Post vital signs: Reviewed and stable  Last Vitals:  Vitals Value Taken Time  BP 141/96 04/16/20 1128  Temp 36.3 C 04/16/20 1128  Pulse 64 04/16/20 1130  Resp 13 04/16/20 1130  SpO2 100 % 04/16/20 1130  Vitals shown include unvalidated device data.  Last Pain:  Vitals:   04/16/20 0811  TempSrc: Tympanic  PainSc: 0-No pain         Complications: No apparent anesthesia complications

## 2020-04-17 NOTE — Anesthesia Postprocedure Evaluation (Signed)
Anesthesia Post Note  Patient: Nicholas Torres  Procedure(s) Performed: HERNIA REPAIR INGUINAL ADULT (Right Abdomen)  Patient location during evaluation: PACU Anesthesia Type: General Level of consciousness: awake and alert Pain management: pain level controlled Vital Signs Assessment: post-procedure vital signs reviewed and stable Respiratory status: spontaneous breathing, nonlabored ventilation and respiratory function stable Cardiovascular status: blood pressure returned to baseline and stable Postop Assessment: no apparent nausea or vomiting Anesthetic complications: no     Last Vitals:  Vitals:   04/16/20 1242 04/16/20 1319  BP: 135/87 133/81  Pulse: (!) 57 (!) 57  Resp: 16 18  Temp: (!) 36.3 C   SpO2: 100% 100%    Last Pain:  Vitals:   04/16/20 1319  TempSrc:   PainSc: 0-No pain                 Tera Mater

## 2020-04-19 LAB — SURGICAL PATHOLOGY

## 2020-04-22 LAB — POCT I-STAT, CHEM 8
BUN: 11 mg/dL (ref 6–20)
Calcium, Ion: 1.23 mmol/L (ref 1.15–1.40)
Chloride: 104 mmol/L (ref 98–111)
Creatinine, Ser: 1.2 mg/dL (ref 0.61–1.24)
Glucose, Bld: 88 mg/dL (ref 70–99)
HCT: 43 % (ref 39.0–52.0)
Hemoglobin: 14.6 g/dL (ref 13.0–17.0)
Potassium: 3.3 mmol/L — ABNORMAL LOW (ref 3.5–5.1)
Sodium: 144 mmol/L (ref 135–145)
TCO2: 26 mmol/L (ref 22–32)

## 2020-05-07 ENCOUNTER — Ambulatory Visit (INDEPENDENT_AMBULATORY_CARE_PROVIDER_SITE_OTHER): Payer: Self-pay | Admitting: Physician Assistant

## 2020-05-07 ENCOUNTER — Encounter: Payer: Self-pay | Admitting: Physician Assistant

## 2020-05-07 ENCOUNTER — Other Ambulatory Visit: Payer: Self-pay

## 2020-05-07 VITALS — BP 123/86 | HR 67 | Temp 94.3°F | Ht 65.0 in | Wt 181.4 lb

## 2020-05-07 DIAGNOSIS — K4090 Unilateral inguinal hernia, without obstruction or gangrene, not specified as recurrent: Secondary | ICD-10-CM

## 2020-05-07 DIAGNOSIS — Z09 Encounter for follow-up examination after completed treatment for conditions other than malignant neoplasm: Secondary | ICD-10-CM

## 2020-05-07 NOTE — Progress Notes (Signed)
[facility] POST-OP OFFICE VISIT  05/07/2020  HPI: Nicholas Torres is a 59 y.o. male 21 days s/p right inguinal hernia repair with Dr Celine Ahr  Overall doing well Minimal pain with positional changes, managed with tylenol alone No fever, chills, nausea, or emesis Tolerating PO, no issues with bowel function Reports incision feels swollen but no erythema or drainage No other complaints   Vital signs: BP 123/86   Pulse 67   Temp (!) 94.3 F (34.6 C) (Temporal)   Ht 5\' 5"  (1.651 m)   Wt 181 lb 6.4 oz (82.3 kg)   SpO2 100%   BMI 30.19 kg/m    Physical Exam: Constitutional: Well appearing male, NAD Abdomen: Soft, nontender, non-distended, no rebound/guarding Skin: Right inguinal incision is well healed, no erythema or drainage, this is still fairly indurated  Assessment/Plan: This is a 59 y.o. male 21 days s/p right inguinal hernia repair   - Reviewed post-op restrictions; complete 6 weeks of no lifting, pushing, pulling more than 15-20 lbs. He will complete this on July 16th, letter provided  - Reviewed wound care; no issues  - Continue Tylenol prn for pain  - rtc prn; advised to call with questions or concerns   -- Edison Simon, PA-C [facility] 05/07/2020, 10:34 AM [phone] M-F: 7am - 4pm

## 2020-05-07 NOTE — Patient Instructions (Addendum)
Otho Ket, PA advised patient to take Tylenol to help with pain or discomfort. Patient is refrain from any heavy lifting, bending, pushing or pulling of 15-20 pounds or more for six weeks total.  Follow-up with our office as needed.  Please call and ask to speak with a nurse if you develop questions or concerns.    GENERAL POST-OPERATIVE PATIENT INSTRUCTIONS   FOLLOW-UP:  Please make an appointment with your physician in.  Call your physician immediately if you have any fevers greater than 102.5, drainage from you wound that is not clear or looks infected, persistent bleeding, increasing abdominal pain, problems urinating, or persistent nausea/vomiting.    WOUND CARE INSTRUCTIONS:  Keep a dry clean dressing on the wound if there is drainage. The initial bandage may be removed after 24 hours.  Once the wound has quit draining you may leave it open to air.  If clothing rubs against the wound or causes irritation and the wound is not draining you may cover it with a dry dressing during the daytime.  Try to keep the wound dry and avoid ointments on the wound unless directed to do so.  If the wound becomes bright red and painful or starts to drain infected material that is not clear, please contact your physician immediately.  If the wound is mildly pink and has a thick firm ridge underneath it, this is normal, and is referred to as a healing ridge.  This will resolve over the next 4-6 weeks.  DIET:  You may eat any foods that you can tolerate.  It is a good idea to eat a high fiber diet and take in plenty of fluids to prevent constipation.  If you do become constipated you may want to take a mild laxative or take ducolax tablets on a daily basis until your bowel habits are regular.  Constipation can be very uncomfortable, along with straining, after recent surgery.  ACTIVITY:  You are encouraged to cough and deep breath or use your incentive spirometer if you were given one, every 15-30 minutes when  awake.  This will help prevent respiratory complications and low grade fevers post-operatively if you had a general anesthetic.  You may want to hug a pillow when coughing and sneezing to add additional support to the surgical area, if you had abdominal or chest surgery, which will decrease pain during these times.  You are encouraged to walk and engage in light activity for the next two weeks.  You should not lift more than 20 pounds during this time frame as it could put you at increased risk for complications.  Twenty pounds is roughly equivalent to a plastic bag of groceries.    MEDICATIONS:  Try to take narcotic medications and anti-inflammatory medications, such as tylenol, ibuprofen, naprosyn, etc., with food.  This will minimize stomach upset from the medication.  Should you develop nausea and vomiting from the pain medication, or develop a rash, please discontinue the medication and contact your physician.  You should not drive, make important decisions, or operate machinery when taking narcotic pain medication.  QUESTIONS:  Please feel free to call your physician or the hospital operator if you have any questions, and they will be glad to assist you.

## 2020-06-14 ENCOUNTER — Telehealth: Payer: Self-pay

## 2020-06-14 NOTE — Telephone Encounter (Signed)
Confirmed appointment on 06/16/2020 and screened for covid. [facility]

## 2020-06-16 ENCOUNTER — Ambulatory Visit: Payer: PRIVATE HEALTH INSURANCE | Admitting: Adult Health

## 2020-06-16 ENCOUNTER — Other Ambulatory Visit: Payer: Self-pay

## 2020-06-16 ENCOUNTER — Encounter: Payer: Self-pay | Admitting: Adult Health

## 2020-06-16 VITALS — BP 107/73 | HR 70 | Temp 97.4°F | Resp 16 | Ht 65.0 in | Wt 181.8 lb

## 2020-06-16 DIAGNOSIS — I10 Essential (primary) hypertension: Secondary | ICD-10-CM | POA: Diagnosis not present

## 2020-06-16 DIAGNOSIS — I700 Atherosclerosis of aorta: Secondary | ICD-10-CM | POA: Diagnosis not present

## 2020-06-16 DIAGNOSIS — E782 Mixed hyperlipidemia: Secondary | ICD-10-CM | POA: Diagnosis not present

## 2020-06-16 DIAGNOSIS — T7840XD Allergy, unspecified, subsequent encounter: Secondary | ICD-10-CM | POA: Diagnosis not present

## 2020-06-16 MED ORDER — FLUTICASONE PROPIONATE 50 MCG/ACT NA SUSP: 1.0000 | NASAL | 3 refills | Status: DC | PRN

## 2020-06-16 NOTE — Progress Notes (Signed)
[facility] [place], [place] [address]  Internal MEDICINE  Office Visit Note  Patient Name: Nicholas Torres  [id number]  [id number]  Date of Service: 06/22/2020  Chief Complaint  Patient presents with   Follow-up   Congestive Heart Failure   Hyperlipidemia   Hypertension   Quality Metric Gaps    tdap   Neck Pain    neck surgery march pulles when turning head   Dizziness    when bending over fells lightheaded.     HPI  Pt is here for follow up on HLD, and HTN.  Since our last visit he did see general surgery and had an inguinal hernia repair. His blood pressure is well controlled. Denies Chest pain, Shortness of breath, palpitations, headache, or blurred vision. 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 He does keloid and his scar causes a pulling sensation when he turns his head to the left. He is using scar treatment on this keloid currently. He reports some intermittent episodes of dizziness, usually when he stands up from bending over.   Current Medication: Outpatient Encounter Medications as of 06/16/2020  Medication Sig   ASPIRIN LOW DOSE 81 MG EC tablet Take 81 mg by mouth daily.   clopidogrel (PLAVIX) 75 MG tablet TAKE 1 TABLET BY MOUTH DAILY AT 6 AM.   cyclobenzaprine (FLEXERIL) 5 MG tablet Take 10 mg by mouth 3 (three) times daily as needed for muscle spasms.    fluticasone (FLONASE) 50 MCG/ACT nasal spray Place 1 spray into both nostrils as needed.   gabapentin (NEURONTIN) 300 MG capsule Take 300 mg by mouth daily with lunch.    HYDROcodone-acetaminophen (NORCO/VICODIN) 5-325 MG tablet Take 1 tablet by mouth every 6 (six) hours as needed for moderate pain.   ibuprofen (ADVIL) 800 MG tablet Take 1 tablet (800 mg total) by mouth every 8 (eight) hours as needed.   lisinopril-hydrochlorothiazide (PRINZIDE,ZESTORETIC) 10-12.5 MG tablet Take 1 tablet by mouth daily. (Patient taking  differently: Take 1 tablet by mouth every morning. )   Menthol-Methyl Salicylate (ICY HOT EXTRA STRENGTH) 10-30 % CREA Apply topically 3 (three) times daily as needed.   metoprolol tartrate (LOPRESSOR) 25 MG tablet Take 0.5 tablets (12.5 mg total) by mouth daily. (Patient taking differently: Take 12.5 mg by mouth every morning. )   REPATHA SURECLICK 924 MG/ML SOAJ SMARTSIG:1 Injection SUB-Q Every 2 Weeks   rosuvastatin (CRESTOR) 40 MG tablet Take 40 mg by mouth every morning.    [DISCONTINUED] fluticasone (FLONASE) 50 MCG/ACT nasal spray Place 1 spray into both nostrils daily. (Patient taking differently: Place 1 spray into both nostrils as needed. )   No facility-administered encounter medications on file as of 06/16/2020.    Surgical History: Past Surgical History:  Procedure Laterality Date   CARDIAC CATHETERIZATION Right 07/24/2016   Procedure: Left Heart Cath and Coronary Angiography;  Surgeon: Dionisio David, MD;  Location: [place] [facility];  Service: Cardiovascular;  Laterality: Right;   CORONARY ARTERY BYPASS GRAFT N/A 07/26/2016   Procedure: CORONARY ARTERY BYPASS GRAFTING (CABG), ON PUMP, TIMES FOUR, USING BILATERAL INTERNAL MAMMARY ARTERIES, RIGHT GREATER SAPHENOUS VEIN HARVESTED ENDOSCOPICALLY;  Surgeon: Melrose Nakayama, MD;  Location: [place];  Service: Open Heart Surgery;  Laterality: N/A;   ENDARTERECTOMY Right 01/22/2020   Procedure: ENDARTERECTOMY CAROTID;  Surgeon: Algernon Huxley, MD;  Location: [facility];  Service: Vascular;  Laterality: Right;   INGUINAL HERNIA REPAIR Right 04/16/2020  Procedure: HERNIA REPAIR INGUINAL ADULT;  Surgeon: Fredirick Maudlin, MD;  Location: [facility];  Service: General;  Laterality: Right;   RADIAL ARTERY HARVEST Left 07/26/2016   Procedure: RADIAL LEFT ARTERY HARVEST;  Surgeon: Melrose Nakayama, MD;  Location: [place];  Service: Open Heart Surgery;  Laterality: Left;   TEE WITHOUT CARDIOVERSION N/A 07/26/2016   Procedure:  TRANSESOPHAGEAL ECHOCARDIOGRAM (TEE);  Surgeon: Melrose Nakayama, MD;  Location: [place];  Service: Open Heart Surgery;  Laterality: N/A;    Medical History: Past Medical History:  Diagnosis Date   Arthritis    back   CAD (coronary artery disease) 2017   CHF (congestive heart failure) (HCC)    High cholesterol    Hypertension    Myocardial infarction ([place]) 2017   Pollen allergy     Family History: Family History  Problem Relation Age of Onset   Diabetes Mother    Heart attack Father    CAD Brother     Social History   Socioeconomic History   Marital status: Married    Spouse name: Not on file   Number of children: Not on file   Years of education: Not on file   Highest education level: Not on file  Occupational History   Not on file  Tobacco Use   Smoking status: Never Smoker   Smokeless tobacco: Never Used  Vaping Use   Vaping Use: Never used  Substance and Sexual Activity   Alcohol use: No   Drug use: No   Sexual activity: Yes  Other Topics Concern   Not on file  Social History Narrative   Not on file   Social Determinants of Health   Financial Resource Strain:    Difficulty of Paying Living Expenses:   Food Insecurity:    Worried About Charity fundraiser in the Last Year:    Arboriculturist in the Last Year:   Transportation Needs:    Film/video editor (Medical):    Lack of Transportation (Non-Medical):   Physical Activity:    Days of Exercise per Week:    Minutes of Exercise per Session:   Stress:    Feeling of Stress :   Social Connections:    Frequency of Communication with Friends and Family:    Frequency of Social Gatherings with Friends and Family:    Attends Religious Services:    Active Member of Clubs or Organizations:    Attends Music therapist:    Marital Status:   Intimate Partner Violence:    Fear of Current or Ex-Partner:    Emotionally Abused:    Physically Abused:     Sexually Abused:       Review of Systems  Constitutional: Negative.  Negative for chills, fatigue and unexpected weight change.  HENT: Negative.  Negative for congestion, rhinorrhea, sneezing and sore throat.   Eyes: Negative for redness.  Respiratory: Negative.  Negative for cough, chest tightness and shortness of breath.   Cardiovascular: Negative.  Negative for chest pain and palpitations.  Gastrointestinal: Negative.  Negative for abdominal pain, constipation, diarrhea, nausea and vomiting.  Endocrine: Negative.   Genitourinary: Negative.  Negative for dysuria and frequency.  Musculoskeletal: Negative.  Negative for arthralgias, back pain, joint swelling and neck pain.  Skin: Negative.  Negative for rash.  Allergic/Immunologic: Negative.   Neurological: Negative.  Negative for tremors and numbness.  Hematological: Negative for adenopathy. Does not bruise/bleed easily.  Psychiatric/Behavioral: Negative.  Negative for behavioral problems, sleep  disturbance and suicidal ideas. The patient is not nervous/anxious.     Vital Signs: BP 107/73    Pulse 70    Temp (!) 97.4 F (36.3 C)    Resp 16    Ht 5\' 5"  (1.651 m)    Wt 181 lb 12.8 oz (82.5 kg)    SpO2 99%    BMI 30.25 kg/m    Physical Exam Vitals and nursing note reviewed.  Constitutional:      General: He is not in acute distress.    Appearance: He is well-developed. He is not diaphoretic.  HENT:     Head: Normocephalic and atraumatic.     Mouth/Throat:     Pharynx: No oropharyngeal exudate.  Eyes:     Pupils: Pupils are equal, round, and reactive to light.  Neck:     Thyroid: No thyromegaly.     Vascular: No JVD.     Trachea: No tracheal deviation.  Cardiovascular:     Rate and Rhythm: Normal rate and regular rhythm.     Heart sounds: Normal heart sounds. No murmur heard.  No friction rub. No gallop.   Pulmonary:     Effort: Pulmonary effort is normal. No respiratory distress.     Breath sounds: Normal breath  sounds. No wheezing or rales.  Chest:     Chest wall: No tenderness.  Abdominal:     Palpations: Abdomen is soft.     Tenderness: There is no abdominal tenderness. There is no guarding.  Musculoskeletal:        General: Normal range of motion.     Cervical back: Normal range of motion and neck supple.  Lymphadenopathy:     Cervical: No cervical adenopathy.  Skin:    General: Skin is warm and dry.  Neurological:     Mental Status: He is alert and oriented to person, place, and time.     Cranial Nerves: No cranial nerve deficit.  Psychiatric:        Behavior: Behavior normal.        Thought Content: Thought content normal.        Judgment: Judgment normal.     Assessment/Plan: 1. Essential hypertension Stable, continue current management.   2. Mixed hyperlipidemia Continue crestor.   3. Aortic atherosclerosis ([place]) Continue crestor  4. Allergy, subsequent encounter - fluticasone (FLONASE) 50 MCG/ACT nasal spray; Place 1 spray into both nostrils as needed.  Dispense: 16 g; Refill: 3  General Counseling: Jeorge verbalizes understanding of the findings of todays visit and agrees with plan of treatment. I have discussed any further diagnostic evaluation that may be needed or ordered today. We also reviewed his medications today. he has been encouraged to call the office with any questions or concerns that should arise related to todays visit.    No orders of the defined types were placed in this encounter.   Meds ordered this encounter  Medications   fluticasone (FLONASE) 50 MCG/ACT nasal spray    Sig: Place 1 spray into both nostrils as needed.    Dispense:  16 g    Refill:  3    Time spent: 30 Minutes   This patient was seen by Orson Gear AGNP-C in Collaboration with Dr Lavera Guise as a part of collaborative care agreement     Kendell Bane AGNP-C Internal medicine

## 2020-07-07 ENCOUNTER — Other Ambulatory Visit (INDEPENDENT_AMBULATORY_CARE_PROVIDER_SITE_OTHER): Payer: Self-pay | Admitting: Vascular Surgery

## 2020-08-24 ENCOUNTER — Other Ambulatory Visit: Payer: Self-pay

## 2020-08-24 DIAGNOSIS — I10 Essential (primary) hypertension: Secondary | ICD-10-CM

## 2020-08-24 MED ORDER — METOPROLOL TARTRATE 25 MG PO TABS: 12.5000 mg | ORAL_TABLET | Freq: Every day | ORAL | 2 refills | Status: DC

## 2020-09-03 ENCOUNTER — Encounter: Payer: Self-pay | Admitting: Adult Health

## 2020-09-03 ENCOUNTER — Ambulatory Visit: Payer: BC Managed Care – PPO | Admitting: Adult Health

## 2020-09-03 ENCOUNTER — Other Ambulatory Visit: Payer: Self-pay

## 2020-09-03 VITALS — BP 106/90 | HR 84 | Temp 97.7°F | Resp 16 | Ht 65.0 in | Wt 178.4 lb

## 2020-09-03 DIAGNOSIS — R413 Other amnesia: Secondary | ICD-10-CM

## 2020-09-03 DIAGNOSIS — R0683 Snoring: Secondary | ICD-10-CM | POA: Diagnosis not present

## 2020-09-03 DIAGNOSIS — R519 Headache, unspecified: Secondary | ICD-10-CM

## 2020-09-03 MED ORDER — BUTALBITAL-APAP-CAFFEINE 50-325-40 MG PO TABS: 1.0000 | ORAL_TABLET | Freq: Four times a day (QID) | ORAL | 0 refills | Status: DC | PRN

## 2020-09-03 NOTE — Progress Notes (Signed)
[facility] [place], [place] [address]  Internal MEDICINE  Office Visit Note  Patient Name: Nicholas Torres  [id number]  [id number]  Date of Service: 09/03/2020  Chief Complaint  Patient presents with  . Acute Visit  . Migraine    more frequent, not able to sleep for the last few nights, more painful than normal, today its not as bad as last night, had to sleep in chair  . Hypertension  . Hyperlipidemia  . policy update form     HPI Pt is here for a sick visit. Pt reports he has been having headaches more frequently over the last year.  He reports that early on he would take tylenol and it would take care of it.  Recently however the tylenol has not been helping.  He reports the pain never really goes away completely.  The headaches will get worse periodically through the day, and especially at night when he lays down.  He reports that he is losing sleep, because to lay down makes it so much worse.  He has been sleeping in a recliner to help.  His blood pressure is well controlled.  He denies any visual changes with the headache. He denies any issues with gait disturbance or photophobia.  He also reports memory issues.  He quickly forgets what he is doing while he is doing it.  For instance he will go to get his keys from the kitchen, and he will forget them when he walks into the room. He reports this has been slowly getting worse over the last year.  He did have Carotid enterectomy in March 2021.       Current Medication:  Outpatient Encounter Medications as of 09/03/2020  Medication Sig  . ASPIRIN LOW DOSE 81 MG EC tablet Take 81 mg by mouth daily.  . clopidogrel (PLAVIX) 75 MG tablet TAKE 1 TABLET BY MOUTH DAILY AT 6 AM.  . fluticasone (FLONASE) 50 MCG/ACT nasal spray Place 1 spray into both nostrils as needed.  Marland Kitchen lisinopril-hydrochlorothiazide (PRINZIDE,ZESTORETIC) 10-12.5 MG tablet Take 1 tablet by mouth daily. (Patient taking differently: Take 1  tablet by mouth every morning. )  . metoprolol tartrate (LOPRESSOR) 25 MG tablet Take 0.5 tablets (12.5 mg total) by mouth daily.  Marland Kitchen REPATHA SURECLICK 798 MG/ML SOAJ SMARTSIG:1 Injection SUB-Q Every 2 Weeks  . rosuvastatin (CRESTOR) 40 MG tablet Take 40 mg by mouth every morning.   . butalbital-acetaminophen-caffeine (FIORICET) 50-325-40 MG tablet Take 1-2 tablets by mouth every 6 (six) hours as needed for headache.  . [DISCONTINUED] cyclobenzaprine (FLEXERIL) 5 MG tablet Take 10 mg by mouth 3 (three) times daily as needed for muscle spasms.  (Patient not taking: Reported on 09/03/2020)  . [DISCONTINUED] gabapentin (NEURONTIN) 300 MG capsule Take 300 mg by mouth daily with lunch.  (Patient not taking: Reported on 09/03/2020)  . [DISCONTINUED] HYDROcodone-acetaminophen (NORCO/VICODIN) 5-325 MG tablet Take 1 tablet by mouth every 6 (six) hours as needed for moderate pain. (Patient not taking: Reported on 09/03/2020)  . [DISCONTINUED] ibuprofen (ADVIL) 800 MG tablet Take 1 tablet (800 mg total) by mouth every 8 (eight) hours as needed. (Patient not taking: Reported on 09/03/2020)  . [DISCONTINUED] Menthol-Methyl Salicylate (ICY HOT EXTRA STRENGTH) 10-30 % CREA Apply topically 3 (three) times daily as needed. (Patient not taking: Reported on 09/03/2020)   No facility-administered encounter medications on file as of 09/03/2020.      Medical History: Past Medical History:  Diagnosis Date  . Arthritis  back  . CAD (coronary artery disease) 2017  . CHF (congestive heart failure) ([place])   . High cholesterol   . Hypertension   . Myocardial infarction ([place]) 2017  . Pollen allergy      Vital Signs: BP 106/90   Pulse 84   Temp 97.7 F (36.5 C)   Resp 16   Ht 5\' 5"  (1.651 m)   Wt 178 lb 6.4 oz (80.9 kg)   SpO2 99%   BMI 29.69 kg/m    Review of Systems  Constitutional: Negative.  Negative for chills, fatigue and unexpected weight change.  HENT: Negative.  Negative for congestion,  rhinorrhea, sneezing and sore throat.   Eyes: Negative for redness.  Respiratory: Negative.  Negative for cough, chest tightness and shortness of breath.   Cardiovascular: Negative.  Negative for chest pain and palpitations.  Gastrointestinal: Negative.  Negative for abdominal pain, constipation, diarrhea, nausea and vomiting.  Endocrine: Negative.   Genitourinary: Negative.  Negative for dysuria and frequency.  Musculoskeletal: Negative.  Negative for arthralgias, back pain, joint swelling and neck pain.  Skin: Negative.  Negative for rash.  Allergic/Immunologic: Negative.   Neurological: Negative.  Negative for tremors and numbness.  Hematological: Negative for adenopathy. Does not bruise/bleed easily.  Psychiatric/Behavioral: Negative.  Negative for behavioral problems, sleep disturbance and suicidal ideas. The patient is not nervous/anxious.     Physical Exam Vitals and nursing note reviewed.  Constitutional:      General: He is not in acute distress.    Appearance: He is well-developed. He is not diaphoretic.  HENT:     Head: Normocephalic and atraumatic.     Mouth/Throat:     Pharynx: No oropharyngeal exudate.  Eyes:     Pupils: Pupils are equal, round, and reactive to light.  Neck:     Thyroid: No thyromegaly.     Vascular: No JVD.     Trachea: No tracheal deviation.  Cardiovascular:     Rate and Rhythm: Normal rate and regular rhythm.     Heart sounds: Normal heart sounds. No murmur heard.  No friction rub. No gallop.   Pulmonary:     Effort: Pulmonary effort is normal. No respiratory distress.     Breath sounds: Normal breath sounds. No wheezing or rales.  Chest:     Chest wall: No tenderness.  Abdominal:     Palpations: Abdomen is soft.     Tenderness: There is no abdominal tenderness. There is no guarding.  Musculoskeletal:        General: Normal range of motion.     Cervical back: Normal range of motion and neck supple.  Lymphadenopathy:     Cervical: No  cervical adenopathy.  Skin:    General: Skin is warm and dry.  Neurological:     Mental Status: He is alert and oriented to person, place, and time.     Cranial Nerves: No cranial nerve deficit.  Psychiatric:        Behavior: Behavior normal.        Thought Content: Thought content normal.        Judgment: Judgment normal.     Assessment/Plan: 1. Headache above the eye region Use fioricet for one week as discussed, and follow up in office.  Will reexamine and consider head Ct at that time. Instructed patient on signs and symptoms that he should immediately go to ER for. He verbalized understanding.  - butalbital-acetaminophen-caffeine (FIORICET) 50-325-40 MG tablet; Take 1-2 tablets by mouth every 6 (six)  hours as needed for headache.  Dispense: 10 tablet; Refill: 0  2. Loud snoring Will discussed sleep study at next visit.   3. Complaints of memory disturbance Ongoing symptoms, had carotid endarectomy in march 2021, consider CT head at one week follow up.   General Counseling: Mickael verbalizes understanding of the findings of todays visit and agrees with plan of treatment. I have discussed any further diagnostic evaluation that may be needed or ordered today. We also reviewed his medications today. he has been encouraged to call the office with any questions or concerns that should arise related to todays visit.   No orders of the defined types were placed in this encounter.   Meds ordered this encounter  Medications  . butalbital-acetaminophen-caffeine (FIORICET) 50-325-40 MG tablet    Sig: Take 1-2 tablets by mouth every 6 (six) hours as needed for headache.    Dispense:  10 tablet    Refill:  0    Time spent:30 Minutes  This patient was seen by Kendell Bane  AGNP-C in Collaboration with Dr Lavera Guise as a part of collaborative care agreement.  Kendell Bane AGNP-C Internal Medicine

## 2020-09-07 ENCOUNTER — Other Ambulatory Visit: Payer: Self-pay | Admitting: Adult Health

## 2020-09-07 ENCOUNTER — Other Ambulatory Visit: Payer: Self-pay

## 2020-09-07 ENCOUNTER — Encounter (INDEPENDENT_AMBULATORY_CARE_PROVIDER_SITE_OTHER): Payer: Self-pay | Admitting: Vascular Surgery

## 2020-09-07 ENCOUNTER — Ambulatory Visit (INDEPENDENT_AMBULATORY_CARE_PROVIDER_SITE_OTHER): Payer: BC Managed Care – PPO

## 2020-09-07 ENCOUNTER — Ambulatory Visit (INDEPENDENT_AMBULATORY_CARE_PROVIDER_SITE_OTHER): Payer: BC Managed Care – PPO | Admitting: Vascular Surgery

## 2020-09-07 VITALS — BP 104/70 | HR 57 | Resp 16 | Wt 176.8 lb

## 2020-09-07 DIAGNOSIS — I6521 Occlusion and stenosis of right carotid artery: Secondary | ICD-10-CM

## 2020-09-07 DIAGNOSIS — I10 Essential (primary) hypertension: Secondary | ICD-10-CM | POA: Diagnosis not present

## 2020-09-07 DIAGNOSIS — T7840XD Allergy, unspecified, subsequent encounter: Secondary | ICD-10-CM

## 2020-09-07 NOTE — Assessment & Plan Note (Signed)
blood pressure control important in reducing the progression of atherosclerotic disease. On appropriate oral medications.  

## 2020-09-07 NOTE — Progress Notes (Signed)
MRN : [id number]  Nicholas Torres is a 59 y.o. (May 04, 1961) male who presents with chief complaint of  Chief Complaint  Patient presents with  . Follow-up    55month ultrasound follow up  .  History of Present Illness: Patient returns in follow-up of his carotid disease.  He underwent right carotid endarterectomy earlier this year for high-grade stenosis.  He is doing well.  He has resumed all activities and has no complaints today.  Carotid duplex shows a widely patent right carotid endarterectomy with minimal left carotid stenosis on the lower end of the 1 to 39% range.  Current Outpatient Medications  Medication Sig Dispense Refill  . ASPIRIN LOW DOSE 81 MG EC tablet Take 81 mg by mouth daily.    . butalbital-acetaminophen-caffeine (FIORICET) 50-325-40 MG tablet Take 1-2 tablets by mouth every 6 (six) hours as needed for headache. 10 tablet 0  . fluticasone (FLONASE) 50 MCG/ACT nasal spray PLACE 1 SPRAY INTO BOTH NOSTRILS AS NEEDED. 48 mL 1  . lisinopril-hydrochlorothiazide (PRINZIDE,ZESTORETIC) 10-12.5 MG tablet Take 1 tablet by mouth daily. (Patient taking differently: Take 1 tablet by mouth every morning. ) 90 tablet 0  . metoprolol tartrate (LOPRESSOR) 25 MG tablet Take 0.5 tablets (12.5 mg total) by mouth daily. 90 tablet 2  . REPATHA SURECLICK 956 MG/ML SOAJ SMARTSIG:1 Injection SUB-Q Every 2 Weeks    . rosuvastatin (CRESTOR) 40 MG tablet Take 40 mg by mouth every morning.     . clopidogrel (PLAVIX) 75 MG tablet TAKE 1 TABLET BY MOUTH DAILY AT 6 AM. (Patient not taking: Reported on 09/07/2020) 90 tablet 0   No current facility-administered medications for this visit.    Past Medical History:  Diagnosis Date  . Arthritis    back  . CAD (coronary artery disease) 2017  . CHF (congestive heart failure) ([place])   . High cholesterol   . Hypertension   . Myocardial infarction ([place]) 2017  . Pollen allergy     Past Surgical History:  Procedure Laterality Date  . CARDIAC  CATHETERIZATION Right 07/24/2016   Procedure: Left Heart Cath and Coronary Angiography;  Surgeon: Dionisio David, MD;  Location: [place] [facility] [facility];  Service: Cardiovascular;  Laterality: Right;  . CORONARY ARTERY BYPASS GRAFT N/A 07/26/2016   Procedure: CORONARY ARTERY BYPASS GRAFTING (CABG), ON PUMP, TIMES FOUR, USING BILATERAL INTERNAL MAMMARY ARTERIES, RIGHT GREATER SAPHENOUS VEIN HARVESTED ENDOSCOPICALLY;  Surgeon: Melrose Nakayama, MD;  Location: [place];  Service: Open Heart Surgery;  Laterality: N/A;  . ENDARTERECTOMY Right 01/22/2020   Procedure: ENDARTERECTOMY CAROTID;  Surgeon: Algernon Huxley, MD;  Location: [facility];  Service: Vascular;  Laterality: Right;  . INGUINAL HERNIA REPAIR Right 04/16/2020   Procedure: HERNIA REPAIR INGUINAL ADULT;  Surgeon: Fredirick Maudlin, MD;  Location: [facility];  Service: General;  Laterality: Right;  . RADIAL ARTERY HARVEST Left 07/26/2016   Procedure: RADIAL LEFT ARTERY HARVEST;  Surgeon: Melrose Nakayama, MD;  Location: [place];  Service: Open Heart Surgery;  Laterality: Left;  . TEE WITHOUT CARDIOVERSION N/A 07/26/2016   Procedure: TRANSESOPHAGEAL ECHOCARDIOGRAM (TEE);  Surgeon: Melrose Nakayama, MD;  Location: [place];  Service: Open Heart Surgery;  Laterality: N/A;     Social History   Tobacco Use  . Smoking status: Never Smoker  . Smokeless tobacco: Never Used  Vaping Use  . Vaping Use: Never used  Substance Use Topics  . Alcohol use: No  . Drug use: No      Family History  Problem Relation Age of Onset  . Diabetes Mother   . Heart attack Father   . CAD Brother      No Known Allergies   REVIEW OF SYSTEMS (Negative unless checked)  Constitutional: [] Weight loss  [] Fever  [] Chills Cardiac: [] Chest pain   [] Chest pressure   [] Palpitations   [] Shortness of breath when laying flat   [] Shortness of breath at rest   [] Shortness of breath with exertion. Vascular:  [] Pain in legs with walking   [] Pain in legs at rest   [] Pain in legs  when laying flat   [] Claudication   [] Pain in feet when walking  [] Pain in feet at rest  [] Pain in feet when laying flat   [] History of DVT   [] Phlebitis   [] Swelling in legs   [] Varicose veins   [] Non-healing ulcers Pulmonary:   [] Uses home oxygen   [] Productive cough   [] Hemoptysis   [] Wheeze  [] COPD   [] Asthma Neurologic:  [] Dizziness  [] Blackouts   [] Seizures   [] History of stroke   [] History of TIA  [] Aphasia   [] Temporary blindness   [] Dysphagia   [] Weakness or numbness in arms   [] Weakness or numbness in legs Musculoskeletal:  [] Arthritis   [] Joint swelling   [] Joint pain   [x] Low back pain Hematologic:  [] Easy bruising  [] Easy bleeding   [] Hypercoagulable state   [] Anemic  [] Hepatitis Gastrointestinal:  [] Blood in stool   [] Vomiting blood  [] Gastroesophageal reflux/heartburn   [] Difficulty swallowing. Genitourinary:  [] Chronic kidney disease   [] Difficult urination  [] Frequent urination  [] Burning with urination   [] Blood in urine Skin:  [] Rashes   [] Ulcers   [] Wounds Psychological:  [] History of anxiety   []  History of major depression.  Physical Examination  Vitals:   09/07/20 1114  BP: 104/70  Pulse: (!) 57  Resp: 16  Weight: 176 lb 12.8 oz (80.2 kg)   Body mass index is 29.42 kg/m. Gen:  WD/WN, NAD Head: [place]/AT, No temporalis wasting. Ear/Nose/Throat: Hearing grossly intact, nares w/o erythema or drainage, trachea midline Eyes: Conjunctiva clear. Sclera non-icteric Neck: Supple.  No bruit  Pulmonary:  Good air movement, equal and clear to auscultation bilaterally.  Cardiac: RRR, No JVD Vascular:  Vessel Right Left  Radial Palpable Palpable               Musculoskeletal: M/S 5/5 throughout.  No deformity or atrophy. No edema. Neurologic: CN 2-12 intact. Sensation grossly intact in extremities.  Symmetrical.  Speech is fluent. Motor exam as listed above. Psychiatric: Judgment intact, Mood & affect appropriate for pt's clinical situation. Dermatologic: No rashes or  ulcers noted.  Well healed right CEA although the scar is a bit thick     CBC Lab Results  Component Value Date   WBC 4.8 04/14/2020   HGB 14.6 04/16/2020   HCT 43.0 04/16/2020   MCV 91.5 04/14/2020   PLT 148 (L) 04/14/2020    BMET    Component Value Date/Time   NA 144 04/16/2020 0809   K 3.3 (L) 04/16/2020 0809   CL 104 04/16/2020 0809   CO2 23 01/23/2020 0557   GLUCOSE 88 04/16/2020 0809   BUN 11 04/16/2020 0809   CREATININE 1.20 04/16/2020 0809   CREATININE 1.17 07/10/2013 0948   CALCIUM 8.3 (L) 01/23/2020 0557   GFRNONAA >60 01/23/2020 0557   GFRAA >60 01/23/2020 0557   CrCl cannot be calculated (Patient's most recent lab result is older than the maximum 21 days allowed.).  COAG Lab Results  Component Value Date   INR 1.1 01/20/2020   INR 1.18 08/11/2016   INR 1.55 07/26/2016    Radiology No results found.   Assessment/Plan Carotid stenosis, right Carotid duplex shows a widely patent right carotid endarterectomy with minimal left carotid stenosis on the lower end of the 1 to 39% range.  Continue aspirin and Crestor.  He has stopped Plavix which is okay at this point.  Check annually going forward.  Essential hypertension blood pressure control important in reducing the progression of atherosclerotic disease. On appropriate oral medications.     Leotis Pain, MD  09/07/2020 11:58 AM    This note was created with Dragon medical transcription system.  Any errors from dictation are purely unintentional

## 2020-09-07 NOTE — Assessment & Plan Note (Signed)
Carotid duplex shows a widely patent right carotid endarterectomy with minimal left carotid stenosis on the lower end of the 1 to 39% range.  Continue aspirin and Crestor.  He has stopped Plavix which is okay at this point.  Check annually going forward.

## 2020-09-08 ENCOUNTER — Encounter: Payer: Self-pay | Admitting: Internal Medicine

## 2020-09-08 ENCOUNTER — Ambulatory Visit: Payer: PRIVATE HEALTH INSURANCE | Admitting: Internal Medicine

## 2020-09-08 DIAGNOSIS — G479 Sleep disorder, unspecified: Secondary | ICD-10-CM

## 2020-09-08 DIAGNOSIS — M5020 Other cervical disc displacement, unspecified cervical region: Secondary | ICD-10-CM

## 2020-09-08 DIAGNOSIS — G4486 Cervicogenic headache: Secondary | ICD-10-CM | POA: Diagnosis not present

## 2020-09-08 DIAGNOSIS — I10 Essential (primary) hypertension: Secondary | ICD-10-CM | POA: Diagnosis not present

## 2020-09-08 DIAGNOSIS — M5030 Other cervical disc degeneration, unspecified cervical region: Secondary | ICD-10-CM

## 2020-09-08 MED ORDER — MELOXICAM 15 MG PO TABS: ORAL_TABLET | ORAL | 0 refills | Status: DC

## 2020-09-08 MED ORDER — CYCLOBENZAPRINE HCL 10 MG PO TABS: ORAL_TABLET | ORAL | 0 refills | Status: DC

## 2020-09-08 NOTE — Progress Notes (Signed)
[facility], [place] [address]  Internal MEDICINE  Office Visit Note  Patient Name: Nicholas Torres  [id number]  [id number]  Date of Service: 09/13/2020  Chief Complaint  Patient presents with  . Follow-up    1 week headaches    HPI  Pt is here with c/o ongoing headaches.he has been treated with fiorcet with no relief. Pt has h/o CAD s/p CABG, recent carotid endarterectomy on the right, followed by vascular. He is not sure if he is on Plavix, will bring his medications on next visit. Blood pressure is under reasonable control. On review of his C-spine  Disc levels: Mild degenerative disease with disc height loss at C3-4, C4-5, C5-6 and C6-7. Broad-based disc bulge with a small central disc protrusion at C3-4. Broad-based disc bulge with a small central disc protrusion at C4-5. Moderate left foraminal stenosis at C4-5. Broad-based disc bulge Radiating pain from neck to scalp, pt also has problem maintaining his sleep, waking up with headaches  Reports fatigue during the day as well  Current Medication: Outpatient Encounter Medications as of 09/08/2020  Medication Sig  . ASPIRIN LOW DOSE 81 MG EC tablet Take 81 mg by mouth daily.  . butalbital-acetaminophen-caffeine (FIORICET) 50-325-40 MG tablet Take 1-2 tablets by mouth every 6 (six) hours as needed for headache.  . clopidogrel (PLAVIX) 75 MG tablet TAKE 1 TABLET BY MOUTH DAILY AT 6 AM.  . fluticasone (FLONASE) 50 MCG/ACT nasal spray PLACE 1 SPRAY INTO BOTH NOSTRILS AS NEEDED.  Marland Kitchen lisinopril-hydrochlorothiazide (PRINZIDE,ZESTORETIC) 10-12.5 MG tablet Take 1 tablet by mouth daily. (Patient taking differently: Take 1 tablet by mouth every morning. )  . metoprolol tartrate (LOPRESSOR) 25 MG tablet Take 0.5 tablets (12.5 mg total) by mouth daily.  Marland Kitchen REPATHA SURECLICK 355 MG/ML SOAJ SMARTSIG:1 Injection SUB-Q Every 2 Weeks  . rosuvastatin (CRESTOR) 40 MG tablet Take 40 mg by mouth every morning.   .  cyclobenzaprine (FLEXERIL) 10 MG tablet Take one tab po qhs for neck spasm  . meloxicam (MOBIC) 15 MG tablet Take one tab po qd for inflammation of neck   No facility-administered encounter medications on file as of 09/08/2020.    Surgical History: Past Surgical History:  Procedure Laterality Date  . CARDIAC CATHETERIZATION Right 07/24/2016   Procedure: Left Heart Cath and Coronary Angiography;  Surgeon: Dionisio David, MD;  Location: [place] [facility];  Service: Cardiovascular;  Laterality: Right;  . CORONARY ARTERY BYPASS GRAFT N/A 07/26/2016   Procedure: CORONARY ARTERY BYPASS GRAFTING (CABG), ON PUMP, TIMES FOUR, USING BILATERAL INTERNAL MAMMARY ARTERIES, RIGHT GREATER SAPHENOUS VEIN HARVESTED ENDOSCOPICALLY;  Surgeon: Melrose Nakayama, MD;  Location: [place];  Service: Open Heart Surgery;  Laterality: N/A;  . ENDARTERECTOMY Right 01/22/2020   Procedure: ENDARTERECTOMY CAROTID;  Surgeon: Algernon Huxley, MD;  Location: [facility];  Service: Vascular;  Laterality: Right;  . INGUINAL HERNIA REPAIR Right 04/16/2020   Procedure: HERNIA REPAIR INGUINAL ADULT;  Surgeon: Fredirick Maudlin, MD;  Location: [facility];  Service: General;  Laterality: Right;  . RADIAL ARTERY HARVEST Left 07/26/2016   Procedure: RADIAL LEFT ARTERY HARVEST;  Surgeon: Melrose Nakayama, MD;  Location: [place];  Service: Open Heart Surgery;  Laterality: Left;  . TEE WITHOUT CARDIOVERSION N/A 07/26/2016   Procedure: TRANSESOPHAGEAL ECHOCARDIOGRAM (TEE);  Surgeon: Melrose Nakayama, MD;  Location: [place];  Service: Open Heart Surgery;  Laterality: N/A;    Medical History: Past Medical History:  Diagnosis Date  . Arthritis  back  . CAD (coronary artery disease) 2017  . CHF (congestive heart failure) ([place])   . High cholesterol   . Hypertension   . Myocardial infarction ([place]) 2017  . Pollen allergy     Family History: Family History  Problem Relation Age of Onset  . Diabetes Mother   . Heart attack Father   . CAD  Brother     Social History   Socioeconomic History  . Marital status: Married    Spouse name: Not on file  . Number of children: Not on file  . Years of education: Not on file  . Highest education level: Not on file  Occupational History  . Not on file  Tobacco Use  . Smoking status: Never Smoker  . Smokeless tobacco: Never Used  Vaping Use  . Vaping Use: Never used  Substance and Sexual Activity  . Alcohol use: No  . Drug use: No  . Sexual activity: Yes  Other Topics Concern  . Not on file  Social History Narrative  . Not on file   Social Determinants of Health   Financial Resource Strain:   . Difficulty of Paying Living Expenses: Not on file  Food Insecurity:   . Worried About Charity fundraiser in the Last Year: Not on file  . Ran Out of Food in the Last Year: Not on file  Transportation Needs:   . Lack of Transportation (Medical): Not on file  . Lack of Transportation (Non-Medical): Not on file  Physical Activity:   . Days of Exercise per Week: Not on file  . Minutes of Exercise per Session: Not on file  Stress:   . Feeling of Stress : Not on file  Social Connections:   . Frequency of Communication with Friends and Family: Not on file  . Frequency of Social Gatherings with Friends and Family: Not on file  . Attends Religious Services: Not on file  . Active Member of Clubs or Organizations: Not on file  . Attends Archivist Meetings: Not on file  . Marital Status: Not on file  Intimate Partner Violence:   . Fear of Current or Ex-Partner: Not on file  . Emotionally Abused: Not on file  . Physically Abused: Not on file  . Sexually Abused: Not on file    Review of Systems  Constitutional: Negative for chills, fatigue and unexpected weight change.  HENT: Negative for congestion, postnasal drip, rhinorrhea, sneezing and sore throat.   Eyes: Negative for redness and visual disturbance.  Respiratory: Negative for cough, chest tightness and shortness  of breath.   Cardiovascular: Negative for chest pain and palpitations.  Gastrointestinal: Negative for abdominal pain, constipation, diarrhea, nausea and vomiting.  Genitourinary: Negative for dysuria and frequency.  Musculoskeletal: Positive for joint swelling. Negative for arthralgias, back pain and neck pain.       Neck pain   Skin: Negative for rash.  Neurological: Positive for headaches. Negative for tremors, facial asymmetry and numbness.  Hematological: Negative for adenopathy. Does not bruise/bleed easily.  Psychiatric/Behavioral: Positive for sleep disturbance. Negative for behavioral problems (Depression) and suicidal ideas. The patient is not nervous/anxious.     Vital Signs: BP 118/88   Pulse 65   Temp 98.7 F (37.1 C)   Resp 16   Ht 5\' 5"  (1.651 m)   Wt 178 lb (80.7 kg)   SpO2 99%   BMI 29.62 kg/m    Physical Exam Constitutional:      General: He is not  in acute distress.    Appearance: He is well-developed. He is not diaphoretic.  HENT:     Head: Normocephalic and atraumatic.     Mouth/Throat:     Pharynx: No oropharyngeal exudate.  Eyes:     Pupils: Pupils are equal, round, and reactive to light.  Neck:     Thyroid: No thyromegaly.     Vascular: No JVD.     Trachea: No tracheal deviation.     Comments: decreased rom  Cardiovascular:     Rate and Rhythm: Normal rate and regular rhythm.     Heart sounds: Normal heart sounds. No murmur heard.  No friction rub. No gallop.   Pulmonary:     Effort: Pulmonary effort is normal. No respiratory distress.     Breath sounds: No wheezing or rales.  Chest:     Chest wall: No tenderness.  Abdominal:     General: Bowel sounds are normal.     Palpations: Abdomen is soft.  Musculoskeletal:        General: Normal range of motion.     Cervical back: Rigidity and tenderness present.  Lymphadenopathy:     Cervical: No cervical adenopathy.  Skin:    General: Skin is warm and dry.  Neurological:     Mental Status:  He is alert and oriented to person, place, and time.     Cranial Nerves: No cranial nerve deficit.  Psychiatric:        Behavior: Behavior normal.        Thought Content: Thought content normal.        Judgment: Judgment normal.     Assessment/Plan: 1. 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 Cervicogenic headache Will dc Fioricet, not a good choice of medicine with CAD  - meloxicam (MOBIC) 15 MG tablet; Take one tab po qd for inflammation of neck  Dispense: 10 tablet; only as needed   3. Essential hypertension, benign Monitor Blood pressure, better blood pressure control will improve headaches   4. Sleep disturbances Patient has sign and symptoms of OSA ( disturbed sleep, excessive fatigue during the day, uncontrolled headaches  and abnormal BMI). Baseline sleep study is ordered to further look into this. Long term complications of OSA was addressed with the patient. - PSG Sleep Study; Future  General Counseling: Marte verbalizes understanding of the findings of todays visit and agrees with plan of treatment. I have discussed any further diagnostic evaluation that may be needed or ordered today. We also reviewed his medications today. he has been encouraged to call the office with any questions or concerns that should arise related to todays visit.    Orders Placed This Encounter  Procedures  . PSG Sleep Study    Meds ordered this encounter  Medications  . cyclobenzaprine (FLEXERIL) 10 MG tablet    Sig: Take one tab po qhs for neck spasm    Dispense:  30 tablet    Refill:  0  . meloxicam (MOBIC) 15 MG tablet    Sig: Take one tab po qd for inflammation of neck    Dispense:  10 tablet    Refill:  0   Total time spent:35 Minutes Time spent includes review of chart,  medications, test results, and follow up plan with the patient.  Dr Lavera Guise Internal medicine

## 2020-09-09 ENCOUNTER — Telehealth: Payer: Self-pay

## 2020-09-09 NOTE — Telephone Encounter (Signed)
Gave [facility] orders for sleep study. Nicholas Torres

## 2020-09-28 ENCOUNTER — Other Ambulatory Visit (INDEPENDENT_AMBULATORY_CARE_PROVIDER_SITE_OTHER): Payer: Self-pay | Admitting: Vascular Surgery

## 2020-09-28 NOTE — Telephone Encounter (Signed)
Dr. Bunnie Domino last note said that the pt has stopped plavix. Is this ok to refill?

## 2020-10-05 ENCOUNTER — Encounter (INDEPENDENT_AMBULATORY_CARE_PROVIDER_SITE_OTHER): Payer: BC Managed Care – PPO | Admitting: Internal Medicine

## 2020-10-05 DIAGNOSIS — G4733 Obstructive sleep apnea (adult) (pediatric): Secondary | ICD-10-CM | POA: Diagnosis not present

## 2020-10-09 NOTE — Procedures (Signed)
[facility] Part I                                                               Phone: [phone] Fax: [phone]  Patient Name: Nicholas Torres, Nicholas Torres Acquisition Number: [id number]  Date of Birth: [birth year] Acquisition Date: 10/05/2020  Referring Physician: Clayborn Bigness, MD     History: The patient is a 59 year old male who was referred for evaluation of possible sleep apnea. Medical History: arthritis, CAD, CHF, high cholesterol, hypertension, myocardial infarction, pollen allergy.  Medications: low dose aspirin, Fioricet, Plavix, Flonase, lisinopril-hydrochlorothiazide, Lopressor, Crestor, Flexeril, Mobic.  Procedure: This routine overnight polysomnogram was performed on the [facility] 5 using the standard diagnostic protocol. This included 6 channels of EEG, 2 channels of EOG, chin EMG, bilateral anterior tibialis EMG, nasal/oral thermistor, PTAF (nasal pressure transducer), chest and abdominal wall movements, EKG, and pulse oximetry.  Description: The total recording time was 428.0 minutes. The total sleep time was 320.5 minutes. There were a total of 90.3 minutes of wakefulness after sleep onset for a reduced?sleep efficiency of 74.9%. The latency to sleep onset was within normal limits??at 17.2 minutes. The R sleep onset latency was within normal limits at 75.5 minutes.?? Sleep parameters, as a percentage of the total sleep time, demonstrated 4.1% of sleep was in N1 sleep, 76.0% N2, 0.9% N3 and 19.0% R sleep. There were a total of 48 arousals for an arousal index of 9.0 arousals per hour of sleep that was normal.???  Respiratory monitoring demonstrated continuous mild degree of snoring on the back. There were 31 apneas and hypopneas for an Apnea Hypopnea Index of 5.8 apneas and hypopneas per hour of sleep. The REM related apnea hypopnea index was 7.9/hr of REM sleep compared to a NREM AHI of 5.3/hr.  The average duration of the respiratory events was 32.9  seconds with a maximum duration of 50.0 seconds. The respiratory events occurred mostly in the supine position with an AHI of 10.5. The respiratory events were associated with peripheral oxygen desaturations on the average to 93%. The lowest oxygen desaturation associated with a respiratory event was 89%. Additionally, the baseline oxygen saturation during wakefulness was 98%, during NREM sleep averaged 97%, and during REM sleep averaged 97%. The total duration of oxygen < 90% was 0.1 minutes.  Cardiac monitoring- did not demonstrate transient cardiac decelerations associated with the apneas. There were no significant cardiac rhythm irregularities.   Periodic limb movement monitoring- demonstrated that there were 70 periodic limb movements for a periodic limb movement index of 13.1 periodic limb movements per hour of sleep. Quasi-periodic limb movements were observed during periods of wakefulness.  Impression: ???This routine overnight polysomnogram demonstrated significant obstructive sleep apnea with an overall Apnea Hypopnea Index of 5.8 apneas and hypopneas per hour of sleep. ?The respiratory events were more frequent in the supine position, particularly during supine, REM sleep.    There was a slightly elevated periodic limb movement index of 13.1 periodic limb movements per hour of sleep. In addition, quasi-periodic limb movements were observed during periods of wakefulness. Sometimes these limb movements subside once the apnea is controlled. Clinical correlation is suggested.  There was a ?reduced sleep efficiency with??increased awakenings?and a reduced percentage of slow wave sleep. These  findings would appear to be due to the combination of obstructive sleep apnea, periodic limb movements and psychophysiological factors such as first night effect and the patient's medical history.   ?Recommendations:    1. This sleep study is consistent with MILD sleep apnea with an overall AHI of  5.8 per  hour. 2. A CPAP titration would be recommended for the sleep apnea. Some supine sleep should be ensured to optimize the titration. 3. Desaturation with respiratory events was noted with a nadir of 89% 4. Would recommend weight loss in a patient with a BMI of 29.6.    Allyne Gee, MD, [facility] Diplomate ABMS Pulmonary, Critical Care and Sleep Medicine  Electronically reviewed and digitally signed       [facility] Report Part II  Phone: [phone] Fax: [phone]  Patient last name Nicholas Torres Neck Size 15.0 in. Acquisition [phone]  Patient first name Nicholas Torres Weight 178.0 lbs. Started 10/05/2020 at 11:07:44 PM  Birth date May 03, 1961 Height 65.0 in. Stopped 10/06/2020 at 6:29:38 AM  Age 20 BMI 29.6 lb/in2 Duration 428.0  Study Type Adult      Report generated by: Herschel Senegal. Wadie Lessen, RPSGT Sleep Data: Lights Out: 11:19:32 PM Sleep Onset: 11:36:44 PM  Lights On: 6:27:32 AM Sleep Efficiency: 74.9 %  Total Recording Time: 428.0 min Sleep Latency (from Lights Off) 17.2 min  Total Sleep Time (TST): 320.5 min R Latency (from Sleep Onset): 75.5 min  Sleep Period Time: 360.5 min Total number of awakenings: 26  Wake during sleep: 40.0 min Wake After Sleep Onset (WASO): 90.3 min   Sleep Data:         Arousal Summary: Stage  Latency from lights out (min) Latency from sleep onset (min) Duration (min) % Total Sleep Time  Normal values  N 1 57.2 40.0 13.0 4.1 (5%)  N 2 17.2 0.0 243.5 76.0 (50%)  N 3 29.2 12.0 3.0 0.9 (20%)  R 92.7 75.5 61.0 19.0 (25%)    Number Index  Spontaneous 31 5.8  Apneas & Hypopneas 8 1.5  RERAs 0 0.0       (Apneas & Hypopneas & RERAs)  (8) (1.5)  Limb Movement 21 3.9  Snore 0 0.0  TOTAL 60 11.2      Respiratory Data:  CA OA MA Apnea Hypopnea* A+ H RERA Total  Number 0 0 2 2 29 31  0 31  Mean Dur (sec) 0.0 0.0 21.8 21.8 33.7 32.9 0.0 32.9  Max Dur (sec) 0.0 0.0 24.0 24.0 50.0 50.0 0.0 50.0  Total Dur (min) 0.0 0.0 0.7 0.7  16.3 17.0 0.0 17.0  % of TST 0.0 0.0 0.2 0.2 5.1 5.3 0.0 5.3  Index (#/h TST) 0.0 0.0 0.4 0.4 5.4 5.8 0.0 5.8  *Hypopneas scored based on 4% or greater desaturation.  Sleep Stage:        REM NREM TST  AHI 7.9 5.3 5.8  RDI 7.9 5.3 5.8            Body Position Data:  Sleep (min) TST (%) REM (min) NREM (min) CA (#) OA (#) MA (#) HYP (#) AHI (#/h) RERA (#) RDI (#/h) Desat (#)  Supine 101.9 31.79 22.3 79.6 0 0 2 16 10.6 0 10.6 22  Non-Supine 218.60 68.21 38.70 179.90 0.00 0.00 0.00 13.00 3.57 0 3.57 25.00  Left: 218.6 68.21 38.7 179.9 0 0 0 13 3.6 0 3.6 25     Snoring: Total number of snoring episodes  0  Total time with snoring    min (   % of sleep)   Oximetry Distribution:             WK REM NREM TOTAL  Average (%)   98 97 97 97  < 90% 0.0 0.0 0.1 0.1  < 80% 0.0 0.0 0.0 0.0  < 70% 0.0 0.0 0.0 0.0  # of Desaturations* 7 8 30  45  Desat Index (#/hour) 4.1 7.9 6.9 8.4  Desat Max (%) 10 8 8 10   Desat Max Dur (sec) 107.0 42.0 78.0 107.0  Approx Min O2 during sleep 89  Approx min O2 during a respiratory event 89  Was Oxygen added (Y/N) and final rate No:   0 LPM  *Desaturations based on 4% or greater drop from baseline.   Cheyne Stokes Breathing: None Present   Heart Rate Summary:  Average Heart Rate During Sleep 47.8 bpm      Highest Heart Rate During Sleep (95th %) 57.0 bpm      Highest Heart Rate During Sleep 159 bpm (artifact)  Highest Heart Rate During Recording (TIB) 213 bpm (artifact)   Heart Rate Observations: Event Type # Events   Bradycardia 0 Lowest HR Scored: N/A  Sinus Tachycardia During Sleep 0 Highest HR Scored: N/A  Narrow Complex Tachycardia 0 Highest HR Scored: N/A  Wide Complex Tachycardia 0 Highest HR Scored: N/A  Asystole 0 Longest Pause: N/A  Atrial Fibrillation 0 Duration Longest Event: N/A  Other Arrythmias  No Type:    Periodic Limb Movement Data: (Primary legs unless otherwise noted) Total # Limb Movement 74 Limb Movement  Index 13.9  Total # PLMS 70 PLMS Index 13.1  Total # PLMS Arousals 19 PLMS Arousal Index 3.6  Percentage Sleep Time with PLMS 26.68min (8.1 % sleep)  Mean Duration limb movements (secs) 155.8

## 2020-10-19 ENCOUNTER — Ambulatory Visit: Payer: PRIVATE HEALTH INSURANCE | Admitting: Internal Medicine

## 2020-10-19 ENCOUNTER — Encounter: Payer: Self-pay | Admitting: Internal Medicine

## 2020-10-19 ENCOUNTER — Other Ambulatory Visit: Payer: Self-pay

## 2020-10-19 VITALS — BP 103/75 | HR 75 | Temp 97.1°F | Resp 16 | Ht 65.0 in | Wt 184.6 lb

## 2020-10-19 DIAGNOSIS — I10 Essential (primary) hypertension: Secondary | ICD-10-CM

## 2020-10-19 DIAGNOSIS — G479 Sleep disorder, unspecified: Secondary | ICD-10-CM

## 2020-10-19 DIAGNOSIS — G4486 Cervicogenic headache: Secondary | ICD-10-CM | POA: Diagnosis not present

## 2020-10-19 DIAGNOSIS — I700 Atherosclerosis of aorta: Secondary | ICD-10-CM | POA: Diagnosis not present

## 2020-10-19 DIAGNOSIS — M5020 Other cervical disc displacement, unspecified cervical region: Secondary | ICD-10-CM

## 2020-10-19 DIAGNOSIS — M5030 Other cervical disc degeneration, unspecified cervical region: Secondary | ICD-10-CM

## 2020-10-19 DIAGNOSIS — H9313 Tinnitus, bilateral: Secondary | ICD-10-CM

## 2020-10-19 MED ORDER — CYCLOBENZAPRINE HCL 10 MG PO TABS: ORAL_TABLET | ORAL | 3 refills | Status: DC

## 2020-10-19 NOTE — Progress Notes (Signed)
[facility] [place], [place] [address]  Internal MEDICINE  Office Visit Note  Patient Name: Nicholas Torres  [id number]  [id number]  Date of Service: 10/19/2020  Chief Complaint  Patient presents with  . Follow-up    results for sleep apnea  . Hypertension  . Hyperlipidemia  . policy update form    received    HPI Pt is here for routine follow up Sleep study showed normal AHI index with no desaturations however quality of sleep is poor. He continues to have pain in his neck, headaches and lower back pain, he also has Tinnitus which is service related. He is unable to work due to job requirements to lift 65 lbs or so. He is being followed up by ortho Has CAD, carotid and aorta atherosclerosis   Flexeril did help him with neck pain  Current Medication: Outpatient Encounter Medications as of 10/19/2020  Medication Sig  . ASPIRIN LOW DOSE 81 MG EC tablet Take 81 mg by mouth daily.  . clopidogrel (PLAVIX) 75 MG tablet TAKE 1 TABLET BY MOUTH DAILY AT 6 AM.  . cyclobenzaprine (FLEXERIL) 10 MG tablet Take one tab po qhs for neck spasm  . fluticasone (FLONASE) 50 MCG/ACT nasal spray PLACE 1 SPRAY INTO BOTH NOSTRILS AS NEEDED.  Marland Kitchen lisinopril-hydrochlorothiazide (PRINZIDE,ZESTORETIC) 10-12.5 MG tablet Take 1 tablet by mouth daily. (Patient taking differently: Take 1 tablet by mouth every morning. )  . meloxicam (MOBIC) 15 MG tablet Take one tab po qd for inflammation of neck  . REPATHA SURECLICK 536 MG/ML SOAJ SMARTSIG:1 Injection SUB-Q Every 2 Weeks  . rosuvastatin (CRESTOR) 40 MG tablet Take 40 mg by mouth every morning.   . [DISCONTINUED] butalbital-acetaminophen-caffeine (FIORICET) 50-325-40 MG tablet Take 1-2 tablets by mouth every 6 (six) hours as needed for headache.  . [DISCONTINUED] cyclobenzaprine (FLEXERIL) 10 MG tablet Take one tab po qhs for neck spasm  . [DISCONTINUED] metoprolol tartrate (LOPRESSOR) 25 MG tablet Take 0.5 tablets (12.5 mg total) by mouth  daily.   No facility-administered encounter medications on file as of 10/19/2020.    Surgical History: Past Surgical History:  Procedure Laterality Date  . CARDIAC CATHETERIZATION Right 07/24/2016   Procedure: Left Heart Cath and Coronary Angiography;  Surgeon: Dionisio David, MD;  Location: [facility];  Service: Cardiovascular;  Laterality: Right;  . CORONARY ARTERY BYPASS GRAFT N/A 07/26/2016   Procedure: CORONARY ARTERY BYPASS GRAFTING (CABG), ON PUMP, TIMES FOUR, USING BILATERAL INTERNAL MAMMARY ARTERIES, RIGHT GREATER SAPHENOUS VEIN HARVESTED ENDOSCOPICALLY;  Surgeon: Melrose Nakayama, MD;  Location: [facility];  Service: Open Heart Surgery;  Laterality: N/A;  . ENDARTERECTOMY Right 01/22/2020   Procedure: ENDARTERECTOMY CAROTID;  Surgeon: Algernon Huxley, MD;  Location: [facility];  Service: Vascular;  Laterality: Right;  . INGUINAL HERNIA REPAIR Right 04/16/2020   Procedure: HERNIA REPAIR INGUINAL ADULT;  Surgeon: Fredirick Maudlin, MD;  Location: [facility];  Service: General;  Laterality: Right;  . RADIAL ARTERY HARVEST Left 07/26/2016   Procedure: RADIAL LEFT ARTERY HARVEST;  Surgeon: Melrose Nakayama, MD;  Location: [facility];  Service: Open Heart Surgery;  Laterality: Left;  . TEE WITHOUT CARDIOVERSION N/A 07/26/2016   Procedure: TRANSESOPHAGEAL ECHOCARDIOGRAM (TEE);  Surgeon: Melrose Nakayama, MD;  Location: [facility] [place];  Service: Open Heart Surgery;  Laterality: N/A;    Medical History: Past Medical History:  Diagnosis Date  . Arthritis    back  . CAD (coronary artery disease) 2017  . CHF (congestive heart failure) ([place])   .  High cholesterol   . Hypertension   . Myocardial infarction ([employer]) 2017  . Pollen allergy     Family History: Family History  Problem Relation Age of Onset  . Diabetes Mother   . Heart attack Father   . CAD Brother     Social History   Socioeconomic History  . Marital status: Married    Spouse name: Not on file  . Number of children: Not on  file  . Years of education: Not on file  . Highest education level: Not on file  Occupational History  . Not on file  Tobacco Use  . Smoking status: Never Smoker  . Smokeless tobacco: Never Used  Vaping Use  . Vaping Use: Never used  Substance and Sexual Activity  . Alcohol use: No  . Drug use: No  . Sexual activity: Yes  Other Topics Concern  . Not on file  Social History Narrative  . Not on file   Social Determinants of Health   Financial Resource Strain:   . Difficulty of Paying Living Expenses: Not on file  Food Insecurity:   . Worried About Charity fundraiser in the Last Year: Not on file  . Ran Out of Food in the Last Year: Not on file  Transportation Needs:   . Lack of Transportation (Medical): Not on file  . Lack of Transportation (Non-Medical): Not on file  Physical Activity:   . Days of Exercise per Week: Not on file  . Minutes of Exercise per Session: Not on file  Stress:   . Feeling of Stress : Not on file  Social Connections:   . Frequency of Communication with Friends and Family: Not on file  . Frequency of Social Gatherings with Friends and Family: Not on file  . Attends Religious Services: Not on file  . Active Member of Clubs or Organizations: Not on file  . Attends Archivist Meetings: Not on file  . Marital Status: Not on file  Intimate Partner Violence:   . Fear of Current or Ex-Partner: Not on file  . Emotionally Abused: Not on file  . Physically Abused: Not on file  . Sexually Abused: Not on file      Review of Systems  Constitutional: Negative for chills, fatigue and unexpected weight change.  HENT: Positive for tinnitus. Negative for congestion, postnasal drip, rhinorrhea, sneezing and sore throat.   Eyes: Negative for redness.  Respiratory: Negative for cough, chest tightness and shortness of breath.   Cardiovascular: Negative for chest pain and palpitations.  Gastrointestinal: Negative for abdominal pain, constipation,  diarrhea, nausea and vomiting.  Genitourinary: Negative for dysuria and frequency.  Musculoskeletal: Positive for arthralgias, back pain and neck pain. Negative for joint swelling.  Skin: Negative for rash.  Neurological: Positive for headaches. Negative for tremors and numbness.  Hematological: Negative for adenopathy. Does not bruise/bleed easily.  Psychiatric/Behavioral: Negative for behavioral problems (Depression), sleep disturbance and suicidal ideas. The patient is not nervous/anxious.     Vital Signs: BP 103/75   Pulse 75   Temp (!) 97.1 F (36.2 C)   Resp 16   Ht 5\' 5"  (1.651 m)   Wt 184 lb 9.6 oz (83.7 kg)   SpO2 99%   BMI 30.72 kg/m    Physical Exam Constitutional:      General: He is not in acute distress.    Appearance: He is well-developed. He is not diaphoretic.  HENT:     Head: Normocephalic and atraumatic.  Mouth/Throat:     Pharynx: No oropharyngeal exudate.  Eyes:     Pupils: Pupils are equal, round, and reactive to light.  Neck:     Thyroid: No thyromegaly.     Vascular: No JVD.     Trachea: No tracheal deviation.  Cardiovascular:     Rate and Rhythm: Normal rate and regular rhythm.     Heart sounds: Normal heart sounds. No murmur heard.  No friction rub. No gallop.   Pulmonary:     Effort: Pulmonary effort is normal. No respiratory distress.     Breath sounds: No wheezing or rales.  Chest:     Chest wall: No tenderness.  Abdominal:     General: Bowel sounds are normal.     Palpations: Abdomen is soft.  Musculoskeletal:        General: No deformity.     Cervical back: Normal range of motion. Rigidity present.     Comments: Decreased ROM   Lymphadenopathy:     Cervical: No cervical adenopathy.  Skin:    General: Skin is warm and dry.     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     Cranial Nerves: No cranial nerve deficit.   Psychiatric:        Behavior: Behavior normal.        Thought Content: Thought content normal.        Judgment: Judgment normal.      Assessment/Plan: 1. Atherosclerosis of aorta ([place]) Continue Lipitor and Repatha as before. Continue to take Plavix   2. Bulge of cervical disc without myelopathy Per Ortho. Pt does need to go to [place] and look into social work. functional ability is limited to work  - cyclobenzaprine (FLEXERIL) 10 MG tablet; Take one tab po qhs for neck spasm  Dispense: 30 tablet; Refill: 3  3. Cervicogenic headache Continue Flexeril as before   4. Sleep disturbances Sleep is affected by pain   5. Essential hypertension Controlled however on the low side, will DC metoprolol   6. Tinnitus of both ears Sleep is disturbed by Tinnitus, will need to see ENT at [facility]: Lenoria Farrier understanding of the findings of todays visit and agrees with plan of treatment. I have discussed any further diagnostic evaluation that may be needed or ordered today. We also reviewed his medications today. he has been encouraged to call the office with any questions or concerns that should arise related to todays visit.    Meds ordered this encounter  Medications  . cyclobenzaprine (FLEXERIL) 10 MG tablet    Sig: Take one tab po qhs for neck spasm    Dispense:  30 tablet    Refill:  3    Total time spent:35 Minutes Time spent includes review of chart, medications, test results, and follow up plan with the patient.      Dr Lavera Guise Internal medicine

## 2020-11-08 ENCOUNTER — Ambulatory Visit (INDEPENDENT_AMBULATORY_CARE_PROVIDER_SITE_OTHER): Payer: BC Managed Care – PPO | Admitting: Hospice and Palliative Medicine

## 2020-11-08 ENCOUNTER — Other Ambulatory Visit: Payer: Self-pay

## 2020-11-08 VITALS — BP 126/78 | HR 83 | Temp 97.9°F | Wt 183.0 lb

## 2020-11-08 DIAGNOSIS — Z125 Encounter for screening for malignant neoplasm of prostate: Secondary | ICD-10-CM

## 2020-11-08 DIAGNOSIS — G4486 Cervicogenic headache: Secondary | ICD-10-CM

## 2020-11-08 DIAGNOSIS — R300 Dysuria: Secondary | ICD-10-CM

## 2020-11-08 DIAGNOSIS — I10 Essential (primary) hypertension: Secondary | ICD-10-CM

## 2020-11-08 DIAGNOSIS — Z0001 Encounter for general adult medical examination with abnormal findings: Secondary | ICD-10-CM

## 2020-11-08 DIAGNOSIS — G479 Sleep disorder, unspecified: Secondary | ICD-10-CM | POA: Diagnosis not present

## 2020-11-08 DIAGNOSIS — I2510 Atherosclerotic heart disease of native coronary artery without angina pectoris: Secondary | ICD-10-CM

## 2020-11-08 MED ORDER — LISINOPRIL-HYDROCHLOROTHIAZIDE 10-12.5 MG PO TABS: 1.0000 | ORAL_TABLET | ORAL | 1 refills | Status: DC

## 2020-11-08 MED ORDER — ROSUVASTATIN CALCIUM 40 MG PO TABS: 40.0000 mg | ORAL_TABLET | ORAL | 1 refills | Status: DC
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
## 2020-11-09 LAB — MICROSCOPIC EXAMINATION
Bacteria, UA: NONE SEEN
Casts: NONE SEEN /lpf
Epithelial Cells (non renal): NONE SEEN /hpf (ref 0–10)
RBC, Urine: NONE SEEN /hpf (ref 0–2)
WBC, UA: NONE SEEN /hpf (ref 0–5)

## 2020-11-09 LAB — UA/M W/RFLX CULTURE, ROUTINE
Bilirubin, UA: NEGATIVE
Glucose, UA: NEGATIVE
Ketones, UA: NEGATIVE
Leukocytes,UA: NEGATIVE
Nitrite, UA: NEGATIVE
Protein,UA: NEGATIVE
RBC, UA: NEGATIVE
Specific Gravity, UA: 1.024 (ref 1.005–1.030)
Urobilinogen, Ur: 0.2 mg/dL (ref 0.2–1.0)
pH, UA: 5.5 (ref 5.0–7.5)

## 2020-11-11 ENCOUNTER — Other Ambulatory Visit: Payer: Self-pay | Admitting: Hospice and Palliative Medicine

## 2020-11-12 LAB — COMPLETE METABOLIC PANEL WITH GFR
AG Ratio: 1.6 (calc) (ref 1.0–2.5)
ALT: 17 U/L (ref 9–46)
AST: 23 U/L (ref 10–35)
Albumin: 4.4 g/dL (ref 3.6–5.1)
Alkaline phosphatase (APISO): 64 U/L (ref 35–144)
BUN: 18 mg/dL (ref 7–25)
CO2: 31 mmol/L (ref 20–32)
Calcium: 9.3 mg/dL (ref 8.6–10.3)
Chloride: 104 mmol/L (ref 98–110)
Creat: 1.24 mg/dL (ref 0.70–1.33)
GFR, Est African American: 74 mL/min/{1.73_m2} (ref >=60)
GFR, Est Non African American: 64 mL/min/{1.73_m2} (ref >=60)
Globulin: 2.8 g/dL (calc) (ref 1.9–3.7)
Glucose, Bld: 86 mg/dL (ref 65–99)
Potassium: 3.2 mmol/L — ABNORMAL LOW (ref 3.5–5.3)
Sodium: 141 mmol/L (ref 135–146)
Total Bilirubin: 0.3 mg/dL (ref 0.2–1.2)
Total Protein: 7.2 g/dL (ref 6.1–8.1)

## 2020-11-12 LAB — CBC WITH DIFFERENTIAL/PLATELET
Absolute Monocytes: 528 cells/uL (ref 200–950)
Basophils Absolute: 10 cells/uL (ref 0–200)
Basophils Relative: 0.2 %
Eosinophils Absolute: 130 cells/uL (ref 15–500)
Eosinophils Relative: 2.7 %
HCT: 42.2 % (ref 38.5–50.0)
Hemoglobin: 14.2 g/dL (ref 13.2–17.1)
Lymphs Abs: 2717 cells/uL (ref 850–3900)
MCH: 31.6 pg (ref 27.0–33.0)
MCHC: 33.6 g/dL (ref 32.0–36.0)
MCV: 93.8 fL (ref 80.0–100.0)
MPV: 12.2 fL (ref 7.5–12.5)
Monocytes Relative: 11 %
Neutro Abs: 1416 cells/uL — ABNORMAL LOW (ref 1500–7800)
Neutrophils Relative %: 29.5 %
Platelets: 130 10*3/uL — ABNORMAL LOW (ref 140–400)
RBC: 4.5 10*6/uL (ref 4.20–5.80)
RDW: 12.9 % (ref 11.0–15.0)
Total Lymphocyte: 56.6 %
WBC: 4.8 10*3/uL (ref 3.8–10.8)

## 2020-11-12 LAB — LIPID PANEL
Cholesterol: 111 mg/dL (ref <200)
HDL: 58 mg/dL (ref >=40)
LDL Cholesterol (Calc): 38 mg/dL (calc)
Non-HDL Cholesterol (Calc): 53 mg/dL (calc) (ref <130)
Total CHOL/HDL Ratio: 1.9 (calc) (ref <5.0)
Triglycerides: 74 mg/dL (ref <150)

## 2020-11-12 LAB — T4, FREE: Free T4: 0.9 ng/dL (ref 0.8–1.8)

## 2020-11-12 LAB — PSA: PSA: 0.17 ng/mL (ref <=4.0)

## 2020-11-12 LAB — TSH: TSH: 3.37 mIU/L (ref 0.40–4.50)

## 2020-11-13 ENCOUNTER — Encounter: Payer: Self-pay | Admitting: Hospice and Palliative Medicine

## 2020-11-17 ENCOUNTER — Other Ambulatory Visit: Payer: Self-pay | Admitting: Hospice and Palliative Medicine

## 2020-11-17 MED ORDER — LISINOPRIL 10 MG PO TABS: 10.0000 mg | ORAL_TABLET | Freq: Every day | ORAL | 3 refills | Status: DC

## 2020-11-17 MED ORDER — LEVOTHYROXINE SODIUM 25 MCG PO TABS: 25.0000 ug | ORAL_TABLET | Freq: Every day | ORAL | 0 refills | Status: DC

## 2020-11-17 NOTE — Progress Notes (Signed)
Please call him about his labs. 1. Low potassium, I am going to discontinue HCTZ--he will need to pick up new prescription for just lisinopril as HCTZ can cause hypokalemia. 2. T4 borderline normal/low, I am going to start low dose Synthroid.  I would like him to have labs redrawn in 6 weeks and schedule a follow-up in 6-8 weeks to discuss repeat labs and assess BP.

## 2020-11-18 ENCOUNTER — Other Ambulatory Visit: Payer: Self-pay | Admitting: Hospice and Palliative Medicine

## 2020-11-18 ENCOUNTER — Telehealth: Payer: Self-pay

## 2020-11-18 NOTE — Telephone Encounter (Signed)
-----   Message from Theotis Burrow, NP sent at 11/17/2020  6:53 PM EST ----- Please call him about his labs. 1. Low potassium, I am going to discontinue HCTZ--he will need to pick up new prescription for just lisinopril as HCTZ can cause hypokalemia. 2. T4 borderline normal/low, I am going to start low dose Synthroid.  I would like him to have labs redrawn in 6 weeks and schedule a follow-up in 6-8 weeks to discuss repeat labs and assess BP.

## 2020-11-18 NOTE — Telephone Encounter (Signed)
Pt notified for labs result and change of med also advised him also discuss with cardiologist about med change

## 2020-12-21 ENCOUNTER — Ambulatory Visit: Payer: BC Managed Care – PPO | Admitting: Internal Medicine

## 2020-12-21 ENCOUNTER — Encounter: Payer: Self-pay | Admitting: Internal Medicine

## 2020-12-21 VITALS — BP 158/94 | HR 88 | Temp 97.9°F | Resp 16 | Ht 65.0 in | Wt 184.0 lb

## 2020-12-21 DIAGNOSIS — G4486 Cervicogenic headache: Secondary | ICD-10-CM

## 2020-12-21 DIAGNOSIS — I10 Essential (primary) hypertension: Secondary | ICD-10-CM | POA: Diagnosis not present

## 2020-12-21 DIAGNOSIS — H9313 Tinnitus, bilateral: Secondary | ICD-10-CM | POA: Diagnosis not present

## 2020-12-21 DIAGNOSIS — M4722 Other spondylosis with radiculopathy, cervical region: Secondary | ICD-10-CM

## 2020-12-21 MED ORDER — LISINOPRIL 20 MG PO TABS: 10.0000 mg | ORAL_TABLET | Freq: Every day | ORAL | 1 refills | Status: DC

## 2020-12-21 MED ORDER — LISINOPRIL 20 MG PO TABS: 20.0000 mg | ORAL_TABLET | Freq: Every day | ORAL | 1 refills | Status: DC
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
## 2021-01-11 ENCOUNTER — Ambulatory Visit: Payer: BC Managed Care – PPO | Admitting: Internal Medicine

## 2021-01-11 ENCOUNTER — Other Ambulatory Visit: Payer: Self-pay

## 2021-01-11 VITALS — BP 126/90 | HR 88 | Temp 98.4°F | Resp 16 | Ht 65.0 in | Wt 182.2 lb

## 2021-01-11 DIAGNOSIS — I10 Essential (primary) hypertension: Secondary | ICD-10-CM | POA: Diagnosis not present

## 2021-01-11 DIAGNOSIS — H9313 Tinnitus, bilateral: Secondary | ICD-10-CM

## 2021-01-11 DIAGNOSIS — G4486 Cervicogenic headache: Secondary | ICD-10-CM | POA: Diagnosis not present

## 2021-01-11 NOTE — Progress Notes (Signed)
[facility] [place], [place] [address]  Internal MEDICINE  Office Visit Note  Patient Name: Nicholas Torres  [id number]  [id number]  Date of Service: 01/17/2021  Chief Complaint  Patient presents with  . Headache    Still having migraines feels it may be from stress also due to brother passing away and getting arrangements taken care of.  . Hypertension    Follow up    HPI  Pt is here for ongoing c/o headache and neck pain. He was able to get an appointment at [facility] for possible service connection. Continues to have ringing in his ears as well. Blood pressure is better today  Has CAD,s/p CABG carotid and aorta atherosclerosis   Flexeril did help him with neck pain    Current Medication: Outpatient Encounter Medications as of 01/11/2021  Medication Sig  . ASPIRIN LOW DOSE 81 MG EC tablet Take 81 mg by mouth daily.  . cyclobenzaprine (FLEXERIL) 10 MG tablet Take one tab po qhs for neck spasm  . fluticasone (FLONASE) 50 MCG/ACT nasal spray PLACE 1 SPRAY INTO BOTH NOSTRILS AS NEEDED.  Marland Kitchen levothyroxine (SYNTHROID) 25 MCG tablet Take 1 tablet (25 mcg total) by mouth daily before breakfast. Take medication on an empty stomach, wait 30 minutes after taking medication to eat or take other medications.  Marland Kitchen lisinopril (ZESTRIL) 20 MG tablet Take 1 tablet (20 mg total) by mouth daily. THIS IS INCREASED DOSE  . METOPROLOL TARTRATE PO Take 25 mg by mouth daily. Pt takes 1/2 tablet daily  . REPATHA SURECLICK 166 MG/ML SOAJ SMARTSIG:1 Injection SUB-Q Every 2 Weeks  . rosuvastatin (CRESTOR) 40 MG tablet Take 1 tablet (40 mg total) by mouth every morning.   No facility-administered encounter medications on file as of 01/11/2021.    Surgical History: Past Surgical History:  Procedure Laterality Date  . CARDIAC CATHETERIZATION Right 07/24/2016   Procedure: Left Heart Cath and Coronary Angiography;  Surgeon: Dionisio David, MD;  Location: [place] [facility];  Service:  Cardiovascular;  Laterality: Right;  . CORONARY ARTERY BYPASS GRAFT N/A 07/26/2016   Procedure: CORONARY ARTERY BYPASS GRAFTING (CABG), ON PUMP, TIMES FOUR, USING BILATERAL INTERNAL MAMMARY ARTERIES, RIGHT GREATER SAPHENOUS VEIN HARVESTED ENDOSCOPICALLY;  Surgeon: Melrose Nakayama, MD;  Location: [place];  Service: Open Heart Surgery;  Laterality: N/A;  . ENDARTERECTOMY Right 01/22/2020   Procedure: ENDARTERECTOMY CAROTID;  Surgeon: Algernon Huxley, MD;  Location: [facility];  Service: Vascular;  Laterality: Right;  . INGUINAL HERNIA REPAIR Right 04/16/2020   Procedure: HERNIA REPAIR INGUINAL ADULT;  Surgeon: Fredirick Maudlin, MD;  Location: [facility];  Service: General;  Laterality: Right;  . RADIAL ARTERY HARVEST Left 07/26/2016   Procedure: RADIAL LEFT ARTERY HARVEST;  Surgeon: Melrose Nakayama, MD;  Location: [place];  Service: Open Heart Surgery;  Laterality: Left;  . TEE WITHOUT CARDIOVERSION N/A 07/26/2016   Procedure: TRANSESOPHAGEAL ECHOCARDIOGRAM (TEE);  Surgeon: Melrose Nakayama, MD;  Location: [place];  Service: Open Heart Surgery;  Laterality: N/A;    Medical History: Past Medical History:  Diagnosis Date  . Arthritis    back  . CAD (coronary artery disease) 2017  . CHF (congestive heart failure) ([place])   . High cholesterol   . Hypertension   . Myocardial infarction ([place]) 2017  . Pollen allergy     Family History: Family History  Problem Relation Age of Onset  . Diabetes Mother   . Heart attack Father   . CAD Brother  Social History   Socioeconomic History  . Marital status: Married    Spouse name: Not on file  . Number of children: Not on file  . Years of education: Not on file  . Highest education level: Not on file  Occupational History  . Not on file  Tobacco Use  . Smoking status: Never Smoker  . Smokeless tobacco: Never Used  Vaping Use  . Vaping Use: Never used  Substance and Sexual Activity  . Alcohol use: No  . Drug use: No  . Sexual activity: Yes   Other Topics Concern  . Not on file  Social History Narrative  . Not on file   Social Determinants of Health   Financial Resource Strain: Not on file  Food Insecurity: Not on file  Transportation Needs: Not on file  Physical Activity: Not on file  Stress: Not on file  Social Connections: Not on file  Intimate Partner Violence: Not on file      Review of Systems  Constitutional: Negative for chills, fatigue and unexpected weight change.  HENT: Positive for tinnitus. Negative for congestion, postnasal drip, rhinorrhea, sneezing and sore throat.   Eyes: Negative for redness.  Respiratory: Negative for cough, chest tightness and shortness of breath.   Cardiovascular: Negative for chest pain and palpitations.  Gastrointestinal: Negative for abdominal pain, constipation, diarrhea, nausea and vomiting.  Genitourinary: Negative for dysuria and frequency.  Musculoskeletal: Negative for arthralgias, back pain, joint swelling and neck pain.  Skin: Negative for rash.  Neurological: Negative.  Negative for tremors and numbness.  Hematological: Negative for adenopathy. Does not bruise/bleed easily.  Psychiatric/Behavioral: Negative for behavioral problems (Depression), sleep disturbance and suicidal ideas. The patient is not nervous/anxious.     Vital Signs: BP 126/90   Pulse 88   Temp 98.4 F (36.9 C)   Resp 16   Ht 5\' 5"  (1.651 m)   Wt 182 lb 3.2 oz (82.6 kg)   SpO2 98%   BMI 30.32 kg/m    Physical Exam Constitutional:      General: He is not in acute distress.    Appearance: He is well-developed. He is not diaphoretic.  HENT:     Head: Normocephalic and atraumatic.     Mouth/Throat:     Pharynx: No oropharyngeal exudate.  Eyes:     Pupils: Pupils are equal, round, and reactive to light.  Neck:     Thyroid: No thyromegaly.     Vascular: No JVD.     Trachea: No tracheal deviation.  Cardiovascular:     Rate and Rhythm: Normal rate and regular rhythm.     Heart  sounds: Normal heart sounds. No murmur heard. No friction rub. No gallop.   Pulmonary:     Effort: Pulmonary effort is normal. No respiratory distress.     Breath sounds: No wheezing or rales.  Chest:     Chest wall: No tenderness.  Abdominal:     General: Bowel sounds are normal.     Palpations: Abdomen is soft.  Musculoskeletal:        General: Normal range of motion.     Cervical back: Normal range of motion and neck supple.  Lymphadenopathy:     Cervical: No cervical adenopathy.  Skin:    General: Skin is warm and dry.  Neurological:     Mental Status: He is alert and oriented to person, place, and time.     Cranial Nerves: No cranial nerve deficit.  Psychiatric:  Behavior: Behavior normal.        Thought Content: Thought content normal.        Judgment: Judgment normal.        Assessment/Plan: 1. Cervicogenic headache Pt is improving but [facility]   2. Essential hypertension Continue to monitor, BP is improving   3. Tinnitus of both ears [facility] be [facility] by [facility]  General Counseling: Sullivan verbalizes understanding of the findings of todays visit and agrees with plan of treatment. I have discussed any further diagnostic evaluation that may be needed or ordered today. We also reviewed his medications today. he has been encouraged to call the office with any questions or concerns that should arise related to todays visit.   Total time spent: 35Minutes Time spent includes review of chart, medications, test results, and follow up plan with the patient.   [place] Controlled Substance Database was reviewed by me.   Dr Lavera Guise Internal medicine

## 2021-02-09 ENCOUNTER — Other Ambulatory Visit: Payer: Self-pay | Admitting: Hospice and Palliative Medicine

## 2021-02-18 ENCOUNTER — Other Ambulatory Visit: Payer: Self-pay

## 2021-02-18 ENCOUNTER — Encounter: Payer: Self-pay | Admitting: Physician Assistant

## 2021-02-18 ENCOUNTER — Ambulatory Visit: Payer: PRIVATE HEALTH INSURANCE | Admitting: Physician Assistant

## 2021-02-18 DIAGNOSIS — I10 Essential (primary) hypertension: Secondary | ICD-10-CM | POA: Diagnosis not present

## 2021-02-18 DIAGNOSIS — G479 Sleep disorder, unspecified: Secondary | ICD-10-CM

## 2021-02-18 DIAGNOSIS — H9313 Tinnitus, bilateral: Secondary | ICD-10-CM

## 2021-02-18 DIAGNOSIS — I2510 Atherosclerotic heart disease of native coronary artery without angina pectoris: Secondary | ICD-10-CM

## 2021-02-18 DIAGNOSIS — G4486 Cervicogenic headache: Secondary | ICD-10-CM | POA: Diagnosis not present

## 2021-02-18 NOTE — Progress Notes (Signed)
[facility] [place], [place] [address]  Internal MEDICINE  Office Visit Note  Patient Name: Nicholas Torres  [id number]  [id number]  Date of Service: 02/23/2021  Chief Complaint  Patient presents with  . Hypertension  . Coronary Artery Disease  . cervicogenic headaches    Has appt with Dr Vertell Limber in [place]    HPI Pt is here for f/u -He has appt later this month with Dr. Vertell Limber in [place] for cervicogenic headaches -neck pain with headaches and tinnits in ears. Will wake up with tinnitis and causes headaches. Had not had headache yet this month other than when he took new Bp med. Neck was hurting last night. Also has hx of low back pian and stiffness  -took chlorthalidone the other day and got a bad headache, will try again today and if it happens again will stop it and call Dr. Humphrey Rolls at cardiology who added that medication -BP good today despite not having taken meds today--he hasnt eaten yet today so didn't take yet -Sleep depends on tinnitis, sometimes puts TV on with timer to help drown out noise. Some days are worse than others. [facility]  Current Medication: Outpatient Encounter Medications as of 02/18/2021  Medication Sig  . ASPIRIN LOW DOSE 81 MG EC tablet Take 81 mg by mouth daily.  . baclofen (LIORESAL) 10 MG tablet Take 10 mg by mouth 3 (three) times daily.  . chlorthalidone (HYGROTON) 25 MG tablet Take 25 mg by mouth daily. Take 1 tablet by mouth once daily  . cyclobenzaprine (FLEXERIL) 10 MG tablet Take one tab po qhs for neck spasm  . fluticasone (FLONASE) 50 MCG/ACT nasal spray PLACE 1 SPRAY INTO BOTH NOSTRILS AS NEEDED.  Marland Kitchen levothyroxine (SYNTHROID) 25 MCG tablet TAKE 1 TAB DAILY BEFORE BREAKFAST ON EMPTY STOMACH, WAIT 30 MINUTES AFTER TAKING MEDICATION TO EAT OR TAKE OTHER MEDICATIONS.  Marland Kitchen lisinopril (ZESTRIL) 20 MG tablet Take 1 tablet (20 mg total) by mouth daily. THIS IS INCREASED DOSE  . METOPROLOL TARTRATE PO Take 25 mg by mouth  daily. Pt takes 1/2 tablet daily  . REPATHA SURECLICK 786 MG/ML SOAJ SMARTSIG:1 Injection SUB-Q Every 2 Weeks  . rosuvastatin (CRESTOR) 40 MG tablet Take 1 tablet (40 mg total) by mouth every morning.   No facility-administered encounter medications on file as of 02/18/2021.    Surgical History: Past Surgical History:  Procedure Laterality Date  . CARDIAC CATHETERIZATION Right 07/24/2016   Procedure: Left Heart Cath and Coronary Angiography;  Surgeon: Dionisio David, MD;  Location: [place] [facility];  Service: Cardiovascular;  Laterality: Right;  . CORONARY ARTERY BYPASS GRAFT N/A 07/26/2016   Procedure: CORONARY ARTERY BYPASS GRAFTING (CABG), ON PUMP, TIMES FOUR, USING BILATERAL INTERNAL MAMMARY ARTERIES, RIGHT GREATER SAPHENOUS VEIN HARVESTED ENDOSCOPICALLY;  Surgeon: Melrose Nakayama, MD;  Location: [place];  Service: Open Heart Surgery;  Laterality: N/A;  . ENDARTERECTOMY Right 01/22/2020   Procedure: ENDARTERECTOMY CAROTID;  Surgeon: Algernon Huxley, MD;  Location: [facility];  Service: Vascular;  Laterality: Right;  . INGUINAL HERNIA REPAIR Right 04/16/2020   Procedure: HERNIA REPAIR INGUINAL ADULT;  Surgeon: Fredirick Maudlin, MD;  Location: [facility];  Service: General;  Laterality: Right;  . RADIAL ARTERY HARVEST Left 07/26/2016   Procedure: RADIAL LEFT ARTERY HARVEST;  Surgeon: Melrose Nakayama, MD;  Location: [place];  Service: Open Heart Surgery;  Laterality: Left;  . TEE WITHOUT CARDIOVERSION N/A 07/26/2016   Procedure: TRANSESOPHAGEAL ECHOCARDIOGRAM (TEE);  Surgeon: Remo Lipps  Chaya Jan, MD;  Location: [place];  Service: Open Heart Surgery;  Laterality: N/A;    Medical History: Past Medical History:  Diagnosis Date  . Arthritis    back  . CAD (coronary artery disease) 2017  . CHF (congestive heart failure) ([place])   . High cholesterol   . Hypertension   . Myocardial infarction ([place]) 2017  . Pollen allergy     Family History: Family History  Problem Relation Age of Onset  .  Diabetes Mother   . Heart attack Father   . CAD Brother     Social History   Socioeconomic History  . Marital status: Married    Spouse name: Not on file  . Number of children: Not on file  . Years of education: Not on file  . Highest education level: Not on file  Occupational History  . Not on file  Tobacco Use  . Smoking status: Never Smoker  . Smokeless tobacco: Never Used  Vaping Use  . Vaping Use: Never used  Substance and Sexual Activity  . Alcohol use: No  . Drug use: No  . Sexual activity: Yes  Other Topics Concern  . Not on file  Social History Narrative  . Not on file   Social Determinants of Health   Financial Resource Strain: Not on file  Food Insecurity: Not on file  Transportation Needs: Not on file  Physical Activity: Not on file  Stress: Not on file  Social Connections: Not on file  Intimate Partner Violence: Not on file      Review of Systems  Constitutional: Negative for chills, fatigue and unexpected weight change.  HENT: Positive for tinnitus. Negative for congestion, rhinorrhea, sneezing and sore throat.   Eyes: Negative for redness.  Respiratory: Negative for cough, chest tightness and shortness of breath.   Cardiovascular: Negative for chest pain and palpitations.  Gastrointestinal: Negative for abdominal pain, constipation, diarrhea, nausea and vomiting.  Genitourinary: Negative for dysuria and frequency.  Musculoskeletal: Positive for back pain and neck pain. Negative for arthralgias and joint swelling.  Skin: Negative for rash.  Neurological: Positive for headaches. Negative for tremors and numbness.  Hematological: Negative for adenopathy. Does not bruise/bleed easily.  Psychiatric/Behavioral: Positive for sleep disturbance. Negative for behavioral problems (Depression) and suicidal ideas. The patient is not nervous/anxious.     Vital Signs: BP 126/88   Pulse 70   Temp 98.3 F (36.8 C)   Resp 16   Ht 5\' 5"  (1.651 m)   Wt 182  lb 6.4 oz (82.7 kg)   SpO2 98%   BMI 30.35 kg/m    Physical Exam Vitals and nursing note reviewed.  Constitutional:      General: He is not in acute distress.    Appearance: He is well-developed. He is obese. He is not diaphoretic.  HENT:     Head: Normocephalic and atraumatic.     Mouth/Throat:     Pharynx: No oropharyngeal exudate.  Eyes:     Pupils: Pupils are equal, round, and reactive to light.  Neck:     Thyroid: No thyromegaly.     Vascular: No JVD.     Trachea: No tracheal deviation.  Cardiovascular:     Rate and Rhythm: Normal rate and regular rhythm.     Heart sounds: Normal heart sounds. No murmur heard. No friction rub. No gallop.   Pulmonary:     Effort: Pulmonary effort is normal. No respiratory distress.     Breath sounds: No wheezing  or rales.  Chest:     Chest wall: No tenderness.  Abdominal:     General: Bowel sounds are normal.     Palpations: Abdomen is soft.  Musculoskeletal:        General: Normal range of motion.     Cervical back: Normal range of motion and neck supple. Tenderness present.     Comments: Low back pain and stiffness worsened with movement after being still for awhile  Lymphadenopathy:     Cervical: No cervical adenopathy.  Skin:    General: Skin is warm and dry.  Neurological:     Mental Status: He is alert and oriented to person, place, and time.     Cranial Nerves: No cranial nerve deficit.  Psychiatric:        Behavior: Behavior normal.        Thought Content: Thought content normal.        Judgment: Judgment normal.        Assessment/Plan: 1. Cervicogenic headache appt with Dr. Vertell Limber in [place] for further management, may continue flexeril prn in the meantime  2. Essential hypertension Stable today, will continue current medications though may talk to cardiology regarding newest med possibly causing headache  3. Tinnitus of both ears [facility]  4. Sleep disturbances due to tinnitus and  pain--will be [facility] by [facility] and Dr. Vertell Limber   5. Coronary artery disease involving native heart without angina pectoris, unspecified vessel or lesion type Continue Crestor daily   General Counseling: Ernan verbalizes understanding of the findings of todays visit and agrees with plan of treatment. I have discussed any further diagnostic evaluation that may be needed or ordered today. We also reviewed his medications today. he has been encouraged to call the office with any questions or concerns that should arise related to todays visit.    No orders of the defined types were placed in this encounter.   No orders of the defined types were placed in this encounter.   This patient was seen by Drema Dallas, PA-C in collaboration with Dr. Clayborn Bigness as a part of collaborative care agreement.   Total time spent:30 Minutes Time spent includes review of chart, medications, test results, and follow up plan with the patient.      Dr Lavera Guise Internal medicine

## 2021-03-02 ENCOUNTER — Other Ambulatory Visit: Payer: Self-pay | Admitting: Internal Medicine

## 2021-03-02 DIAGNOSIS — T7840XD Allergy, unspecified, subsequent encounter: Secondary | ICD-10-CM

## 2021-06-17 ENCOUNTER — Encounter: Payer: Self-pay | Admitting: Physician Assistant

## 2021-06-17 ENCOUNTER — Other Ambulatory Visit: Payer: Self-pay

## 2021-06-17 ENCOUNTER — Ambulatory Visit: Payer: BC Managed Care – PPO | Admitting: Physician Assistant

## 2021-06-17 DIAGNOSIS — M542 Cervicalgia: Secondary | ICD-10-CM | POA: Diagnosis not present

## 2021-06-17 DIAGNOSIS — E782 Mixed hyperlipidemia: Secondary | ICD-10-CM

## 2021-06-17 DIAGNOSIS — I10 Essential (primary) hypertension: Secondary | ICD-10-CM | POA: Diagnosis not present

## 2021-06-17 DIAGNOSIS — I2510 Atherosclerotic heart disease of native coronary artery without angina pectoris: Secondary | ICD-10-CM

## 2021-06-17 MED ORDER — MELOXICAM 15 MG PO TABS: 15.0000 mg | ORAL_TABLET | Freq: Every day | ORAL | 0 refills | Status: DC

## 2021-06-17 MED ORDER — OMEPRAZOLE 20 MG PO CPDR: 20.0000 mg | DELAYED_RELEASE_CAPSULE | Freq: Every day | ORAL | 0 refills | Status: DC

## 2021-06-17 MED ORDER — OMEPRAZOLE 20 MG PO CPDR: 20.0000 mg | DELAYED_RELEASE_CAPSULE | Freq: Every day | ORAL | 3 refills | Status: DC

## 2021-06-17 MED ORDER — DICLOFENAC SODIUM 1 % EX GEL: 4.0000 g | Freq: Four times a day (QID) | CUTANEOUS | 0 refills | Status: DC

## 2021-06-17 NOTE — Progress Notes (Signed)
[facility] [place], [place] [address]  Internal MEDICINE  Office Visit Note  Patient Name: Nicholas Torres  [id number]  [id number]  Date of Service: 06/17/2021  Chief Complaint  Patient presents with   Follow-up    Pain in neck down back of left arm, started about a week ago, discuss ringing in ears   Hyperlipidemia   Hypertension    HPI Pt is here for routine follow up -Pain in back on left side of neck and down back of left upper arm. He has been taking flexeril at night. Starts to hurt when laying on that side, but now has pain through the day too. Has been goin gon for about 1 week. Can pinpoint tenderness along muscles and has tension on exam. Denies any CP or chest tightness.  -he is on ASA following CABG several yeats ago and was told not to take ibuprofen and try tylenol prn. Discussed taking meloxicam short term for up to 2 weeks to help decrease inflammation. Will have him take this at a separate time from ASA and will have him take omeprazole daily for the next 2 weeks to aid in GI protection from doubling up on ASA and meloxicam. Will also have voltaren gel to use topically prn and continue flexeril as night. Discussed that we can consider xray in 2 weeks if not improving and PT ref/ortho if needed. Will try using heating pad and doing gentle exercises as well. -BP at home [id number] systolic -Sees Dr. Humphrey Rolls, cardiology next Thursday. Will discuss repatha due to cost increasing at that visit  Current Medication: Outpatient Encounter Medications as of 06/17/2021  Medication Sig   ASPIRIN LOW DOSE 81 MG EC tablet Take 81 mg by mouth daily.   baclofen (LIORESAL) 10 MG tablet Take 10 mg by mouth 3 (three) times daily.   chlorthalidone (HYGROTON) 25 MG tablet Take 25 mg by mouth daily. Take 1 tablet by mouth once daily   cyclobenzaprine (FLEXERIL) 10 MG tablet Take one tab po qhs for neck spasm   diclofenac Sodium (VOLTAREN) 1 % GEL Apply 4 g topically 4 (four)  times daily.   fluticasone (FLONASE) 50 MCG/ACT nasal spray PLACE 1 SPRAY INTO BOTH NOSTRILS DAILY AS NEEDED.   levothyroxine (SYNTHROID) 25 MCG tablet TAKE 1 TAB DAILY BEFORE BREAKFAST ON EMPTY STOMACH, WAIT 30 MINUTES AFTER TAKING MEDICATION TO EAT OR TAKE OTHER MEDICATIONS.   lisinopril (ZESTRIL) 20 MG tablet Take 1 tablet (20 mg total) by mouth daily. THIS IS INCREASED DOSE   METOPROLOL TARTRATE PO Take 25 mg by mouth daily. Pt takes 1/2 tablet daily   REPATHA SURECLICK [id number] MG/ML SOAJ SMARTSIG:1 Injection SUB-Q Every 2 Weeks   rosuvastatin (CRESTOR) 40 MG tablet Take 1 tablet (40 mg total) by mouth every morning.   [DISCONTINUED] meloxicam (MOBIC) 15 MG tablet Take 1 tablet (15 mg total) by mouth daily.   [DISCONTINUED] omeprazole (PRILOSEC) 20 MG capsule Take 1 capsule (20 mg total) by mouth daily.   meloxicam (MOBIC) 15 MG tablet Take 1 tablet (15 mg total) by mouth daily.   omeprazole (PRILOSEC) 20 MG capsule Take 1 capsule (20 mg total) by mouth daily.   No facility-administered encounter medications on file as of 06/17/2021.    Surgical History: Past Surgical History:  Procedure Laterality Date   CARDIAC CATHETERIZATION Right 07/24/2016   Procedure: Left Heart Cath and Coronary Angiography;  Surgeon: Dionisio David, MD;  Location: [facility];  Service: Cardiovascular;  Laterality:  Right;   CORONARY ARTERY BYPASS GRAFT N/A 07/26/2016   Procedure: CORONARY ARTERY BYPASS GRAFTING (CABG), ON PUMP, TIMES FOUR, USING BILATERAL INTERNAL MAMMARY ARTERIES, RIGHT GREATER SAPHENOUS VEIN HARVESTED ENDOSCOPICALLY;  Surgeon: Melrose Nakayama, MD;  Location: [facility];  Service: Open Heart Surgery;  Laterality: N/A;   ENDARTERECTOMY Right 01/22/2020   Procedure: ENDARTERECTOMY CAROTID;  Surgeon: Algernon Huxley, MD;  Location: [facility];  Service: Vascular;  Laterality: Right;   INGUINAL HERNIA REPAIR Right 04/16/2020   Procedure: HERNIA REPAIR INGUINAL ADULT;  Surgeon: Fredirick Maudlin, MD;   Location: [facility];  Service: General;  Laterality: Right;   RADIAL ARTERY HARVEST Left 07/26/2016   Procedure: RADIAL LEFT ARTERY HARVEST;  Surgeon: Melrose Nakayama, MD;  Location: [place] [facility];  Service: Open Heart Surgery;  Laterality: Left;   TEE WITHOUT CARDIOVERSION N/A 07/26/2016   Procedure: TRANSESOPHAGEAL ECHOCARDIOGRAM (TEE);  Surgeon: Melrose Nakayama, MD;  Location: [facility];  Service: Open Heart Surgery;  Laterality: N/A;    Medical History: Past Medical History:  Diagnosis Date   Arthritis    back   CAD (coronary artery disease) 2017   CHF (congestive heart failure) (HCC)    High cholesterol    Hypertension    Myocardial infarction ([place]) 2017   Pollen allergy     Family History: Family History  Problem Relation Age of Onset   Diabetes Mother    Heart attack Father    CAD Brother     Social History   Socioeconomic History   Marital status: Married    Spouse name: Not on file   Number of children: Not on file   Years of education: Not on file   Highest education level: Not on file  Occupational History   Not on file  Tobacco Use   Smoking status: Never   Smokeless tobacco: Never  Vaping Use   Vaping Use: Never used  Substance and Sexual Activity   Alcohol use: No   Drug use: No   Sexual activity: Yes  Other Topics Concern   Not on file  Social History Narrative   Not on file   Social Determinants of Health   Financial Resource Strain: Not on file  Food Insecurity: Not on file  Transportation Needs: Not on file  Physical Activity: Not on file  Stress: Not on file  Social Connections: Not on file  Intimate Partner Violence: Not on file      Review of Systems  Constitutional:  Negative for chills, fatigue and unexpected weight change.  HENT:  Negative for congestion, postnasal drip, rhinorrhea, sneezing and sore throat.   Eyes:  Negative for redness.  Respiratory:  Negative for cough, chest tightness and shortness of breath.    Cardiovascular:  Negative for chest pain and palpitations.  Gastrointestinal:  Negative for abdominal pain, constipation, diarrhea, nausea and vomiting.  Genitourinary:  Negative for dysuria and frequency.  Musculoskeletal:  Positive for arthralgias and neck pain. Negative for back pain and joint swelling.  Skin:  Negative for rash.  Neurological: Negative.  Negative for tremors and numbness.  Hematological:  Negative for adenopathy. Does not bruise/bleed easily.  Psychiatric/Behavioral:  Negative for behavioral problems (Depression), sleep disturbance and suicidal ideas. The patient is not nervous/anxious.    Vital Signs: BP 124/80 Comment: 142/96  Pulse 85   Temp (!) 97.3 F (36.3 C)   Resp 16   Ht '5\' 5"'$  (1.651 m)   Wt 182 lb 9.6 oz (82.8 kg)   SpO2 99%  BMI 30.39 kg/m    Physical Exam Vitals and nursing note reviewed.  Constitutional:      General: He is not in acute distress.    Appearance: He is well-developed. He is obese. He is not diaphoretic.  HENT:     Head: Normocephalic and atraumatic.     Mouth/Throat:     Pharynx: No oropharyngeal exudate.  Eyes:     Pupils: Pupils are equal, round, and reactive to light.  Neck:     Thyroid: No thyromegaly.     Vascular: No JVD.     Trachea: No tracheal deviation.  Cardiovascular:     Rate and Rhythm: Normal rate and regular rhythm.     Heart sounds: Normal heart sounds. No murmur heard.   No friction rub. No gallop.  Pulmonary:     Effort: Pulmonary effort is normal. No respiratory distress.     Breath sounds: No wheezing or rales.  Chest:     Chest wall: No tenderness.  Abdominal:     General: Bowel sounds are normal.     Palpations: Abdomen is soft.  Musculoskeletal:        General: Tenderness present. Normal range of motion.     Cervical back: Normal range of motion and neck supple.     Comments: Point tenderness and muscle tension on palpation along left trap  Lymphadenopathy:     Cervical: No cervical  adenopathy.  Skin:    General: Skin is warm and dry.  Neurological:     Mental Status: He is alert and oriented to person, place, and time.     Cranial Nerves: No cranial nerve deficit.  Psychiatric:        Behavior: Behavior normal.        Thought Content: Thought content normal.        Judgment: Judgment normal.       Assessment/Plan: 1. Neck pain Will take mobic in AM with his ASA at night to help with inflammation. Will take omeprazole with this for GI protection. May also use voltaren gel topically prn. Educated to heat/ice the area and to gentle stretching. If not improving after 2 weeks may consider imaging and ortho/Pt referral - diclofenac Sodium (VOLTAREN) 1 % GEL; Apply 4 g topically 4 (four) times daily.  Dispense: 100 g; Refill: 0 - meloxicam (MOBIC) 15 MG tablet; Take 1 tablet (15 mg total) by mouth daily.  Dispense: 15 tablet; Refill: 0 - omeprazole (PRILOSEC) 20 MG capsule; Take 1 capsule (20 mg total) by mouth daily.  Dispense: 15 capsule; Refill: 0  2. Essential hypertension Stable, continue current medications  3. Coronary artery disease involving native heart without angina pectoris, unspecified vessel or lesion type Continue current medications--f/u with cardiology   4. Mixed hyperlipidemia Continue crestor and will discuss his repatha script with cardiology at next visit   General Counseling: Kade verbalizes understanding of the findings of todays visit and agrees with plan of treatment. I have discussed any further diagnostic evaluation that may be needed or ordered today. We also reviewed his medications today. he has been encouraged to call the office with any questions or concerns that should arise related to todays visit.    Orders Placed This Encounter  Procedures   HM COLONOSCOPY    Meds ordered this encounter  Medications   DISCONTD: meloxicam (MOBIC) 15 MG tablet    Sig: Take 1 tablet (15 mg total) by mouth daily.    Dispense:  30 tablet  Refill:  0   DISCONTD: omeprazole (PRILOSEC) 20 MG capsule    Sig: Take 1 capsule (20 mg total) by mouth daily.    Dispense:  30 capsule    Refill:  3   diclofenac Sodium (VOLTAREN) 1 % GEL    Sig: Apply 4 g topically 4 (four) times daily.    Dispense:  100 g    Refill:  0   meloxicam (MOBIC) 15 MG tablet    Sig: Take 1 tablet (15 mg total) by mouth daily.    Dispense:  15 tablet    Refill:  0   omeprazole (PRILOSEC) 20 MG capsule    Sig: Take 1 capsule (20 mg total) by mouth daily.    Dispense:  15 capsule    Refill:  0     This patient was seen by Drema Dallas, PA-C in collaboration with Dr. Clayborn Bigness as a part of collaborative care agreement.   Total time spent:35 Minutes Time spent includes review of chart, medications, test results, and follow up plan with the patient.      Dr Lavera Guise Internal medicine

## 2021-07-17 ENCOUNTER — Other Ambulatory Visit: Payer: Self-pay | Admitting: Internal Medicine

## 2021-07-17 ENCOUNTER — Other Ambulatory Visit: Payer: Self-pay | Admitting: Physician Assistant

## 2021-07-17 DIAGNOSIS — M542 Cervicalgia: Secondary | ICD-10-CM

## 2021-07-17 DIAGNOSIS — I10 Essential (primary) hypertension: Secondary | ICD-10-CM

## 2021-08-22 ENCOUNTER — Encounter: Payer: Self-pay | Admitting: General Surgery

## 2021-08-29 ENCOUNTER — Other Ambulatory Visit: Payer: Self-pay | Admitting: Physician Assistant

## 2021-08-29 DIAGNOSIS — I2510 Atherosclerotic heart disease of native coronary artery without angina pectoris: Secondary | ICD-10-CM

## 2021-09-01 ENCOUNTER — Other Ambulatory Visit: Payer: Self-pay | Admitting: Physician Assistant

## 2021-09-01 DIAGNOSIS — M542 Cervicalgia: Secondary | ICD-10-CM

## 2021-09-06 ENCOUNTER — Ambulatory Visit (INDEPENDENT_AMBULATORY_CARE_PROVIDER_SITE_OTHER): Payer: BC Managed Care – PPO | Admitting: Vascular Surgery

## 2021-09-06 ENCOUNTER — Ambulatory Visit (INDEPENDENT_AMBULATORY_CARE_PROVIDER_SITE_OTHER): Payer: BC Managed Care – PPO

## 2021-09-06 ENCOUNTER — Encounter (INDEPENDENT_AMBULATORY_CARE_PROVIDER_SITE_OTHER): Payer: Self-pay | Admitting: Vascular Surgery

## 2021-09-06 ENCOUNTER — Other Ambulatory Visit: Payer: Self-pay

## 2021-09-06 VITALS — BP 126/80 | HR 60 | Resp 16 | Wt 189.0 lb

## 2021-09-06 DIAGNOSIS — I10 Essential (primary) hypertension: Secondary | ICD-10-CM

## 2021-09-06 DIAGNOSIS — I6521 Occlusion and stenosis of right carotid artery: Secondary | ICD-10-CM | POA: Diagnosis not present

## 2021-09-06 DIAGNOSIS — E782 Mixed hyperlipidemia: Secondary | ICD-10-CM

## 2021-09-06 NOTE — Progress Notes (Signed)
MRN : [id number]  Nicholas Torres is a 60 y.o. ([birth year]) male who presents with chief complaint of  Chief Complaint  Patient presents with   Follow-up    Ultrasound follow up  .  History of Present Illness: Patient returns in follow-up of his carotid disease.  He is a little over a year and a half status post right carotid endarterectomy for high-grade stenosis.  He is doing well.  Other than some hearing loss, he has no new complaints.  No focal neurologic symptoms.  Carotid duplex today reveals a widely patent right carotid endarterectomy and stable 1 to 39% left ICA stenosis.  Current Outpatient Medications  Medication Sig Dispense Refill   ASPIRIN LOW DOSE 81 MG EC tablet Take 81 mg by mouth daily.     baclofen (LIORESAL) 10 MG tablet Take 10 mg by mouth 3 (three) times daily.     chlorthalidone (HYGROTON) 25 MG tablet Take 25 mg by mouth daily. Take 1 tablet by mouth once daily     cyclobenzaprine (FLEXERIL) 10 MG tablet Take one tab po qhs for neck spasm 30 tablet 3   diclofenac Sodium (VOLTAREN) 1 % GEL Apply 4 g topically 4 (four) times daily. 100 g 0   fluticasone (FLONASE) 50 MCG/ACT nasal spray PLACE 1 SPRAY INTO BOTH NOSTRILS DAILY AS NEEDED. 48 mL 1   levothyroxine (SYNTHROID) 25 MCG tablet TAKE 1 TAB DAILY BEFORE BREAKFAST ON EMPTY STOMACH, WAIT 30 MINUTES AFTER TAKING MEDICATION TO EAT OR TAKE OTHER MEDICATIONS. 90 tablet 0   lisinopril (ZESTRIL) 20 MG tablet TAKE 1 TABLET (20 MG TOTAL) BY MOUTH DAILY. THIS IS INCREASED DOSE 90 tablet 1   meloxicam (MOBIC) 15 MG tablet TAKE 1 TABLET (15 MG TOTAL) BY MOUTH DAILY. 30 tablet 3   METOPROLOL TARTRATE PO Take 25 mg by mouth daily. Pt takes 1/2 tablet daily     omeprazole (PRILOSEC) 20 MG capsule TAKE 1 CAPSULE BY MOUTH EVERY DAY 30 capsule 3   REPATHA SURECLICK 676 MG/ML SOAJ SMARTSIG:1 Injection SUB-Q Every 2 Weeks     rosuvastatin (CRESTOR) 40 MG tablet TAKE 1 TABLET BY MOUTH EVERY DAY IN THE MORNING 90 tablet 1   No  current facility-administered medications for this visit.    Past Medical History:  Diagnosis Date   Arthritis    back   CAD (coronary artery disease) 2017   CHF (congestive heart failure) (HCC)    High cholesterol    Hypertension    Myocardial infarction [facility]) 2017   Pollen allergy     Past Surgical History:  Procedure Laterality Date   CARDIAC CATHETERIZATION Right 07/24/2016   Procedure: Left Heart Cath and Coronary Angiography;  Surgeon: Dionisio David, MD;  Location: [facility];  Service: Cardiovascular;  Laterality: Right;   CORONARY ARTERY BYPASS GRAFT N/A 07/26/2016   Procedure: CORONARY ARTERY BYPASS GRAFTING (CABG), ON PUMP, TIMES FOUR, USING BILATERAL INTERNAL MAMMARY ARTERIES, RIGHT GREATER SAPHENOUS VEIN HARVESTED ENDOSCOPICALLY;  Surgeon: Melrose Nakayama, MD;  Location: [facility];  Service: Open Heart Surgery;  Laterality: N/A;   ENDARTERECTOMY Right 01/22/2020   Procedure: ENDARTERECTOMY CAROTID;  Surgeon: Algernon Huxley, MD;  Location: [facility];  Service: Vascular;  Laterality: Right;   INGUINAL HERNIA REPAIR Right 04/16/2020   Procedure: HERNIA REPAIR INGUINAL ADULT;  Surgeon: Fredirick Maudlin, MD;  Location: [facility];  Service: General;  Laterality: Right;   RADIAL ARTERY HARVEST Left 07/26/2016   Procedure: RADIAL LEFT ARTERY HARVEST;  Surgeon:  Melrose Nakayama, MD;  Location: [place] [facility];  Service: Open Heart Surgery;  Laterality: Left;   TEE WITHOUT CARDIOVERSION N/A 07/26/2016   Procedure: TRANSESOPHAGEAL ECHOCARDIOGRAM (TEE);  Surgeon: Melrose Nakayama, MD;  Location: [place];  Service: Open Heart Surgery;  Laterality: N/A;     Social History   Tobacco Use   Smoking status: Never   Smokeless tobacco: Never  Vaping Use   Vaping Use: Never used  Substance Use Topics   Alcohol use: No   Drug use: No      Family History  Problem Relation Age of Onset   Diabetes Mother    Heart attack Father    CAD Brother      No Known Allergies   REVIEW  OF SYSTEMS (Negative unless checked)  Constitutional: [] Weight loss  [] Fever  [] Chills Cardiac: [] Chest pain   [] Chest pressure   [] Palpitations   [] Shortness of breath when laying flat   [] Shortness of breath at rest   [] Shortness of breath with exertion. Vascular:  [] Pain in legs with walking   [] Pain in legs at rest   [] Pain in legs when laying flat   [] Claudication   [] Pain in feet when walking  [] Pain in feet at rest  [] Pain in feet when laying flat   [] History of DVT   [] Phlebitis   [] Swelling in legs   [] Varicose veins   [] Non-healing ulcers Pulmonary:   [] Uses home oxygen   [] Productive cough   [] Hemoptysis   [] Wheeze  [] COPD   [] Asthma Neurologic:  [] Dizziness  [] Blackouts   [] Seizures   [] History of stroke   [] History of TIA  [] Aphasia   [] Temporary blindness   [] Dysphagia   [] Weakness or numbness in arms   [] Weakness or numbness in legs Musculoskeletal:  [] Arthritis   [] Joint swelling   [] Joint pain   [x] Low back pain Hematologic:  [] Easy bruising  [] Easy bleeding   [] Hypercoagulable state   [] Anemic  [] Hepatitis Gastrointestinal:  [] Blood in stool   [] Vomiting blood  [] Gastroesophageal reflux/heartburn   [] Difficulty swallowing. Genitourinary:  [] Chronic kidney disease   [] Difficult urination  [] Frequent urination  [] Burning with urination   [] Blood in urine Skin:  [] Rashes   [] Ulcers   [] Wounds Psychological:  [] History of anxiety   []  History of major depression.  Physical Examination  Vitals:   09/06/21 1114  BP: 126/80  Pulse: 60  Resp: 16  Weight: 189 lb (85.7 kg)   Body mass index is 31.45 kg/m. Gen:  WD/WN, NAD Head: [place]/AT, No temporalis wasting. Ear/Nose/Throat: Hearing grossly intact, nares w/o erythema or drainage, trachea midline Eyes: Conjunctiva clear. Sclera non-icteric Neck: Supple.  No bruit  Pulmonary:  Good air movement, equal and clear to auscultation bilaterally.  Cardiac: RRR, No JVD Vascular:  Vessel Right Left  Radial Palpable Palpable        Musculoskeletal: M/S 5/5 throughout.  No deformity or atrophy.  No edema. Neurologic: CN 2-12 intact. Sensation grossly intact in extremities.  Symmetrical.  Speech is fluent. Motor exam as listed above. Psychiatric: Judgment intact, Mood & affect appropriate for pt's clinical situation. Dermatologic: No rashes or ulcers noted.  No cellulitis or open wounds.  Right carotid endarterectomy incision well-healed     CBC Lab Results  Component Value Date   WBC 4.8 11/11/2020   HGB 14.2 11/11/2020   HCT 42.2 11/11/2020   MCV 93.8 11/11/2020   PLT 130 (L) 11/11/2020    BMET    Component Value Date/Time   NA 141  11/11/2020 0808   K 3.2 (L) 11/11/2020 0808   CL 104 11/11/2020 0808   CO2 31 11/11/2020 0808   GLUCOSE 86 11/11/2020 0808   BUN 18 11/11/2020 0808   CREATININE 1.24 11/11/2020 0808   CALCIUM 9.3 11/11/2020 0808   GFRNONAA 64 11/11/2020 0808   GFRAA 74 11/11/2020 0808   CrCl cannot be calculated (Patient's most recent lab result is older than the maximum 21 days allowed.).  COAG Lab Results  Component Value Date   INR 1.1 01/20/2020   INR 1.18 08/11/2016   INR 1.55 07/26/2016    Radiology No results found.   Assessment/Plan Carotid stenosis, right Carotid duplex shows a widely patent right carotid endarterectomy with minimal left carotid stenosis on the lower end of the 1 to 39% range.  Continue aspirin and Crestor.  He has stopped Plavix which is okay at this point.  Check annually going forward.   Essential hypertension blood pressure control important in reducing the progression of atherosclerotic disease. On appropriate oral medications.   Leotis Pain, MD  09/06/2021 11:16 AM    This note was created with Dragon medical transcription system.  Any errors from dictation are purely unintentional

## 2021-10-17 ENCOUNTER — Other Ambulatory Visit: Payer: Self-pay

## 2021-10-17 ENCOUNTER — Ambulatory Visit (INDEPENDENT_AMBULATORY_CARE_PROVIDER_SITE_OTHER): Payer: BC Managed Care – PPO | Admitting: Physician Assistant

## 2021-10-17 ENCOUNTER — Ambulatory Visit: Payer: BC Managed Care – PPO | Admitting: Physician Assistant

## 2021-10-17 ENCOUNTER — Encounter: Payer: Self-pay | Admitting: Physician Assistant

## 2021-10-17 DIAGNOSIS — E782 Mixed hyperlipidemia: Secondary | ICD-10-CM

## 2021-10-17 DIAGNOSIS — I2510 Atherosclerotic heart disease of native coronary artery without angina pectoris: Secondary | ICD-10-CM

## 2021-10-17 DIAGNOSIS — H9313 Tinnitus, bilateral: Secondary | ICD-10-CM | POA: Diagnosis not present

## 2021-10-17 DIAGNOSIS — R7989 Other specified abnormal findings of blood chemistry: Secondary | ICD-10-CM

## 2021-10-17 DIAGNOSIS — I10 Essential (primary) hypertension: Secondary | ICD-10-CM

## 2021-10-17 DIAGNOSIS — R5383 Other fatigue: Secondary | ICD-10-CM

## 2021-10-17 NOTE — Progress Notes (Signed)
[facility] [place], [place] [address]  Internal MEDICINE  Office Visit Note  Patient Name: Nicholas Torres  [id number]  [id number]  Date of Service: 10/21/2021  Chief Complaint  Patient presents with   Follow-up   Hyperlipidemia   Hypertension    HPI Pt is here for routine follow up -Dry cough lately for about a week -PT for neck and back -BP at home 122-130/68-80 -cant afford repatha, continues to take the crestor-followed by [facility] -[facility]. Has not been sleeping as well due to ringing -Due for routine labs prior to CPE, may be able to get labs through [place] and bring copies instead  Current Medication: Outpatient Encounter Medications as of 10/17/2021  Medication Sig   ASPIRIN LOW DOSE 81 MG EC tablet Take 81 mg by mouth daily.   baclofen (LIORESAL) 10 MG tablet Take 10 mg by mouth 3 (three) times daily.   chlorthalidone (HYGROTON) 25 MG tablet Take 25 mg by mouth daily. Take 1 tablet by mouth once daily   cyclobenzaprine (FLEXERIL) 10 MG tablet Take one tab po qhs for neck spasm   diclofenac Sodium (VOLTAREN) 1 % GEL Apply 4 g topically 4 (four) times daily.   fluticasone (FLONASE) 50 MCG/ACT nasal spray PLACE 1 SPRAY INTO BOTH NOSTRILS DAILY AS NEEDED.   levothyroxine (SYNTHROID) 25 MCG tablet TAKE 1 TAB DAILY BEFORE BREAKFAST ON EMPTY STOMACH, WAIT 30 MINUTES AFTER TAKING MEDICATION TO EAT OR TAKE OTHER MEDICATIONS.   lisinopril (ZESTRIL) 20 MG tablet TAKE 1 TABLET (20 MG TOTAL) BY MOUTH DAILY. THIS IS INCREASED DOSE   meloxicam (MOBIC) 15 MG tablet TAKE 1 TABLET (15 MG TOTAL) BY MOUTH DAILY.   METOPROLOL TARTRATE PO Take 25 mg by mouth daily. Pt takes 1/2 tablet daily   omeprazole (PRILOSEC) 20 MG capsule TAKE 1 CAPSULE BY MOUTH EVERY DAY   REPATHA SURECLICK 263 MG/ML SOAJ SMARTSIG:1 Injection SUB-Q Every 2 Weeks   rosuvastatin (CRESTOR) 40 MG tablet TAKE 1 TABLET BY MOUTH EVERY DAY IN THE MORNING   No  facility-administered encounter medications on file as of 10/17/2021.    Surgical History: Past Surgical History:  Procedure Laterality Date   CARDIAC CATHETERIZATION Right 07/24/2016   Procedure: Left Heart Cath and Coronary Angiography;  Surgeon: Dionisio David, MD;  Location: [place] [facility];  Service: Cardiovascular;  Laterality: Right;   CORONARY ARTERY BYPASS GRAFT N/A 07/26/2016   Procedure: CORONARY ARTERY BYPASS GRAFTING (CABG), ON PUMP, TIMES FOUR, USING BILATERAL INTERNAL MAMMARY ARTERIES, RIGHT GREATER SAPHENOUS VEIN HARVESTED ENDOSCOPICALLY;  Surgeon: Melrose Nakayama, MD;  Location: [place];  Service: Open Heart Surgery;  Laterality: N/A;   ENDARTERECTOMY Right 01/22/2020   Procedure: ENDARTERECTOMY CAROTID;  Surgeon: Algernon Huxley, MD;  Location: [facility];  Service: Vascular;  Laterality: Right;   INGUINAL HERNIA REPAIR Right 04/16/2020   Procedure: HERNIA REPAIR INGUINAL ADULT;  Surgeon: Fredirick Maudlin, MD;  Location: [facility];  Service: General;  Laterality: Right;   RADIAL ARTERY HARVEST Left 07/26/2016   Procedure: RADIAL LEFT ARTERY HARVEST;  Surgeon: Melrose Nakayama, MD;  Location: [place];  Service: Open Heart Surgery;  Laterality: Left;   TEE WITHOUT CARDIOVERSION N/A 07/26/2016   Procedure: TRANSESOPHAGEAL ECHOCARDIOGRAM (TEE);  Surgeon: Melrose Nakayama, MD;  Location: [employer];  Service: Open Heart Surgery;  Laterality: N/A;    Medical History: Past Medical History:  Diagnosis Date   Arthritis    back  CAD (coronary artery disease) 2017   CHF (congestive heart failure) (HCC)    High cholesterol    Hypertension    Myocardial infarction [facility]) 2017   Pollen allergy     Family History: Family History  Problem Relation Age of Onset   Diabetes Mother    Heart attack Father    CAD Brother     Social History   Socioeconomic History   Marital status: Married    Spouse name: Not on file   Number of children: Not on file   Years of education: Not on  file   Highest education level: Not on file  Occupational History   Not on file  Tobacco Use   Smoking status: Never   Smokeless tobacco: Never  Vaping Use   Vaping Use: Never used  Substance and Sexual Activity   Alcohol use: No   Drug use: No   Sexual activity: Yes  Other Topics Concern   Not on file  Social History Narrative   Not on file   Social Determinants of Health   Financial Resource Strain: Not on file  Food Insecurity: Not on file  Transportation Needs: Not on file  Physical Activity: Not on file  Stress: Not on file  Social Connections: Not on file  Intimate Partner Violence: Not on file      Review of Systems  Constitutional:  Negative for chills, fatigue and unexpected weight change.  HENT:  Positive for tinnitus. Negative for congestion, postnasal drip, rhinorrhea, sneezing and sore throat.   Eyes:  Negative for redness.  Respiratory:  Negative for cough, chest tightness and shortness of breath.   Cardiovascular:  Negative for chest pain and palpitations.  Gastrointestinal:  Negative for abdominal pain, constipation, diarrhea, nausea and vomiting.  Genitourinary:  Negative for dysuria and frequency.  Musculoskeletal:  Positive for arthralgias, back pain and neck pain. Negative for joint swelling.  Skin:  Negative for rash.  Neurological: Negative.  Negative for tremors and numbness.  Hematological:  Negative for adenopathy. Does not bruise/bleed easily.  Psychiatric/Behavioral:  Negative for behavioral problems (Depression), sleep disturbance and suicidal ideas. The patient is not nervous/anxious.    Vital Signs: BP 130/88   Pulse 63   Temp 97.8 F (36.6 C)   Resp 16   Ht 5\' 5"  (1.651 m)   Wt 187 lb (84.8 kg)   SpO2 99%   BMI 31.12 kg/m    Physical Exam Vitals and nursing note reviewed.  Constitutional:      General: He is not in acute distress.    Appearance: He is well-developed. He is obese. He is not diaphoretic.  HENT:     Head:  Normocephalic and atraumatic.     Mouth/Throat:     Pharynx: No oropharyngeal exudate.  Eyes:     Pupils: Pupils are equal, round, and reactive to light.  Neck:     Thyroid: No thyromegaly.     Vascular: No JVD.     Trachea: No tracheal deviation.  Cardiovascular:     Rate and Rhythm: Normal rate and regular rhythm.     Heart sounds: Normal heart sounds. No murmur heard.   No friction rub. No gallop.  Pulmonary:     Effort: Pulmonary effort is normal. No respiratory distress.     Breath sounds: No wheezing or rales.  Chest:     Chest wall: No tenderness.  Abdominal:     General: Bowel sounds are normal.     Palpations: Abdomen is  soft.  Musculoskeletal:        General: Normal range of motion.     Cervical back: Normal range of motion and neck supple.  Lymphadenopathy:     Cervical: No cervical adenopathy.  Skin:    General: Skin is warm and dry.  Neurological:     Mental Status: He is alert and oriented to person, place, and time.     Cranial Nerves: No cranial nerve deficit.  Psychiatric:        Behavior: Behavior normal.        Thought Content: Thought content normal.        Judgment: Judgment normal.       Assessment/Plan: 1. Essential hypertension Stable, continue current medications  2. [facility] both [facility], patient now has hearing [facility]  3. Coronary artery disease involving native heart without angina pectoris, unspecified vessel or lesion type Continue current medication and follow-up with cardiology  4. Mixed hyperlipidemia Continue Crestor and will update labs if not already done at the [place] - Lipid Panel With LDL/HDL Ratio  5. Abnormal thyroid blood test Will update labs with patient will bring labs from the [place] if already done - TSH + free T4  6. Other fatigue - CBC w/Diff/Platelet - Comprehensive metabolic panel   General Counseling: Jacon verbalizes understanding of the findings of todays visit and agrees with plan of  treatment. I have discussed any further diagnostic evaluation that may be needed or ordered today. We also reviewed his medications today. he has been encouraged to call the office with any questions or concerns that should arise related to todays visit.    Orders Placed This Encounter  Procedures   CBC w/Diff/Platelet   Comprehensive metabolic panel   Lipid Panel With LDL/HDL Ratio   TSH + free T4    No orders of the defined types were placed in this encounter.   This patient was seen by Drema Dallas, PA-C in collaboration with Dr. Clayborn Bigness as a part of collaborative care agreement.   Total time spent:30 Minutes Time spent includes review of chart, medications, test results, and follow up plan with the patient.      Dr Lavera Guise Internal medicine

## 2021-11-08 ENCOUNTER — Other Ambulatory Visit: Payer: Self-pay | Admitting: Internal Medicine

## 2021-11-08 DIAGNOSIS — T7840XD Allergy, unspecified, subsequent encounter: Secondary | ICD-10-CM

## 2021-11-16 ENCOUNTER — Other Ambulatory Visit: Payer: Self-pay

## 2021-11-16 ENCOUNTER — Telehealth: Payer: Self-pay

## 2021-11-16 NOTE — Telephone Encounter (Signed)
Wrote out prescription and placed on [place] desk for her to sign, advised pt

## 2021-11-17 NOTE — Telephone Encounter (Signed)
Pt advised pres for labs slip is ready for pickup and advised to do fasting labs

## 2021-11-21 ENCOUNTER — Encounter: Payer: BC Managed Care – PPO | Admitting: Physician Assistant

## 2021-12-11 ENCOUNTER — Other Ambulatory Visit: Payer: Self-pay | Admitting: Physician Assistant

## 2021-12-11 DIAGNOSIS — M542 Cervicalgia: Secondary | ICD-10-CM

## 2021-12-30 ENCOUNTER — Telehealth: Payer: Self-pay

## 2021-12-30 NOTE — Telephone Encounter (Signed)
Completed medical records for [facility], faxed to [phone]
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
## 2022-01-09 ENCOUNTER — Encounter: Payer: Self-pay | Admitting: Physician Assistant

## 2022-01-09 ENCOUNTER — Ambulatory Visit (INDEPENDENT_AMBULATORY_CARE_PROVIDER_SITE_OTHER): Payer: BC Managed Care – PPO | Admitting: Physician Assistant

## 2022-01-09 ENCOUNTER — Other Ambulatory Visit: Payer: Self-pay

## 2022-01-09 VITALS — BP 138/92 | HR 66 | Temp 98.4°F | Resp 16 | Ht 65.0 in | Wt 188.0 lb

## 2022-01-09 DIAGNOSIS — I10 Essential (primary) hypertension: Secondary | ICD-10-CM

## 2022-01-09 DIAGNOSIS — I2510 Atherosclerotic heart disease of native coronary artery without angina pectoris: Secondary | ICD-10-CM

## 2022-01-09 DIAGNOSIS — R300 Dysuria: Secondary | ICD-10-CM | POA: Diagnosis not present

## 2022-01-09 DIAGNOSIS — Z0001 Encounter for general adult medical examination with abnormal findings: Secondary | ICD-10-CM | POA: Diagnosis not present

## 2022-01-09 DIAGNOSIS — H9313 Tinnitus, bilateral: Secondary | ICD-10-CM

## 2022-01-09 NOTE — Progress Notes (Signed)
[facility] [place], [place] [address]  Internal MEDICINE  Office Visit Note  Patient Name: Nicholas Torres  [id number]  [id number]  Date of Service: 01/09/2022  Chief Complaint  Patient presents with   Annual Exam   Hyperlipidemia   Hypertension   Tinnitus     HPI Pt is here for routine health maintenance examination -[facility] this causes headaches sometimes and can be disruptive to sleep -Still doing some PT for back and neck, however he does most of the exercises at home -BP at home 140/90 recently, previously was in the 120s--possibly due to disruption in sleep and headaches caused by the tinnitus.  We will continue to monitor closely as patient may need to go back to full tablet of lisinopril.  This was previously dropped due to dizziness off and increasing at this time to avoid repeat symptoms. Taking 1/2 tab metoprolol and taking 1/2 lisinopril currently -colonoscopy due next year -Reviewed lab work but overall looked very good.  Patient does continue to have platelets and WBC slightly low -[facility] as well this past month and said everything was good except vit D was low and they started him on a supplement supplement -Discussed trying melatonin to see if this helps his sleep  Current Medication: Outpatient Encounter Medications as of 01/09/2022  Medication Sig   ASPIRIN LOW DOSE 81 MG EC tablet Take 81 mg by mouth daily.   baclofen (LIORESAL) 10 MG tablet Take 10 mg by mouth 3 (three) times daily.   cyclobenzaprine (FLEXERIL) 10 MG tablet Take one tab po qhs for neck spasm   diclofenac Sodium (VOLTAREN) 1 % GEL Apply 4 g topically 4 (four) times daily.   fluticasone (FLONASE) 50 MCG/ACT nasal spray PLACE 1 SPRAY INTO BOTH NOSTRILS DAILY AS NEEDED.   levothyroxine (SYNTHROID) 25 MCG tablet TAKE 1 TAB DAILY BEFORE BREAKFAST ON EMPTY STOMACH, WAIT 30 MINUTES AFTER TAKING MEDICATION TO EAT OR TAKE OTHER  MEDICATIONS.   lisinopril (ZESTRIL) 20 MG tablet TAKE 1 TABLET (20 MG TOTAL) BY MOUTH DAILY. THIS IS INCREASED DOSE   meloxicam (MOBIC) 15 MG tablet TAKE 1 TABLET (15 MG TOTAL) BY MOUTH DAILY.   METOPROLOL TARTRATE PO Take 25 mg by mouth daily. Pt takes 1/2 tablet daily   omeprazole (PRILOSEC) 20 MG capsule TAKE 1 CAPSULE BY MOUTH EVERY DAY   REPATHA SURECLICK 169 MG/ML SOAJ SMARTSIG:1 Injection SUB-Q Every 2 Weeks   rosuvastatin (CRESTOR) 40 MG tablet TAKE 1 TABLET BY MOUTH EVERY DAY IN THE MORNING   [DISCONTINUED] chlorthalidone (HYGROTON) 25 MG tablet Take 25 mg by mouth daily. Take 1 tablet by mouth once daily   No facility-administered encounter medications on file as of 01/09/2022.    Surgical History: Past Surgical History:  Procedure Laterality Date   CARDIAC CATHETERIZATION Right 07/24/2016   Procedure: Left Heart Cath and Coronary Angiography;  Surgeon: Dionisio David, MD;  Location: [place] [facility];  Service: Cardiovascular;  Laterality: Right;   CORONARY ARTERY BYPASS GRAFT N/A 07/26/2016   Procedure: CORONARY ARTERY BYPASS GRAFTING (CABG), ON PUMP, TIMES FOUR, USING BILATERAL INTERNAL MAMMARY ARTERIES, RIGHT GREATER SAPHENOUS VEIN HARVESTED ENDOSCOPICALLY;  Surgeon: Melrose Nakayama, MD;  Location: [place];  Service: Open Heart Surgery;  Laterality: N/A;   ENDARTERECTOMY Right 01/22/2020   Procedure: ENDARTERECTOMY CAROTID;  Surgeon: Algernon Huxley, MD;  Location: [facility];  Service: Vascular;  Laterality: Right;   INGUINAL HERNIA REPAIR  Right 04/16/2020   Procedure: HERNIA REPAIR INGUINAL ADULT;  Surgeon: Fredirick Maudlin, MD;  Location: [facility];  Service: General;  Laterality: Right;   RADIAL ARTERY HARVEST Left 07/26/2016   Procedure: RADIAL LEFT ARTERY HARVEST;  Surgeon: Melrose Nakayama, MD;  Location: [place];  Service: Open Heart Surgery;  Laterality: Left;   TEE WITHOUT CARDIOVERSION N/A 07/26/2016   Procedure: TRANSESOPHAGEAL ECHOCARDIOGRAM (TEE);  Surgeon: Melrose Nakayama, MD;  Location: [place];  Service: Open Heart Surgery;  Laterality: N/A;    Medical History: Past Medical History:  Diagnosis Date   Arthritis    back   CAD (coronary artery disease) 2017   CHF (congestive heart failure) (HCC)    High cholesterol    Hypertension    Myocardial infarction [facility]) 2017   Pollen allergy     Family History: Family History  Problem Relation Age of Onset   Diabetes Mother    Heart attack Father    CAD Brother       Review of Systems  Constitutional:  Negative for chills, fatigue and unexpected weight change.  HENT:  Positive for tinnitus. Negative for congestion, postnasal drip, rhinorrhea, sneezing and sore throat.   Eyes:  Negative for redness.  Respiratory:  Negative for cough, chest tightness and shortness of breath.   Cardiovascular:  Negative for chest pain and palpitations.  Gastrointestinal:  Negative for abdominal pain, constipation, diarrhea, nausea and vomiting.  Genitourinary:  Negative for dysuria and frequency.  Musculoskeletal:  Positive for arthralgias, back pain and neck pain. Negative for joint swelling.  Skin:  Negative for rash.  Neurological: Negative.  Negative for tremors and numbness.  Hematological:  Negative for adenopathy. Does not bruise/bleed easily.  Psychiatric/Behavioral:  Positive for sleep disturbance. Negative for behavioral problems (Depression) and suicidal ideas. The patient is not nervous/anxious.     Vital Signs: BP (!) 138/92    Pulse 66    Temp 98.4 F (36.9 C)    Resp 16    Ht 5\' 5"  (1.651 m)    Wt 188 lb (85.3 kg)    SpO2 99%    BMI 31.28 kg/m    Physical Exam Vitals and nursing note reviewed.  Constitutional:      General: He is not in acute distress.    Appearance: He is well-developed. He is obese. He is not diaphoretic.  HENT:     Head: Normocephalic and atraumatic.     Mouth/Throat:     Pharynx: No oropharyngeal exudate.  Eyes:     Pupils: Pupils are equal, round, and reactive  to light.  Neck:     Thyroid: No thyromegaly.     Vascular: No JVD.     Trachea: No tracheal deviation.  Cardiovascular:     Rate and Rhythm: Normal rate and regular rhythm.     Heart sounds: Normal heart sounds. No murmur heard.   No friction rub. No gallop.  Pulmonary:     Effort: Pulmonary effort is normal. No respiratory distress.     Breath sounds: No wheezing or rales.  Chest:     Chest wall: No tenderness.  Abdominal:     General: Bowel sounds are normal.     Palpations: Abdomen is soft.  Musculoskeletal:        General: Normal range of motion.     Cervical back: Normal range of motion and neck supple.  Lymphadenopathy:     Cervical: No cervical adenopathy.  Skin:    General: Skin is  warm and dry.  Neurological:     Mental Status: He is alert and oriented to person, place, and time.     Cranial Nerves: No cranial nerve deficit.  Psychiatric:        Behavior: Behavior normal.        Thought Content: Thought content normal.        Judgment: Judgment normal.     LABS: No results found for this or any previous visit (from the past 2160 hour(s)).      Assessment/Plan: 1. Encounter for general adult medical examination with abnormal findings CPE performed, routine fasting labs reviewed, due for colonoscopy next year  2. Essential hypertension Elevated in office and has been borderline at home the past few days.  Patient will continue to monitor closely-- if continued elevation may need to go back up to full tablet of lisinopril as he is currently taking a half tab of lisinopril and a half tab of metoprolol  3. [facility], [facility]  4. Coronary artery disease involving native heart without angina pectoris, unspecified vessel or lesion type Followed by cardiology, recent lipid panel looked good  5. Dysuria - UA/M w/rflx Culture, Routine   General Counseling: Sukhraj verbalizes understanding of the findings of todays  visit and agrees with plan of treatment. I have discussed any further diagnostic evaluation that may be needed or ordered today. We also reviewed his medications today. he has been encouraged to call the office with any questions or concerns that should arise related to todays visit.    Counseling:    Orders Placed This Encounter  Procedures   UA/M w/rflx Culture, Routine    No orders of the defined types were placed in this encounter.   This patient was seen by Drema Dallas, PA-C in collaboration with Dr. Clayborn Bigness as a part of collaborative care agreement.  Total time spent:35 Minutes  Time spent includes review of chart, medications, test results, and follow up plan with the patient.     Lavera Guise, MD  Internal Medicine

## 2022-01-10 LAB — MICROSCOPIC EXAMINATION
Bacteria, UA: NONE SEEN
Casts: NONE SEEN /lpf
Epithelial Cells (non renal): NONE SEEN /hpf (ref 0–10)
WBC, UA: NONE SEEN /hpf (ref 0–5)

## 2022-01-10 LAB — UA/M W/RFLX CULTURE, ROUTINE
Bilirubin, UA: NEGATIVE
Glucose, UA: NEGATIVE
Ketones, UA: NEGATIVE
Leukocytes,UA: NEGATIVE
Nitrite, UA: NEGATIVE
Protein,UA: NEGATIVE
RBC, UA: NEGATIVE
Specific Gravity, UA: 1.017 (ref 1.005–1.030)
Urobilinogen, Ur: 0.2 mg/dL (ref 0.2–1.0)
pH, UA: 5 (ref 5.0–7.5)

## 2022-01-17 ENCOUNTER — Other Ambulatory Visit: Payer: Self-pay | Admitting: Internal Medicine

## 2022-01-17 DIAGNOSIS — M5030 Other cervical disc degeneration, unspecified cervical region: Secondary | ICD-10-CM

## 2022-02-13 ENCOUNTER — Other Ambulatory Visit: Payer: Self-pay | Admitting: Physician Assistant

## 2022-02-13 DIAGNOSIS — I2510 Atherosclerotic heart disease of native coronary artery without angina pectoris: Secondary | ICD-10-CM

## 2022-03-14 ENCOUNTER — Telehealth: Payer: Self-pay

## 2022-03-14 NOTE — Telephone Encounter (Signed)
Faxed medical records to Suszanne Finch with [employer] at [phone]. ?

## 2022-05-01 ENCOUNTER — Ambulatory Visit: Payer: BC Managed Care – PPO | Admitting: Physician Assistant

## 2022-05-03 ENCOUNTER — Telehealth: Payer: Self-pay

## 2022-05-03 NOTE — Telephone Encounter (Signed)
Completed medical record request and faxed payment request to [employer] at [phone]. Records faxed.

## 2022-05-04 ENCOUNTER — Encounter: Payer: Self-pay | Admitting: Physician Assistant

## 2022-05-04 ENCOUNTER — Ambulatory Visit (INDEPENDENT_AMBULATORY_CARE_PROVIDER_SITE_OTHER): Payer: BC Managed Care – PPO | Admitting: Physician Assistant

## 2022-05-04 DIAGNOSIS — R7989 Other specified abnormal findings of blood chemistry: Secondary | ICD-10-CM

## 2022-05-04 DIAGNOSIS — G4733 Obstructive sleep apnea (adult) (pediatric): Secondary | ICD-10-CM

## 2022-05-04 DIAGNOSIS — N529 Male erectile dysfunction, unspecified: Secondary | ICD-10-CM

## 2022-05-04 DIAGNOSIS — I10 Essential (primary) hypertension: Secondary | ICD-10-CM

## 2022-05-04 DIAGNOSIS — E782 Mixed hyperlipidemia: Secondary | ICD-10-CM

## 2022-05-04 NOTE — Progress Notes (Signed)
[facility] [place], [place] [address]  Internal MEDICINE  Office Visit Note  Patient Name: Nicholas Torres  [id number]  [id number]  Date of Service: 05/16/2022  Chief Complaint  Patient presents with   Follow-up   Hypertension   Hyperlipidemia    HPI Pt is here for routine follow up -Still doing therapy for his back -Still taking 1/2 metoprolol and 1/2 lisinopril. Was advised to take full tab lisinopril due to rising BP last visit but has not done this and will do so now as recheck in office did not improve, 150/94 -admits he forgot about synthroid and hasnt been taking but his labs have continued to be stable without this and labs were only ever borderline. Will recheck labs and if stable then will continue to hold synthroid -concerned about his testosterone levels and is having problem with ED. Will check labs and refer to urology pending results -sleep has been ok, still closer to 5 hours per night. On cpap.  Current Medication: Outpatient Encounter Medications as of 05/04/2022  Medication Sig   ASPIRIN LOW DOSE 81 MG EC tablet Take 81 mg by mouth daily.   baclofen (LIORESAL) 10 MG tablet Take 10 mg by mouth 3 (three) times daily.   cyclobenzaprine (FLEXERIL) 10 MG tablet TAKE 1 TABLET BY MOUTH AT BEDTIME FOR NECK SPASMS   diclofenac Sodium (VOLTAREN) 1 % GEL Apply 4 g topically 4 (four) times daily.   fluticasone (FLONASE) 50 MCG/ACT nasal spray PLACE 1 SPRAY INTO BOTH NOSTRILS DAILY AS NEEDED.   levothyroxine (SYNTHROID) 25 MCG tablet TAKE 1 TAB DAILY BEFORE BREAKFAST ON EMPTY STOMACH, WAIT 30 MINUTES AFTER TAKING MEDICATION TO EAT OR TAKE OTHER MEDICATIONS.   lisinopril (ZESTRIL) 20 MG tablet TAKE 1 TABLET (20 MG TOTAL) BY MOUTH DAILY. THIS IS INCREASED DOSE   meloxicam (MOBIC) 15 MG tablet TAKE 1 TABLET (15 MG TOTAL) BY MOUTH DAILY.   METOPROLOL TARTRATE PO Take 25 mg by mouth daily. Pt takes 1/2 tablet daily   omeprazole (PRILOSEC) 20 MG capsule TAKE  1 CAPSULE BY MOUTH EVERY DAY   REPATHA SURECLICK 539 MG/ML SOAJ SMARTSIG:1 Injection SUB-Q Every 2 Weeks   rosuvastatin (CRESTOR) 40 MG tablet TAKE 1 TABLET BY MOUTH EVERY DAY IN THE MORNING   No facility-administered encounter medications on file as of 05/04/2022.    Surgical History: Past Surgical History:  Procedure Laterality Date   CARDIAC CATHETERIZATION Right 07/24/2016   Procedure: Left Heart Cath and Coronary Angiography;  Surgeon: Dionisio David, MD;  Location: [facility];  Service: Cardiovascular;  Laterality: Right;   CORONARY ARTERY BYPASS GRAFT N/A 07/26/2016   Procedure: CORONARY ARTERY BYPASS GRAFTING (CABG), ON PUMP, TIMES FOUR, USING BILATERAL INTERNAL MAMMARY ARTERIES, RIGHT GREATER SAPHENOUS VEIN HARVESTED ENDOSCOPICALLY;  Surgeon: Melrose Nakayama, MD;  Location: [place];  Service: Open Heart Surgery;  Laterality: N/A;   ENDARTERECTOMY Right 01/22/2020   Procedure: ENDARTERECTOMY CAROTID;  Surgeon: Algernon Huxley, MD;  Location: [facility];  Service: Vascular;  Laterality: Right;   INGUINAL HERNIA REPAIR Right 04/16/2020   Procedure: HERNIA REPAIR INGUINAL ADULT;  Surgeon: Fredirick Maudlin, MD;  Location: [facility];  Service: General;  Laterality: Right;   RADIAL ARTERY HARVEST Left 07/26/2016   Procedure: RADIAL LEFT ARTERY HARVEST;  Surgeon: Melrose Nakayama, MD;  Location: [facility];  Service: Open Heart Surgery;  Laterality: Left;   TEE WITHOUT CARDIOVERSION N/A 07/26/2016   Procedure: TRANSESOPHAGEAL ECHOCARDIOGRAM (TEE);  Surgeon: Melrose Nakayama,  MD;  Location: [facility];  Service: Open Heart Surgery;  Laterality: N/A;    Medical History: Past Medical History:  Diagnosis Date   Arthritis    back   CAD (coronary artery disease) 2017   CHF (congestive heart failure) (HCC)    High cholesterol    Hypertension    Myocardial infarction ([place]) 2017   Pollen allergy     Family History: Family History  Problem Relation Age of Onset   Diabetes Mother    Heart  attack Father    CAD Brother     Social History   Socioeconomic History   Marital status: Married    Spouse name: Not on file   Number of children: Not on file   Years of education: Not on file   Highest education level: Not on file  Occupational History   Not on file  Tobacco Use   Smoking status: Never   Smokeless tobacco: Never  Vaping Use   Vaping Use: Never used  Substance and Sexual Activity   Alcohol use: No   Drug use: No   Sexual activity: Yes  Other Topics Concern   Not on file  Social History Narrative   Not on file   Social Determinants of Health   Financial Resource Strain: Not on file  Food Insecurity: Not on file  Transportation Needs: Not on file  Physical Activity: Not on file  Stress: Not on file  Social Connections: Not on file  Intimate Partner Violence: Not on file      Review of Systems  Constitutional:  Negative for chills, fatigue and unexpected weight change.  HENT:  Positive for tinnitus. Negative for congestion, postnasal drip, rhinorrhea, sneezing and sore throat.   Eyes:  Negative for redness.  Respiratory:  Negative for cough, chest tightness and shortness of breath.   Cardiovascular:  Negative for chest pain and palpitations.  Gastrointestinal:  Negative for abdominal pain, constipation, diarrhea, nausea and vomiting.  Genitourinary:  Negative for dysuria and frequency.       ED  Musculoskeletal:  Positive for arthralgias, back pain and neck pain. Negative for joint swelling.  Skin:  Negative for rash.  Neurological: Negative.  Negative for tremors and numbness.  Hematological:  Negative for adenopathy. Does not bruise/bleed easily.  Psychiatric/Behavioral:  Positive for sleep disturbance. Negative for behavioral problems (Depression) and suicidal ideas. The patient is not nervous/anxious.     Vital Signs: BP (!) 147/90   Pulse 74   Temp 98.2 F (36.8 C)   Resp 16   Ht '5\' 5"'$  (1.651 m)   Wt 180 lb (81.6 kg)   SpO2 99%    BMI 29.95 kg/m    Physical Exam Vitals and nursing note reviewed.  Constitutional:      General: He is not in acute distress.    Appearance: He is well-developed. He is obese. He is not diaphoretic.  HENT:     Head: Normocephalic and atraumatic.     Mouth/Throat:     Pharynx: No oropharyngeal exudate.  Eyes:     Pupils: Pupils are equal, round, and reactive to light.  Neck:     Thyroid: No thyromegaly.     Vascular: No JVD.     Trachea: No tracheal deviation.  Cardiovascular:     Rate and Rhythm: Normal rate and regular rhythm.     Heart sounds: Normal heart sounds. No murmur heard.    No friction rub. No gallop.  Pulmonary:     Effort:  Pulmonary effort is normal. No respiratory distress.     Breath sounds: No wheezing or rales.  Chest:     Chest wall: No tenderness.  Abdominal:     General: Bowel sounds are normal.     Palpations: Abdomen is soft.  Musculoskeletal:        General: Normal range of motion.     Cervical back: Normal range of motion and neck supple.  Lymphadenopathy:     Cervical: No cervical adenopathy.  Skin:    General: Skin is warm and dry.  Neurological:     Mental Status: He is alert and oriented to person, place, and time.     Cranial Nerves: No cranial nerve deficit.  Psychiatric:        Behavior: Behavior normal.        Thought Content: Thought content normal.        Judgment: Judgment normal.        Assessment/Plan: 1. Essential hypertension Elevated in office and we will go ahead and increase to full tab of lisinopril while continuing half tab metoprolol.  Will monitor closely  2. Erectile dysfunction, unspecified erectile dysfunction type We will check labs and refer to urology if indicated - Testosterone Total,Free,Bio, Males - Prolactin - Estradiol  3. Abnormal thyroid blood test Patient has not been taking Synthroid for some time therefore we will double check that labs are stable and if so may discontinue Synthroid - TSH +  free T4  4. OSA (obstructive sleep apnea) Continue CPAP  5. Mixed hyperlipidemia Continue Crestor   General Counseling: Jaedyn verbalizes understanding of the findings of todays visit and agrees with plan of treatment. I have discussed any further diagnostic evaluation that may be needed or ordered today. We also reviewed his medications today. he has been encouraged to call the office with any questions or concerns that should arise related to todays visit.    Orders Placed This Encounter  Procedures   Testosterone Total,Free,Bio, Males   Prolactin   Estradiol   TSH + free T4   TEST AUTHORIZATION   Testosterone , Free and Total    No orders of the defined types were placed in this encounter.   This patient was seen by Drema Dallas, PA-C in collaboration with Dr. Clayborn Bigness as a part of collaborative care agreement.   Total time spent:30 Minutes Time spent includes review of chart, medications, test results, and follow up plan with the patient.      Dr Lavera Guise Internal medicine

## 2022-05-09 LAB — TEST AUTHORIZATION: TEST CODE:: 36170

## 2022-05-09 LAB — ESTRADIOL: Estradiol: 24 pg/mL (ref <=39)

## 2022-05-09 LAB — TESTOSTERONE, FREE & TOTAL
Free Testosterone: 32.8 pg/mL — ABNORMAL LOW (ref 35.0–155.0)
Testosterone, Total, LC-MS-MS: 218 ng/dL — ABNORMAL LOW (ref 250–1100)

## 2022-05-09 LAB — TSH+FREE T4: TSH W/REFLEX TO FT4: 1.03 mIU/L (ref 0.40–4.50)

## 2022-05-09 LAB — PROLACTIN: Prolactin: 4.1 ng/mL (ref 2.0–18.0)

## 2022-05-12 ENCOUNTER — Telehealth: Payer: Self-pay

## 2022-05-12 ENCOUNTER — Other Ambulatory Visit: Payer: Self-pay | Admitting: Physician Assistant

## 2022-05-12 DIAGNOSIS — R7989 Other specified abnormal findings of blood chemistry: Secondary | ICD-10-CM

## 2022-05-12 DIAGNOSIS — N529 Male erectile dysfunction, unspecified: Secondary | ICD-10-CM

## 2022-05-12 NOTE — Telephone Encounter (Signed)
-----   Message from Edd Arbour, [place] sent at 05/12/2022  1:50 PM EDT -----  ----- Message ----- From: Mylinda Latina, PA-C Sent: 05/12/2022   1:50 PM EDT To: Marshall Cork; Edd Arbour, CMA  Please let him know that his testosterone levels were low and are likely the cause of his symptoms, therefore I will go ahead and place a urology referral for him to discuss treatment options. His thyroid level was normal.

## 2022-05-12 NOTE — Telephone Encounter (Signed)
Spoke to pt, provided results, informed him referral is being worked on. Spoke with Vivien Rota and she will be working on it.

## 2022-05-18 ENCOUNTER — Ambulatory Visit (INDEPENDENT_AMBULATORY_CARE_PROVIDER_SITE_OTHER): Payer: BC Managed Care – PPO | Admitting: Urology

## 2022-05-18 ENCOUNTER — Encounter: Payer: Self-pay | Admitting: Urology

## 2022-05-18 VITALS — BP 150/92 | HR 56 | Ht 66.0 in | Wt 185.0 lb

## 2022-05-18 DIAGNOSIS — N5201 Erectile dysfunction due to arterial insufficiency: Secondary | ICD-10-CM | POA: Diagnosis not present

## 2022-05-18 DIAGNOSIS — E291 Testicular hypofunction: Secondary | ICD-10-CM

## 2022-05-18 DIAGNOSIS — R7989 Other specified abnormal findings of blood chemistry: Secondary | ICD-10-CM

## 2022-05-18 MED ORDER — TADALAFIL 5 MG PO TABS: 5.0000 mg | ORAL_TABLET | Freq: Every day | ORAL | 11 refills | Status: DC | PRN

## 2022-05-18 NOTE — Progress Notes (Signed)
05/18/22 4:33 PM   Delfino Lovett Doren Custard 1961-10-07 [id number]  CC: ED, low testosterone  HPI: 61 year old male with extensive history of CAD and bypass in 2017 who presents with ED and low testosterone.  He has never taken any medications for this.  A 1:45 PM total testosterone was low at 218.  Last PSA was normal at 0.17 in December 2021.  He denies any urinary symptoms or gross hematuria.  He does report some fatigue.   PMH: Past Medical History:  Diagnosis Date   Arthritis    back   CAD (coronary artery disease) 2017   CHF (congestive heart failure) (HCC)    High cholesterol    Hypertension    Myocardial infarction [facility]) 2017   Pollen allergy     Surgical History: Past Surgical History:  Procedure Laterality Date   CARDIAC CATHETERIZATION Right 07/24/2016   Procedure: Left Heart Cath and Coronary Angiography;  Surgeon: Dionisio David, MD;  Location: [facility];  Service: Cardiovascular;  Laterality: Right;   CORONARY ARTERY BYPASS GRAFT N/A 07/26/2016   Procedure: CORONARY ARTERY BYPASS GRAFTING (CABG), ON PUMP, TIMES FOUR, USING BILATERAL INTERNAL MAMMARY ARTERIES, RIGHT GREATER SAPHENOUS VEIN HARVESTED ENDOSCOPICALLY;  Surgeon: Melrose Nakayama, MD;  Location: [place];  Service: Open Heart Surgery;  Laterality: N/A;   ENDARTERECTOMY Right 01/22/2020   Procedure: ENDARTERECTOMY CAROTID;  Surgeon: Algernon Huxley, MD;  Location: [facility];  Service: Vascular;  Laterality: Right;   INGUINAL HERNIA REPAIR Right 04/16/2020   Procedure: HERNIA REPAIR INGUINAL ADULT;  Surgeon: Fredirick Maudlin, MD;  Location: [facility];  Service: General;  Laterality: Right;   RADIAL ARTERY HARVEST Left 07/26/2016   Procedure: RADIAL LEFT ARTERY HARVEST;  Surgeon: Melrose Nakayama, MD;  Location: [facility];  Service: Open Heart Surgery;  Laterality: Left;   TEE WITHOUT CARDIOVERSION N/A 07/26/2016   Procedure: TRANSESOPHAGEAL ECHOCARDIOGRAM (TEE);  Surgeon: Melrose Nakayama, MD;  Location: [facility];   Service: Open Heart Surgery;  Laterality: N/A;      Family History: Family History  Problem Relation Age of Onset   Diabetes Mother    Heart attack Father    CAD Brother     Social History:  reports that he has never smoked. He has never been exposed to tobacco smoke. He has never used smokeless tobacco. He reports that he does not drink alcohol and does not use drugs.  Physical Exam: BP (!) 150/92   Pulse (!) 56   Ht '5\' 6"'$  (1.676 m)   Wt 185 lb (83.9 kg)   BMI 29.86 kg/m    Constitutional:  Alert and oriented, No acute distress. Cardiovascular: No clubbing, cyanosis, or edema. Respiratory: Normal respiratory effort, no increased work of breathing. GI: Abdomen is soft, nontender, nondistended, no abdominal masses   Laboratory Data: Reviewed, see HPI  Pertinent Imaging: I have personally viewed and interpreted the CT abdomen and pelvis without contrast from February 2021 showing no urologic abnormalities.  Assessment & Plan:   61 year old male with extensive cardiac history who reports problems with erections, and also has an afternoon testosterone level that was low at 218.  We reviewed the AUA guidelines regarding evaluation management of patients with erectile dysfunction, as well as low testosterone.  We also discussed the importance of checking a testosterone in the morning.  I recommended a trial of Cialis 5 mg daily for his ED, with follow-up for repeat morning testosterone, LH, H/H, and PSA.  We briefly reviewed different types of testosterone  replacement options, as well as the controversies surrounding stroke and cardiac events with testosterone replacement.  I think if he gets good improvement in the erections on the Cialis, would try to avoid testosterone replacement unless absolutely necessary with his extensive cardiac/vascular history.  Trial of Cialis 5 mg daily, risk and benefits discussed RTC 4 morning testosterone, LH, H/H, PSA  Nickolas Madrid,  MD 05/18/2022  [facility] [address] [place], [place] [address] [phone]

## 2022-05-18 NOTE — Patient Instructions (Signed)
Erectile Dysfunction Erectile dysfunction (ED) is the inability to get or keep an erection in order to have sexual intercourse. ED is considered a symptom of an underlying disorder and is not considered a disease. ED may include: Inability to get an erection. Lack of enough hardness of the erection to allow penetration. Loss of erection before sex is finished. What are the causes? This condition may be caused by: Physical causes, such as: Artery problems. This may include heart disease, high blood pressure, atherosclerosis, and diabetes. Hormonal problems, such as low testosterone. Obesity. Nerve problems. This may include back or pelvic injuries, multiple sclerosis, Parkinson's disease, spinal cord injury, and stroke. Certain medicines, such as: Pain relievers. Antidepressants. Blood pressure medicines and water pills (diuretics). Cancer medicines. Antihistamines. Muscle relaxants. Lifestyle factors, such as: Use of drugs such as marijuana, cocaine, or opioids. Excessive use of alcohol. Smoking. Lack of physical activity or exercise. Psychological causes, such as: Anxiety or stress. Sadness or depression. Exhaustion. Fear about sexual performance. Guilt. What are the signs or symptoms? Symptoms of this condition include: Inability to get an erection. Lack of enough hardness of the erection to allow penetration. Loss of the erection before sex is finished. Sometimes having normal erections, but with frequent unsatisfactory episodes. Low sexual satisfaction in either partner due to erection problems. A curved penis occurring with erection. The curve may cause pain, or the penis may be too curved to allow for intercourse. Never having nighttime or morning erections. How is this diagnosed? This condition is often diagnosed by: Performing a physical exam to find other diseases or specific problems with the penis. Asking you detailed questions about the problem. Doing tests,  such as: Blood tests to check for diabetes mellitus or high cholesterol, or to measure hormone levels. Other tests to check for underlying health conditions. An ultrasound exam to check for scarring. A test to check blood flow to the penis. Doing a sleep study at home to measure nighttime erections. How is this treated? This condition may be treated by: Medicines, such as: Medicine taken by mouth to help you achieve an erection (oral medicine). Hormone replacement therapy to replace low testosterone levels. Medicine that is injected into the penis. Your health care provider may instruct you how to give yourself these injections at home. Medicine that is delivered with a short applicator tube. The tube is inserted into the opening at the tip of the penis, which is the opening of the urethra. A tiny pellet of medicine is put in the urethra. The pellet dissolves and enhances erectile function. This is also called MUSE (medicated urethral system for erections) therapy. Vacuum pump. This is a pump with a ring on it. The pump and ring are placed on the penis and used to create pressure that helps the penis become erect. Penile implant surgery. In this procedure, you may receive: An inflatable implant. This consists of cylinders, a pump, and a reservoir. The cylinders can be inflated with a fluid that helps to create an erection, and they can be deflated after intercourse. A semi-rigid implant. This consists of two silicone rubber rods. The rods provide some rigidity. They are also flexible, so the penis can both curve downward in its normal position and become straight for sexual intercourse. Blood vessel surgery to improve blood flow to the penis. During this procedure, a blood vessel from a different part of the body is placed into the penis to allow blood to flow around (bypass) damaged or blocked blood vessels. Lifestyle changes,  such as exercising more, losing weight, and quitting smoking. Follow  these instructions at home: Medicines  Take over-the-counter and prescription medicines only as told by your health care provider. Do not increase the dosage without first discussing it with your health care provider. If you are using self-injections, do injections as directed by your health care provider. Make sure you avoid any veins that are on the surface of the penis. After giving an injection, apply pressure to the injection site for 5 minutes. Talk to your health care provider about how to prevent headaches while taking ED medicines. These medicines may cause a sudden headache due to the increase in blood flow in your body. General instructions Exercise regularly, as directed by your health care provider. Work with your health care provider to lose weight, if needed. Do not use any products that contain nicotine or tobacco. These products include cigarettes, chewing tobacco, and vaping devices, such as e-cigarettes. If you need help quitting, ask your health care provider. Before using a vacuum pump, read the instructions that come with the pump and discuss any questions with your health care provider. Keep all follow-up visits. This is important. Contact a health care provider if: You feel nauseous. You are vomiting. You get sudden headaches while taking ED medicines. You have any concerns about your sexual health. Get help right away if: You are taking oral or injectable medicines and you have an erection that lasts longer than 4 hours. If your health care provider is unavailable, go to the nearest emergency room for evaluation. An erection that lasts much longer than 4 hours can result in permanent damage to your penis. You have severe pain in your groin or abdomen. You develop redness or severe swelling of your penis. You have redness spreading at your groin or lower abdomen. You are unable to urinate. You experience chest pain or a rapid heartbeat (palpitations) after taking oral  medicines. These symptoms may represent a serious problem that is an emergency. Do not wait to see if the symptoms will go away. Get medical help right away. Call your local emergency services (911 in the [place] Do not drive yourself to the hospital. Summary Erectile dysfunction (ED) is the inability to get or keep an erection during sexual intercourse. This condition is diagnosed based on a physical exam, your symptoms, and tests to determine the cause. Treatment varies depending on the cause and may include medicines, hormone therapy, surgery, or a vacuum pump. You may need follow-up visits to make sure that you are using your medicines or devices correctly. Get help right away if you are taking or injecting medicines and you have an erection that lasts longer than 4 hours. This information is not intended to replace advice given to you by your health care provider. Make sure you discuss any questions you have with your health care provider. Document Revised: 01/26/2021 Document Reviewed: 01/26/2021 Elsevier Patient Education  [place].  Hypogonadism, Male  Male hypogonadism is a condition of having a level of testosterone that is lower than normal. Testosterone is a chemical, or hormone, that is made mainly in the testicles. In boys, testosterone is responsible for the development of male characteristics during puberty. These include: Making the penis bigger. Growing and building the muscles. Growing facial hair. Deepening the voice. In adult men, testosterone is responsible for maintaining: An interest in sex and the ability to have sex. Muscle mass. Sperm production. Red blood cell production. Bone strength. Testosterone also gives men energy  and a sense of well-being. Testosterone normally decreases as men age and the testicles make less testosterone. Testosterone levels can vary from man to man. Not all men will have signs and symptoms of low testosterone. Weight, alcohol  use, medicines, and certain medical conditions can affect a man's testosterone level. What are the causes? This condition is caused by: A natural decrease in testosterone that occurs as a man grows older. This is the main cause of this condition. Use of medicines, such as antidepressants, steroids, and opioids. Diseases and conditions that affect the testicles or the making of testosterone. These include: Injury or damage to the testicles from trauma, cancer, cancer treatment, or infection. Diabetes. Sleep apnea. Genetic conditions that men are born with. Disease of the pituitary gland. This gland is in the brain. It produces hormones. Obesity. Metabolic syndrome. This is a group of diseases that affect blood pressure, blood sugar, cholesterol, and belly fat. HIV or AIDS. Alcohol abuse. Kidney failure. Other long-term or chronic diseases. What are the signs or symptoms? Common symptoms of this condition include: Loss of interest in sex (low sex drive). Inability to have or maintain an erection (erectile dysfunction). Feeling tired (fatigue). Mood changes, like irritability or depression. Loss of muscle and body hair. Infertility. Large breasts. Weight gain (obesity). How is this diagnosed? Your health care provider can diagnose hypogonadism based on: Your signs and symptoms. A physical exam to check your testosterone levels. This includes blood tests. Testosterone levels can change throughout the day. Levels are highest in the morning. You may need to have repeat blood tests before getting a diagnosis of hypogonadism. Depending on your medical history and test results, your health care provider may also do other tests to find the cause of low testosterone. How is this treated? This condition is treated with testosterone replacement therapy. Testosterone can be given by: Injection or through pellets inserted under the skin. Gels or patches placed on the skin or in the  mouth. Testosterone therapy is not for everyone. It has risks and side effects. Your health care provider will consider your medical history, your risk for prostate cancer, your age, and your symptoms before putting you on testosterone replacement therapy. Follow these instructions at home: Take over-the-counter and prescription medicines only as told by your health care provider. Eat foods that are high in fiber, such as beans, whole grains, and fresh fruits and vegetables. Limit foods that are high in fat and processed sugars, such as fried or sweet foods. If you drink alcohol: Limit how much you have to 0-2 drinks a day. Know how much alcohol is in your drink. In the [place] one drink equals one 12 oz bottle of beer (355 mL), one 5 oz glass of wine (148 mL), or one 1 oz glass of hard liquor (44 mL). Return to your normal activities as told by your health care provider. Ask your health care provider what activities are safe for you. Keep all follow-up visits. This is important. Contact a health care provider if: You have any of the signs or symptoms of low testosterone. You have any side effects from testosterone therapy. Summary Male hypogonadism is a condition of having a level of testosterone that is lower than normal. The natural drop in testosterone production that occurs with age is the most common cause of this condition. Low testosterone can also be caused by many diseases and conditions that affect the testicles and the making of testosterone. This condition is treated with testosterone replacement therapy. There  are risks and side effects of testosterone therapy. Your health care provider will consider your age, medical history, symptoms, and risks for prostate cancer before putting you on testosterone therapy. This information is not intended to replace advice given to you by your health care provider. Make sure you discuss any questions you have with your health care  provider. Document Revised: 07/01/2020 Document Reviewed: 07/01/2020 Elsevier Patient Education  [place].

## 2022-05-29 ENCOUNTER — Other Ambulatory Visit: Payer: BC Managed Care – PPO

## 2022-05-29 DIAGNOSIS — N5201 Erectile dysfunction due to arterial insufficiency: Secondary | ICD-10-CM

## 2022-05-30 LAB — PSA: Prostate Specific Ag, Serum: 0.2 ng/mL (ref 0.0–4.0)

## 2022-05-30 LAB — LUTEINIZING HORMONE: LH: 4 m[IU]/mL (ref 1.7–8.6)

## 2022-05-30 LAB — HEMOGLOBIN AND HEMATOCRIT, BLOOD
Hematocrit: 42 % (ref 37.5–51.0)
Hemoglobin: 13.8 g/dL (ref 13.0–17.7)

## 2022-05-30 LAB — TESTOSTERONE: Testosterone: 275 ng/dL (ref 264–916)

## 2022-06-01 ENCOUNTER — Ambulatory Visit: Payer: BC Managed Care – PPO | Admitting: Physician Assistant

## 2022-06-01 ENCOUNTER — Encounter: Payer: Self-pay | Admitting: Physician Assistant

## 2022-06-01 VITALS — BP 140/90 | HR 54 | Temp 98.0°F | Resp 16 | Ht 66.0 in | Wt 188.0 lb

## 2022-06-01 DIAGNOSIS — I6521 Occlusion and stenosis of right carotid artery: Secondary | ICD-10-CM

## 2022-06-01 DIAGNOSIS — I10 Essential (primary) hypertension: Secondary | ICD-10-CM | POA: Diagnosis not present

## 2022-06-01 NOTE — Progress Notes (Signed)
[facility] [place], [place] [address]  Internal MEDICINE  Office Visit Note  Patient Name: Nicholas Torres  [id number]  [id number]  Date of Service: 06/01/2022  Chief Complaint  Patient presents with   Follow-up   Hyperlipidemia   Hypertension    HPI Pt is here for routine follow up for HTN -has follow up with cardiology but unsure when -Has been taking full tab of lisinopril now and continues 1/2 tab metoprolol. Bp at home has still be elevated [id number] systolic. Discussed increasing to Lisinopril 1.5 tabs daily and keeping metoprolol at 1/2 tab given low HR. Pt is asymptomatic with low HR. May discuss further with cardiology when he sees them next to see if alternative may be indicated -Does also mention occasional swallowing difficulty, but doesn't feel like things get stuck and doesn't get choked. Reports everything still goes down. He thinks he may be a little paranoid about it and just wanted to mention it. Discussed monitoring symptoms and to call office if worsening as a swallow study may be indicated  Current Medication: Outpatient Encounter Medications as of 06/01/2022  Medication Sig   ASPIRIN LOW DOSE 81 MG EC tablet Take 81 mg by mouth daily.   cyclobenzaprine (FLEXERIL) 10 MG tablet TAKE 1 TABLET BY MOUTH AT BEDTIME FOR NECK SPASMS   fluticasone (FLONASE) 50 MCG/ACT nasal spray PLACE 1 SPRAY INTO BOTH NOSTRILS DAILY AS NEEDED.   levothyroxine (SYNTHROID) 25 MCG tablet TAKE 1 TAB DAILY BEFORE BREAKFAST ON EMPTY STOMACH, WAIT 30 MINUTES AFTER TAKING MEDICATION TO EAT OR TAKE OTHER MEDICATIONS.   lisinopril (ZESTRIL) 20 MG tablet TAKE 1 TABLET (20 MG TOTAL) BY MOUTH DAILY. THIS IS INCREASED DOSE   METOPROLOL TARTRATE PO Take 25 mg by mouth daily. Pt takes 1/2 tablet daily   omeprazole (PRILOSEC) 20 MG capsule TAKE 1 CAPSULE BY MOUTH EVERY DAY   rosuvastatin (CRESTOR) 40 MG tablet TAKE 1 TABLET BY MOUTH EVERY DAY IN THE MORNING   tadalafil (CIALIS)  5 MG tablet Take 1 tablet (5 mg total) by mouth daily as needed for erectile dysfunction.   No facility-administered encounter medications on file as of 06/01/2022.    Surgical History: Past Surgical History:  Procedure Laterality Date   CARDIAC CATHETERIZATION Right 07/24/2016   Procedure: Left Heart Cath and Coronary Angiography;  Surgeon: Dionisio David, MD;  Location: [facility];  Service: Cardiovascular;  Laterality: Right;   CORONARY ARTERY BYPASS GRAFT N/A 07/26/2016   Procedure: CORONARY ARTERY BYPASS GRAFTING (CABG), ON PUMP, TIMES FOUR, USING BILATERAL INTERNAL MAMMARY ARTERIES, RIGHT GREATER SAPHENOUS VEIN HARVESTED ENDOSCOPICALLY;  Surgeon: Melrose Nakayama, MD;  Location: [facility];  Service: Open Heart Surgery;  Laterality: N/A;   ENDARTERECTOMY Right 01/22/2020   Procedure: ENDARTERECTOMY CAROTID;  Surgeon: Algernon Huxley, MD;  Location: [facility];  Service: Vascular;  Laterality: Right;   INGUINAL HERNIA REPAIR Right 04/16/2020   Procedure: HERNIA REPAIR INGUINAL ADULT;  Surgeon: Fredirick Maudlin, MD;  Location: [facility];  Service: General;  Laterality: Right;   RADIAL ARTERY HARVEST Left 07/26/2016   Procedure: RADIAL LEFT ARTERY HARVEST;  Surgeon: Melrose Nakayama, MD;  Location: [facility];  Service: Open Heart Surgery;  Laterality: Left;   TEE WITHOUT CARDIOVERSION N/A 07/26/2016   Procedure: TRANSESOPHAGEAL ECHOCARDIOGRAM (TEE);  Surgeon: Melrose Nakayama, MD;  Location: [facility];  Service: Open Heart Surgery;  Laterality: N/A;    Medical History: Past Medical History:  Diagnosis Date   Arthritis  back   CAD (coronary artery disease) 2017   CHF (congestive heart failure) (HCC)    High cholesterol    Hypertension    Myocardial infarction [facility]) 2017   Pollen allergy     Family History: Family History  Problem Relation Age of Onset   Diabetes Mother    Heart attack Father    CAD Brother     Social History   Socioeconomic History   Marital status:  Married    Spouse name: Not on file   Number of children: Not on file   Years of education: Not on file   Highest education level: Not on file  Occupational History   Not on file  Tobacco Use   Smoking status: Never    Passive exposure: Never   Smokeless tobacco: Never  Vaping Use   Vaping Use: Never used  Substance and Sexual Activity   Alcohol use: No   Drug use: No   Sexual activity: Yes  Other Topics Concern   Not on file  Social History Narrative   Not on file   Social Determinants of Health   Financial Resource Strain: Not on file  Food Insecurity: Not on file  Transportation Needs: Not on file  Physical Activity: Not on file  Stress: Not on file  Social Connections: Not on file  Intimate Partner Violence: Not on file      Review of Systems  Constitutional:  Negative for chills, fatigue and unexpected weight change.  HENT:  Positive for tinnitus and trouble swallowing. Negative for congestion, postnasal drip, rhinorrhea, sneezing and sore throat.   Eyes:  Negative for redness.  Respiratory:  Negative for cough, chest tightness and shortness of breath.   Cardiovascular:  Negative for chest pain and palpitations.  Gastrointestinal:  Negative for abdominal pain, constipation, diarrhea, nausea and vomiting.  Genitourinary:  Negative for dysuria and frequency.  Musculoskeletal:  Positive for arthralgias, back pain and neck pain. Negative for joint swelling.  Skin:  Negative for rash.  Neurological: Negative.  Negative for tremors and numbness.  Hematological:  Negative for adenopathy. Does not bruise/bleed easily.  Psychiatric/Behavioral:  Positive for sleep disturbance. Negative for behavioral problems (Depression) and suicidal ideas. The patient is not nervous/anxious.     Vital Signs: BP 140/90 Comment: 148/94  Pulse (!) 54   Temp 98 F (36.7 C)   Resp 16   Ht '5\' 6"'$  (1.676 m)   Wt 188 lb (85.3 kg)   SpO2 98%   BMI 30.34 kg/m    Physical Exam Vitals  and nursing note reviewed.  Constitutional:      General: He is not in acute distress.    Appearance: He is well-developed. He is obese. He is not diaphoretic.  HENT:     Head: Normocephalic and atraumatic.     Mouth/Throat:     Pharynx: No oropharyngeal exudate.  Eyes:     Pupils: Pupils are equal, round, and reactive to light.  Neck:     Thyroid: No thyromegaly.     Vascular: No JVD.     Trachea: No tracheal deviation.  Cardiovascular:     Rate and Rhythm: Normal rate and regular rhythm.     Heart sounds: Normal heart sounds. No murmur heard.    No friction rub. No gallop.  Pulmonary:     Effort: Pulmonary effort is normal. No respiratory distress.     Breath sounds: No wheezing or rales.  Chest:     Chest wall: No tenderness.  Abdominal:     General: Bowel sounds are normal.     Palpations: Abdomen is soft.  Musculoskeletal:        General: Normal range of motion.     Cervical back: Normal range of motion and neck supple.  Lymphadenopathy:     Cervical: No cervical adenopathy.  Skin:    General: Skin is warm and dry.  Neurological:     Mental Status: He is alert and oriented to person, place, and time.     Cranial Nerves: No cranial nerve deficit.  Psychiatric:        Behavior: Behavior normal.        Thought Content: Thought content normal.        Judgment: Judgment normal.        Assessment/Plan: 1. Essential hypertension Improved in office but elevated at home as well, will increase to 1.5 labs of lisinopril and pt will notify office how he is doing with this change. HR low likely due to 1/2 tab metoprolol but pt denies any symptoms and will continue to monitor. States he will have follow up with cardiology at some point  2. Carotid stenosis, right Followed by vascular   General Counseling: Aul verbalizes understanding of the findings of todays visit and agrees with plan of treatment. I have discussed any further diagnostic evaluation that may be needed  or ordered today. We also reviewed his medications today. he has been encouraged to call the office with any questions or concerns that should arise related to todays visit.    No orders of the defined types were placed in this encounter.   No orders of the defined types were placed in this encounter.   This patient was seen by Drema Dallas, PA-C in collaboration with Dr. Clayborn Bigness as a part of collaborative care agreement.   Total time spent:30 Minutes Time spent includes review of chart, medications, test results, and follow up plan with the patient.      Dr Lavera Guise Internal medicine

## 2022-06-05 NOTE — Progress Notes (Unsigned)
06/06/2022 9:47 PM   Nicholas Torres 03-31-1961 [id number]  Referring provider: Mylinda Latina, PA-C [place],  [place] [address]  Urological history: 1. ED -contributing factors of age, testosterone deficiency, CAD, CHF, MI, HTN and HLD -SHIM *** -tadalafil 5 mg daily  2. Testosterone deficiency -contributing factors of age -testosterone, 05/29/2022 - 275 -testosterone, 05/04/2022 - 216 -free testosterone, 05/04/2022 - 32.8 -LH, 05/29/2022 - 4.0 -HBG/HCT, 05/29/2022 - 13.8/42.0 -estradiol, 05/04/2022 - 24 -PSA, 05/2022 - 0.2  No chief complaint on file.   HPI: Nicholas Torres is a 61 y.o. male who presents today for follow up after seeing Dr. Diamantina Providence for ED and low testosterone.     Score: 1-7 Severe ED 8-11 Moderate ED 12-16 Mild-Moderate ED 17-21 Mild ED 22-25 No ED     PMH: Past Medical History:  Diagnosis Date   Arthritis    back   CAD (coronary artery disease) 2017   CHF (congestive heart failure) (HCC)    High cholesterol    Hypertension    Myocardial infarction ([facility]) 2017   Pollen allergy     Surgical History: Past Surgical History:  Procedure Laterality Date   CARDIAC CATHETERIZATION Right 07/24/2016   Procedure: Left Heart Cath and Coronary Angiography;  Surgeon: Dionisio David, MD;  Location: [facility];  Service: Cardiovascular;  Laterality: Right;   CORONARY ARTERY BYPASS GRAFT N/A 07/26/2016   Procedure: CORONARY ARTERY BYPASS GRAFTING (CABG), ON PUMP, TIMES FOUR, USING BILATERAL INTERNAL MAMMARY ARTERIES, RIGHT GREATER SAPHENOUS VEIN HARVESTED ENDOSCOPICALLY;  Surgeon: Melrose Nakayama, MD;  Location: [facility];  Service: Open Heart Surgery;  Laterality: N/A;   ENDARTERECTOMY Right 01/22/2020   Procedure: ENDARTERECTOMY CAROTID;  Surgeon: Algernon Huxley, MD;  Location: [facility];  Service: Vascular;  Laterality: Right;   INGUINAL HERNIA REPAIR Right 04/16/2020   Procedure: HERNIA REPAIR INGUINAL ADULT;  Surgeon: Fredirick Maudlin, MD;  Location: [facility];  Service: General;  Laterality: Right;   RADIAL ARTERY HARVEST Left 07/26/2016   Procedure: RADIAL LEFT ARTERY HARVEST;  Surgeon: Melrose Nakayama, MD;  Location: [facility];  Service: Open Heart Surgery;  Laterality: Left;   TEE WITHOUT CARDIOVERSION N/A 07/26/2016   Procedure: TRANSESOPHAGEAL ECHOCARDIOGRAM (TEE);  Surgeon: Melrose Nakayama, MD;  Location: [facility];  Service: Open Heart Surgery;  Laterality: N/A;    Home Medications:  Allergies as of 06/06/2022   No Known Allergies      Medication List        Accurate as of June 05, 2022  9:47 PM. If you have any questions, ask your nurse or doctor.          Aspirin Low Dose 81 MG tablet Generic drug: aspirin EC Take 81 mg by mouth daily.   cyclobenzaprine 10 MG tablet Commonly known as: FLEXERIL TAKE 1 TABLET BY MOUTH AT BEDTIME FOR NECK SPASMS   fluticasone 50 MCG/ACT nasal spray Commonly known as: FLONASE PLACE 1 SPRAY INTO BOTH NOSTRILS DAILY AS NEEDED.   levothyroxine 25 MCG tablet Commonly known as: SYNTHROID TAKE 1 TAB DAILY BEFORE BREAKFAST ON EMPTY STOMACH, WAIT 30 MINUTES AFTER TAKING MEDICATION TO EAT OR TAKE OTHER MEDICATIONS.   lisinopril 20 MG tablet Commonly known as: ZESTRIL TAKE 1 TABLET (20 MG TOTAL) BY MOUTH DAILY. THIS IS INCREASED DOSE   METOPROLOL TARTRATE PO Take 25 mg by mouth daily. Pt takes 1/2 tablet daily   omeprazole 20 MG capsule Commonly known as: PRILOSEC TAKE 1 CAPSULE BY MOUTH  EVERY DAY   rosuvastatin 40 MG tablet Commonly known as: CRESTOR TAKE 1 TABLET BY MOUTH EVERY DAY IN THE MORNING   tadalafil 5 MG tablet Commonly known as: CIALIS Take 1 tablet (5 mg total) by mouth daily as needed for erectile dysfunction.        Allergies: No Known Allergies  Family History: Family History  Problem Relation Age of Onset   Diabetes Mother    Heart attack Father    CAD Brother     Social History:  reports that he has never smoked. He has  never been exposed to tobacco smoke. He has never used smokeless tobacco. He reports that he does not drink alcohol and does not use drugs.  ROS: Pertinent ROS in HPI  Physical Exam: There were no vitals taken for this visit.  Constitutional:  Well nourished. Alert and oriented, No acute distress. HEENT: [place] AT, moist mucus membranes.  Trachea midline, no masses. Cardiovascular: No clubbing, cyanosis, or edema. Respiratory: Normal respiratory effort, no increased work of breathing. GI: Abdomen is soft, non tender, non distended, no abdominal masses. Liver and spleen not palpable.  No hernias appreciated.  Stool sample for occult testing is not indicated.   GU: No CVA tenderness.  No bladder fullness or masses.  Patient with circumcised/uncircumcised phallus. ***Foreskin easily retracted***  Urethral meatus is patent.  No penile discharge. No penile lesions or rashes. Scrotum without lesions, cysts, rashes and/or edema.  Testicles are located scrotally bilaterally. No masses are appreciated in the testicles. Left and right epididymis are normal. Rectal: Patient with  normal sphincter tone. Anus and perineum without scarring or rashes. No rectal masses are appreciated. Prostate is approximately *** grams, *** nodules are appreciated. Seminal vesicles are normal. Skin: No rashes, bruises or suspicious lesions. Lymph: No cervical or inguinal adenopathy. Neurologic: Grossly intact, no focal deficits, moving all 4 extremities. Psychiatric: Normal mood and affect.  Laboratory Data:    Latest Ref Rng & Units 05/29/2022    8:58 AM 11/11/2020    8:08 AM 04/16/2020    8:09 AM  CBC  WBC 3.8 - 10.8 Thousand/uL  4.8    Hemoglobin 13.0 - 17.7 g/dL 13.8  14.2  14.6   Hematocrit 37.5 - 51.0 % 42.0  42.2  43.0   Platelets 140 - 400 Thousand/uL  130         Latest Ref Rng & Units 11/11/2020    8:08 AM 04/16/2020    8:09 AM 04/14/2020    9:43 AM  CMP  Glucose 65 - 99 mg/dL 86  88    BUN 7 - 25 mg/dL 18  11     Creatinine 0.70 - 1.33 mg/dL 1.24  1.20    Sodium 135 - 146 mmol/L 141  144    Potassium 3.5 - 5.3 mmol/L 3.2  3.3  3.1   Chloride 98 - 110 mmol/L 104  104    CO2 20 - 32 mmol/L 31     Calcium 8.6 - 10.3 mg/dL 9.3     Total Protein 6.1 - 8.1 g/dL 7.2     Total Bilirubin 0.2 - 1.2 mg/dL 0.3     AST 10 - 35 U/L 23     ALT 9 - 46 U/L 17        Lab Results  Component Value Date   PSA 0.17 11/11/2020   PSA 0.20 07/10/2013    Lab Results  Component Value Date   TESTOSTERONE 275 05/29/2022  I have  reviewed the labs.   Pertinent Imaging: N/A  Assessment & Plan:    1. ED ***  2. Testosterone deficiency Testosterone deficiency -Near castrate testosterone levels*** -Significant symptoms*** -Recommend starting TRT*** -We discussed the most common forms of replacement including intramuscular injection and gels and he desires to start injections*** -Rx testosterone cypionate-200 mg every 2 weeks to start*** -Appointment will be made for injection training*** -Follow-up 6 weeks after starting TRT for testosterone level and symptom check -Potential side effects of testosterone replacement were discussed including stimulation of benign prostatic growth with lower urinary tract symptoms; erythrocytosis; edema; gynecomastia; worsening sleep apnea; venous thromboembolism; testicular atrophy and infertility. Recent studies suggesting an increased incidence of heart attack and stroke in patients taking testosterone was discussed. He was informed there is conflicting evidence regarding the impact of testosterone therapy on cardiovascular risk. The theoretical risk of growth stimulation of an undetected prostate cancer was also discussed.  He was informed that current evidence does not provide any definitive answers regarding the risks of testosterone therapy on prostate cancer and cardiovascular disease. The need for periodic monitoring of his testosterone level, PSA, hematocrit and DRE was  discussed.      No follow-ups on file.  These notes generated with voice recognition software. I apologize for typographical errors.  Zara Council, PA-C  [facility] [address]  [place], [place] [address] [phone]

## 2022-06-06 ENCOUNTER — Encounter: Payer: Self-pay | Admitting: Urology

## 2022-06-06 ENCOUNTER — Ambulatory Visit: Payer: BC Managed Care – PPO | Admitting: Urology

## 2022-06-06 VITALS — BP 133/87 | HR 50 | Ht 66.0 in | Wt 187.0 lb

## 2022-06-06 DIAGNOSIS — N5201 Erectile dysfunction due to arterial insufficiency: Secondary | ICD-10-CM | POA: Diagnosis not present

## 2022-06-06 DIAGNOSIS — E291 Testicular hypofunction: Secondary | ICD-10-CM | POA: Diagnosis not present

## 2022-06-06 DIAGNOSIS — E349 Endocrine disorder, unspecified: Secondary | ICD-10-CM

## 2022-06-06 MED ORDER — AMBULATORY NON FORMULARY MEDICATION: 0 refills | Status: DC

## 2022-06-11 ENCOUNTER — Other Ambulatory Visit: Payer: Self-pay | Admitting: Physician Assistant

## 2022-06-11 DIAGNOSIS — M542 Cervicalgia: Secondary | ICD-10-CM

## 2022-06-14 NOTE — Progress Notes (Unsigned)
06/15/2022 8:05 AM  [name] Nov 12, 1961 [id number]   Referring provider: Mylinda Latina, PA-C [place],  [place] [address]  Urological history: 1. ED -contributing factors of age, testosterone deficiency, CAD, CHF, MI, HTN and HLD -SHIM 10 -tadalafil 5 mg daily   2. Testosterone deficiency -contributing factors of age -testosterone, 05/29/2022 - 275 -testosterone, 05/04/2022 - 216 -free testosterone, 05/04/2022 - 32.8 -LH, 05/29/2022 - 4.0 -HBG/HCT, 05/29/2022 - 13.8/42.0 -estradiol, 05/04/2022 - 24 -PSA, 05/2022 - 0.2  Chief Complaint  Patient presents with   Erectile Dysfunction    HPI: Nicholas Torres is a 61 y.o. male who has failed PDE 5 inhibitors for treatment of erectile dysfunction and is here to begin ICI titration.  The procedure is discussed with patient.  He is allowed to ask questions.  Questions were answered to his satisfaction.  We were able to complete the titration.  Physical Exam:  BP (!) 150/90   Pulse 61   Ht '5\' 6"'$  (1.676 m)   Wt 180 lb (81.6 kg)   BMI 29.05 kg/m   Constitutional:  Well nourished. Alert and oriented, No acute distress. GU: No CVA tenderness.  No bladder fullness or masses.  Patient with uncircumcised phallus. Foreskin easily retracted  Urethral meatus is patent.  No penile discharge. No penile lesions or rashes. Scrotum without lesions, cysts, rashes and/or edema.   Psychiatric: Normal mood and affect.   Procedure  Patient's left corpus cavernosum is identified.  An area near the base of the penis is cleansed with rubbing alcohol.  Careful to avoid the dorsal vein, 2 mcg of Trimix (papaverine 30 mg, phentolamine 1 mg and prostaglandin E1 10 mcg, Lot # [id number]'@12'$  exp # 07/20/2022 is injected at a 90 degree angle into the left corpus cavernosum near the base of the penis.  Patient experienced a very firm erection in 15 minutes.     Assessment & Plan:    1. ED -Good response to 0.2 cc of Trimix  30/1/10 -Patient is to report this afternoon if he was able to have satisfactory intercourse Advised patient of the condition of priapism, painful erection lasting for more than four hours, and to contact the office immediately or seek treatment in the ED    Chanice Brenton A, [place],  [place] [phone]  I spent 15 minutes on the day of the encounter to include pre-visit record review, face-to-face time with the patient, and post-visit ordering of tests.   Addendum: Patient return to clinic this afternoon with a priapism.  I attempted to bring down the priapism using phenylephrine I injected 0.2 cc, reevaluated at 5 minutes and no detumescence was noted.  I then injected another 0.2 cc reevaluated at 5 minutes and no detumescence was noted.  His blood pressure was monitored during the entire procedure.    Dr. [phone] then performed a modified Winter shunt.  Patient was prepped in standard sterile fashion.  Approximately, 3 cc of lidocaine 1% was used to make a wheal underneath the surface of the right glans.  An 18-gauge needle is then inserted parallel to the urethra into the right corpora.  There was immediate return of dark, oily blood.  Patient's penis was then allowed to continue to drain until bright red blood was seen.  Patient's penis was completely detumesced at the end of the procedure.  A small stitch of chromic was placed at the insertion site.  Vitals were taken  again after the procedure and they were stable.  He will return in 2 weeks for follow-up.  He will not inject any further Trimix until his follow-up with me.

## 2022-06-15 ENCOUNTER — Encounter: Payer: Self-pay | Admitting: Urology

## 2022-06-15 ENCOUNTER — Ambulatory Visit: Payer: BC Managed Care – PPO | Admitting: Urology

## 2022-06-15 VITALS — BP 150/90 | HR 61 | Ht 66.0 in | Wt 180.0 lb

## 2022-06-15 DIAGNOSIS — N5201 Erectile dysfunction due to arterial insufficiency: Secondary | ICD-10-CM | POA: Diagnosis not present

## 2022-06-15 NOTE — Patient Instructions (Signed)
TRIMIX SELF-INJECTION INSTRUCTIONS    DETAILED PROCEDURE  1. GETTING SET UP  A. Proper hygiene is important. Wash your hands and keep the penis clean.  B. Assemble the following:  - Bottle of Trimix  - Alcohol pad  - Syringe  C. Keep the Trimix cold by returning the bottle to the refrigerator, or by placing the bottle in a cup of ice.   2. PREPARE THE SYRINGE  A. Wipe the rubber top of the vial with an alcohol pad.  B. After removing the cap of the needle, pull the plunger back to the desired dosage, filling this volume with air. Use a new needle and syringe each time.  C. Insert the needle through the rubber top and inject the air into the vial.  D. Turn the vial with needle and syringe inserted upside down. Pull back on the syringe plunger in a slow and steady motion until the desired dosage is achieved.  E. Tap the side of the syringe (1cc tuberculin syringe with a 29 gauge needle) to allow any air bubbles to float towards the needle. Avoid having these air bubbles in the syringe when self-injecting by first injecting out the collected bubbles that may form.  F. Remove the needle from the bottle and replace the protective cap on the needle.    3. SELECT AND PREPARE THE SITE FOR INJECTION  A. The proper location for injection is at the 9-11 and 1-3 o'clock positions, between the base and mid-portion of the penis.(see diagram) Avoid the midline because of potential for injury to the urethra (6 o'clock; for urinary passage) and the penile arteries and nerves (near 12 o'clock). Avoid any visible veins or arteries on the surface.  B. Grasp and pull the head of the penis toward the side of your leg with the index finger and thumb (use the left hand, if right handed). While maintaining light tension, select a site for injection.  C. Clean the site with an alcohol pad.   4: INJECT TRIMIX AND APPLY COMPRESSION  A. With a steady and continuous motion, penetrate the skin with the needle at a 90 o  angle. The needle should then be advanced to the hub. Slight resistance is encountered as the needle passes into the proper position within the erectile tissue (corporeal body).  B. Inject the Trimix over approximately 4 seconds. Withdraw the needle from the penis and apply compression to the injection site for approximately 1 minute. Several minutes of compression may be required to avoid bleeding, especially if you are an aspirin user.  C. Replace the cap on the needle and dispose of properly.

## 2022-06-15 NOTE — Addendum Note (Signed)
Addended by: Zara Council A on: 06/15/2022 03:09 PM   Modules accepted: Level of Service

## 2022-06-29 ENCOUNTER — Ambulatory Visit: Payer: BC Managed Care – PPO | Admitting: Urology

## 2022-07-05 ENCOUNTER — Ambulatory Visit: Payer: BC Managed Care – PPO | Admitting: Urology

## 2022-07-07 ENCOUNTER — Ambulatory Visit: Payer: BC Managed Care – PPO | Admitting: Urology

## 2022-07-07 ENCOUNTER — Encounter: Payer: Self-pay | Admitting: Urology

## 2022-07-07 VITALS — BP 151/84 | HR 66 | Ht 66.0 in | Wt 188.0 lb

## 2022-07-07 DIAGNOSIS — N5201 Erectile dysfunction due to arterial insufficiency: Secondary | ICD-10-CM | POA: Diagnosis not present

## 2022-07-07 MED ORDER — TADALAFIL 20 MG PO TABS: 20.0000 mg | ORAL_TABLET | Freq: Every day | ORAL | 3 refills | Status: DC | PRN

## 2022-07-07 NOTE — Progress Notes (Signed)
   07/07/2022 8:59 AM  Nicholas Torres October 31, 1961 [id number]   Referring provider: Mylinda Latina, PA-C [place],  [place] [address]  Urological history: 1. ED -contributing factors of age, testosterone deficiency, CAD, CHF, MI, HTN and HLD -tadalafil 5 mg daily   2. Testosterone deficiency -contributing factors of age -testosterone, 05/29/2022 - 275 -testosterone, 05/04/2022 - 216 -free testosterone, 05/04/2022 - 32.8 -LH, 05/29/2022 - 4.0 -HBG/HCT, 05/29/2022 - 13.8/42.0 -estradiol, 05/04/2022 - 24 -PSA, 05/2022 - 0.2  Chief Complaint  Patient presents with   Follow-up    HPI: Nicholas Torres is a 61 y.o. male who experienced priapism after the injection of his 2 mcg of Trimix 30/1/10 2 weeks ago.  The priapism was successfully treated with a modified Winter shunt.   Physical Exam:  BP (!) 151/84   Pulse 66   Ht '5\' 6"'$  (1.676 m)   Wt 188 lb (85.3 kg)   BMI 30.34 kg/m   Constitutional:  Well nourished. Alert and oriented, No acute distress. HEENT: [place] AT, moist mucus membranes.  Trachea midline Cardiovascular: No clubbing, cyanosis, or edema. Respiratory: Normal respiratory effort, no increased work of breathing. GU: No CVA tenderness.  No bladder fullness or masses.  Patient with uncircumcised phallus. Foreskin easily retracted  Urethral meatus is patent.  No penile discharge. No penile lesions or rashes. Scrotum without lesions, cysts, rashes and/or edema.   Neurologic: Grossly intact, no focal deficits, moving all 4 extremities. Psychiatric: Normal mood and affect.    Assessment & Plan:    1. ED -Patient would like to have a retrial of the tadalafil, so we will have him take tadalafil 20 mg daily -If he does not see improvement with the daily Cialis, I have advised him to discontinue the Cialis for 2 days prior to attempting ICI again -If he should inject himself, I advised him only to do 1 mcg of the Trimix only   Return in about 1 month (around  08/07/2022) for SHIM .    [facility] [address] [place], [place] [address] [phone]

## 2022-07-07 NOTE — Patient Instructions (Addendum)
Do not inject unless you have stopped the Cialis (tadalafil) 20 mg two days prior    TRIMIX SELF-INJECTION INSTRUCTIONS    DETAILED PROCEDURE  1. GETTING SET UP  A. Proper hygiene is important. Wash your hands and keep the penis clean.  B. Assemble the following:  - Bottle of Trimix  - Alcohol pad  - Syringe  C. Keep the Trimix cold by returning the bottle to the refrigerator, or by placing the bottle in a cup of ice.   2. PREPARE THE SYRINGE  A. Wipe the rubber top of the vial with an alcohol pad.  B. After removing the cap of the needle, pull the plunger back to the desired dosage ( 0.1 on the syringe), filling this volume with air. Use a new needle and syringe each time.  C. Insert the needle through the rubber top and inject the air into the vial.  D. Turn the vial with needle and syringe inserted upside down. Pull back on the syringe plunger in a slow and steady motion until the desired dosage is achieved.  E. Tap the side of the syringe (1cc tuberculin syringe with a 29 gauge needle) to allow any air bubbles to float towards the needle. Avoid having these air bubbles in the syringe when self-injecting by first injecting out the collected bubbles that may form.  F. Remove the needle from the bottle and replace the protective cap on the needle.    3. SELECT AND PREPARE THE SITE FOR INJECTION  A. The proper location for injection is at the 9-11 and 1-3 o'clock positions, between the base and mid-portion of the penis.(see diagram) Avoid the midline because of potential for injury to the urethra (6 o'clock; for urinary passage) and the penile arteries and nerves (near 12 o'clock). Avoid any visible veins or arteries on the surface.  B. Grasp and pull the head of the penis toward the side of your leg with the index finger and thumb (use the left hand, if right handed). While maintaining light tension, select a site for injection.  C. Clean the site with an alcohol pad.   4: INJECT TRIMIX  AND APPLY COMPRESSION  A. With a steady and continuous motion, penetrate the skin with the needle at a 90 o angle. The needle should then be advanced to the hub. Slight resistance is encountered as the needle passes into the proper position within the erectile tissue (corporeal body).  B. Inject the Trimix over approximately 4 seconds. Withdraw the needle from the penis and apply compression to the injection site for approximately 1 minute. Several minutes of compression may be required to avoid bleeding, especially if you are an aspirin user.  C. Replace the cap on the needle and dispose of properly.   If you experience priapism, painful erection lasting for more than four hours, and to contact the office or seek treatment in the ED immediately

## 2022-07-24 ENCOUNTER — Encounter: Payer: Self-pay | Admitting: Emergency Medicine

## 2022-07-24 ENCOUNTER — Ambulatory Visit
Admission: EM | Admit: 2022-07-24 | Discharge: 2022-07-24 | Disposition: A | Discharge status: Home / Self Care | Payer: Non-veteran care | Attending: Urgent Care | Admitting: Urgent Care

## 2022-07-24 DIAGNOSIS — R1310 Dysphagia, unspecified: Secondary | ICD-10-CM | POA: Diagnosis not present

## 2022-07-24 LAB — POCT RAPID STREP A (OFFICE): Rapid Strep A Screen: NEGATIVE

## 2022-07-24 NOTE — Discharge Instructions (Signed)
Follow-up with the [facility] or your primary care provider who can order imaging of your neck and/or referral to an ENT specialist.

## 2022-07-24 NOTE — ED Provider Notes (Signed)
Nicholas Torres    CSN: [id number] Arrival date & time: 07/24/22  1646      History   Chief Complaint Chief Complaint  Patient presents with   Sore Throat    HPI Nicholas Torres is a 61 y.o. male.    Sore Throat   Presents in urgent care [facility] complained about sore throat x "months".  He states that he has discomfort when he eats or drinks especially when turning his head.  He also states that he has an uncomfortable feeling in his throat when he coughs.  Relevant history is carotid surgery, open heart surgery.  Patient denies any history or knowledge of acid reflux.  States that the sensation he feels is not acutely painful.  Has some difficulty describing the sensation and describes as "a feeling."  He reiterates that the sensation is worse when he turns his head and refers to his carotid surgery.  Past Medical History:  Diagnosis Date   Arthritis    back   CAD (coronary artery disease) 2017   CHF (congestive heart failure) (HCC)    High cholesterol    Hypertension    Myocardial infarction [facility]) 2017   Pollen allergy     Patient Active Problem List   Diagnosis Date Noted   Reducible right inguinal hernia    Carotid stenosis, right 01/22/2020   Carotid stenosis 01/09/2020   Mixed hyperlipidemia 05/19/2019   Blood in the stool 10/07/2018   Irritable bowel syndrome with constipation 10/07/2018   Screening for colon cancer 04/10/2018   Essential hypertension 04/10/2018   Dysuria 04/10/2018   Syncope 08/11/2016   CAD (coronary artery disease) 07/26/2016   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   CAD (coronary artery disease), native coronary artery 07/25/2016   NSTEMI (non-ST elevated myocardial infarction) ([place]) 07/23/2016   Family history of ischemic heart disease (IHD) 07/10/2013    Past Surgical History:  Procedure Laterality Date   CARDIAC CATHETERIZATION Right 07/24/2016   Procedure: Left Heart  Cath and Coronary Angiography;  Surgeon: Dionisio David, MD;  Location: [facility];  Service: Cardiovascular;  Laterality: Right;   CORONARY ARTERY BYPASS GRAFT N/A 07/26/2016   Procedure: CORONARY ARTERY BYPASS GRAFTING (CABG), ON PUMP, TIMES FOUR, USING BILATERAL INTERNAL MAMMARY ARTERIES, RIGHT GREATER SAPHENOUS VEIN HARVESTED ENDOSCOPICALLY;  Surgeon: Melrose Nakayama, MD;  Location: [facility];  Service: Open Heart Surgery;  Laterality: N/A;   ENDARTERECTOMY Right 01/22/2020   Procedure: ENDARTERECTOMY CAROTID;  Surgeon: Algernon Huxley, MD;  Location: [facility];  Service: Vascular;  Laterality: Right;   INGUINAL HERNIA REPAIR Right 04/16/2020   Procedure: HERNIA REPAIR INGUINAL ADULT;  Surgeon: Fredirick Maudlin, MD;  Location: [facility];  Service: General;  Laterality: Right;   RADIAL ARTERY HARVEST Left 07/26/2016   Procedure: RADIAL LEFT ARTERY HARVEST;  Surgeon: Melrose Nakayama, MD;  Location: [facility];  Service: Open Heart Surgery;  Laterality: Left;   TEE WITHOUT CARDIOVERSION N/A 07/26/2016   Procedure: TRANSESOPHAGEAL ECHOCARDIOGRAM (TEE);  Surgeon: Melrose Nakayama, MD;  Location: [facility];  Service: Open Heart Surgery;  Laterality: N/A;       Home Medications    Prior to Admission medications   Medication Sig Start Date End Date Taking? Authorizing Provider  ASPIRIN LOW DOSE 81 MG EC tablet Take 81 mg by mouth daily. 04/26/20  Yes [provider]  levothyroxine (SYNTHROID) 25 MCG tablet TAKE 1 TAB DAILY BEFORE BREAKFAST ON EMPTY  STOMACH, WAIT 30 MINUTES AFTER TAKING MEDICATION TO EAT OR TAKE OTHER MEDICATIONS. 02/09/21  Yes Luiz Ochoa, NP  lisinopril (ZESTRIL) 20 MG tablet TAKE 1 TABLET (20 MG TOTAL) BY MOUTH DAILY. THIS IS INCREASED DOSE 07/17/21  Yes Lavera Guise, MD  METOPROLOL TARTRATE PO Take 25 mg by mouth daily. Pt takes 1/2 tablet daily   Yes [provider]  rosuvastatin (CRESTOR) 40 MG tablet TAKE 1 TABLET BY MOUTH EVERY DAY IN THE MORNING 02/13/22   Yes McDonough, Lauren K, PA-C  AMBULATORY NON FORMULARY MEDICATION Trimix (30/1/10)-(Pap/Phent/PGE)  Test Dose  80m vial   Qty #3 Refills 0  [phone] [phone]/25/23   MZara CouncilA, PA-C  cyclobenzaprine (FLEXERIL) 10 MG tablet TAKE 1 TABLET BY MOUTH AT BEDTIME FOR NECK SPASMS 01/18/22   KLavera Guise MD  fluticasone ([facility] 50 MCG/ACT nasal spray PLACE 1 SPRAY INTO BOTH NOSTRILS DAILY AS NEEDED. 11/08/21   McDonough, LSi Gaul PA-C  omeprazole (PRILOSEC) 20 MG capsule TAKE 1 CAPSULE BY MOUTH EVERY DAY 06/11/22   McDonough, Lauren K, PA-C  tadalafil (CIALIS) 20 MG tablet Take 1 tablet (20 mg total) by mouth daily as needed for erectile dysfunction. 07/07/22   MNori Riis PA-C    Family History Family History  Problem Relation Age of Onset   Diabetes Mother    Heart attack Father    CAD Brother     Social History Social History   Tobacco Use   Smoking status: Never    Passive exposure: Never   Smokeless tobacco: Never  Vaping Use   Vaping Use: Never used  Substance Use Topics   Alcohol use: No   Drug use: No     Allergies   Patient has no known allergies.   Review of Systems Review of Systems   Physical Exam Triage Vital Signs ED Triage Vitals [07/24/22 1709]  Enc Vitals Group     BP (!) 165/97     Pulse Rate (!) 55     Resp 16     Temp 99.1 F (37.3 C)     Temp Source Oral     SpO2 98 %     Weight      Height      Head Circumference      Peak Flow      Pain Score 4     Pain Loc      Pain Edu?      Excl. in [place]    No data found.  Updated Vital Signs BP (!) 165/97 (BP Location: Right Arm)   Pulse (!) 55   Temp 99.1 F (37.3 C) (Oral)   Resp 16   SpO2 98%   Visual Acuity Right Eye Distance:   Left Eye Distance:   Bilateral Distance:    Right Eye Near:   Left Eye Near:    Bilateral Near:     Physical Exam Vitals reviewed.  Constitutional:      Appearance: He is well-developed. He is  not ill-appearing.  HENT:     Mouth/Throat:     Mouth: Mucous membranes are moist.     Pharynx: No oropharyngeal exudate or posterior oropharyngeal erythema.  Skin:    General: Skin is warm and dry.  Neurological:     General: No focal deficit present.     Mental Status: He is alert and oriented to person, place, and time.  Psychiatric:        Mood  and Affect: Mood normal.        Behavior: Behavior normal.      UC Treatments / Results  Labs (all labs ordered are listed, but only abnormal results are displayed) Labs Reviewed - No data to display  EKG   Radiology No results found.  Procedures Procedures (including critical care time)  Medications Ordered in UC Medications - No data to display  Initial Impression / Assessment and Plan / UC Course  I have reviewed the triage vital signs and the nursing notes.  Pertinent labs & imaging results that were available during my care of the patient were reviewed by me and considered in my medical decision making (see chart for details).   But strep negative.  Most likely scar tissue 2/2 carotid surgery.  Spent some time talking to the patient about the differential diagnosis for his symptoms.  Encouraged the patient to return either to the [place] or to his outside primary care and request imaging of his neck and referral to ENT.  Listened to the patient's frustration at being sent to urgent care from the [place].     Final Clinical Impressions(s) / UC Diagnoses   Final diagnoses:  None   Discharge Instructions   None    ED Prescriptions   None    PDMP not reviewed this encounter.   Rose Phi, [place] 07/24/22 1749

## 2022-07-24 NOTE — ED Triage Notes (Signed)
Patient c/o sore throat x " months".   Patient endorses intermittent sore throat and " it's been on and off for months".   Patient endorses " when I eat or drink I have some difficulty".   Patient endorses painful swallowing.   Patient endorses pain while coughing.   Patient has not taken any medications for symptoms.

## 2022-08-03 NOTE — Progress Notes (Signed)
   08/04/2022 9:18 AM  Nicholas Torres Feb 23, 1961 [id number]   Referring provider: Mylinda Latina, PA-C [place],  [place] [address]  Urological history: 1. ED -contributing factors of age, testosterone deficiency, CAD, CHF, MI, HTN and HLD -tadalafil 5 mg daily   2. Testosterone deficiency -contributing factors of age -testosterone, 05/29/2022 - 275 -testosterone, 05/04/2022 - 216 -free testosterone, 05/04/2022 - 32.8 -LH, 05/29/2022 - 4.0 -HBG/HCT, 05/29/2022 - 13.8/42.0 -estradiol, 05/04/2022 - 24 -PSA, 05/2022 - 0.2  Chief Complaint  Patient presents with   Follow-up    1 month follow up with SHIM     HPI: Nicholas Torres is a 61 y.o. male who presents today for 1 month follow-up after trial of daily tadalafil.  He did not pick up the 20 mg tadalafil prescription, so he took what he had on hand which was the 10 mg tadalafil daily.  Patient still having spontaneous erections.  He denies any pain or curvature with erections.    He states sometimes he can achieve a satisfactory erection, but it only allows him to have intercourse once.  Once intercourse is completed he cannot get an erection for the rest of the day.  He is not interested in using ICI again.  He is not interested in TRT.  SHIM 12    SHIM     Row Name 08/04/22 0903         SHIM: Over the last 6 months:   How do you rate your confidence that you could get and keep an erection? Moderate     When you had erections with sexual stimulation, how often were your erections hard enough for penetration (entering your partner)? A Few Times (much less than half the time)     During sexual intercourse, how often were you able to maintain your erection after you had penetrated (entered) your partner? A Few Times (much less than half the time)     During sexual intercourse, how difficult was it to maintain your erection to completion of intercourse? Difficult     When you attempted sexual intercourse,  how often was it satisfactory for you? A Few Times (much less than half the time)       SHIM Total Score   SHIM 12              Score: 1-7 Severe ED 8-11 Moderate ED 12-16 Mild-Moderate ED 17-21 Mild ED 22-25 No ED     Physical Exam:  BP (!) 150/95   Pulse 62   Ht '5\' 6"'$  (1.676 m)   Wt 188 lb 1.6 oz (85.3 kg)   BMI 30.36 kg/m   Constitutional:  Well nourished. Alert and oriented, No acute distress. HEENT: [place] AT, moist mucus membranes.  Trachea midline Cardiovascular: No clubbing, cyanosis, or edema. Respiratory: Normal respiratory effort, no increased work of breathing. Neurologic: Grossly intact, no focal deficits, moving all 4 extremities. Psychiatric: Normal mood and affect.   Assessment & Plan:    1. ED -Patient will carry a hard prescription over to the pharmacy to ensure that he gets the tadalafil 20 mg prescription filled  Return in about 1 month (around 09/03/2022) for SHIM.    [facility] [address] [place], [place] [address] [phone]

## 2022-08-04 ENCOUNTER — Ambulatory Visit: Payer: BC Managed Care – PPO | Admitting: Urology

## 2022-08-04 ENCOUNTER — Telehealth: Payer: Self-pay | Admitting: *Deleted

## 2022-08-04 ENCOUNTER — Encounter: Payer: Self-pay | Admitting: Urology

## 2022-08-04 VITALS — BP 150/95 | HR 62 | Ht 66.0 in | Wt 188.1 lb

## 2022-08-04 DIAGNOSIS — N5201 Erectile dysfunction due to arterial insufficiency: Secondary | ICD-10-CM | POA: Diagnosis not present

## 2022-08-04 MED ORDER — TADALAFIL 20 MG PO TABS: 20.0000 mg | ORAL_TABLET | Freq: Every day | ORAL | 3 refills | Status: DC | PRN

## 2022-08-04 MED ORDER — TADALAFIL 20 MG PO TABS: 20.0000 mg | ORAL_TABLET | Freq: Every day | ORAL | 3 refills | Status: AC | PRN

## 2022-08-04 NOTE — Telephone Encounter (Signed)
Pt states that the cialis is on backorder at [facility], requested that we send to [facility]. rx sent to pharmacy by e-script

## 2022-08-09 ENCOUNTER — Other Ambulatory Visit: Payer: Self-pay | Admitting: Physician Assistant

## 2022-08-09 DIAGNOSIS — I2510 Atherosclerotic heart disease of native coronary artery without angina pectoris: Secondary | ICD-10-CM

## 2022-08-31 ENCOUNTER — Ambulatory Visit: Payer: BC Managed Care – PPO | Admitting: Physician Assistant

## 2022-08-31 ENCOUNTER — Encounter: Payer: Self-pay | Admitting: Physician Assistant

## 2022-08-31 VITALS — BP 128/84 | Temp 98.4°F | Resp 16 | Ht 66.0 in | Wt 187.0 lb

## 2022-08-31 DIAGNOSIS — R1310 Dysphagia, unspecified: Secondary | ICD-10-CM | POA: Diagnosis not present

## 2022-08-31 DIAGNOSIS — K219 Gastro-esophageal reflux disease without esophagitis: Secondary | ICD-10-CM

## 2022-08-31 DIAGNOSIS — I10 Essential (primary) hypertension: Secondary | ICD-10-CM

## 2022-08-31 NOTE — Progress Notes (Signed)
[facility] [place], [place] [address]  Internal MEDICINE  Office Visit Note  Patient Name: Nicholas Torres  [id number]  [id number]  Date of Service: 09/06/2022  Chief Complaint  Patient presents with   Follow-up   Hyperlipidemia   Hypertension   Quality Metric Gaps    Tetanus Vaccine    HPI Pt is here for routine follow up -Went to urgent care last month due to eating something and feeling like it got stuck in throat down into chest He described sore throat when coughing -No other difficulty swallowing or feeling of things sticking recently. Does usually feel something when swallowing, if turned to right, and unsure if connection to scar tissue from previous right carotid surgery. -He may also be having some reflux as he only takes OTC strength omeprazole and may need to increase in future. He reports his cardiologist may be putting him back on plavix and he is going to discuss with his vascular provider before moving forward with this. He doesnt want to start it if not necessary. If so he may need to adjust GERD medication to avoid interaction -BP fluctuates some at home. He will get headaches occasionally and will check BP and sometimes BP will be up to 010U systolic. Takes tylenol as needed -Takign 1 tab of '20mg'$  lisinopril and 1/2 tab metoprolol. He had been sent a '10mg'$  lisinopril to take in combination with '20mg'$  for total of '30mg'$  however he has not been doing this. Discussed this is equivalent to taking 1.5 tabs of his '20mg'$  lisinopril and can try that for when BP rising. BP stable in office.  Current Medication: Outpatient Encounter Medications as of 08/31/2022  Medication Sig   AMBULATORY NON FORMULARY MEDICATION Trimix (30/1/10)-(Pap/Phent/PGE)  Test Dose  65m vial   Qty #3 Refills 0  [phone] [phone]  ASPIRIN LOW DOSE 81 MG EC tablet Take 81 mg by mouth daily.   cyclobenzaprine (FLEXERIL) 10 MG tablet TAKE 1  TABLET BY MOUTH AT BEDTIME FOR NECK SPASMS   fluticasone (FLONASE) 50 MCG/ACT nasal spray PLACE 1 SPRAY INTO BOTH NOSTRILS DAILY AS NEEDED.   levothyroxine (SYNTHROID) 25 MCG tablet TAKE 1 TAB DAILY BEFORE BREAKFAST ON EMPTY STOMACH, WAIT 30 MINUTES AFTER TAKING MEDICATION TO EAT OR TAKE OTHER MEDICATIONS.   lisinopril (ZESTRIL) 10 MG tablet Take 1 tablet by mouth daily.   lisinopril (ZESTRIL) 20 MG tablet TAKE 1 TABLET (20 MG TOTAL) BY MOUTH DAILY. THIS IS INCREASED DOSE   METOPROLOL TARTRATE PO Take 25 mg by mouth daily. Pt takes 1/2 tablet daily   omeprazole (PRILOSEC) 20 MG capsule TAKE 1 CAPSULE BY MOUTH EVERY DAY   rosuvastatin (CRESTOR) 40 MG tablet TAKE 1 TABLET BY MOUTH EVERY DAY IN THE MORNING   tadalafil (CIALIS) 20 MG tablet Take 1 tablet (20 mg total) by mouth daily as needed for erectile dysfunction.   No facility-administered encounter medications on file as of 08/31/2022.    Surgical History: Past Surgical History:  Procedure Laterality Date   CARDIAC CATHETERIZATION Right 07/24/2016   Procedure: Left Heart Cath and Coronary Angiography;  Surgeon: SDionisio David MD;  [facility];  Service: Cardiovascular;  Laterality: Right;   CORONARY ARTERY BYPASS GRAFT N/A 07/26/2016   Procedure: CORONARY ARTERY BYPASS GRAFTING (CABG), ON PUMP, TIMES FOUR, USING BILATERAL INTERNAL MAMMARY ARTERIES, RIGHT GREATER SAPHENOUS VEIN HARVESTED ENDOSCOPICALLY;  Surgeon: SMelrose Nakayama MD;  Location[place] [facility]  Service: Open Heart Surgery;  Laterality: N/A;   ENDARTERECTOMY Right 01/22/2020   Procedure: ENDARTERECTOMY CAROTID;  Surgeon: Algernon Huxley, MD;  Location: [facility];  Service: Vascular;  Laterality: Right;   INGUINAL HERNIA REPAIR Right 04/16/2020   Procedure: HERNIA REPAIR INGUINAL ADULT;  Surgeon: Fredirick Maudlin, MD;  Location: [facility];  Service: General;  Laterality: Right;   RADIAL ARTERY HARVEST Left 07/26/2016   Procedure: RADIAL LEFT ARTERY HARVEST;  Surgeon:  Melrose Nakayama, MD;  Location: [facility];  Service: Open Heart Surgery;  Laterality: Left;   TEE WITHOUT CARDIOVERSION N/A 07/26/2016   Procedure: TRANSESOPHAGEAL ECHOCARDIOGRAM (TEE);  Surgeon: Melrose Nakayama, MD;  Location: [facility];  Service: Open Heart Surgery;  Laterality: N/A;    Medical History: Past Medical History:  Diagnosis Date   Arthritis    back   CAD (coronary artery disease) 2017   CHF (congestive heart failure) (HCC)    High cholesterol    Hypertension    Myocardial infarction ([place]) 2017   Pollen allergy     Family History: Family History  Problem Relation Age of Onset   Diabetes Mother    Heart attack Father    CAD Brother     Social History   Socioeconomic History   Marital status: Married    Spouse name: Not on file   Number of children: Not on file   Years of education: Not on file   Highest education level: Not on file  Occupational History   Not on file  Tobacco Use   Smoking status: Never    Passive exposure: Never   Smokeless tobacco: Never  Vaping Use   Vaping Use: Never used  Substance and Sexual Activity   Alcohol use: No   Drug use: No   Sexual activity: Yes  Other Topics Concern   Not on file  Social History Narrative   Not on file   Social Determinants of Health   Financial Resource Strain: Not on file  Food Insecurity: Not on file  Transportation Needs: Not on file  Physical Activity: Not on file  Stress: Not on file  Social Connections: Not on file  Intimate Partner Violence: Not on file      Review of Systems  Constitutional:  Negative for chills, fatigue and unexpected weight change.  HENT:  Positive for tinnitus and trouble swallowing. Negative for congestion, postnasal drip, rhinorrhea, sneezing and sore throat.   Eyes:  Negative for redness.  Respiratory:  Negative for cough, chest tightness and shortness of breath.   Cardiovascular:  Negative for chest pain and palpitations.  Gastrointestinal:  Negative  for abdominal pain, constipation, diarrhea, nausea and vomiting.  Genitourinary:  Negative for dysuria and frequency.  Musculoskeletal:  Positive for arthralgias, back pain and neck pain. Negative for joint swelling.  Skin:  Negative for rash.  Neurological: Negative.  Negative for tremors and numbness.  Hematological:  Negative for adenopathy. Does not bruise/bleed easily.  Psychiatric/Behavioral:  Positive for sleep disturbance. Negative for behavioral problems (Depression) and suicidal ideas. The patient is not nervous/anxious.     Vital Signs: BP 128/84   Temp 98.4 F (36.9 C)   Resp 16   Ht '5\' 6"'$  (1.676 m)   Wt 187 lb (84.8 kg)   SpO2 98%   BMI 30.18 kg/m    Physical Exam Vitals and nursing note reviewed.  Constitutional:      General: He is not in acute distress.    Appearance: Normal appearance. He is well-developed. He is obese. He  is not diaphoretic.  HENT:     Head: Normocephalic and atraumatic.     Mouth/Throat:     Pharynx: No oropharyngeal exudate.  Eyes:     Pupils: Pupils are equal, round, and reactive to light.  Neck:     Thyroid: No thyromegaly.     Vascular: No JVD.     Trachea: No tracheal deviation.  Cardiovascular:     Rate and Rhythm: Normal rate and regular rhythm.     Heart sounds: Normal heart sounds. No murmur heard.    No friction rub. No gallop.  Pulmonary:     Effort: Pulmonary effort is normal. No respiratory distress.     Breath sounds: No wheezing or rales.  Chest:     Chest wall: No tenderness.  Abdominal:     General: Bowel sounds are normal.     Palpations: Abdomen is soft.  Musculoskeletal:        General: Normal range of motion.     Cervical back: Normal range of motion and neck supple.  Lymphadenopathy:     Cervical: No cervical adenopathy.  Skin:    General: Skin is warm and dry.  Neurological:     Mental Status: He is alert and oriented to person, place, and time.     Cranial Nerves: No cranial nerve deficit.   Psychiatric:        Behavior: Behavior normal.        Thought Content: Thought content normal.        Judgment: Judgment normal.        Assessment/Plan: 1. Dysphagia, unspecified type Will order swallow study to evaluate further - DG SWALLOW FUNC SPEECH PATH; Future  2. Essential hypertension Stable in office, May take extra 1/2 tab lisinopril as needed if BP rising.  3. Gastroesophageal reflux disease, unspecified whether esophagitis present Patient aware omeprazole will need to be changed if he is going to restart on plavix and will notify office if alternative needed   General Counseling: Reyden verbalizes understanding of the findings of todays visit and agrees with plan of treatment. I have discussed any further diagnostic evaluation that may be needed or ordered today. We also reviewed his medications today. he has been encouraged to call the office with any questions or concerns that should arise related to todays visit.    Orders Placed This Encounter  Procedures   DG SWALLOW FUNC SPEECH PATH    No orders of the defined types were placed in this encounter.   This patient was seen by Drema Dallas, PA-C in collaboration with Dr. Clayborn Bigness as a part of collaborative care agreement.   Total time spent:30 Minutes Time spent includes review of chart, medications, test results, and follow up plan with the patient.      Dr Lavera Guise Internal medicine

## 2022-09-04 NOTE — Progress Notes (Signed)
   09/05/2022 9:57 AM  Nicholas Torres 1961/08/11 [id number]   Referring provider: Mylinda Latina, PA-C [place],  [place] [address]  Urological history: 1. ED -contributing factors of age, testosterone deficiency, CAD, CHF, MI, HTN and HLD -SHIM 11 -tadalafil 5 mg daily   2. Testosterone deficiency -contributing factors of age -testosterone, 05/29/2022 - 275 -testosterone, 05/04/2022 - 216 -free testosterone, 05/04/2022 - 32.8 -LH, 05/29/2022 - 4.0 -HBG/HCT, 05/29/2022 - 13.8/42.0 -estradiol, 05/04/2022 - 24 -PSA, 05/2022 - 0.2  Chief Complaint  Patient presents with   Erectile Dysfunction    HPI: Nicholas Torres is a 61 y.o. male who presents today for 1 month follow-up after trial of 20 mg tadalafil.    He was taking the tadalafil 20 mg every two days.  He had one successful episode of sexual intercourse.  Patient is not having spontaneous erections.  He denies any pain or curvature with erections.    SHIM 11   SHIM     Row Name 09/05/22 0941         SHIM: Over the last 6 months:   How do you rate your confidence that you could get and keep an erection? Low     When you had erections with sexual stimulation, how often were your erections hard enough for penetration (entering your partner)? A Few Times (much less than half the time)     During sexual intercourse, how often were you able to maintain your erection after you had penetrated (entered) your partner? A Few Times (much less than half the time)     During sexual intercourse, how difficult was it to maintain your erection to completion of intercourse? Difficult     When you attempted sexual intercourse, how often was it satisfactory for you? A Few Times (much less than half the time)       SHIM Total Score   SHIM 11               Score: 1-7 Severe ED 8-11 Moderate ED 12-16 Mild-Moderate ED 17-21 Mild ED 22-25 No ED     Physical Exam:  BP 129/87   Pulse (!) 55   Ht '5\' 6"'$   (1.676 m)   Wt 185 lb (83.9 kg)   BMI 29.86 kg/m   Constitutional:  Well nourished. Alert and oriented, No acute distress. HEENT: [place] AT, moist mucus membranes.  Trachea midline Cardiovascular: No clubbing, cyanosis, or edema. Respiratory: Normal respiratory effort, no increased work of breathing. Neurologic: Grossly intact, no focal deficits, moving all 4 extremities. Psychiatric: Normal mood and affect.   Assessment & Plan:    1. ED - We discussed intra-urethral suppositories, intracavernous vasoactive drug injection therapy, vacuum erection devices, LI-ESWT and penile prosthesis implantation  -he would like to continue with the tadalafil 20 mg, on-demand-dosing -I did explain to him that the Muse looks like it is covered by his insurance and may be a good option for him, he deferred -I gave more info regarding the MUSE on his AVS   Return for follow up in July 2024 for PSA, I PSS, SHIM and exam  .    [name]  [facility] [address] [place], [place] [address] [phone]

## 2022-09-05 ENCOUNTER — Ambulatory Visit: Payer: BC Managed Care – PPO | Admitting: Urology

## 2022-09-05 ENCOUNTER — Encounter: Payer: Self-pay | Admitting: Urology

## 2022-09-05 VITALS — BP 129/87 | HR 55 | Ht 66.0 in | Wt 185.0 lb

## 2022-09-05 DIAGNOSIS — N5201 Erectile dysfunction due to arterial insufficiency: Secondary | ICD-10-CM

## 2022-09-05 DIAGNOSIS — Z125 Encounter for screening for malignant neoplasm of prostate: Secondary | ICD-10-CM

## 2022-09-07 ENCOUNTER — Other Ambulatory Visit (INDEPENDENT_AMBULATORY_CARE_PROVIDER_SITE_OTHER): Payer: Self-pay | Admitting: Vascular Surgery

## 2022-09-07 DIAGNOSIS — I6523 Occlusion and stenosis of bilateral carotid arteries: Secondary | ICD-10-CM

## 2022-09-08 ENCOUNTER — Ambulatory Visit (INDEPENDENT_AMBULATORY_CARE_PROVIDER_SITE_OTHER): Payer: BC Managed Care – PPO | Admitting: Vascular Surgery

## 2022-09-08 ENCOUNTER — Encounter (INDEPENDENT_AMBULATORY_CARE_PROVIDER_SITE_OTHER): Payer: BC Managed Care – PPO

## 2022-09-08 ENCOUNTER — Ambulatory Visit (INDEPENDENT_AMBULATORY_CARE_PROVIDER_SITE_OTHER): Payer: BC Managed Care – PPO

## 2022-09-08 ENCOUNTER — Encounter (INDEPENDENT_AMBULATORY_CARE_PROVIDER_SITE_OTHER): Payer: Self-pay | Admitting: Vascular Surgery

## 2022-09-08 VITALS — BP 136/80 | HR 60 | Resp 16 | Wt 189.4 lb

## 2022-09-08 DIAGNOSIS — I6521 Occlusion and stenosis of right carotid artery: Secondary | ICD-10-CM

## 2022-09-08 DIAGNOSIS — E782 Mixed hyperlipidemia: Secondary | ICD-10-CM

## 2022-09-08 DIAGNOSIS — I10 Essential (primary) hypertension: Secondary | ICD-10-CM

## 2022-09-08 DIAGNOSIS — I6523 Occlusion and stenosis of bilateral carotid arteries: Secondary | ICD-10-CM

## 2022-09-08 NOTE — Progress Notes (Signed)
MRN : [id number]  Nicholas Torres is a 61 y.o. (Aug 18, 1961) male who presents with chief complaint of  Chief Complaint  Patient presents with   Follow-up    Ultrasound follow up  .  History of Present Illness: Patient returns in follow-up of his carotid disease.  He is over 2 years status post right carotid endarterectomy for high-grade stenosis.  He has no focal neurologic symptoms.  He is doing well from a carotid standpoint.  He apparently saw his cardiologist recently who recommended he resume Plavix for some recurrent coronary artery disease after previous four-vessel CABG.  He really does not want to resume Plavix and is trying to explain to me why he does not need the Plavix today.  His carotid duplex shows a patent right carotid endarterectomy with 1 to 39% left ICA stenosis.  Current Outpatient Medications  Medication Sig Dispense Refill   AMBULATORY NON FORMULARY MEDICATION Trimix (30/1/10)-(Pap/Phent/PGE)  Test Dose  12m vial   Qty #3 Refills 0  [phone] [phone] mL 0   ASPIRIN LOW DOSE 81 MG EC tablet Take 81 mg by mouth daily.     clopidogrel (PLAVIX) 75 MG tablet Take 75 mg by mouth daily.     cyclobenzaprine (FLEXERIL) 10 MG tablet TAKE 1 TABLET BY MOUTH AT BEDTIME FOR NECK SPASMS 30 tablet 2   fluticasone (FLONASE) 50 MCG/ACT nasal spray PLACE 1 SPRAY INTO BOTH NOSTRILS DAILY AS NEEDED. 48 mL 1   levothyroxine (SYNTHROID) 25 MCG tablet TAKE 1 TAB DAILY BEFORE BREAKFAST ON EMPTY STOMACH, WAIT 30 MINUTES AFTER TAKING MEDICATION TO EAT OR TAKE OTHER MEDICATIONS. 90 tablet 0   lisinopril (ZESTRIL) 10 MG tablet Take 1 tablet by mouth daily.     lisinopril (ZESTRIL) 20 MG tablet TAKE 1 TABLET (20 MG TOTAL) BY MOUTH DAILY. THIS IS INCREASED DOSE 90 tablet 1   METOPROLOL TARTRATE PO Take 25 mg by mouth daily. Pt takes 1/2 tablet daily     omeprazole (PRILOSEC) 20 MG capsule TAKE 1 CAPSULE BY MOUTH EVERY DAY 90 capsule 1   rosuvastatin  (CRESTOR) 40 MG tablet TAKE 1 TABLET BY MOUTH EVERY DAY IN THE MORNING 90 tablet 1   tadalafil (CIALIS) 20 MG tablet Take 1 tablet (20 mg total) by mouth daily as needed for erectile dysfunction. 30 tablet 3   No current facility-administered medications for this visit.    Past Medical History:  Diagnosis Date   Arthritis    back   CAD (coronary artery disease) 2017   CHF (congestive heart failure) (HCC)    High cholesterol    Hypertension    Myocardial infarction ([facility] 2017   Pollen allergy     Past Surgical History:  Procedure Laterality Date   CARDIAC CATHETERIZATION Right 07/24/2016   Procedure: Left Heart Cath and Coronary Angiography;  Surgeon: SDionisio David MD;  [place] [facility];  Service: Cardiovascular;  Laterality: Right;   CORONARY ARTERY BYPASS GRAFT N/A 07/26/2016   Procedure: CORONARY ARTERY BYPASS GRAFTING (CABG), ON PUMP, TIMES FOUR, USING BILATERAL INTERNAL MAMMARY ARTERIES, RIGHT GREATER SAPHENOUS VEIN HARVESTED ENDOSCOPICALLY;  Surgeon: SMelrose Nakayama MD;  [place]  Service: Open Heart Surgery;  Laterality: N/A;   ENDARTERECTOMY Right 01/22/2020   Procedure: ENDARTERECTOMY CAROTID;  Surgeon: DAlgernon Huxley MD;  Location: [facility];  Service: Vascular;  Laterality: Right;   INGUINAL HERNIA REPAIR Right 04/16/2020   Procedure: HERNIA REPAIR INGUINAL ADULT;  Surgeon: CFredirick Maudlin  MD;  Location: [facility];  Service: General;  Laterality: Right;   RADIAL ARTERY HARVEST Left 07/26/2016   Procedure: RADIAL LEFT ARTERY HARVEST;  Surgeon: Melrose Nakayama, MD;  Location: [place];  Service: Open Heart Surgery;  Laterality: Left;   TEE WITHOUT CARDIOVERSION N/A 07/26/2016   Procedure: TRANSESOPHAGEAL ECHOCARDIOGRAM (TEE);  Surgeon: Melrose Nakayama, MD;  Location: [place];  Service: Open Heart Surgery;  Laterality: N/A;     Social History   Tobacco Use   Smoking status: Never    Passive exposure: Never   Smokeless tobacco: Never  Vaping  Use   Vaping Use: Never used  Substance Use Topics   Alcohol use: No   Drug use: No      Family History  Problem Relation Age of Onset   Diabetes Mother    Heart attack Father    CAD Brother      No Known Allergies   REVIEW OF SYSTEMS (Negative unless checked)  Constitutional: '[]'$ Weight loss  '[]'$ Fever  '[]'$ Chills Cardiac: '[]'$ Chest pain   '[]'$ Chest pressure   '[]'$ Palpitations   '[]'$ Shortness of breath when laying flat   '[]'$ Shortness of breath at rest   '[]'$ Shortness of breath with exertion. Vascular:  '[]'$ Pain in legs with walking   '[]'$ Pain in legs at rest   '[]'$ Pain in legs when laying flat   '[]'$ Claudication   '[]'$ Pain in feet when walking  '[]'$ Pain in feet at rest  '[]'$ Pain in feet when laying flat   '[]'$ History of DVT   '[]'$ Phlebitis   '[]'$ Swelling in legs   '[]'$ Varicose veins   '[]'$ Non-healing ulcers Pulmonary:   '[]'$ Uses home oxygen   '[]'$ Productive cough   '[]'$ Hemoptysis   '[]'$ Wheeze  '[]'$ COPD   '[]'$ Asthma Neurologic:  '[]'$ Dizziness  '[]'$ Blackouts   '[]'$ Seizures   '[]'$ History of stroke   '[]'$ History of TIA  '[]'$ Aphasia   '[]'$ Temporary blindness   '[]'$ Dysphagia   '[]'$ Weakness or numbness in arms   '[]'$ Weakness or numbness in legs Musculoskeletal:  '[]'$ Arthritis   '[]'$ Joint swelling   '[]'$ Joint pain   '[x]'$ Low back pain Hematologic:  '[]'$ Easy bruising  '[]'$ Easy bleeding   '[]'$ Hypercoagulable state   '[]'$ Anemic  '[]'$ Hepatitis Gastrointestinal:  '[]'$ Blood in stool   '[]'$ Vomiting blood  '[]'$ Gastroesophageal reflux/heartburn   '[]'$ Difficulty swallowing. Genitourinary:  '[]'$ Chronic kidney disease   '[]'$ Difficult urination  '[]'$ Frequent urination  '[]'$ Burning with urination   '[]'$ Blood in urine Skin:  '[]'$ Rashes   '[]'$ Ulcers   '[]'$ Wounds Psychological:  '[]'$ History of anxiety   '[]'$  History of major depression.  Physical Examination  Vitals:   09/08/22 1016  BP: 136/80  Pulse: 60  Resp: 16  Weight: 189 lb 6.4 oz (85.9 kg)   Body mass index is 30.57 kg/m. Gen:  WD/WN, NAD Head: [place]/AT, No temporalis wasting. Ear/Nose/Throat: Hearing grossly intact, nares w/o erythema or drainage,  trachea midline Eyes: Conjunctiva clear. Sclera non-icteric Neck: Supple.  No bruit  Pulmonary:  Good air movement, equal and clear to auscultation bilaterally.  Cardiac: RRR, No JVD Vascular:  Vessel Right Left  Radial Palpable Palpable           Musculoskeletal: M/S 5/5 throughout.  No deformity or atrophy.  No edema. Neurologic: CN 2-12 intact. Sensation grossly intact in extremities.  Symmetrical.  Speech is fluent. Motor exam as listed above. Psychiatric: Judgment intact, Mood & affect appropriate for pt's clinical situation. Dermatologic: No rashes or ulcers noted.  No cellulitis or open wounds.     CBC Lab Results  Component Value Date   WBC 4.8 11/11/2020  HGB 13.8 05/29/2022   HCT 42.0 05/29/2022   MCV 93.8 11/11/2020   PLT 130 (L) 11/11/2020    BMET    Component Value Date/Time   NA 141 11/11/2020 0808   K 3.2 (L) 11/11/2020 0808   CL 104 11/11/2020 0808   CO2 31 11/11/2020 0808   GLUCOSE 86 11/11/2020 0808   BUN 18 11/11/2020 0808   CREATININE 1.24 11/11/2020 0808   CALCIUM 9.3 11/11/2020 0808   GFRNONAA 64 11/11/2020 0808   GFRAA 74 11/11/2020 0808   CrCl cannot be calculated (Patient's most recent lab result is older than the maximum 21 days allowed.).  COAG Lab Results  Component Value Date   INR 1.1 01/20/2020   INR 1.18 08/11/2016   INR 1.55 07/26/2016    Radiology No results found.   Assessment/Plan Carotid stenosis, right Carotid duplex shows a widely patent right carotid endarterectomy with minimal left carotid stenosis on the lower end of the 1 to 39% range.  Continue aspirin and Crestor.  He has stopped Plavix which is okay from a vascular standpoint, but it sounds like he should be on it from a cardiac standpoint.  Recheck in 1 year.   Essential hypertension blood pressure control important in reducing the progression of atherosclerotic disease. On appropriate oral medications.   Leotis Pain, MD  09/08/2022 10:18 AM    This  note was created with Dragon medical transcription system.  Any errors from dictation are purely unintentional

## 2022-09-11 ENCOUNTER — Encounter (INDEPENDENT_AMBULATORY_CARE_PROVIDER_SITE_OTHER): Payer: Self-pay

## 2022-09-22 ENCOUNTER — Ambulatory Visit: Payer: BC Managed Care – PPO | Admitting: Physician Assistant

## 2022-09-29 ENCOUNTER — Ambulatory Visit
Admission: RE | Admit: 2022-09-29 | Discharge: 2022-09-29 | Disposition: A | Discharge status: Home / Self Care | Payer: BC Managed Care – PPO | Source: Ambulatory Visit | Attending: Physician Assistant | Admitting: Physician Assistant

## 2022-09-29 DIAGNOSIS — R1310 Dysphagia, unspecified: Secondary | ICD-10-CM | POA: Diagnosis present

## 2022-09-29 NOTE — Progress Notes (Signed)
Modified Barium Swallow Progress Note  Patient Details  Name: Nicholas Torres MRN: [id number] Date of Birth: 01/02/1961  Today's Date: 09/29/2022  Modified Barium Swallow completed.  Full report located under Chart Review in the Imaging Section.  Brief recommendations include the following:  Clinical Impression  Pt presents with adequate oropharyngeal abilities when consuming thin liquids via cup, graham crackers coated with barium and whole barium tablet with thin liquids via cup. Pt also reports being free of any s/s of discomfort when swallowing. At this time, recommend regular diet with thin liquids, medicine whole with thin liquids. Pt voiced relief and understanding.   Swallow Evaluation Recommendations       SLP Diet Recommendations: Regular solids;Thin liquid   Liquid Administration via: Cup;Straw   Medication Administration: Whole meds with liquid   Supervision: Patient able to self feed   Compensations: Minimize environmental distractions;Slow rate;Small sips/bites   Postural Changes: Remain semi-upright after after feeds/meals (Comment);Seated upright at 90 degrees   Oral Care Recommendations: Oral care BID      Melis Trochez B. Rutherford Nail, M.S., CCC-SLP, Mining engineer Certified Brain Injury [place]  [place] Office [phone] Ascom [phone] Fax [phone]

## 2022-10-09 ENCOUNTER — Ambulatory Visit: Payer: BC Managed Care – PPO | Admitting: Physician Assistant

## 2022-10-09 ENCOUNTER — Encounter: Payer: Self-pay | Admitting: Physician Assistant

## 2022-10-09 VITALS — BP 140/82 | HR 51 | Temp 98.1°F | Resp 16 | Ht 66.0 in | Wt 190.0 lb

## 2022-10-09 DIAGNOSIS — I10 Essential (primary) hypertension: Secondary | ICD-10-CM

## 2022-10-09 DIAGNOSIS — K219 Gastro-esophageal reflux disease without esophagitis: Secondary | ICD-10-CM

## 2022-10-09 DIAGNOSIS — M542 Cervicalgia: Secondary | ICD-10-CM

## 2022-10-09 MED ORDER — PANTOPRAZOLE SODIUM 20 MG PO TBEC: 20.0000 mg | DELAYED_RELEASE_TABLET | Freq: Every day | ORAL | 1 refills | Status: DC

## 2022-10-09 MED ORDER — PANTOPRAZOLE SODIUM 40 MG PO TBEC: 40.0000 mg | DELAYED_RELEASE_TABLET | Freq: Every day | ORAL | 3 refills | Status: DC

## 2022-10-09 MED ORDER — LISINOPRIL 20 MG PO TABS: 20.0000 mg | ORAL_TABLET | Freq: Every day | ORAL | 1 refills | Status: DC

## 2022-10-09 NOTE — Progress Notes (Signed)
[facility] [place], [place] [address]  Internal MEDICINE  Office Visit Note  Patient Name: Nicholas Torres  [id number]  [id number]  Date of Service: 10/18/2022  Chief Complaint  Patient presents with   Follow-up     Swallow test    Quality Metric Gaps    Shingles Vaccines    HPI Pt is here for routine follow up -Taking plavix again, will switch PPI due to potential interaction -Things went well at vascular follow up visit -Swallow study reviewed and was normal which is reasurring -BP at home usually 076A systolic and has been good at other providers' offices  Current Medication: Outpatient Encounter Medications as of 10/09/2022  Medication Sig   AMBULATORY NON FORMULARY MEDICATION Trimix (30/1/10)-(Pap/Phent/PGE)  Test Dose  25m vial   Qty #3 Refills 0  [phone] [phone]  ASPIRIN LOW DOSE 81 MG EC tablet Take 81 mg by mouth daily.   clopidogrel (PLAVIX) 75 MG tablet Take 75 mg by mouth daily.   cyclobenzaprine (FLEXERIL) 10 MG tablet TAKE 1 TABLET BY MOUTH AT BEDTIME FOR NECK SPASMS   fluticasone (FLONASE) 50 MCG/ACT nasal spray PLACE 1 SPRAY INTO BOTH NOSTRILS DAILY AS NEEDED.   levothyroxine (SYNTHROID) 25 MCG tablet TAKE 1 TAB DAILY BEFORE BREAKFAST ON EMPTY STOMACH, WAIT 30 MINUTES AFTER TAKING MEDICATION TO EAT OR TAKE OTHER MEDICATIONS.   METOPROLOL TARTRATE PO Take 25 mg by mouth daily. Pt takes 1/2 tablet daily   pantoprazole (PROTONIX) 20 MG tablet Take 1 tablet (20 mg total) by mouth daily.   rosuvastatin (CRESTOR) 40 MG tablet TAKE 1 TABLET BY MOUTH EVERY DAY IN THE MORNING   tadalafil (CIALIS) 20 MG tablet Take 1 tablet (20 mg total) by mouth daily as needed for erectile dysfunction.   [DISCONTINUED] lisinopril (ZESTRIL) 10 MG tablet Take 1 tablet by mouth daily.   [DISCONTINUED] lisinopril (ZESTRIL) 20 MG tablet TAKE 1 TABLET (20 MG TOTAL) BY MOUTH DAILY. THIS IS INCREASED DOSE   [DISCONTINUED]  omeprazole (PRILOSEC) 20 MG capsule TAKE 1 CAPSULE BY MOUTH EVERY DAY   [DISCONTINUED] pantoprazole (PROTONIX) 40 MG tablet Take 1 tablet (40 mg total) by mouth daily.   lisinopril (ZESTRIL) 20 MG tablet Take 1 tablet (20 mg total) by mouth daily. THIS IS INCREASED DOSE   No facility-administered encounter medications on file as of 10/09/2022.    Surgical History: Past Surgical History:  Procedure Laterality Date   CARDIAC CATHETERIZATION Right 07/24/2016   Procedure: Left Heart Cath and Coronary Angiography;  Surgeon: SDionisio David MD;  [facility];  Service: Cardiovascular;  Laterality: Right;   CORONARY ARTERY BYPASS GRAFT N/A 07/26/2016   Procedure: CORONARY ARTERY BYPASS GRAFTING (CABG), ON PUMP, TIMES FOUR, USING BILATERAL INTERNAL MAMMARY ARTERIES, RIGHT GREATER SAPHENOUS VEIN HARVESTED ENDOSCOPICALLY;  Surgeon: SMelrose Nakayama MD;  [place]  Service: Open Heart Surgery;  Laterality: N/A;   ENDARTERECTOMY Right 01/22/2020   Procedure: ENDARTERECTOMY CAROTID;  Surgeon: DAlgernon Huxley MD;  Location: [facility];  Service: Vascular;  Laterality: Right;   INGUINAL HERNIA REPAIR Right 04/16/2020   Procedure: HERNIA REPAIR INGUINAL ADULT;  Surgeon: CFredirick Maudlin MD;  Location: [facility];  Service: General;  Laterality: Right;   RADIAL ARTERY HARVEST Left 07/26/2016   Procedure: RADIAL LEFT ARTERY HARVEST;  Surgeon: SMelrose Nakayama MD;  [place]  Service: Open Heart Surgery;  Laterality: Left;   TEE WITHOUT CARDIOVERSION N/A 07/26/2016   Procedure: TRANSESOPHAGEAL  ECHOCARDIOGRAM (TEE);  Surgeon: Melrose Nakayama, MD;  Location: [place];  Service: Open Heart Surgery;  Laterality: N/A;    Medical History: Past Medical History:  Diagnosis Date   Arthritis    back   CAD (coronary artery disease) 2017   CHF (congestive heart failure) (HCC)    High cholesterol    Hypertension    Myocardial infarction ([place]) 2017   Pollen allergy     Family  History: Family History  Problem Relation Age of Onset   Diabetes Mother    Heart attack Father    CAD Brother     Social History   Socioeconomic History   Marital status: Married    Spouse name: Not on file   Number of children: Not on file   Years of education: Not on file   Highest education level: Not on file  Occupational History   Not on file  Tobacco Use   Smoking status: Never    Passive exposure: Never   Smokeless tobacco: Never  Vaping Use   Vaping Use: Never used  Substance and Sexual Activity   Alcohol use: No   Drug use: No   Sexual activity: Yes  Other Topics Concern   Not on file  Social History Narrative   Not on file   Social Determinants of Health   Financial Resource Strain: Not on file  Food Insecurity: Not on file  Transportation Needs: Not on file  Physical Activity: Not on file  Stress: Not on file  Social Connections: Not on file  Intimate Partner Violence: Not on file      Review of Systems  Constitutional:  Negative for chills, fatigue and unexpected weight change.  HENT:  Positive for tinnitus. Negative for congestion, postnasal drip, rhinorrhea, sneezing and sore throat.   Eyes:  Negative for redness.  Respiratory:  Negative for cough, chest tightness and shortness of breath.   Cardiovascular:  Negative for chest pain and palpitations.  Gastrointestinal:  Negative for abdominal pain, constipation, diarrhea, nausea and vomiting.  Genitourinary:  Negative for dysuria and frequency.  Musculoskeletal:  Positive for arthralgias, back pain and neck pain. Negative for joint swelling.  Skin:  Negative for rash.  Neurological: Negative.  Negative for tremors and numbness.  Hematological:  Negative for adenopathy. Does not bruise/bleed easily.  Psychiatric/Behavioral:  Positive for sleep disturbance. Negative for behavioral problems (Depression) and suicidal ideas. The patient is not nervous/anxious.     Vital Signs: BP (!) 140/82  Comment: 142/94  Pulse (!) 51   Temp 98.1 F (36.7 C)   Resp 16   Ht '5\' 6"'$  (1.676 m)   Wt 190 lb (86.2 kg)   SpO2 98%   BMI 30.67 kg/m    Physical Exam Vitals and nursing note reviewed.  Constitutional:      General: He is not in acute distress.    Appearance: Normal appearance. He is well-developed. He is obese. He is not diaphoretic.  HENT:     Head: Normocephalic and atraumatic.     Mouth/Throat:     Pharynx: No oropharyngeal exudate.  Eyes:     Pupils: Pupils are equal, round, and reactive to light.  Neck:     Thyroid: No thyromegaly.     Vascular: No JVD.     Trachea: No tracheal deviation.  Cardiovascular:     Rate and Rhythm: Regular rhythm. Bradycardia present.     Heart sounds: Normal heart sounds. No murmur heard.    No friction rub.  No gallop.  Pulmonary:     Effort: Pulmonary effort is normal. No respiratory distress.     Breath sounds: No wheezing or rales.  Chest:     Chest wall: No tenderness.  Abdominal:     General: Bowel sounds are normal.     Palpations: Abdomen is soft.  Musculoskeletal:        General: Normal range of motion.     Cervical back: Normal range of motion and neck supple.  Lymphadenopathy:     Cervical: No cervical adenopathy.  Skin:    General: Skin is warm and dry.  Neurological:     Mental Status: He is alert and oriented to person, place, and time.     Cranial Nerves: No cranial nerve deficit.  Psychiatric:        Behavior: Behavior normal.        Thought Content: Thought content normal.        Judgment: Judgment normal.        Assessment/Plan: 1. Uncontrolled hypertension Well controlled at home, will continue current medication and may take extra 1/2 tab lisinopril for '30mg'$  total if rising. - lisinopril (ZESTRIL) 20 MG tablet; Take 1 tablet (20 mg total) by mouth daily. THIS IS INCREASED DOSE  Dispense: 90 tablet; Refill: 1  2. Gastroesophageal reflux disease, unspecified whether esophagitis present Will switch to  pantoprazole instead of omeprazole due to potential interaction with plavix   General Counseling: [place] verbalizes understanding of the findings of todays visit and agrees with plan of treatment. I have discussed any further diagnostic evaluation that may be needed or ordered today. We also reviewed his medications today. he has been encouraged to call the office with any questions or concerns that should arise related to todays visit.    No orders of the defined types were placed in this encounter.   Meds ordered this encounter  Medications   lisinopril (ZESTRIL) 20 MG tablet    Sig: Take 1 tablet (20 mg total) by mouth daily. THIS IS INCREASED DOSE    Dispense:  90 tablet    Refill:  1   DISCONTD: pantoprazole (PROTONIX) 40 MG tablet    Sig: Take 1 tablet (40 mg total) by mouth daily.    Dispense:  30 tablet    Refill:  3   pantoprazole (PROTONIX) 20 MG tablet    Sig: Take 1 tablet (20 mg total) by mouth daily.    Dispense:  90 tablet    Refill:  1    Change from omeprazole due to plavix interaction    This patient was seen by Drema Dallas, PA-C in collaboration with Dr. Clayborn Bigness as a part of collaborative care agreement.   Total time spent:30 Minutes Time spent includes review of chart, medications, test results, and follow up plan with the patient.      Dr Lavera Guise Internal medicine

## 2022-11-01 ENCOUNTER — Other Ambulatory Visit: Payer: Self-pay | Admitting: Physician Assistant

## 2022-11-01 DIAGNOSIS — K219 Gastro-esophageal reflux disease without esophagitis: Secondary | ICD-10-CM

## 2022-11-02 ENCOUNTER — Other Ambulatory Visit: Payer: Self-pay | Admitting: Physician Assistant

## 2022-11-02 DIAGNOSIS — K219 Gastro-esophageal reflux disease without esophagitis: Secondary | ICD-10-CM

## 2022-12-03 ENCOUNTER — Other Ambulatory Visit: Payer: Self-pay | Admitting: Physician Assistant

## 2022-12-03 DIAGNOSIS — M542 Cervicalgia: Secondary | ICD-10-CM

## 2022-12-21 ENCOUNTER — Ambulatory Visit: Payer: BC Managed Care – PPO | Admitting: Cardiovascular Disease

## 2022-12-21 ENCOUNTER — Encounter: Payer: Self-pay | Admitting: Cardiovascular Disease

## 2022-12-21 VITALS — BP 130/80 | HR 55 | Ht 66.0 in | Wt 191.4 lb

## 2022-12-21 DIAGNOSIS — I214 Non-ST elevation (NSTEMI) myocardial infarction: Secondary | ICD-10-CM | POA: Diagnosis not present

## 2022-12-21 DIAGNOSIS — I25118 Atherosclerotic heart disease of native coronary artery with other forms of angina pectoris: Secondary | ICD-10-CM | POA: Diagnosis not present

## 2022-12-21 DIAGNOSIS — R0602 Shortness of breath: Secondary | ICD-10-CM | POA: Diagnosis not present

## 2022-12-21 DIAGNOSIS — Z951 Presence of aortocoronary bypass graft: Secondary | ICD-10-CM

## 2022-12-21 DIAGNOSIS — I10 Essential (primary) hypertension: Secondary | ICD-10-CM

## 2022-12-21 DIAGNOSIS — I2510 Atherosclerotic heart disease of native coronary artery without angina pectoris: Secondary | ICD-10-CM

## 2022-12-21 DIAGNOSIS — I2583 Coronary atherosclerosis due to lipid rich plaque: Secondary | ICD-10-CM

## 2022-12-21 DIAGNOSIS — I6521 Occlusion and stenosis of right carotid artery: Secondary | ICD-10-CM

## 2022-12-21 NOTE — Patient Instructions (Signed)
Increase spironolactone to whole tab or 25 mg daily

## 2022-12-21 NOTE — Progress Notes (Signed)
Cardiology Office Note   Date:  12/21/2022   ID:  Nicholas Torres, DOB [birth year], MRN [id number]  PCP:  Mylinda Latina, PA-C  Cardiologist:  Neoma Laming, MD      History of Present Illness: Nicholas Torres is a 62 y.o. male who presents for  Chief Complaint  Patient presents with   Follow-up    1 mo fu    Chest Pain  This is a chronic problem. The current episode started 1 to 4 weeks ago. The onset quality is gradual. The problem has been resolved.  Breathing Problem He complains of difficulty breathing. Associated symptoms include chest pain.      Past Medical History:  Diagnosis Date   Arthritis    back   CAD (coronary artery disease) 2017   CHF (congestive heart failure) (HCC)    High cholesterol    Hypertension    Myocardial infarction [facility]) 2017   Pollen allergy      Past Surgical History:  Procedure Laterality Date   CARDIAC CATHETERIZATION Right 07/24/2016   Procedure: Left Heart Cath and Coronary Angiography;  Surgeon: Dionisio David, MD;  Location: [place] [facility];  Service: Cardiovascular;  Laterality: Right;   CORONARY ARTERY BYPASS GRAFT N/A 07/26/2016   Procedure: CORONARY ARTERY BYPASS GRAFTING (CABG), ON PUMP, TIMES FOUR, USING BILATERAL INTERNAL MAMMARY ARTERIES, RIGHT GREATER SAPHENOUS VEIN HARVESTED ENDOSCOPICALLY;  Surgeon: Melrose Nakayama, MD;  Location: [place];  Service: Open Heart Surgery;  Laterality: N/A;   ENDARTERECTOMY Right 01/22/2020   Procedure: ENDARTERECTOMY CAROTID;  Surgeon: Algernon Huxley, MD;  Location: [facility];  Service: Vascular;  Laterality: Right;   INGUINAL HERNIA REPAIR Right 04/16/2020   Procedure: HERNIA REPAIR INGUINAL ADULT;  Surgeon: Fredirick Maudlin, MD;  Location: [facility];  Service: General;  Laterality: Right;   RADIAL ARTERY HARVEST Left 07/26/2016   Procedure: RADIAL LEFT ARTERY HARVEST;  Surgeon: Melrose Nakayama, MD;  Location: [place];  Service: Open Heart Surgery;  Laterality: Left;   TEE WITHOUT  CARDIOVERSION N/A 07/26/2016   Procedure: TRANSESOPHAGEAL ECHOCARDIOGRAM (TEE);  Surgeon: Melrose Nakayama, MD;  Location: [place];  Service: Open Heart Surgery;  Laterality: N/A;     Current Outpatient Medications  Medication Sig Dispense Refill   ASPIRIN LOW DOSE 81 MG EC tablet Take 81 mg by mouth daily.     clopidogrel (PLAVIX) 75 MG tablet Take 75 mg by mouth daily.     cyclobenzaprine (FLEXERIL) 10 MG tablet TAKE 1 TABLET BY MOUTH AT BEDTIME FOR NECK SPASMS 30 tablet 2   fluticasone (FLONASE) 50 MCG/ACT nasal spray PLACE 1 SPRAY INTO BOTH NOSTRILS DAILY AS NEEDED. 48 mL 1   levothyroxine (SYNTHROID) 25 MCG tablet TAKE 1 TAB DAILY BEFORE BREAKFAST ON EMPTY STOMACH, WAIT 30 MINUTES AFTER TAKING MEDICATION TO EAT OR TAKE OTHER MEDICATIONS. 90 tablet 0   lisinopril (ZESTRIL) 20 MG tablet Take 1 tablet (20 mg total) by mouth daily. THIS IS INCREASED DOSE 90 tablet 1   metoprolol succinate (TOPROL-XL) 25 MG 24 hr tablet Take 12.5 mg by mouth daily.     pantoprazole (PROTONIX) 20 MG tablet Take 1 tablet (20 mg total) by mouth daily. 90 tablet 1   rosuvastatin (CRESTOR) 40 MG tablet TAKE 1 TABLET BY MOUTH EVERY DAY IN THE MORNING 90 tablet 1   spironolactone (ALDACTONE) 25 MG tablet Take 25 mg by mouth daily.     tadalafil (CIALIS) 20 MG tablet Take 1 tablet (20 mg total) by  mouth daily as needed for erectile dysfunction. 30 tablet 3   AMBULATORY NON FORMULARY MEDICATION Trimix (30/1/10)-(Pap/Phent/PGE)  Test Dose  65m vial   Qty #3 [phone] [phone](Patient not taking: Reported on 12/21/2022) 3 mL 0   No current facility-administered medications for this visit.    Allergies:   Patient has no known allergies.    Social History:   reports that he has never smoked. He has never been exposed to tobacco smoke. He has never used smokeless tobacco. He reports that he does not drink alcohol and does not use drugs.   Family History:  family  history includes CAD in his brother; Diabetes in his mother; Heart attack in his father.    ROS:     Review of Systems  Constitutional: Negative.   HENT: Negative.    Eyes: Negative.   Respiratory: Negative.    Cardiovascular:  Positive for chest pain.  Gastrointestinal: Negative.   Genitourinary: Negative.   Musculoskeletal: Negative.   Skin: Negative.   Neurological: Negative.   Endo/Heme/Allergies: Negative.   Psychiatric/Behavioral: Negative.    All other systems reviewed and are negative.     All other systems are reviewed and negative.    PHYSICAL EXAM: VS:  BP 130/80 (BP Location: Right Arm, Patient Position: Sitting, Cuff Size: Normal)   Pulse (!) 55   Ht '5\' 6"'$  (1.676 m)   Wt 191 lb 6.4 oz (86.8 kg)   SpO2 100%   BMI 30.89 kg/m  , BMI Body mass index is 30.89 kg/m. Last weight:  Wt Readings from Last 3 Encounters:  12/21/22 191 lb 6.4 oz (86.8 kg)  10/09/22 190 lb (86.2 kg)  09/08/22 189 lb 6.4 oz (85.9 kg)     Physical Exam Vitals reviewed.  Constitutional:      Appearance: Normal appearance. He is normal weight.  HENT:     Head: Normocephalic.     Nose: Nose normal.     Mouth/Throat:     Mouth: Mucous membranes are moist.  Eyes:     Pupils: Pupils are equal, round, and reactive to light.  Cardiovascular:     Rate and Rhythm: Normal rate and regular rhythm.     Pulses: Normal pulses.     Heart sounds: Normal heart sounds.  Pulmonary:     Effort: Pulmonary effort is normal.  Abdominal:     General: Abdomen is flat. Bowel sounds are normal.  Musculoskeletal:        General: Normal range of motion.     Cervical back: Normal range of motion.  Skin:    General: Skin is warm.  Neurological:     General: No focal deficit present.     Mental Status: He is alert.  Psychiatric:        Mood and Affect: Mood normal.       EKG:   Recent Labs: 05/29/2022: Hemoglobin 13.8    Lipid Panel    Component Value Date/Time   CHOL 111 11/11/2020 0808    TRIG 74 11/11/2020 0808   HDL 58 11/11/2020 0808   CHOLHDL 1.9 11/11/2020 0808   VLDL 17 07/24/2016 0612   LDLCALC 38 11/11/2020 0808      Other studies Reviewed: Additional studies/ records that were reviewed today include:  Review of the above records demonstrates:       No data to display            ASSESSMENT AND PLAN:  ICD-10-CM   1. SOB (shortness of breath)  R06.02    Patient has still SOB and may have HEpEF, thus aldacone inreased to 25 mg daily    2. Coronary artery disease of native artery of native heart with stable angina pectoris ([place])  I25.118     3. NSTEMI (non-ST elevated myocardial infarction) ([place])  I21.4     4. Coronary artery disease due to lipid rich plaque  I25.10    I25.83     5. Essential hypertension  I10     6. Stenosis of right carotid artery  I65.21     7. S/P CABG x 4  Z95.1        Problem List Items Addressed This Visit       Cardiovascular and Mediastinum   NSTEMI (non-ST elevated myocardial infarction) (HCC)   Relevant Medications   spironolactone (ALDACTONE) 25 MG tablet   metoprolol succinate (TOPROL-XL) 25 MG 24 hr tablet   CAD (coronary artery disease), native coronary artery   Relevant Medications   spironolactone (ALDACTONE) 25 MG tablet   metoprolol succinate (TOPROL-XL) 25 MG 24 hr tablet   CAD (coronary artery disease)   Relevant Medications   spironolactone (ALDACTONE) 25 MG tablet   metoprolol succinate (TOPROL-XL) 25 MG 24 hr tablet   Essential hypertension   Relevant Medications   spironolactone (ALDACTONE) 25 MG tablet   metoprolol succinate (TOPROL-XL) 25 MG 24 hr tablet   Carotid stenosis   Relevant Medications   spironolactone (ALDACTONE) 25 MG tablet   metoprolol succinate (TOPROL-XL) 25 MG 24 hr tablet     Other   S/P CABG x 4   SOB (shortness of breath) - Primary       Disposition:   Return in about 4 weeks (around 01/18/2023).     Signed,  Neoma Laming, MD  12/21/2022 10:09 AM     [facility]

## 2023-01-01 ENCOUNTER — Encounter: Payer: BC Managed Care – PPO | Admitting: Physician Assistant

## 2023-01-12 ENCOUNTER — Encounter: Payer: BC Managed Care – PPO | Admitting: Physician Assistant

## 2023-01-18 ENCOUNTER — Ambulatory Visit (INDEPENDENT_AMBULATORY_CARE_PROVIDER_SITE_OTHER): Payer: BC Managed Care – PPO | Admitting: Cardiovascular Disease

## 2023-01-18 ENCOUNTER — Ambulatory Visit (INDEPENDENT_AMBULATORY_CARE_PROVIDER_SITE_OTHER): Payer: BC Managed Care – PPO

## 2023-01-18 ENCOUNTER — Encounter: Payer: Self-pay | Admitting: Cardiovascular Disease

## 2023-01-18 VITALS — BP 132/78 | HR 78 | Ht 66.0 in | Wt 188.6 lb

## 2023-01-18 DIAGNOSIS — I25712 Atherosclerosis of autologous vein coronary artery bypass graft(s) with refractory angina pectoris: Secondary | ICD-10-CM | POA: Diagnosis not present

## 2023-01-18 DIAGNOSIS — I10 Essential (primary) hypertension: Secondary | ICD-10-CM

## 2023-01-18 DIAGNOSIS — I214 Non-ST elevation (NSTEMI) myocardial infarction: Secondary | ICD-10-CM

## 2023-01-18 DIAGNOSIS — R0602 Shortness of breath: Secondary | ICD-10-CM

## 2023-01-18 DIAGNOSIS — I6521 Occlusion and stenosis of right carotid artery: Secondary | ICD-10-CM

## 2023-01-18 DIAGNOSIS — R051 Acute cough: Secondary | ICD-10-CM

## 2023-01-18 NOTE — Progress Notes (Signed)
Cardiology Office Note   Date:  01/18/2023   ID:  Nicholas Torres, DOB [birth year], MRN [id number]  PCP:  Mylinda Latina, PA-C  Cardiologist:  Neoma Laming, MD      History of Present Illness: Nicholas Torres is a 62 y.o. male who presents for  Chief Complaint  Patient presents with   Follow-up    4 week follow up     Cough This is a recurrent problem. The current episode started 1 to 4 weeks ago. The problem has been rapidly worsening. The cough is Productive of brown sputum and productive of sputum. Pertinent negatives include no fever, headaches, nasal congestion or sore throat. Exacerbated by: at night. The treatment provided no relief.      Past Medical History:  Diagnosis Date   Arthritis    back   CAD (coronary artery disease) 2017   CHF (congestive heart failure) (HCC)    High cholesterol    Hypertension    Myocardial infarction [facility]) 2017   Pollen allergy      Past Surgical History:  Procedure Laterality Date   CARDIAC CATHETERIZATION Right 07/24/2016   Procedure: Left Heart Cath and Coronary Angiography;  Surgeon: Dionisio David, MD;  Location: [place] [facility];  Service: Cardiovascular;  Laterality: Right;   CORONARY ARTERY BYPASS GRAFT N/A 07/26/2016   Procedure: CORONARY ARTERY BYPASS GRAFTING (CABG), ON PUMP, TIMES FOUR, USING BILATERAL INTERNAL MAMMARY ARTERIES, RIGHT GREATER SAPHENOUS VEIN HARVESTED ENDOSCOPICALLY;  Surgeon: Melrose Nakayama, MD;  Location: [place];  Service: Open Heart Surgery;  Laterality: N/A;   ENDARTERECTOMY Right 01/22/2020   Procedure: ENDARTERECTOMY CAROTID;  Surgeon: Algernon Huxley, MD;  Location: [facility];  Service: Vascular;  Laterality: Right;   INGUINAL HERNIA REPAIR Right 04/16/2020   Procedure: HERNIA REPAIR INGUINAL ADULT;  Surgeon: Fredirick Maudlin, MD;  Location: [facility];  Service: General;  Laterality: Right;   RADIAL ARTERY HARVEST Left 07/26/2016   Procedure: RADIAL LEFT ARTERY HARVEST;  Surgeon: Melrose Nakayama, MD;  Location: [place];  Service: Open Heart Surgery;  Laterality: Left;   TEE WITHOUT CARDIOVERSION N/A 07/26/2016   Procedure: TRANSESOPHAGEAL ECHOCARDIOGRAM (TEE);  Surgeon: Melrose Nakayama, MD;  Location: [place];  Service: Open Heart Surgery;  Laterality: N/A;     Current Outpatient Medications  Medication Sig Dispense Refill   ASPIRIN LOW DOSE 81 MG EC tablet Take 81 mg by mouth daily.     clopidogrel (PLAVIX) 75 MG tablet Take 75 mg by mouth daily.     cyclobenzaprine (FLEXERIL) 10 MG tablet TAKE 1 TABLET BY MOUTH AT BEDTIME FOR NECK SPASMS 30 tablet 2   fluticasone (FLONASE) 50 MCG/ACT nasal spray PLACE 1 SPRAY INTO BOTH NOSTRILS DAILY AS NEEDED. 48 mL 1   levothyroxine (SYNTHROID) 25 MCG tablet TAKE 1 TAB DAILY BEFORE BREAKFAST ON EMPTY STOMACH, WAIT 30 MINUTES AFTER TAKING MEDICATION TO EAT OR TAKE OTHER MEDICATIONS. 90 tablet 0   lisinopril (ZESTRIL) 20 MG tablet Take 1 tablet (20 mg total) by mouth daily. THIS IS INCREASED DOSE 90 tablet 1   metoprolol succinate (TOPROL-XL) 25 MG 24 hr tablet Take 12.5 mg by mouth daily.     pantoprazole (PROTONIX) 20 MG tablet Take 1 tablet (20 mg total) by mouth daily. 90 tablet 1   rosuvastatin (CRESTOR) 40 MG tablet TAKE 1 TABLET BY MOUTH EVERY DAY IN THE MORNING 90 tablet 1   spironolactone (ALDACTONE) 25 MG tablet Take 25 mg by mouth daily.  tadalafil (CIALIS) 20 MG tablet Take 1 tablet (20 mg total) by mouth daily as needed for erectile dysfunction. 30 tablet 3   AMBULATORY NON FORMULARY MEDICATION Trimix (30/1/10)-(Pap/Phent/PGE)  Test Dose  29m vial   Qty #3 [phone] [phone](Patient not taking: Reported on 12/21/2022) 3 mL 0   No current facility-administered medications for this visit.    Allergies:   Patient has no known allergies.    Social History:   reports that he has never smoked. He has never been exposed to tobacco smoke. He has never used smokeless tobacco.  He reports that he does not drink alcohol and does not use drugs.   Family History:  family history includes CAD in his brother; Diabetes in his mother; Heart attack in his father.    ROS:     Review of Systems  Constitutional: Negative.  Negative for fever.  HENT: Negative.  Negative for sore throat.   Eyes: Negative.   Respiratory:  Positive for cough.   Gastrointestinal: Negative.   Genitourinary: Negative.   Musculoskeletal: Negative.   Skin: Negative.   Neurological: Negative.  Negative for headaches.  Endo/Heme/Allergies: Negative.   Psychiatric/Behavioral: Negative.    All other systems reviewed and are negative.     All other systems are reviewed and negative.    PHYSICAL EXAM: VS:  BP 132/78   Pulse 78   Ht '5\' 6"'$  (1.676 m)   Wt 188 lb 9.6 oz (85.5 kg)   SpO2 99%   BMI 30.44 kg/m  , BMI Body mass index is 30.44 kg/m. Last weight:  Wt Readings from Last 3 Encounters:  01/18/23 188 lb 9.6 oz (85.5 kg)  12/21/22 191 lb 6.4 oz (86.8 kg)  10/09/22 190 lb (86.2 kg)     Physical Exam Vitals reviewed.  Constitutional:      Appearance: Normal appearance. He is normal weight.  HENT:     Head: Normocephalic.     Nose: Nose normal.     Mouth/Throat:     Mouth: Mucous membranes are moist.  Eyes:     Pupils: Pupils are equal, round, and reactive to light.  Cardiovascular:     Rate and Rhythm: Normal rate and regular rhythm.     Pulses: Normal pulses.     Heart sounds: Normal heart sounds.  Pulmonary:     Effort: Pulmonary effort is normal.  Abdominal:     General: Abdomen is flat. Bowel sounds are normal.  Musculoskeletal:        General: Normal range of motion.     Cervical back: Normal range of motion.  Skin:    General: Skin is warm.  Neurological:     General: No focal deficit present.     Mental Status: He is alert.  Psychiatric:        Mood and Affect: Mood normal.       EKG:   Recent Labs: 05/29/2022: Hemoglobin 13.8    Lipid Panel     Component Value Date/Time   CHOL 111 11/11/2020 0808   TRIG 74 11/11/2020 0808   HDL 58 11/11/2020 0808   CHOLHDL 1.9 11/11/2020 0808   VLDL 17 07/24/2016 0612   LDLCALC 38 11/11/2020 0808      REASON FOR VISIT  Referred by Dr.Couper Juncaj KHumphrey Rolls        TESTS  Imaging: Computed Tomographic Angiography:  Cardiac multidetector CT was performed paying particular attention to the coronary arteries for the diagnosis of:  Chest Pain. Diagnostic Drugs:  Administered iohexol (Omnipaque) through an antecubital vein and images from the examination were analyzed for the presence and extent of coronary artery disease, using 3D image processing software. 100 mL of non-ionic contrast (Omnipaque) was used.        ASSESSMENT   The mid right coronary artery (RCA) was abnormal:2  Distal right coronary artery (RCA) was abnormal:3  The LIMA origin was visualized To LAD  The SVG origin was visualized To OM and diagnal     TEST CONCLUSIONS  Quality of study: Good  1-Calcium score: 226.1  2-Right dominant system  3-LIMA to LAD,SVG to OM and diagnal are all patent. Small RCA with 50% disease in mid portion. Treat medically.      Neoma Laming MD  Electronically signed by: Neoma Laming     Date: 08/16/2022 09:31 REASON FOR VISIT  Visit for: Echocardiogram/R07.9  Sex:   Male         wt= 187   lbs.  BP=116/68  Height= 65   inches.        TESTS  Imaging: Echocardiogram:  An echocardiogram in (2-d) mode was performed and in Doppler mode with color flow velocity mapping was performed. The aortic valve cusps are abnormal 2.5   cm, flow velocity 0000000  m/s, and systolic calculated mean flow gradient 4  mmHg. Mitral valve diastolic peak flow velocity E .78   m/s and E/A ratio 3.0. Aortic root diameter 4.2   cm. The LVOT internal diameter 3.8   cm and flow velocity was abnormal .123456   m/s. LV systolic dimension XX123456  cm, diastolic 123456 cm, posterior wall thickness 1.45   cm, fractional  shortening 43.7 %, and EF 75.7 %. IVS thickness 1.24   cm. LA dimension 3.6 cm. Mitral Valve has Mild Regurgitation. Tricuspid Valve has Trace Regurgitation.     ASSESSMENT  Technically adequate study.  Normal chamber sizes.  Normal left ventricular systolic function.  Mild left ventricular hypertrophy with GRADE 2 (psuedonormalization) diastolic dysfunction.  Normal right ventricular systolic function.  Normal right ventricular diastolic function.  Normal left ventricular wall motion.  Normal right ventricular wall motion.  Trace tricuspid regurgitation.  Normal pulmonary artery pressure.  Mild mitral regurgitation.  No pericardial effusion.  Mild LVH.     THERAPY   Referring physician: Dionisio David  Sonographer: Neoma Laming.      Neoma Laming MD  Electronically signed by: Neoma Laming     Date: 08/21/2022 10:55 Other studies Reviewed: Additional studies/ records that were reviewed today include:  Review of the above records demonstrates:       No data to display            ASSESSMENT AND PLAN:    ICD-10-CM   1. Acute cough  R05.1 DG Chest 2 View   Takes lisinopril but been on it since 2017, and cough started 1 week bac. Will do CXR, going to see primary at [facility] [place].    2. Coronary artery disease involving autologous vein coronary bypass graft with refractory angina pectoris ([place])  I25.712 DG Chest 2 View    3. Stenosis of right carotid artery  I65.21 DG Chest 2 View    4. Essential hypertension  I10 DG Chest 2 View    5. NSTEMI (non-ST elevated myocardial infarction) ([place])  I21.4 DG Chest 2 View       Problem List Items Addressed This Visit       Cardiovascular and Mediastinum  NSTEMI (non-ST elevated myocardial infarction) [facility])   Relevant Orders   DG Chest 2 View   CAD (coronary artery disease)    CCTA was fine last year      Relevant Orders   DG Chest 2 View   Essential hypertension   Relevant Orders   DG Chest 2 View    Carotid stenosis   Relevant Orders   DG Chest 2 View   Other Visit Diagnoses     Acute cough    -  Primary   Takes lisinopril but been on it since 2017, and cough started 1 week bac. Will do CXR, going to see primary at [facility] [place].   Relevant Orders   DG Chest 2 View          Disposition:   Return in about 1 week (around 01/25/2023) for cxr today.    Total time spent: 30 minutes  Signed,  Neoma Laming, MD  01/18/2023 9:55 AM    [place]

## 2023-01-18 NOTE — Assessment & Plan Note (Signed)
CCTA was fine last year

## 2023-01-19 ENCOUNTER — Ambulatory Visit (INDEPENDENT_AMBULATORY_CARE_PROVIDER_SITE_OTHER): Payer: BC Managed Care – PPO | Admitting: Physician Assistant

## 2023-01-19 ENCOUNTER — Encounter: Payer: Self-pay | Admitting: Physician Assistant

## 2023-01-19 VITALS — BP 141/88 | HR 69 | Temp 98.2°F | Resp 16 | Ht 66.0 in | Wt 187.4 lb

## 2023-01-19 DIAGNOSIS — Z0001 Encounter for general adult medical examination with abnormal findings: Secondary | ICD-10-CM | POA: Diagnosis not present

## 2023-01-19 DIAGNOSIS — Z1212 Encounter for screening for malignant neoplasm of rectum: Secondary | ICD-10-CM

## 2023-01-19 DIAGNOSIS — I10 Essential (primary) hypertension: Secondary | ICD-10-CM

## 2023-01-19 DIAGNOSIS — Z1211 Encounter for screening for malignant neoplasm of colon: Secondary | ICD-10-CM

## 2023-01-19 DIAGNOSIS — R300 Dysuria: Secondary | ICD-10-CM | POA: Diagnosis not present

## 2023-01-19 DIAGNOSIS — J0100 Acute maxillary sinusitis, unspecified: Secondary | ICD-10-CM | POA: Diagnosis not present

## 2023-01-19 MED ORDER — AZITHROMYCIN 250 MG PO TABS: ORAL_TABLET | ORAL | 0 refills | Status: AC

## 2023-01-19 NOTE — Progress Notes (Signed)
[facility] [place], [place] [address]  Internal MEDICINE  Office Visit Note  Patient Name: Nicholas Torres  [id number]  [id number]  Date of Service: 01/23/2023  Chief Complaint  Patient presents with   Annual Exam   Hyperlipidemia   Hypertension   Quality Metric Gaps    TDAP and Shingles   Cough    Has been having cough for 2 weeks, coughing up green/yellow mucus.     HPI Pt is here for routine health maintenance examination -For the past week or so has been congested and having a productive cough with green/yellow mucus -Taking some OTC cold and flu cough medicine and allergy pill -Denies wheezing or SOB -will think about shingles vaccine, tdap -Colonoscopy due this year, will go ahead and send referral to schedule later this year -BP borderline in office, but normally well controlled and was stable at cardiology visit yesterday  Current Medication: Outpatient Encounter Medications as of 01/19/2023  Medication Sig   AMBULATORY NON FORMULARY MEDICATION Trimix (30/1/10)-(Pap/Phent/PGE)  Test Dose  81ml vial   Qty #3 Refills 0  [facility] [phone] Fax [phone]   ASPIRIN LOW DOSE 81 MG EC tablet Take 81 mg by mouth daily.   azithromycin (ZITHROMAX) 250 MG tablet Take 2 tablets on day 1, then 1 tablet daily on days 2 through 5   clopidogrel (PLAVIX) 75 MG tablet Take 75 mg by mouth daily.   cyclobenzaprine (FLEXERIL) 10 MG tablet TAKE 1 TABLET BY MOUTH AT BEDTIME FOR NECK SPASMS   fluticasone (FLONASE) 50 MCG/ACT nasal spray PLACE 1 SPRAY INTO BOTH NOSTRILS DAILY AS NEEDED.   levothyroxine (SYNTHROID) 25 MCG tablet TAKE 1 TAB DAILY BEFORE BREAKFAST ON EMPTY STOMACH, WAIT 30 MINUTES AFTER TAKING MEDICATION TO EAT OR TAKE OTHER MEDICATIONS.   lisinopril (ZESTRIL) 20 MG tablet Take 1 tablet (20 mg total) by mouth daily. THIS IS INCREASED DOSE   metoprolol succinate (TOPROL-XL) 25 MG 24 hr tablet Take 12.5 mg by mouth daily.    pantoprazole (PROTONIX) 20 MG tablet Take 1 tablet (20 mg total) by mouth daily.   rosuvastatin (CRESTOR) 40 MG tablet TAKE 1 TABLET BY MOUTH EVERY DAY IN THE MORNING   spironolactone (ALDACTONE) 25 MG tablet Take 25 mg by mouth daily.   tadalafil (CIALIS) 20 MG tablet Take 1 tablet (20 mg total) by mouth daily as needed for erectile dysfunction.   No facility-administered encounter medications on file as of 01/19/2023.    Surgical History: Past Surgical History:  Procedure Laterality Date   CARDIAC CATHETERIZATION Right 07/24/2016   Procedure: Left Heart Cath and Coronary Angiography;  Surgeon: Dionisio David, MD;  Location: [place] [facility];  Service: Cardiovascular;  Laterality: Right;   CORONARY ARTERY BYPASS GRAFT N/A 07/26/2016   Procedure: CORONARY ARTERY BYPASS GRAFTING (CABG), ON PUMP, TIMES FOUR, USING BILATERAL INTERNAL MAMMARY ARTERIES, RIGHT GREATER SAPHENOUS VEIN HARVESTED ENDOSCOPICALLY;  Surgeon: Melrose Nakayama, MD;  Location: [place];  Service: Open Heart Surgery;  Laterality: N/A;   ENDARTERECTOMY Right 01/22/2020   Procedure: ENDARTERECTOMY CAROTID;  Surgeon: Algernon Huxley, MD;  Location: [facility];  Service: Vascular;  Laterality: Right;   INGUINAL HERNIA REPAIR Right 04/16/2020   Procedure: HERNIA REPAIR INGUINAL ADULT;  Surgeon: Fredirick Maudlin, MD;  Location: [facility];  Service: General;  Laterality: Right;   RADIAL ARTERY HARVEST Left 07/26/2016   Procedure: RADIAL LEFT ARTERY HARVEST;  Surgeon: Melrose Nakayama, MD;  Location: [place];  Service: Open Heart  Surgery;  Laterality: Left;   TEE WITHOUT CARDIOVERSION N/A 07/26/2016   Procedure: TRANSESOPHAGEAL ECHOCARDIOGRAM (TEE);  Surgeon: Melrose Nakayama, MD;  Location: [place];  Service: Open Heart Surgery;  Laterality: N/A;    Medical History: Past Medical History:  Diagnosis Date   Arthritis    back   CAD (coronary artery disease) 2017   CHF (congestive heart failure) (HCC)    High cholesterol     Hypertension    Myocardial infarction [facility]) 2017   Pollen allergy     Family History: Family History  Problem Relation Age of Onset   Diabetes Mother    Heart attack Father    CAD Brother       Review of Systems  Constitutional:  Negative for chills, fatigue and unexpected weight change.  HENT:  Positive for congestion, rhinorrhea and tinnitus. Negative for postnasal drip, sneezing and sore throat.   Eyes:  Negative for redness.  Respiratory:  Positive for cough. Negative for chest tightness, shortness of breath and wheezing.   Cardiovascular:  Negative for chest pain and palpitations.  Gastrointestinal:  Negative for abdominal pain, constipation, diarrhea, nausea and vomiting.  Genitourinary:  Negative for dysuria and frequency.  Musculoskeletal:  Positive for arthralgias, back pain and neck pain. Negative for joint swelling.  Skin:  Negative for rash.  Neurological: Negative.  Negative for tremors and numbness.  Hematological:  Negative for adenopathy. Does not bruise/bleed easily.  Psychiatric/Behavioral:  Positive for sleep disturbance. Negative for behavioral problems (Depression) and suicidal ideas. The patient is not nervous/anxious.      Vital Signs: BP (!) 141/88   Pulse 69   Temp 98.2 F (36.8 C)   Resp 16   Ht 5\' 6"  (1.676 m)   Wt 187 lb 6.4 oz (85 kg)   SpO2 99%   BMI 30.25 kg/m    Physical Exam Vitals and nursing note reviewed.  Constitutional:      General: He is not in acute distress.    Appearance: Normal appearance. He is well-developed. He is obese. He is not diaphoretic.  HENT:     Head: Normocephalic and atraumatic.     Mouth/Throat:     Pharynx: No oropharyngeal exudate.  Eyes:     Pupils: Pupils are equal, round, and reactive to light.  Neck:     Thyroid: No thyromegaly.     Vascular: No JVD.     Trachea: No tracheal deviation.  Cardiovascular:     Rate and Rhythm: Normal rate and regular rhythm.     Heart sounds: Normal heart sounds.  No murmur heard.    No friction rub. No gallop.  Pulmonary:     Effort: Pulmonary effort is normal. No respiratory distress.     Breath sounds: No wheezing or rales.  Chest:     Chest wall: No tenderness.  Abdominal:     General: Bowel sounds are normal.     Palpations: Abdomen is soft.     Tenderness: There is no abdominal tenderness.  Musculoskeletal:        General: Normal range of motion.     Cervical back: Normal range of motion and neck supple.  Lymphadenopathy:     Cervical: No cervical adenopathy.  Skin:    General: Skin is warm and dry.  Neurological:     Mental Status: He is alert and oriented to person, place, and time.     Cranial Nerves: No cranial nerve deficit.  Psychiatric:  Behavior: Behavior normal.        Thought Content: Thought content normal.        Judgment: Judgment normal.      LABS: Recent Results (from the past 2160 hour(s))  UA/M w/rflx Culture, Routine     Status: None   Collection Time: 01/19/23 12:50 PM   Specimen: Urine   Urine  Result Value Ref Range   Specific Gravity, UA 1.020 1.005 - 1.030   pH, UA 5.5 5.0 - 7.5   Color, UA Yellow Yellow   Appearance Ur Clear Clear   Leukocytes,UA Negative Negative   Protein,UA Negative Negative/Trace   Glucose, UA Negative Negative   Ketones, UA Negative Negative   RBC, UA Negative Negative   Bilirubin, UA Negative Negative   Urobilinogen, Ur 0.2 0.2 - 1.0 mg/dL   Nitrite, UA Negative Negative   Microscopic Examination Comment     Comment: Microscopic follows if indicated.   Microscopic Examination See below:     Comment: Microscopic was indicated and was performed.   Urinalysis Reflex Comment     Comment: This specimen will not reflex to a Urine Culture.  Microscopic Examination     Status: None   Collection Time: 01/19/23 12:50 PM   Urine  Result Value Ref Range   WBC, UA None seen 0 - 5 /hpf   RBC, Urine 0-2 0 - 2 /hpf   Epithelial Cells (non renal) None seen 0 - 10 /hpf    Casts None seen None seen /lpf   Bacteria, UA None seen None seen/Few        Assessment/Plan: 1. Encounter for general adult medical examination with abnormal findings Cpe performed,pt will consider shingles and tdap vaccines once feeling better  2. Essential hypertension Borderline in office, but generally well controlled  3. Acute non-recurrent maxillary sinusitis Will start on zpak and use flonase and OTC cough medication - azithromycin (ZITHROMAX) 250 MG tablet; Take 2 tablets on day 1, then 1 tablet daily on days 2 through 5  Dispense: 6 tablet; Refill: 0  4. Screening for colorectal cancer - Ambulatory referral to Gastroenterology  5. Dysuria - UA/M w/rflx Culture, Routine   General Counseling: Alvan verbalizes understanding of the findings of todays visit and agrees with plan of treatment. I have discussed any further diagnostic evaluation that may be needed or ordered today. We also reviewed his medications today. he has been encouraged to call the office with any questions or concerns that should arise related to todays visit.    Counseling:    Orders Placed This Encounter  Procedures   Microscopic Examination   UA/M w/rflx Culture, Routine   Ambulatory referral to Gastroenterology    Meds ordered this encounter  Medications   azithromycin (ZITHROMAX) 250 MG tablet    Sig: Take 2 tablets on day 1, then 1 tablet daily on days 2 through 5    Dispense:  6 tablet    Refill:  0    This patient was seen by Drema Dallas, PA-C in collaboration with Dr. Clayborn Bigness as a part of collaborative care agreement.  Total time spent:35 Minutes  Time spent includes review of chart, medications, test results, and follow up plan with the patient.     Lavera Guise, MD  Internal Medicine

## 2023-01-20 LAB — MICROSCOPIC EXAMINATION
Bacteria, UA: NONE SEEN
Casts: NONE SEEN /lpf
Epithelial Cells (non renal): NONE SEEN /hpf (ref 0–10)
WBC, UA: NONE SEEN /hpf (ref 0–5)

## 2023-01-20 LAB — UA/M W/RFLX CULTURE, ROUTINE
Bilirubin, UA: NEGATIVE
Glucose, UA: NEGATIVE
Ketones, UA: NEGATIVE
Leukocytes,UA: NEGATIVE
Nitrite, UA: NEGATIVE
Protein,UA: NEGATIVE
RBC, UA: NEGATIVE
Specific Gravity, UA: 1.02 (ref 1.005–1.030)
Urobilinogen, Ur: 0.2 mg/dL (ref 0.2–1.0)
pH, UA: 5.5 (ref 5.0–7.5)

## 2023-01-22 ENCOUNTER — Telehealth: Payer: Self-pay

## 2023-01-22 NOTE — Telephone Encounter (Signed)
Patient contacted to schedule colonoscopy.  He is not due until 10/04/23. Would like to schedule later.  A call patient reminder sent to myself.  Thanks, [place], [place]

## 2023-01-25 ENCOUNTER — Ambulatory Visit: Payer: BC Managed Care – PPO | Admitting: Cardiovascular Disease

## 2023-01-29 ENCOUNTER — Ambulatory Visit (INDEPENDENT_AMBULATORY_CARE_PROVIDER_SITE_OTHER): Payer: BC Managed Care – PPO | Admitting: Cardiovascular Disease

## 2023-01-29 ENCOUNTER — Encounter: Payer: Self-pay | Admitting: Cardiovascular Disease

## 2023-01-29 VITALS — BP 122/74 | HR 85 | Ht 66.0 in | Wt 186.2 lb

## 2023-01-29 DIAGNOSIS — R051 Acute cough: Secondary | ICD-10-CM | POA: Diagnosis not present

## 2023-01-29 DIAGNOSIS — I2510 Atherosclerotic heart disease of native coronary artery without angina pectoris: Secondary | ICD-10-CM

## 2023-01-29 DIAGNOSIS — I10 Essential (primary) hypertension: Secondary | ICD-10-CM | POA: Diagnosis not present

## 2023-01-29 NOTE — Patient Instructions (Signed)
Take Zyrtec or Claritin daily for sinus drainage.

## 2023-01-29 NOTE — Progress Notes (Signed)
Cardiology Office Note   Date:  01/29/2023   ID:  Nicholas Torres, DOB 08-19-1961, MRN [id number]  PCP:  Mylinda Latina, PA-C  Cardiologist:  Neoma Laming, MD      History of Present Illness: Nicholas Torres is a 62 y.o. male who presents for  Chief Complaint  Patient presents with   Follow-up    1 week X-ray results    Patient in office for 1 week follow up, chest xray. Patient saw PCP on 01/19/23, was prescribed an antibiotic and nasal spray for sinusitis. Patinet reports taking benadryl as needed.   Cough This is a new problem. The current episode started 1 to 4 weeks ago. The problem has been gradually improving. Associated symptoms include nasal congestion and postnasal drip. Treatments tried: PO ATB, nasal spray. The treatment provided moderate relief.     Past Medical History:  Diagnosis Date   Arthritis    back   CAD (coronary artery disease) 2017   CHF (congestive heart failure) (HCC)    High cholesterol    Hypertension    Myocardial infarction [facility]) 2017   Pollen allergy      Past Surgical History:  Procedure Laterality Date   CARDIAC CATHETERIZATION Right 07/24/2016   Procedure: Left Heart Cath and Coronary Angiography;  Surgeon: Dionisio David, MD;  Location: [place] [facility];  Service: Cardiovascular;  Laterality: Right;   CORONARY ARTERY BYPASS GRAFT N/A 07/26/2016   Procedure: CORONARY ARTERY BYPASS GRAFTING (CABG), ON PUMP, TIMES FOUR, USING BILATERAL INTERNAL MAMMARY ARTERIES, RIGHT GREATER SAPHENOUS VEIN HARVESTED ENDOSCOPICALLY;  Surgeon: Melrose Nakayama, MD;  Location: [place];  Service: Open Heart Surgery;  Laterality: N/A;   ENDARTERECTOMY Right 01/22/2020   Procedure: ENDARTERECTOMY CAROTID;  Surgeon: Algernon Huxley, MD;  Location: [facility];  Service: Vascular;  Laterality: Right;   INGUINAL HERNIA REPAIR Right 04/16/2020   Procedure: HERNIA REPAIR INGUINAL ADULT;  Surgeon: Fredirick Maudlin, MD;  Location: [facility];  Service: General;   Laterality: Right;   RADIAL ARTERY HARVEST Left 07/26/2016   Procedure: RADIAL LEFT ARTERY HARVEST;  Surgeon: Melrose Nakayama, MD;  Location: [place];  Service: Open Heart Surgery;  Laterality: Left;   TEE WITHOUT CARDIOVERSION N/A 07/26/2016   Procedure: TRANSESOPHAGEAL ECHOCARDIOGRAM (TEE);  Surgeon: Melrose Nakayama, MD;  Location: [place];  Service: Open Heart Surgery;  Laterality: N/A;    Current Outpatient Medications  Medication Sig Dispense Refill   AMBULATORY NON FORMULARY MEDICATION Trimix (30/1/10)-(Pap/Phent/PGE)  Test Dose  46ml vial   Qty #3 Refills 0  [facility] [phone] Fax [phone] 3 mL 0   ASPIRIN LOW DOSE 81 MG EC tablet Take 81 mg by mouth daily.     clopidogrel (PLAVIX) 75 MG tablet Take 75 mg by mouth daily.     cyclobenzaprine (FLEXERIL) 10 MG tablet TAKE 1 TABLET BY MOUTH AT BEDTIME FOR NECK SPASMS 30 tablet 2   fluticasone (FLONASE) 50 MCG/ACT nasal spray PLACE 1 SPRAY INTO BOTH NOSTRILS DAILY AS NEEDED. 48 mL 1   levothyroxine (SYNTHROID) 25 MCG tablet TAKE 1 TAB DAILY BEFORE BREAKFAST ON EMPTY STOMACH, WAIT 30 MINUTES AFTER TAKING MEDICATION TO EAT OR TAKE OTHER MEDICATIONS. 90 tablet 0   lisinopril (ZESTRIL) 20 MG tablet Take 1 tablet (20 mg total) by mouth daily. THIS IS INCREASED DOSE 90 tablet 1   metoprolol succinate (TOPROL-XL) 25 MG 24 hr tablet Take 12.5 mg by mouth daily.     pantoprazole (PROTONIX) 20 MG tablet  Take 1 tablet (20 mg total) by mouth daily. 90 tablet 1   rosuvastatin (CRESTOR) 40 MG tablet TAKE 1 TABLET BY MOUTH EVERY DAY IN THE MORNING 90 tablet 1   spironolactone (ALDACTONE) 25 MG tablet Take 25 mg by mouth daily.     tadalafil (CIALIS) 20 MG tablet Take 1 tablet (20 mg total) by mouth daily as needed for erectile dysfunction. 30 tablet 3   No current facility-administered medications for this visit.    Allergies:   Patient has no known allergies.    Social History:   reports that he has never smoked. He has  never been exposed to tobacco smoke. He has never used smokeless tobacco. He reports that he does not drink alcohol and does not use drugs.   Family History:  family history includes CAD in his brother; Diabetes in his mother; Heart attack in his father.    ROS:     Review of Systems  Constitutional: Negative.   HENT:  Positive for postnasal drip.   Eyes: Negative.   Respiratory:  Positive for cough.   Cardiovascular: Negative.   Gastrointestinal: Negative.   Genitourinary: Negative.   Musculoskeletal: Negative.   Skin: Negative.   Neurological: Negative.   Endo/Heme/Allergies: Negative.   Psychiatric/Behavioral: Negative.    All other systems reviewed and are negative.   All other systems are reviewed and negative.   PHYSICAL EXAM: VS:  BP 122/74   Pulse 85   Ht 5\' 6"  (1.676 m)   Wt 186 lb 3.2 oz (84.5 kg)   SpO2 98%   BMI 30.05 kg/m  , BMI Body mass index is 30.05 kg/m. Last weight:  Wt Readings from Last 3 Encounters:  01/29/23 186 lb 3.2 oz (84.5 kg)  01/19/23 187 lb 6.4 oz (85 kg)  01/18/23 188 lb 9.6 oz (85.5 kg)   Physical Exam Vitals reviewed.  Constitutional:      Appearance: Normal appearance. He is normal weight.  HENT:     Head: Normocephalic.     Nose: Nose normal.     Mouth/Throat:     Mouth: Mucous membranes are moist.  Eyes:     Pupils: Pupils are equal, round, and reactive to light.  Cardiovascular:     Rate and Rhythm: Normal rate and regular rhythm.     Pulses: Normal pulses.     Heart sounds: Normal heart sounds.  Pulmonary:     Effort: Pulmonary effort is normal.  Abdominal:     General: Abdomen is flat. Bowel sounds are normal.  Musculoskeletal:        General: Normal range of motion.     Cervical back: Normal range of motion.  Skin:    General: Skin is warm.  Neurological:     General: No focal deficit present.     Mental Status: He is alert.  Psychiatric:        Mood and Affect: Mood normal.     EKG: none today  Recent  Labs: 05/29/2022: Hemoglobin 13.8    Lipid Panel    Component Value Date/Time   CHOL 111 11/11/2020 0808   TRIG 74 11/11/2020 0808   HDL 58 11/11/2020 0808   CHOLHDL 1.9 11/11/2020 0808   VLDL 17 07/24/2016 0612   LDLCALC 38 11/11/2020 0808     ASSESSMENT AND PLAN:    ICD-10-CM   1. Acute cough  R05.1     2. Coronary artery disease involving native coronary artery of native heart without angina pectoris  I25.10     3. Essential hypertension  I10        Problem List Items Addressed This Visit       Cardiovascular and Mediastinum   CAD (coronary artery disease)   Essential hypertension    Well controlled today. Continue current medications.         Other   Acute cough - Primary    Patient reports cough had improved since taking antibiotic. Recommend taking antihistamine daily for increased outdoor pollen. Continue using nasal spray.        Disposition:   Return in about 2 months (around 03/31/2023).    Total time spent: 30 minutes  Signed,  Neoma Laming, MD  01/29/2023 11:07 AM    [place]

## 2023-01-29 NOTE — Assessment & Plan Note (Signed)
Well controlled today. Continue current medications 

## 2023-01-29 NOTE — Assessment & Plan Note (Signed)
Patient reports cough had improved since taking antibiotic. Recommend taking antihistamine daily for increased outdoor pollen. Continue using nasal spray.

## 2023-02-04 ENCOUNTER — Other Ambulatory Visit: Payer: Self-pay | Admitting: Cardiovascular Disease

## 2023-02-04 DIAGNOSIS — I25118 Atherosclerotic heart disease of native coronary artery with other forms of angina pectoris: Secondary | ICD-10-CM

## 2023-03-02 ENCOUNTER — Other Ambulatory Visit: Payer: Self-pay | Admitting: Cardiovascular Disease

## 2023-03-02 DIAGNOSIS — I2510 Atherosclerotic heart disease of native coronary artery without angina pectoris: Secondary | ICD-10-CM

## 2023-03-24 ENCOUNTER — Other Ambulatory Visit: Payer: Self-pay | Admitting: Physician Assistant

## 2023-03-24 ENCOUNTER — Other Ambulatory Visit: Payer: Self-pay | Admitting: Cardiovascular Disease

## 2023-03-24 DIAGNOSIS — I10 Essential (primary) hypertension: Secondary | ICD-10-CM

## 2023-04-02 ENCOUNTER — Ambulatory Visit: Payer: BC Managed Care – PPO | Admitting: Cardiovascular Disease

## 2023-04-03 ENCOUNTER — Encounter: Payer: Self-pay | Admitting: Cardiovascular Disease

## 2023-04-03 ENCOUNTER — Ambulatory Visit: Payer: BC Managed Care – PPO | Admitting: Cardiovascular Disease

## 2023-04-03 VITALS — BP 116/78 | HR 71 | Ht 66.0 in | Wt 186.4 lb

## 2023-04-03 DIAGNOSIS — I2510 Atherosclerotic heart disease of native coronary artery without angina pectoris: Secondary | ICD-10-CM | POA: Diagnosis not present

## 2023-04-03 DIAGNOSIS — I2583 Coronary atherosclerosis due to lipid rich plaque: Secondary | ICD-10-CM | POA: Diagnosis not present

## 2023-04-03 DIAGNOSIS — I10 Essential (primary) hypertension: Secondary | ICD-10-CM | POA: Diagnosis not present

## 2023-04-03 DIAGNOSIS — E782 Mixed hyperlipidemia: Secondary | ICD-10-CM

## 2023-04-03 NOTE — Progress Notes (Signed)
Cardiology Office Note   Date:  04/03/2023   ID:  Nicholas Torres, DOB 17-Jan-1961, MRN [id number]  PCP:  Carlean Jews, PA-C  Cardiologist:  Adrian Blackwater, MD      History of Present Illness: Nicholas Torres is a 62 y.o. male who presents for  Chief Complaint  Patient presents with   Follow-up    2 month follow up    Patient in office for routine cardiac exam. Denies chest pain, shortness of breath, edema, palpitations.     Past Medical History:  Diagnosis Date   Arthritis    back   CAD (coronary artery disease) 2017   CHF (congestive heart failure) (HCC)    High cholesterol    Hypertension    Myocardial infarction [facility]) 2017   Pollen allergy      Past Surgical History:  Procedure Laterality Date   CARDIAC CATHETERIZATION Right 07/24/2016   Procedure: Left Heart Cath and Coronary Angiography;  Surgeon: Laurier Nancy, MD;  Location: [facility];  Service: Cardiovascular;  Laterality: Right;   CORONARY ARTERY BYPASS GRAFT N/A 07/26/2016   Procedure: CORONARY ARTERY BYPASS GRAFTING (CABG), ON PUMP, TIMES FOUR, USING BILATERAL INTERNAL MAMMARY ARTERIES, RIGHT GREATER SAPHENOUS VEIN HARVESTED ENDOSCOPICALLY;  Surgeon: Loreli Slot, MD;  Location: [facility];  Service: Open Heart Surgery;  Laterality: N/A;   ENDARTERECTOMY Right 01/22/2020   Procedure: ENDARTERECTOMY CAROTID;  Surgeon: Annice Needy, MD;  Location: [facility];  Service: Vascular;  Laterality: Right;   INGUINAL HERNIA REPAIR Right 04/16/2020   Procedure: HERNIA REPAIR INGUINAL ADULT;  Surgeon: Duanne Guess, MD;  Location: [facility];  Service: General;  Laterality: Right;   RADIAL ARTERY HARVEST Left 07/26/2016   Procedure: RADIAL LEFT ARTERY HARVEST;  Surgeon: Loreli Slot, MD;  Location: [facility];  Service: Open Heart Surgery;  Laterality: Left;   TEE WITHOUT CARDIOVERSION N/A 07/26/2016   Procedure: TRANSESOPHAGEAL ECHOCARDIOGRAM (TEE);  Surgeon: Loreli Slot, MD;  Location: [facility] OR;   Service: Open Heart Surgery;  Laterality: N/A;     Current Outpatient Medications  Medication Sig Dispense Refill   ASPIRIN LOW DOSE 81 MG tablet TAKE 1 TABLET BY MOUTH EVERY DAY 90 tablet 3   clopidogrel (PLAVIX) 75 MG tablet Take 75 mg by mouth daily.     cyclobenzaprine (FLEXERIL) 10 MG tablet TAKE 1 TABLET BY MOUTH AT BEDTIME FOR NECK SPASMS 30 tablet 2   fluticasone (FLONASE) 50 MCG/ACT nasal spray PLACE 1 SPRAY INTO BOTH NOSTRILS DAILY AS NEEDED. 48 mL 1   levothyroxine (SYNTHROID) 25 MCG tablet TAKE 1 TAB DAILY BEFORE BREAKFAST ON EMPTY STOMACH, WAIT 30 MINUTES AFTER TAKING MEDICATION TO EAT OR TAKE OTHER MEDICATIONS. 90 tablet 0   lisinopril (ZESTRIL) 20 MG tablet TAKE 1 TABLET BY MOUTH EVERY DAY 90 tablet 1   metoprolol succinate (TOPROL-XL) 25 MG 24 hr tablet USE AS DIRECTED 1/2 TABLET DAILY 45 tablet 0   pantoprazole (PROTONIX) 20 MG tablet TAKE 1 TABLET BY MOUTH EVERY DAY 90 tablet 1   rosuvastatin (CRESTOR) 40 MG tablet TAKE 1 TABLET BY MOUTH EVERY DAY 90 tablet 1   spironolactone (ALDACTONE) 25 MG tablet Take 25 mg by mouth daily.     tadalafil (CIALIS) 20 MG tablet Take 1 tablet (20 mg total) by mouth daily as needed for erectile dysfunction. 30 tablet 3   AMBULATORY NON FORMULARY MEDICATION Trimix (30/1/10)-(Pap/Phent/PGE)  Test Dose  1ml vial   Qty #3 Refills 0  [facility]  [phone] Fax [phone] 3 mL 0   No current facility-administered medications for this visit.    Allergies:   Patient has no known allergies.    Social History:   reports that he has never smoked. He has never been exposed to tobacco smoke. He has never used smokeless tobacco. He reports that he does not drink alcohol and does not use drugs.   Family History:  family history includes CAD in his brother; Diabetes in his mother; Heart attack in his father.    ROS:     Review of Systems  Constitutional: Negative.   HENT: Negative.    Eyes: Negative.   Respiratory: Negative.     Cardiovascular: Negative.   Gastrointestinal: Negative.   Genitourinary: Negative.   Musculoskeletal: Negative.   Skin: Negative.   Neurological: Negative.   Endo/Heme/Allergies: Negative.   Psychiatric/Behavioral: Negative.    All other systems reviewed and are negative.    All other systems are reviewed and negative.    PHYSICAL EXAM: VS:  BP 116/78 (BP Location: Left Arm, Patient Position: Sitting, Cuff Size: Large)   Pulse 71   Ht 5\' 6"  (1.676 m)   Wt 186 lb 6.4 oz (84.6 kg)   PF 98 L/min   BMI 30.09 kg/m  , BMI Body mass index is 30.09 kg/m. Last weight:  Wt Readings from Last 3 Encounters:  04/03/23 186 lb 6.4 oz (84.6 kg)  01/29/23 186 lb 3.2 oz (84.5 kg)  01/19/23 187 lb 6.4 oz (85 kg)    Physical Exam Vitals reviewed.  Constitutional:      Appearance: Normal appearance. He is normal weight.  HENT:     Head: Normocephalic.     Nose: Nose normal.     Mouth/Throat:     Mouth: Mucous membranes are moist.  Eyes:     Pupils: Pupils are equal, round, and reactive to light.  Cardiovascular:     Rate and Rhythm: Normal rate and regular rhythm.     Pulses: Normal pulses.     Heart sounds: Normal heart sounds.  Pulmonary:     Effort: Pulmonary effort is normal.  Abdominal:     General: Abdomen is flat. Bowel sounds are normal.  Musculoskeletal:        General: Normal range of motion.     Cervical back: Normal range of motion.  Skin:    General: Skin is warm.  Neurological:     General: No focal deficit present.     Mental Status: He is alert.  Psychiatric:        Mood and Affect: Mood normal.      EKG: none today  Recent Labs: 05/29/2022: Hemoglobin 13.8    Lipid Panel    Component Value Date/Time   CHOL 111 11/11/2020 0808   TRIG 74 11/11/2020 0808   HDL 58 11/11/2020 0808   CHOLHDL 1.9 11/11/2020 0808   VLDL 17 07/24/2016 0612   LDLCALC 38 11/11/2020 0808     ASSESSMENT AND PLAN:    ICD-10-CM   1. Essential hypertension  I10     2.  Mixed hyperlipidemia  E78.2     3. Coronary artery disease due to lipid rich plaque  I25.10    I25.83        Problem List Items Addressed This Visit       Cardiovascular and Mediastinum   CAD (coronary artery disease)    Denies chest pain. CCTA 08/2022  LIMA to LAD,SVG to OM and diagnal are all  patent. Small RCA with 50% disease in mid portion.       Essential hypertension - Primary    Well controlled today. Continue same medications.         Other   Mixed hyperlipidemia    Continue rosuvastatin 40 mg daily.         Disposition:   Return in about 4 months (around 08/04/2023).    Total time spent: 30 minutes  Signed,  Adrian Blackwater, MD  04/03/2023 11:44 AM    [facility]

## 2023-04-03 NOTE — Assessment & Plan Note (Addendum)
Denies chest pain. CCTA 08/2022  LIMA to LAD,SVG to OM and diagnal are all patent. Small RCA with 50% disease in mid portion.

## 2023-04-03 NOTE — Assessment & Plan Note (Signed)
Well controlled today. Continue same medications.  

## 2023-04-03 NOTE — Assessment & Plan Note (Signed)
Continue rosuvastatin 40mg daily  

## 2023-04-15 ENCOUNTER — Other Ambulatory Visit: Payer: Self-pay | Admitting: Cardiovascular Disease

## 2023-05-03 ENCOUNTER — Other Ambulatory Visit: Payer: Self-pay | Admitting: Cardiovascular Disease

## 2023-05-03 DIAGNOSIS — I25118 Atherosclerotic heart disease of native coronary artery with other forms of angina pectoris: Secondary | ICD-10-CM

## 2023-05-24 ENCOUNTER — Other Ambulatory Visit: Payer: BC Managed Care – PPO

## 2023-05-24 DIAGNOSIS — Z125 Encounter for screening for malignant neoplasm of prostate: Secondary | ICD-10-CM

## 2023-05-25 LAB — PSA: Prostate Specific Ag, Serum: 0.2 ng/mL (ref 0.0–4.0)

## 2023-05-28 NOTE — Progress Notes (Unsigned)
05/29/2023 9:58 AM   Ernie Hew 1960-12-08 [id number]  Referring provider: Carlean Jews, PA-C [address] [place],  [place] [address]  Urological history: 1.  ED -contributing factors of age, testosterone deficiency, BPH, CAD, CHF, MI, HTN and HLD -Trimix  -tadalafil 5 mg daily   2. Testosterone deficiency -contributing factors of age -he did not desire replacement therapy cited side effects  3. BPH -PSA (05/2023) 0.2  Chief Complaint  Patient presents with   Erectile Dysfunction    HPI: Nicholas Torres is a 62 y.o. male who presents today for follow up.   Previous records reviewed.     I PSS 5/1  No urinary complaints.  He denies any incontinence.  Patient denies any modifying or aggravating factors.  Patient denies any recent UTI's, gross hematuria, dysuria or suprapubic/flank pain.  Patient denies any fevers, chills, nausea or vomiting.   IPSS     Row Name 05/29/23 0900         International Prostate Symptom Score   How often have you had the sensation of not emptying your bladder? Not at All     How often have you had to urinate less than every two hours? About half the time     How often have you found you stopped and started again several times when you urinated? Not at All     How often have you found it difficult to postpone urination? Not at All     How often have you had a weak urinary stream? Less than 1 in 5 times     How often have you had to strain to start urination? Not at All     How many times did you typically get up at night to urinate? 1 Time     Total IPSS Score 5       Quality of Life due to urinary symptoms   If you were to spend the rest of your life with your urinary condition just the way it is now how would you feel about that? Pleased              IPSS     Row Name 05/29/23 0900         International Prostate Symptom Score   How often have you had the sensation of not emptying your bladder? Not at All     How  often have you had to urinate less than every two hours? About half the time     How often have you found you stopped and started again several times when you urinated? Not at All     How often have you found it difficult to postpone urination? Not at All     How often have you had a weak urinary stream? Less than 1 in 5 times     How often have you had to strain to start urination? Not at All     How many times did you typically get up at night to urinate? 1 Time     Total IPSS Score 5       Quality of Life due to urinary symptoms   If you were to spend the rest of your life with your urinary condition just the way it is now how would you feel about that? Pleased              Score:  1-7 Mild 8-19 Moderate 20-35 Severe   SHIM 11  He is taking the  tadalafil 20 mg daily.  He states that he does have issues with lightheadedness, so he does not take the tadalafil consistently on a daily basis.   He is not having satisfactory erections with this regimen.  Patient is not having spontaneous erections.  He denies any pain or curvature with erections.     SHIM     Row Name 05/29/23 1610         SHIM: Over the last 6 months:   How do you rate your confidence that you could get and keep an erection? Low     When you had erections with sexual stimulation, how often were your erections hard enough for penetration (entering your partner)? A Few Times (much less than half the time)     During sexual intercourse, how often were you able to maintain your erection after you had penetrated (entered) your partner? A Few Times (much less than half the time)     During sexual intercourse, how difficult was it to maintain your erection to completion of intercourse? Difficult     When you attempted sexual intercourse, how often was it satisfactory for you? A Few Times (much less than half the time)       SHIM Total Score   SHIM 11              SHIM     Row Name 05/29/23 9604          SHIM: Over the last 6 months:   How do you rate your confidence that you could get and keep an erection? Low     When you had erections with sexual stimulation, how often were your erections hard enough for penetration (entering your partner)? A Few Times (much less than half the time)     During sexual intercourse, how often were you able to maintain your erection after you had penetrated (entered) your partner? A Few Times (much less than half the time)     During sexual intercourse, how difficult was it to maintain your erection to completion of intercourse? Difficult     When you attempted sexual intercourse, how often was it satisfactory for you? A Few Times (much less than half the time)       SHIM Total Score   SHIM 11              Score: 1-7 Severe ED 8-11 Moderate ED 12-16 Mild-Moderate ED 17-21 Mild ED 22-25 No ED   PMH: Past Medical History:  Diagnosis Date   Arthritis    back   CAD (coronary artery disease) 2017   CHF (congestive heart failure) (HCC)    High cholesterol    Hypertension    Myocardial infarction (HCC) 2017   Pollen allergy     Surgical History: Past Surgical History:  Procedure Laterality Date   CARDIAC CATHETERIZATION Right 07/24/2016   Procedure: Left Heart Cath and Coronary Angiography;  Surgeon: Laurier Nancy, MD;  Location: [facility] INVASIVE CV LAB;  Service: Cardiovascular;  Laterality: Right;   CORONARY ARTERY BYPASS GRAFT N/A 07/26/2016   Procedure: CORONARY ARTERY BYPASS GRAFTING (CABG), ON PUMP, TIMES FOUR, USING BILATERAL INTERNAL MAMMARY ARTERIES, RIGHT GREATER SAPHENOUS VEIN HARVESTED ENDOSCOPICALLY;  Surgeon: Loreli Slot, MD;  Location: [facility] OR;  Service: Open Heart Surgery;  Laterality: N/A;   ENDARTERECTOMY Right 01/22/2020   Procedure: ENDARTERECTOMY CAROTID;  Surgeon: Annice Needy, MD;  Location: [facility] ORS;  Service: Vascular;  Laterality: Right;   INGUINAL HERNIA REPAIR  Right 04/16/2020   Procedure: HERNIA REPAIR INGUINAL  ADULT;  Surgeon: Duanne Guess, MD;  Location: [facility] ORS;  Service: General;  Laterality: Right;   RADIAL ARTERY HARVEST Left 07/26/2016   Procedure: RADIAL LEFT ARTERY HARVEST;  Surgeon: Loreli Slot, MD;  Location: [facility] OR;  Service: Open Heart Surgery;  Laterality: Left;   TEE WITHOUT CARDIOVERSION N/A 07/26/2016   Procedure: TRANSESOPHAGEAL ECHOCARDIOGRAM (TEE);  Surgeon: Loreli Slot, MD;  Location[facility] OR;  Service: Open Heart Surgery;  Laterality: N/A;    Home Medications:  Allergies as of 05/29/2023   No Known Allergies      Medication List        Accurate as of May 29, 2023  9:58 AM. If you have any questions, ask your nurse or doctor.          AMBULATORY NON FORMULARY MEDICATION Trimix (30/1/10)-(Pap/Phent/PGE)  Test Dose  1ml vial   Qty #3 Refills 0  [facility] [phone] Fax [phone]   Aspirin Low Dose 81 MG tablet Generic drug: aspirin EC TAKE 1 TABLET BY MOUTH EVERY DAY   clopidogrel 75 MG tablet Commonly known as: PLAVIX TAKE 1 TABLET BY MOUTH EVERY DAY   cyclobenzaprine 10 MG tablet Commonly known as: FLEXERIL TAKE 1 TABLET BY MOUTH AT BEDTIME FOR NECK SPASMS   fluticasone 50 MCG/ACT nasal spray Commonly known as: FLONASE PLACE 1 SPRAY INTO BOTH NOSTRILS DAILY AS NEEDED.   levothyroxine 25 MCG tablet Commonly known as: SYNTHROID TAKE 1 TAB DAILY BEFORE BREAKFAST ON EMPTY STOMACH, WAIT 30 MINUTES AFTER TAKING MEDICATION TO EAT OR TAKE OTHER MEDICATIONS.   lisinopril 20 MG tablet Commonly known as: ZESTRIL TAKE 1 TABLET BY MOUTH EVERY DAY   metoprolol succinate 25 MG 24 hr tablet Commonly known as: TOPROL-XL USE AS DIRECTED 1/2 TABLET DAILY   pantoprazole 20 MG tablet Commonly known as: PROTONIX TAKE 1 TABLET BY MOUTH EVERY DAY   rosuvastatin 40 MG tablet Commonly known as: CRESTOR TAKE 1 TABLET BY MOUTH EVERY DAY   spironolactone 25 MG tablet Commonly known as: ALDACTONE Take 25 mg by mouth daily.    tadalafil 20 MG tablet Commonly known as: CIALIS Take 1 tablet (20 mg total) by mouth daily as needed for erectile dysfunction.   testosterone 50 MG/5GM (1%) Gel Commonly known as: ANDROGEL Place 5 g onto the skin daily. Started by: Michiel Cowboy        Allergies: No Known Allergies  Family History: Family History  Problem Relation Age of Onset   Diabetes Mother    Heart attack Father    CAD Brother     Social History:  reports that he has never smoked. He has never been exposed to tobacco smoke. He has never used smokeless tobacco. He reports that he does not drink alcohol and does not use drugs.  ROS: Pertinent ROS in HPI  Physical Exam: BP 132/88   Pulse 82   Ht 5\' 6"  (1.676 m)   Wt 185 lb (83.9 kg)   BMI 29.86 kg/m   Constitutional:  Well nourished. Alert and oriented, No acute distress. HEENT: [place] AT, moist mucus membranes.  Trachea midline, no masses. Cardiovascular: No clubbing, cyanosis, or edema. Respiratory: Normal respiratory effort, no increased work of breathing. Neurologic: Grossly intact, no focal deficits, moving all 4 extremities. Psychiatric: Normal mood and affect.  Laboratory Data: Component     Latest Ref Rng 05/24/2023 Prostate Specific Ag, Serum     0.0 - 4.0 ng/mL 0.2  Urinalysis Component     Latest Ref Rng 01/19/2023 Specific Gravity, UA     1.005 - 1.030  1.020  pH, UA     5.0 - 7.5  5.5  Color, UA     Yellow  Yellow  Appearance Ur     Clear  Clear  Leukocytes,UA     Negative  Negative  Protein,UA     Negative/Trace  Negative  Glucose, UA     Negative  Negative  Ketones, UA     Negative  Negative  RBC, UA     Negative  Negative  Bilirubin, UA     Negative  Negative  Urobilinogen, Ur     0.2 - 1.0 mg/dL 0.2  Nitrite, UA     Negative  Negative  Microscopic Examination See below:  Microscopic Examination Comment  Urinalysis Reflex Comment   Component     Latest Ref Rng 01/19/2023 WBC, UA     0  - 5 /hpf None seen  RBC, Urine     0 - 2 /hpf 0-2  Epithelial Cells (non renal)     0 - 10 /hpf None seen  Casts     None seen /lpf None seen  Bacteria, UA     None seen/Few  None seen  I have reviewed the labs.   Pertinent Imaging: N/A  Assessment & Plan:    1. ED -Not having satisfactory sexual intercourse on tadalafil 20 mg daily -He is not interested in retrying Trimix -He does have a history of hypogonadism, so we discussed treating this and he is agreeable -He will continue taking the tadalafil 20 mg on-demand dosing  2. BPH -No bothersome symptoms  3. Testosterone deficiency -met criteria -explained that TRT is not a treatment for ED, he may see some improvement in his erections, but his ED will likely persist even with therapeutic levels of testosterone -Significant symptoms -Recommend starting TRT -We discussed the most common forms of replacement including intramuscular injection and gels and he desires to start gels  -Rx Androgel sent to pharmacy to start with one pump daily  -Follow-up 6 weeks after starting TRT for testosterone level, hemoglobin hematocrit -Potential side effects of testosterone replacement were discussed including stimulation of benign prostatic growth with lower urinary tract symptoms; erythrocytosis; edema; gynecomastia; worsening sleep apnea; venous thromboembolism; testicular atrophy and infertility. Recent studies suggesting an increased incidence of heart attack and stroke in patients taking testosterone was discussed. He was informed there is conflicting evidence regarding the impact of testosterone therapy on cardiovascular risk. The theoretical risk of growth stimulation of an undetected prostate cancer was also discussed.  He was informed that current evidence does not provide any definitive answers regarding the risks of testosterone therapy on prostate cancer and cardiovascular disease. The need for periodic monitoring of his  testosterone level, PSA, hematocrit and DRE was discussed.  Return in 1 month for testosterone level, hemoglobin and hematocrit  These notes generated with voice recognition software. I apologize for typographical errors.  Cloretta Ned  [facility] [address]  [address] [place], [place] [address] [phone]

## 2023-05-29 ENCOUNTER — Ambulatory Visit: Payer: BC Managed Care – PPO | Admitting: Urology

## 2023-05-29 ENCOUNTER — Encounter: Payer: Self-pay | Admitting: Urology

## 2023-05-29 VITALS — BP 132/88 | HR 82 | Ht 66.0 in | Wt 185.0 lb

## 2023-05-29 DIAGNOSIS — N400 Enlarged prostate without lower urinary tract symptoms: Secondary | ICD-10-CM

## 2023-05-29 DIAGNOSIS — N5201 Erectile dysfunction due to arterial insufficiency: Secondary | ICD-10-CM | POA: Diagnosis not present

## 2023-05-29 DIAGNOSIS — E349 Endocrine disorder, unspecified: Secondary | ICD-10-CM

## 2023-05-29 DIAGNOSIS — E291 Testicular hypofunction: Secondary | ICD-10-CM | POA: Diagnosis not present

## 2023-05-29 MED ORDER — TESTOSTERONE 50 MG/5GM (1%) TD GEL: 5.0000 g | Freq: Every day | TRANSDERMAL | 3 refills | Status: DC

## 2023-06-25 ENCOUNTER — Telehealth: Payer: Self-pay | Admitting: Urology

## 2023-06-25 NOTE — Telephone Encounter (Signed)
Pt called office and said he is unable to get Androgel.  He said pharmacy said they have sent 2 request and haven't heard from office.  He wants to know what he needs to do to get this taken care of.

## 2023-06-28 NOTE — Telephone Encounter (Signed)
  Spoke with [facility] and androgel is not on his formulary. Pt never got prescription from July and stated if he doesn't need it he will be ok unless we have an alternative that's cheaper than $395.00.

## 2023-06-29 ENCOUNTER — Other Ambulatory Visit: Payer: BC Managed Care – PPO

## 2023-07-23 ENCOUNTER — Ambulatory Visit (INDEPENDENT_AMBULATORY_CARE_PROVIDER_SITE_OTHER): Payer: BC Managed Care – PPO | Admitting: Physician Assistant

## 2023-07-23 ENCOUNTER — Encounter: Payer: Self-pay | Admitting: Physician Assistant

## 2023-07-23 VITALS — BP 146/88 | HR 93 | Temp 98.1°F | Resp 16 | Ht 66.0 in | Wt 187.2 lb

## 2023-07-23 DIAGNOSIS — K219 Gastro-esophageal reflux disease without esophagitis: Secondary | ICD-10-CM

## 2023-07-23 DIAGNOSIS — I10 Essential (primary) hypertension: Secondary | ICD-10-CM

## 2023-07-23 DIAGNOSIS — G4733 Obstructive sleep apnea (adult) (pediatric): Secondary | ICD-10-CM

## 2023-07-23 DIAGNOSIS — T7840XD Allergy, unspecified, subsequent encounter: Secondary | ICD-10-CM

## 2023-07-23 MED ORDER — FLUTICASONE PROPIONATE 50 MCG/ACT NA SUSP: 1.0000 | Freq: Every day | NASAL | 1 refills | Status: DC

## 2023-07-23 NOTE — Progress Notes (Signed)
[facility] [address] [place], [place] [address]  Internal MEDICINE  Office Visit Note  Patient Name: Nicholas Torres  [id number]  [id number]  Date of Service: 07/23/2023  Chief Complaint  Patient presents with   Follow-up   Hyperlipidemia   Hypertension   Quality Metric Gaps    Colonoscopy    HPI Pt is here for routine follow up -likely ganglion cyst on dorsum right wrist--[facility]. [facility] about 2 weeks or so -BP at home fluctuates, often well controlled outside of office, but occasionally high at home. Discussed he may take extra 1/2 tab lisinopril (30mg  total) if BP running high -Nasal congestion at times still. Hasn't used flonase in the past few weeks. Plans to [facility] as well as he feels he has difficulty breathing through nose when laying down as well. -uses CPAP at night, sometimes removes it during his sleep. A little claustrophobic with it sometimes. Wears FF mask, which is needed with difficulty breathing through nose at times. On further exam some tenderness over nasal bridge where cpap mask sits and may not be fitting well/could try alternative seal or style. -sees vascular in October, states when he yawns feels like a cramp near where previous carotid surgery done. Has to yawn slowly to avoid this  Current Medication: Outpatient Encounter Medications as of 07/23/2023  Medication Sig   AMBULATORY NON FORMULARY MEDICATION Trimix (30/1/10)-(Pap/Phent/PGE)  Test Dose  1ml vial   Qty #3 Refills 0  [facility] [phone] Fax [phone]   ASPIRIN LOW DOSE 81 MG tablet TAKE 1 TABLET BY MOUTH EVERY DAY   clopidogrel (PLAVIX) 75 MG tablet TAKE 1 TABLET BY MOUTH EVERY DAY   cyclobenzaprine (FLEXERIL) 10 MG tablet TAKE 1 TABLET BY MOUTH AT BEDTIME FOR NECK SPASMS   levothyroxine (SYNTHROID) 25 MCG tablet TAKE 1 TAB DAILY BEFORE BREAKFAST ON EMPTY STOMACH, WAIT 30 MINUTES AFTER TAKING MEDICATION TO EAT  OR TAKE OTHER MEDICATIONS.   lisinopril (ZESTRIL) 20 MG tablet TAKE 1 TABLET BY MOUTH EVERY DAY   metoprolol succinate (TOPROL-XL) 25 MG 24 hr tablet USE AS DIRECTED 1/2 TABLET DAILY   pantoprazole (PROTONIX) 20 MG tablet TAKE 1 TABLET BY MOUTH EVERY DAY   rosuvastatin (CRESTOR) 40 MG tablet TAKE 1 TABLET BY MOUTH EVERY DAY   spironolactone (ALDACTONE) 25 MG tablet Take 25 mg by mouth daily.   tadalafil (CIALIS) 20 MG tablet Take 1 tablet (20 mg total) by mouth daily as needed for erectile dysfunction.   testosterone (ANDROGEL) 50 MG/5GM (1%) GEL Place 5 g onto the skin daily.   [DISCONTINUED] fluticasone (FLONASE) 50 MCG/ACT nasal spray PLACE 1 SPRAY INTO BOTH NOSTRILS DAILY AS NEEDED.   fluticasone (FLONASE) 50 MCG/ACT nasal spray Place 1 spray into both nostrils daily.   No facility-administered encounter medications on file as of 07/23/2023.    Surgical History: Past Surgical History:  Procedure Laterality Date   CARDIAC CATHETERIZATION Right 07/24/2016   Procedure: Left Heart Cath and Coronary Angiography;  Surgeon: Laurier Nancy, MD;  Location: [facility] INVASIVE CV LAB;  Service: Cardiovascular;  Laterality: Right;   CORONARY ARTERY BYPASS GRAFT N/A 07/26/2016   Procedure: CORONARY ARTERY BYPASS GRAFTING (CABG), ON PUMP, TIMES FOUR, USING BILATERAL INTERNAL MAMMARY ARTERIES, RIGHT GREATER SAPHENOUS VEIN HARVESTED ENDOSCOPICALLY;  Surgeon: Loreli Slot, MD;  Location: [facility] OR;  Service: Open Heart Surgery;  Laterality: N/A;   ENDARTERECTOMY Right 01/22/2020   Procedure: ENDARTERECTOMY CAROTID;  Surgeon:  Annice Needy, MD;  Location: [facility] ORS;  Service: Vascular;  Laterality: Right;   INGUINAL HERNIA REPAIR Right 04/16/2020   Procedure: HERNIA REPAIR INGUINAL ADULT;  Surgeon: Duanne Guess, MD;  Location: [facility] ORS;  Service: General;  Laterality: Right;   RADIAL ARTERY HARVEST Left 07/26/2016   Procedure: RADIAL LEFT ARTERY HARVEST;  Surgeon: Loreli Slot, MD;  Location: [facility] OR;   Service: Open Heart Surgery;  Laterality: Left;   TEE WITHOUT CARDIOVERSION N/A 07/26/2016   Procedure: TRANSESOPHAGEAL ECHOCARDIOGRAM (TEE);  Surgeon: Loreli Slot, MD;  Location: [facility] OR;  Service: Open Heart Surgery;  Laterality: N/A;    Medical History: Past Medical History:  Diagnosis Date   Arthritis    back   CAD (coronary artery disease) 2017   CHF (congestive heart failure) (HCC)    High cholesterol    Hypertension    Myocardial infarction (HCC) 2017   Pollen allergy     Family History: Family History  Problem Relation Age of Onset   Diabetes Mother    Heart attack Father    CAD Brother     Social History   Socioeconomic History   Marital status: Married    Spouse name: Not on file   Number of children: Not on file   Years of education: Not on file   Highest education level: Not on file  Occupational History   Not on file  Tobacco Use   Smoking status: Never    Passive exposure: Never   Smokeless tobacco: Never  Vaping Use   Vaping status: Never Used  Substance and Sexual Activity   Alcohol use: No   Drug use: No   Sexual activity: Yes  Other Topics Concern   Not on file  Social History Narrative   Not on file   Social Determinants of Health   Financial Resource Strain: Not on file  Food Insecurity: Not on file  Transportation Needs: Not on file  Physical Activity: Not on file  Stress: Not on file  Social Connections: Not on file  Intimate Partner Violence: Not on file      Review of Systems  Constitutional:  Negative for chills, fatigue and unexpected weight change.  HENT:  Positive for congestion and tinnitus. Negative for postnasal drip, rhinorrhea, sneezing and sore throat.   Eyes:  Negative for redness.  Respiratory:  Negative for cough, chest tightness and shortness of breath.   Cardiovascular:  Negative for chest pain and palpitations.  Gastrointestinal:  Negative for abdominal pain, constipation, diarrhea, nausea and  vomiting.  Genitourinary:  Negative for dysuria and frequency.  Musculoskeletal:  Positive for arthralgias, back pain and neck pain. Negative for joint swelling.       Bump on back of right wrist  Skin:  Negative for rash.  Neurological: Negative.  Negative for tremors and numbness.  Hematological:  Negative for adenopathy. Does not bruise/bleed easily.  Psychiatric/Behavioral:  Positive for sleep disturbance. Negative for behavioral problems (Depression) and suicidal ideas.     Vital Signs: BP (!) 146/88 Comment: 150/95  Pulse 93   Temp 98.1 F (36.7 C)   Resp 16   Ht 5\' 6"  (1.676 m)   Wt 187 lb 3.2 oz (84.9 kg)   SpO2 99%   BMI 30.21 kg/m    Physical Exam Vitals and nursing note reviewed.  Constitutional:      General: He is not in acute distress.    Appearance: Normal appearance. He is well-developed. He is obese. He is  not diaphoretic.  HENT:     Head: Normocephalic and atraumatic.     Nose:     Comments: Tenderness over nasal bridge    Mouth/Throat:     Pharynx: No oropharyngeal exudate.  Eyes:     Pupils: Pupils are equal, round, and reactive to light.  Neck:     Thyroid: No thyromegaly.     Vascular: No JVD.     Trachea: No tracheal deviation.  Cardiovascular:     Rate and Rhythm: Normal rate and regular rhythm.     Heart sounds: Normal heart sounds. No murmur heard.    No friction rub. No gallop.  Pulmonary:     Effort: Pulmonary effort is normal. No respiratory distress.     Breath sounds: No wheezing or rales.  Chest:     Chest wall: No tenderness.  Abdominal:     General: Bowel sounds are normal.     Palpations: Abdomen is soft.  Musculoskeletal:        General: Normal range of motion.     Cervical back: Normal range of motion and neck supple.     Comments: Likely ganglion cyst dorsal side of right wrist  Lymphadenopathy:     Cervical: No cervical adenopathy.  Skin:    General: Skin is warm and dry.  Neurological:     Mental Status: He is alert  and oriented to person, place, and time.     Cranial Nerves: No cranial nerve deficit.  Psychiatric:        Behavior: Behavior normal.        Thought Content: Thought content normal.        Judgment: Judgment normal.        Assessment/Plan: 1. White coat syndrome with diagnosis of hypertension Elevated in office, but often better at home and will monitor. May take extra 1/2 tab lisinopril (30mg  total) if elevated.  2. Gastroesophageal reflux disease, unspecified whether esophagitis present Continue pantoprazole as before  3. OSA (obstructive sleep apnea) Continue cpap nightly, discuss alternative mask seal/style with DME  4. Allergy, subsequent encounter May have ENT consult to address difficulty breathing through nose. May continue flonase - fluticasone (FLONASE) 50 MCG/ACT nasal spray; Place 1 spray into both nostrils daily.  Dispense: 48 mL; Refill: 1    General Counseling: Brasen verbalizes understanding of the findings of todays visit and agrees with plan of treatment. I have discussed any further diagnostic evaluation that may be needed or ordered today. We also reviewed his medications today. he has been encouraged to call the office with any questions or concerns that should arise related to todays visit.    No orders of the defined types were placed in this encounter.   Meds ordered this encounter  Medications   fluticasone (FLONASE) 50 MCG/ACT nasal spray    Sig: Place 1 spray into both nostrils daily.    Dispense:  48 mL    Refill:  1    This patient was seen by Lynn Ito, PA-C in collaboration with Dr. Beverely Risen as a part of collaborative care agreement.   Total time spent:30 Minutes Time spent includes review of chart, medications, test results, and follow up plan with the patient.      Dr Lyndon Code Internal medicine

## 2023-07-28 ENCOUNTER — Other Ambulatory Visit: Payer: Self-pay | Admitting: Cardiovascular Disease

## 2023-07-28 DIAGNOSIS — I25118 Atherosclerotic heart disease of native coronary artery with other forms of angina pectoris: Secondary | ICD-10-CM

## 2023-08-06 ENCOUNTER — Ambulatory Visit: Payer: BC Managed Care – PPO | Admitting: Cardiovascular Disease

## 2023-08-06 ENCOUNTER — Encounter: Payer: Self-pay | Admitting: Cardiovascular Disease

## 2023-08-06 VITALS — BP 140/100 | HR 80 | Ht 66.0 in | Wt 187.0 lb

## 2023-08-06 DIAGNOSIS — Z951 Presence of aortocoronary bypass graft: Secondary | ICD-10-CM

## 2023-08-06 DIAGNOSIS — I6521 Occlusion and stenosis of right carotid artery: Secondary | ICD-10-CM

## 2023-08-06 DIAGNOSIS — I2583 Coronary atherosclerosis due to lipid rich plaque: Secondary | ICD-10-CM

## 2023-08-06 DIAGNOSIS — I25118 Atherosclerotic heart disease of native coronary artery with other forms of angina pectoris: Secondary | ICD-10-CM

## 2023-08-06 DIAGNOSIS — I10 Essential (primary) hypertension: Secondary | ICD-10-CM

## 2023-08-06 DIAGNOSIS — E782 Mixed hyperlipidemia: Secondary | ICD-10-CM

## 2023-08-06 DIAGNOSIS — R0602 Shortness of breath: Secondary | ICD-10-CM

## 2023-08-06 DIAGNOSIS — I2510 Atherosclerotic heart disease of native coronary artery without angina pectoris: Secondary | ICD-10-CM

## 2023-08-06 MED ORDER — LISINOPRIL-HYDROCHLOROTHIAZIDE 20-12.5 MG PO TABS
1.0000 | ORAL_TABLET | Freq: Every day | ORAL | 2 refills | Status: DC
Start: 2023-08-06 — End: 2023-08-09

## 2023-08-06 NOTE — Progress Notes (Signed)
Cardiology Office Note   Date:  08/06/2023   ID:  Nicholas Torres, DOB 05-Dec-1960, MRN [id number]  PCP:  Carlean Jews, PA-C  Cardiologist:  Adrian Blackwater, MD      History of Present Illness: Nicholas Torres is a 62 y.o. male who presents for  Chief Complaint  Patient presents with   Follow-up    4 MO F/U    Headaches and BP up, not sleeping much      Past Medical History:  Diagnosis Date   Arthritis    back   CAD (coronary artery disease) 2017   CHF (congestive heart failure) (HCC)    High cholesterol    Hypertension    Myocardial infarction (HCC) 2017   Pollen allergy      Past Surgical History:  Procedure Laterality Date   CARDIAC CATHETERIZATION Right 07/24/2016   Procedure: Left Heart Cath and Coronary Angiography;  Surgeon: Laurier Nancy, MD;  Location: [facility];  Service: Cardiovascular;  Laterality: Right;   CORONARY ARTERY BYPASS GRAFT N/A 07/26/2016   Procedure: CORONARY ARTERY BYPASS GRAFTING (CABG), ON PUMP, TIMES FOUR, USING BILATERAL INTERNAL MAMMARY ARTERIES, RIGHT GREATER SAPHENOUS VEIN HARVESTED ENDOSCOPICALLY;  Surgeon: Loreli Slot, MD;  Location: [facility] OR;  Service: Open Heart Surgery;  Laterality: N/A;   ENDARTERECTOMY Right 01/22/2020   Procedure: ENDARTERECTOMY CAROTID;  Surgeon: Annice Needy, MD;  Location: [facility] ORS;  Service: Vascular;  Laterality: Right;   INGUINAL HERNIA REPAIR Right 04/16/2020   Procedure: HERNIA REPAIR INGUINAL ADULT;  Surgeon: Duanne Guess, MD;  Location: [facility] ORS;  Service: General;  Laterality: Right;   RADIAL ARTERY HARVEST Left 07/26/2016   Procedure: RADIAL LEFT ARTERY HARVEST;  Surgeon: Loreli Slot, MD;  Location: [facility] OR;  Service: Open Heart Surgery;  Laterality: Left;   TEE WITHOUT CARDIOVERSION N/A 07/26/2016   Procedure: TRANSESOPHAGEAL ECHOCARDIOGRAM (TEE);  Surgeon: Loreli Slot, MD;  Location: [facility] OR;  Service: Open Heart Surgery;  Laterality: N/A;     Current  Outpatient Medications  Medication Sig Dispense Refill   lisinopril-hydrochlorothiazide (ZESTORETIC) 20-12.5 MG tablet Take 1 tablet by mouth daily. 30 tablet 2   AMBULATORY NON FORMULARY MEDICATION Trimix (30/1/10)-(Pap/Phent/PGE)  Test Dose  1ml vial   Qty #3 Refills 0  [facility] [phone] Fax [phone] 3 mL 0   ASPIRIN LOW DOSE 81 MG tablet TAKE 1 TABLET BY MOUTH EVERY DAY 90 tablet 3   clopidogrel (PLAVIX) 75 MG tablet TAKE 1 TABLET BY MOUTH EVERY DAY 90 tablet 1   cyclobenzaprine (FLEXERIL) 10 MG tablet TAKE 1 TABLET BY MOUTH AT BEDTIME FOR NECK SPASMS 30 tablet 2   fluticasone (FLONASE) 50 MCG/ACT nasal spray Place 1 spray into both nostrils daily. 48 mL 1   levothyroxine (SYNTHROID) 25 MCG tablet TAKE 1 TAB DAILY BEFORE BREAKFAST ON EMPTY STOMACH, WAIT 30 MINUTES AFTER TAKING MEDICATION TO EAT OR TAKE OTHER MEDICATIONS. 90 tablet 0   metoprolol succinate (TOPROL-XL) 25 MG 24 hr tablet TAKE 1/2 TABLET BY MOUTH EVERY DAY AS INSTRUCTED 45 tablet 0   pantoprazole (PROTONIX) 20 MG tablet TAKE 1 TABLET BY MOUTH EVERY DAY 90 tablet 1   rosuvastatin (CRESTOR) 40 MG tablet TAKE 1 TABLET BY MOUTH EVERY DAY 90 tablet 1   spironolactone (ALDACTONE) 25 MG tablet Take 25 mg by mouth daily.     tadalafil (CIALIS) 20 MG tablet Take 1 tablet (20 mg total) by mouth daily as needed for erectile dysfunction. 30 tablet 3  testosterone (ANDROGEL) 50 MG/5GM (1%) GEL Place 5 g onto the skin daily. 150 g 3   No current facility-administered medications for this visit.    Allergies:   Patient has no known allergies.    Social History:   reports that he has never smoked. He has never been exposed to tobacco smoke. He has never used smokeless tobacco. He reports that he does not drink alcohol and does not use drugs.   Family History:  family history includes CAD in his brother; Diabetes in his mother; Heart attack in his father.    ROS:     Review of Systems  Constitutional:  Negative.   HENT: Negative.    Eyes: Negative.   Respiratory: Negative.    Gastrointestinal: Negative.   Genitourinary: Negative.   Musculoskeletal: Negative.   Skin: Negative.   Neurological: Negative.   Endo/Heme/Allergies: Negative.   Psychiatric/Behavioral: Negative.    All other systems reviewed and are negative.     All other systems are reviewed and negative.    PHYSICAL EXAM: VS:  BP (!) 140/100   Pulse 80   Ht 5\' 6"  (1.676 m)   Wt 187 lb (84.8 kg)   SpO2 99%   BMI 30.18 kg/m  , BMI Body mass index is 30.18 kg/m. Last weight:  Wt Readings from Last 3 Encounters:  08/06/23 187 lb (84.8 kg)  07/23/23 187 lb 3.2 oz (84.9 kg)  05/29/23 185 lb (83.9 kg)     Physical Exam Vitals reviewed.  Constitutional:      Appearance: Normal appearance. He is normal weight.  HENT:     Head: Normocephalic.     Nose: Nose normal.     Mouth/Throat:     Mouth: Mucous membranes are moist.  Eyes:     Pupils: Pupils are equal, round, and reactive to light.  Cardiovascular:     Rate and Rhythm: Normal rate and regular rhythm.     Pulses: Normal pulses.     Heart sounds: Normal heart sounds.  Pulmonary:     Effort: Pulmonary effort is normal.  Abdominal:     General: Abdomen is flat. Bowel sounds are normal.  Musculoskeletal:        General: Normal range of motion.     Cervical back: Normal range of motion.  Skin:    General: Skin is warm.  Neurological:     General: No focal deficit present.     Mental Status: He is alert.  Psychiatric:        Mood and Affect: Mood normal.       EKG:   Recent Labs: No results found for requested labs within last 365 days.    Lipid Panel    Component Value Date/Time   CHOL 111 11/11/2020 0808   TRIG 74 11/11/2020 0808   HDL 58 11/11/2020 0808   CHOLHDL 1.9 11/11/2020 0808   VLDL 17 07/24/2016 0612   LDLCALC 38 11/11/2020 0808      Other studies Reviewed: Additional studies/ records that were reviewed today include:   Review of the above records demonstrates:       No data to display            ASSESSMENT AND PLAN:    ICD-10-CM   1. Essential hypertension  I10 lisinopril-hydrochlorothiazide (ZESTORETIC) 20-12.5 MG tablet    MYOCARDIAL PERFUSION IMAGING    PCV ECHOCARDIOGRAM COMPLETE    2. Carotid stenosis, right  I65.21 lisinopril-hydrochlorothiazide (ZESTORETIC) 20-12.5 MG tablet    MYOCARDIAL  PERFUSION IMAGING    PCV ECHOCARDIOGRAM COMPLETE    3. Stenosis of right carotid artery  I65.21 lisinopril-hydrochlorothiazide (ZESTORETIC) 20-12.5 MG tablet    MYOCARDIAL PERFUSION IMAGING    PCV ECHOCARDIOGRAM COMPLETE    4. Coronary artery disease of native artery of native heart with stable angina pectoris (HCC)  I25.118 lisinopril-hydrochlorothiazide (ZESTORETIC) 20-12.5 MG tablet    MYOCARDIAL PERFUSION IMAGING    PCV ECHOCARDIOGRAM COMPLETE    5. Coronary artery disease due to lipid rich plaque  I25.10 lisinopril-hydrochlorothiazide (ZESTORETIC) 20-12.5 MG tablet   I25.83 MYOCARDIAL PERFUSION IMAGING    PCV ECHOCARDIOGRAM COMPLETE    6. S/P CABG x 4  Z95.1 lisinopril-hydrochlorothiazide (ZESTORETIC) 20-12.5 MG tablet    MYOCARDIAL PERFUSION IMAGING    PCV ECHOCARDIOGRAM COMPLETE    7. SOB (shortness of breath)  R06.02 lisinopril-hydrochlorothiazide (ZESTORETIC) 20-12.5 MG tablet    MYOCARDIAL PERFUSION IMAGING    PCV ECHOCARDIOGRAM COMPLETE    8. Mixed hyperlipidemia  E78.2 lisinopril-hydrochlorothiazide (ZESTORETIC) 20-12.5 MG tablet    MYOCARDIAL PERFUSION IMAGING    PCV ECHOCARDIOGRAM COMPLETE       Problem List Items Addressed This Visit       Cardiovascular and Mediastinum   CAD (coronary artery disease), native coronary artery   Relevant Medications   lisinopril-hydrochlorothiazide (ZESTORETIC) 20-12.5 MG tablet   Other Relevant Orders   MYOCARDIAL PERFUSION IMAGING   PCV ECHOCARDIOGRAM COMPLETE   CAD (coronary artery disease)   Relevant Medications    lisinopril-hydrochlorothiazide (ZESTORETIC) 20-12.5 MG tablet   Other Relevant Orders   MYOCARDIAL PERFUSION IMAGING   PCV ECHOCARDIOGRAM COMPLETE   MYOCARDIAL PERFUSION IMAGING   PCV ECHOCARDIOGRAM COMPLETE   Essential hypertension - Primary   Relevant Medications   lisinopril-hydrochlorothiazide (ZESTORETIC) 20-12.5 MG tablet   Other Relevant Orders   MYOCARDIAL PERFUSION IMAGING   PCV ECHOCARDIOGRAM COMPLETE   Carotid stenosis   Relevant Medications   lisinopril-hydrochlorothiazide (ZESTORETIC) 20-12.5 MG tablet   Other Relevant Orders   MYOCARDIAL PERFUSION IMAGING   PCV ECHOCARDIOGRAM COMPLETE   Carotid stenosis, right   Relevant Medications   lisinopril-hydrochlorothiazide (ZESTORETIC) 20-12.5 MG tablet   Other Relevant Orders   MYOCARDIAL PERFUSION IMAGING   PCV ECHOCARDIOGRAM COMPLETE     Other   S/P CABG x 4   Relevant Medications   lisinopril-hydrochlorothiazide (ZESTORETIC) 20-12.5 MG tablet   Other Relevant Orders   MYOCARDIAL PERFUSION IMAGING   PCV ECHOCARDIOGRAM COMPLETE   Mixed hyperlipidemia   Relevant Medications   lisinopril-hydrochlorothiazide (ZESTORETIC) 20-12.5 MG tablet   Other Relevant Orders   MYOCARDIAL PERFUSION IMAGING   PCV ECHOCARDIOGRAM COMPLETE   SOB (shortness of breath)   Relevant Medications   lisinopril-hydrochlorothiazide (ZESTORETIC) 20-12.5 MG tablet   Other Relevant Orders   MYOCARDIAL PERFUSION IMAGING   PCV ECHOCARDIOGRAM COMPLETE       Disposition:   Return in about 2 weeks (around 08/20/2023) for echo, stress test and f/u.    Total time spent: 30 minutes  Signed,  Adrian Blackwater, MD  08/06/2023 10:17 AM    [facility]

## 2023-08-07 ENCOUNTER — Other Ambulatory Visit: Payer: Self-pay | Admitting: Cardiovascular Disease

## 2023-08-07 DIAGNOSIS — E782 Mixed hyperlipidemia: Secondary | ICD-10-CM

## 2023-08-07 DIAGNOSIS — I2510 Atherosclerotic heart disease of native coronary artery without angina pectoris: Secondary | ICD-10-CM

## 2023-08-07 DIAGNOSIS — Z951 Presence of aortocoronary bypass graft: Secondary | ICD-10-CM

## 2023-08-07 DIAGNOSIS — I10 Essential (primary) hypertension: Secondary | ICD-10-CM

## 2023-08-07 DIAGNOSIS — R0602 Shortness of breath: Secondary | ICD-10-CM

## 2023-08-07 DIAGNOSIS — I6521 Occlusion and stenosis of right carotid artery: Secondary | ICD-10-CM

## 2023-08-07 DIAGNOSIS — I25118 Atherosclerotic heart disease of native coronary artery with other forms of angina pectoris: Secondary | ICD-10-CM

## 2023-08-21 ENCOUNTER — Ambulatory Visit (INDEPENDENT_AMBULATORY_CARE_PROVIDER_SITE_OTHER): Payer: BC Managed Care – PPO

## 2023-08-21 DIAGNOSIS — Z951 Presence of aortocoronary bypass graft: Secondary | ICD-10-CM

## 2023-08-21 DIAGNOSIS — I10 Essential (primary) hypertension: Secondary | ICD-10-CM

## 2023-08-21 DIAGNOSIS — I25118 Atherosclerotic heart disease of native coronary artery with other forms of angina pectoris: Secondary | ICD-10-CM | POA: Diagnosis not present

## 2023-08-21 DIAGNOSIS — I6521 Occlusion and stenosis of right carotid artery: Secondary | ICD-10-CM | POA: Diagnosis not present

## 2023-08-21 DIAGNOSIS — I2510 Atherosclerotic heart disease of native coronary artery without angina pectoris: Secondary | ICD-10-CM

## 2023-08-21 DIAGNOSIS — R0602 Shortness of breath: Secondary | ICD-10-CM

## 2023-08-21 DIAGNOSIS — E782 Mixed hyperlipidemia: Secondary | ICD-10-CM

## 2023-08-21 MED ORDER — TECHNETIUM TC 99M SESTAMIBI GENERIC - CARDIOLITE
10.2000 | Freq: Once | INTRAVENOUS | Status: AC | PRN
  Administered 2023-08-21: 10.2 via INTRAVENOUS

## 2023-08-21 MED ORDER — TECHNETIUM TC 99M SESTAMIBI GENERIC - CARDIOLITE
32.4000 | Freq: Once | INTRAVENOUS | Status: AC | PRN
  Administered 2023-08-21: 32.4 via INTRAVENOUS

## 2023-08-23 ENCOUNTER — Ambulatory Visit: Payer: BC Managed Care – PPO

## 2023-08-23 DIAGNOSIS — I10 Essential (primary) hypertension: Secondary | ICD-10-CM

## 2023-08-23 DIAGNOSIS — I371 Nonrheumatic pulmonary valve insufficiency: Secondary | ICD-10-CM

## 2023-08-23 DIAGNOSIS — Z951 Presence of aortocoronary bypass graft: Secondary | ICD-10-CM

## 2023-08-23 DIAGNOSIS — R0602 Shortness of breath: Secondary | ICD-10-CM

## 2023-08-23 DIAGNOSIS — I340 Nonrheumatic mitral (valve) insufficiency: Secondary | ICD-10-CM | POA: Diagnosis not present

## 2023-08-23 DIAGNOSIS — I351 Nonrheumatic aortic (valve) insufficiency: Secondary | ICD-10-CM | POA: Diagnosis not present

## 2023-08-23 DIAGNOSIS — E782 Mixed hyperlipidemia: Secondary | ICD-10-CM

## 2023-08-23 DIAGNOSIS — I2510 Atherosclerotic heart disease of native coronary artery without angina pectoris: Secondary | ICD-10-CM

## 2023-08-23 DIAGNOSIS — I361 Nonrheumatic tricuspid (valve) insufficiency: Secondary | ICD-10-CM

## 2023-08-23 DIAGNOSIS — I6521 Occlusion and stenosis of right carotid artery: Secondary | ICD-10-CM

## 2023-08-23 DIAGNOSIS — I25118 Atherosclerotic heart disease of native coronary artery with other forms of angina pectoris: Secondary | ICD-10-CM

## 2023-08-28 ENCOUNTER — Encounter: Payer: Self-pay | Admitting: Cardiovascular Disease

## 2023-08-28 ENCOUNTER — Ambulatory Visit: Payer: BC Managed Care – PPO | Admitting: Cardiovascular Disease

## 2023-08-28 VITALS — BP 130/79 | HR 56 | Ht 66.0 in | Wt 190.8 lb

## 2023-08-28 DIAGNOSIS — I10 Essential (primary) hypertension: Secondary | ICD-10-CM

## 2023-08-28 DIAGNOSIS — I6521 Occlusion and stenosis of right carotid artery: Secondary | ICD-10-CM

## 2023-08-28 DIAGNOSIS — I25118 Atherosclerotic heart disease of native coronary artery with other forms of angina pectoris: Secondary | ICD-10-CM | POA: Diagnosis not present

## 2023-08-28 DIAGNOSIS — I5033 Acute on chronic diastolic (congestive) heart failure: Secondary | ICD-10-CM

## 2023-08-28 DIAGNOSIS — E782 Mixed hyperlipidemia: Secondary | ICD-10-CM

## 2023-08-28 DIAGNOSIS — R0602 Shortness of breath: Secondary | ICD-10-CM

## 2023-08-28 DIAGNOSIS — Z951 Presence of aortocoronary bypass graft: Secondary | ICD-10-CM

## 2023-08-28 MED ORDER — DAPAGLIFLOZIN PROPANEDIOL 10 MG PO TABS
10.0000 mg | ORAL_TABLET | Freq: Every day | ORAL | 3 refills | Status: DC
Start: 2023-08-28 — End: 2024-05-01

## 2023-08-28 NOTE — Progress Notes (Signed)
Cardiology Office Note   Date:  08/28/2023   ID:  Nicholas Torres, DOB [birth year], MRN [id number]  PCP:  Carlean Jews, PA-C  Cardiologist:  Adrian Blackwater, MD      History of Present Illness: Nicholas Torres is a 62 y.o. male who presents for No chief complaint on file.   HPI    Past Medical History:  Diagnosis Date   Arthritis    back   CAD (coronary artery disease) 2017   CHF (congestive heart failure) (HCC)    High cholesterol    Hypertension    Myocardial infarction [facility]) 2017   Pollen allergy      Past Surgical History:  Procedure Laterality Date   CARDIAC CATHETERIZATION Right 07/24/2016   Procedure: Left Heart Cath and Coronary Angiography;  Surgeon: Laurier Nancy, MD;  Location: [facility];  Service: Cardiovascular;  Laterality: Right;   CORONARY ARTERY BYPASS GRAFT N/A 07/26/2016   Procedure: CORONARY ARTERY BYPASS GRAFTING (CABG), ON PUMP, TIMES FOUR, USING BILATERAL INTERNAL MAMMARY ARTERIES, RIGHT GREATER SAPHENOUS VEIN HARVESTED ENDOSCOPICALLY;  Surgeon: Loreli Slot, MD;  Location: [facility] OR;  Service: Open Heart Surgery;  Laterality: N/A;   ENDARTERECTOMY Right 01/22/2020   Procedure: ENDARTERECTOMY CAROTID;  Surgeon: Annice Needy, MD;  Location: [facility] ORS;  Service: Vascular;  Laterality: Right;   INGUINAL HERNIA REPAIR Right 04/16/2020   Procedure: HERNIA REPAIR INGUINAL ADULT;  Surgeon: Duanne Guess, MD;  Location: [facility] ORS;  Service: General;  Laterality: Right;   RADIAL ARTERY HARVEST Left 07/26/2016   Procedure: RADIAL LEFT ARTERY HARVEST;  Surgeon: Loreli Slot, MD;  Location: [facility] OR;  Service: Open Heart Surgery;  Laterality: Left;   TEE WITHOUT CARDIOVERSION N/A 07/26/2016   Procedure: TRANSESOPHAGEAL ECHOCARDIOGRAM (TEE);  Surgeon: Loreli Slot, MD;  Location: [facility] OR;  Service: Open Heart Surgery;  Laterality: N/A;     Current Outpatient Medications  Medication Sig Dispense Refill   dapagliflozin propanediol  (FARXIGA) 10 MG TABS tablet Take 1 tablet (10 mg total) by mouth daily before breakfast. 30 tablet 3   AMBULATORY NON FORMULARY MEDICATION Trimix (30/1/10)-(Pap/Phent/PGE)  Test Dose  1ml vial   Qty #3 Refills 0  [facility] [phone] Fax [phone] 3 mL 0   ASPIRIN LOW DOSE 81 MG tablet TAKE 1 TABLET BY MOUTH EVERY DAY 90 tablet 3   clopidogrel (PLAVIX) 75 MG tablet TAKE 1 TABLET BY MOUTH EVERY DAY 90 tablet 1   cyclobenzaprine (FLEXERIL) 10 MG tablet TAKE 1 TABLET BY MOUTH AT BEDTIME FOR NECK SPASMS 30 tablet 2   fluticasone (FLONASE) 50 MCG/ACT nasal spray Place 1 spray into both nostrils daily. 48 mL 1   levothyroxine (SYNTHROID) 25 MCG tablet TAKE 1 TAB DAILY BEFORE BREAKFAST ON EMPTY STOMACH, WAIT 30 MINUTES AFTER TAKING MEDICATION TO EAT OR TAKE OTHER MEDICATIONS. 90 tablet 0   lisinopril-hydrochlorothiazide (ZESTORETIC) 20-12.5 MG tablet TAKE 1 TABLET BY MOUTH EVERY DAY 90 tablet 1   metoprolol succinate (TOPROL-XL) 25 MG 24 hr tablet TAKE 1/2 TABLET BY MOUTH EVERY DAY AS INSTRUCTED 45 tablet 0   pantoprazole (PROTONIX) 20 MG tablet TAKE 1 TABLET BY MOUTH EVERY DAY 90 tablet 1   rosuvastatin (CRESTOR) 40 MG tablet TAKE 1 TABLET BY MOUTH EVERY DAY 90 tablet 1   spironolactone (ALDACTONE) 25 MG tablet Take 25 mg by mouth daily.     tadalafil (CIALIS) 20 MG tablet Take 1 tablet (20 mg total) by mouth daily as needed for erectile  dysfunction. 30 tablet 3   testosterone (ANDROGEL) 50 MG/5GM (1%) GEL Place 5 g onto the skin daily. 150 g 3   No current facility-administered medications for this visit.    Allergies:   Patient has no known allergies.    Social History:   reports that he has never smoked. He has never been exposed to tobacco smoke. He has never used smokeless tobacco. He reports that he does not drink alcohol and does not use drugs.   Family History:  family history includes CAD in his brother; Diabetes in his mother; Heart attack in his father.    ROS:      Review of Systems  Constitutional: Negative.   HENT: Negative.    Eyes: Negative.   Respiratory: Negative.    Gastrointestinal: Negative.   Genitourinary: Negative.   Musculoskeletal: Negative.   Skin: Negative.   Neurological: Negative.   Endo/Heme/Allergies: Negative.   Psychiatric/Behavioral: Negative.    All other systems reviewed and are negative.     All other systems are reviewed and negative.    PHYSICAL EXAM: VS:  BP 130/79   Pulse (!) 56   Ht 5\' 6"  (1.676 m)   Wt 190 lb 12.8 oz (86.5 kg)   SpO2 98%   BMI 30.80 kg/m  , BMI Body mass index is 30.8 kg/m. Last weight:  Wt Readings from Last 3 Encounters:  08/28/23 190 lb 12.8 oz (86.5 kg)  08/06/23 187 lb (84.8 kg)  07/23/23 187 lb 3.2 oz (84.9 kg)     Physical Exam Vitals reviewed.  Constitutional:      Appearance: Normal appearance. He is normal weight.  HENT:     Head: Normocephalic.     Nose: Nose normal.     Mouth/Throat:     Mouth: Mucous membranes are moist.  Eyes:     Pupils: Pupils are equal, round, and reactive to light.  Cardiovascular:     Rate and Rhythm: Normal rate and regular rhythm.     Pulses: Normal pulses.     Heart sounds: Normal heart sounds.  Pulmonary:     Effort: Pulmonary effort is normal.  Abdominal:     General: Abdomen is flat. Bowel sounds are normal.  Musculoskeletal:        General: Normal range of motion.     Cervical back: Normal range of motion.  Skin:    General: Skin is warm.  Neurological:     General: No focal deficit present.     Mental Status: He is alert.  Psychiatric:        Mood and Affect: Mood normal.       EKG:   Recent Labs: No results found for requested labs within last 365 days.    Lipid Panel    Component Value Date/Time   CHOL 111 11/11/2020 0808   TRIG 74 11/11/2020 0808   HDL 58 11/11/2020 0808   CHOLHDL 1.9 11/11/2020 0808   VLDL 17 07/24/2016 0612   LDLCALC 38 11/11/2020 0808      Other studies  Reviewed: Additional studies/ records that were reviewed today include:  Review of the above records demonstrates:       No data to display            ASSESSMENT AND PLAN:    ICD-10-CM   1. CHF (congestive heart failure), NYHA class III, acute on chronic, diastolic (HCC)  I50.33 dapagliflozin propanediol (FARXIGA) 10 MG TABS tablet   add farxiga    2.  Essential hypertension  I10     3. Stenosis of right carotid artery  I65.21     4. Coronary artery disease of native artery of native heart with stable angina pectoris (HCC)  I25.118     5. S/P CABG x 4  Z95.1    stress test normal    6. SOB (shortness of breath)  R06.02    CLEARED FOR DOT AS NORMAL LVEF AND STRESS TEST    7. Mixed hyperlipidemia  E78.2        Problem List Items Addressed This Visit       Cardiovascular and Mediastinum   CAD (coronary artery disease)   Essential hypertension   Carotid stenosis     Other   S/P CABG x 4   Mixed hyperlipidemia   SOB (shortness of breath)   Other Visit Diagnoses     CHF (congestive heart failure), NYHA class III, acute on chronic, diastolic (HCC)    -  Primary   add farxiga   Relevant Medications   dapagliflozin propanediol (FARXIGA) 10 MG TABS tablet          Disposition:   Return in about 3 weeks (around 09/18/2023).    Total time spent: 35 minutes  Signed,  Adrian Blackwater, MD  08/28/2023 10:10 AM    [facility]

## 2023-08-30 ENCOUNTER — Ambulatory Visit: Payer: BC Managed Care – PPO | Admitting: Cardiovascular Disease

## 2023-09-06 ENCOUNTER — Other Ambulatory Visit (INDEPENDENT_AMBULATORY_CARE_PROVIDER_SITE_OTHER): Payer: Self-pay | Admitting: Vascular Surgery

## 2023-09-06 DIAGNOSIS — I6522 Occlusion and stenosis of left carotid artery: Secondary | ICD-10-CM

## 2023-09-10 ENCOUNTER — Other Ambulatory Visit: Payer: Self-pay

## 2023-09-10 ENCOUNTER — Telehealth: Payer: Self-pay

## 2023-09-10 DIAGNOSIS — Z1211 Encounter for screening for malignant neoplasm of colon: Secondary | ICD-10-CM

## 2023-09-10 MED ORDER — NA SULFATE-K SULFATE-MG SULF 17.5-3.13-1.6 GM/177ML PO SOLN: 1.0000 | Freq: Once | ORAL | 0 refills | Status: AC

## 2023-09-10 NOTE — Telephone Encounter (Signed)
Gastroenterology Pre-Procedure Review  Request Date: 10/08/23 Requesting Physician: Dr. Allegra Lai  PATIENT REVIEW QUESTIONS: The patient responded to the following health history questions as indicated:    1. Are you having any GI issues? no 2. Do you have a personal history of Polyps? no 3. Do you have a family history of Colon Cancer or Polyps? no 4. Diabetes Mellitus? Yes  5. Joint replacements in the past 12 months?no 6. Major health problems in the past 3 months?no 7. Any artificial heart valves, MVP, or defibrillator?yes (chf cardiac clearance sent to Dr. Adrian Blackwater)    MEDICATIONS & ALLERGIES:    Patient reports the following regarding taking any anticoagulation/antiplatelet therapy:   Plavix, Coumadin, Eliquis, Xarelto, Lovenox, Pradaxa, Brilinta, or Effient? yes (Plavix (Clopidogrel) prescription last written by Dr. Welton Flakes however it was started by Dr. Wyn Quaker.  Pt said to send to Dr. Wyn Quaker. Aspirin? yes (81mg )  Patient confirms/reports the following medications:  Current Outpatient Medications  Medication Sig Dispense Refill   AMBULATORY NON FORMULARY MEDICATION Trimix (30/1/10)-(Pap/Phent/PGE)  Test Dose  1ml vial   Qty #3 Refills 0  [facility] [phone] Fax [phone] 3 mL 0   ASPIRIN LOW DOSE 81 MG tablet TAKE 1 TABLET BY MOUTH EVERY DAY 90 tablet 3   clopidogrel (PLAVIX) 75 MG tablet TAKE 1 TABLET BY MOUTH EVERY DAY 90 tablet 1   cyclobenzaprine (FLEXERIL) 10 MG tablet TAKE 1 TABLET BY MOUTH AT BEDTIME FOR NECK SPASMS 30 tablet 2   dapagliflozin propanediol (FARXIGA) 10 MG TABS tablet Take 1 tablet (10 mg total) by mouth daily before breakfast. 30 tablet 3   fluticasone (FLONASE) 50 MCG/ACT nasal spray Place 1 spray into both nostrils daily. 48 mL 1   levothyroxine (SYNTHROID) 25 MCG tablet TAKE 1 TAB DAILY BEFORE BREAKFAST ON EMPTY STOMACH, WAIT 30 MINUTES AFTER TAKING MEDICATION TO EAT OR TAKE OTHER MEDICATIONS. 90 tablet 0   lisinopril-hydrochlorothiazide  (ZESTORETIC) 20-12.5 MG tablet TAKE 1 TABLET BY MOUTH EVERY DAY 90 tablet 1   metoprolol succinate (TOPROL-XL) 25 MG 24 hr tablet TAKE 1/2 TABLET BY MOUTH EVERY DAY AS INSTRUCTED 45 tablet 0   pantoprazole (PROTONIX) 20 MG tablet TAKE 1 TABLET BY MOUTH EVERY DAY 90 tablet 1   rosuvastatin (CRESTOR) 40 MG tablet TAKE 1 TABLET BY MOUTH EVERY DAY 90 tablet 1   spironolactone (ALDACTONE) 25 MG tablet Take 25 mg by mouth daily.     tadalafil (CIALIS) 20 MG tablet Take 1 tablet (20 mg total) by mouth daily as needed for erectile dysfunction. 30 tablet 3   testosterone (ANDROGEL) 50 MG/5GM (1%) GEL Place 5 g onto the skin daily. 150 g 3   No current facility-administered medications for this visit.    Patient confirms/reports the following allergies:  No Known Allergies  No orders of the defined types were placed in this encounter.   AUTHORIZATION INFORMATION Primary Insurance: 1D#: Group #:  Secondary Insurance: 1D#: Group #:  SCHEDULE INFORMATION: Date: 10/08/23 Time: Location: [facility]

## 2023-09-11 ENCOUNTER — Encounter (INDEPENDENT_AMBULATORY_CARE_PROVIDER_SITE_OTHER): Payer: Self-pay | Admitting: Vascular Surgery

## 2023-09-11 ENCOUNTER — Ambulatory Visit (INDEPENDENT_AMBULATORY_CARE_PROVIDER_SITE_OTHER): Payer: BC Managed Care – PPO

## 2023-09-11 ENCOUNTER — Ambulatory Visit (INDEPENDENT_AMBULATORY_CARE_PROVIDER_SITE_OTHER): Payer: BC Managed Care – PPO | Admitting: Vascular Surgery

## 2023-09-11 VITALS — BP 120/77 | HR 52 | Resp 16 | Ht 66.0 in | Wt 185.2 lb

## 2023-09-11 DIAGNOSIS — I6522 Occlusion and stenosis of left carotid artery: Secondary | ICD-10-CM | POA: Diagnosis not present

## 2023-09-11 DIAGNOSIS — I10 Essential (primary) hypertension: Secondary | ICD-10-CM

## 2023-09-11 DIAGNOSIS — I6521 Occlusion and stenosis of right carotid artery: Secondary | ICD-10-CM

## 2023-09-11 DIAGNOSIS — E782 Mixed hyperlipidemia: Secondary | ICD-10-CM | POA: Diagnosis not present

## 2023-09-11 NOTE — Assessment & Plan Note (Signed)
Duplex today shows a widely patent right carotid endarterectomy and stable 1 to 39% left ICA stenosis.  Current medical regimen which includes aspirin and Plavix as well as Crestor.  Continue follow-up on an annual basis with duplex.

## 2023-09-11 NOTE — Progress Notes (Signed)
MRN : [id number]  Nicholas Torres is a 62 y.o. (09-09-1961) male who presents with chief complaint of  Chief Complaint  Patient presents with   Follow-up    f/u in 1 year with carotid -  .  History of Present Illness: Patient returns in follow-up of his carotid disease.  He is 3 to 4 years status post right carotid endarterectomy for high-grade stenosis.  He is doing well.  He does still have some tightness in his right neck intermittently with certain activities.  No focal neurologic symptoms. Specifically, the patient denies amaurosis fugax, speech or swallowing difficulties, or arm or leg weakness or numbness.  Duplex today shows a widely patent right carotid endarterectomy and stable 1 to 39% left ICA stenosis.  Current Outpatient Medications  Medication Sig Dispense Refill   AMBULATORY NON FORMULARY MEDICATION Trimix (30/1/10)-(Pap/Phent/PGE)  Test Dose  1ml vial   Qty #3 Refills 0  [facility] [phone] Fax [phone] 3 mL 0   ASPIRIN LOW DOSE 81 MG tablet TAKE 1 TABLET BY MOUTH EVERY DAY 90 tablet 3   clopidogrel (PLAVIX) 75 MG tablet TAKE 1 TABLET BY MOUTH EVERY DAY 90 tablet 1   cyclobenzaprine (FLEXERIL) 10 MG tablet TAKE 1 TABLET BY MOUTH AT BEDTIME FOR NECK SPASMS 30 tablet 2   dapagliflozin propanediol (FARXIGA) 10 MG TABS tablet Take 1 tablet (10 mg total) by mouth daily before breakfast. 30 tablet 3   fluticasone (FLONASE) 50 MCG/ACT nasal spray Place 1 spray into both nostrils daily. 48 mL 1   levothyroxine (SYNTHROID) 25 MCG tablet TAKE 1 TAB DAILY BEFORE BREAKFAST ON EMPTY STOMACH, WAIT 30 MINUTES AFTER TAKING MEDICATION TO EAT OR TAKE OTHER MEDICATIONS. 90 tablet 0   lisinopril-hydrochlorothiazide (ZESTORETIC) 20-12.5 MG tablet TAKE 1 TABLET BY MOUTH EVERY DAY 90 tablet 1   metoprolol succinate (TOPROL-XL) 25 MG 24 hr tablet TAKE 1/2 TABLET BY MOUTH EVERY DAY AS INSTRUCTED 45 tablet 0   metoprolol tartrate (LOPRESSOR) 50 MG tablet Take 0.5 tablets by  mouth daily.     pantoprazole (PROTONIX) 20 MG tablet TAKE 1 TABLET BY MOUTH EVERY DAY 90 tablet 1   rosuvastatin (CRESTOR) 40 MG tablet TAKE 1 TABLET BY MOUTH EVERY DAY 90 tablet 1   spironolactone (ALDACTONE) 25 MG tablet Take 25 mg by mouth daily.     tadalafil (CIALIS) 20 MG tablet Take 1 tablet (20 mg total) by mouth daily as needed for erectile dysfunction. 30 tablet 3   testosterone (ANDROGEL) 50 MG/5GM (1%) GEL Place 5 g onto the skin daily. 150 g 3   No current facility-administered medications for this visit.    Past Medical History:  Diagnosis Date   Arthritis    back   CAD (coronary artery disease) 2017   CHF (congestive heart failure) (HCC)    High cholesterol    Hypertension    Myocardial infarction [facility]) 2017   Pollen allergy     Past Surgical History:  Procedure Laterality Date   CARDIAC CATHETERIZATION Right 07/24/2016   Procedure: Left Heart Cath and Coronary Angiography;  Surgeon: Laurier Nancy, MD;  Location: [facility] INVASIVE CV LAB;  Service: Cardiovascular;  Laterality: Right;   CORONARY ARTERY BYPASS GRAFT N/A 07/26/2016   Procedure: CORONARY ARTERY BYPASS GRAFTING (CABG), ON PUMP, TIMES FOUR, USING BILATERAL INTERNAL MAMMARY ARTERIES, RIGHT GREATER SAPHENOUS VEIN HARVESTED ENDOSCOPICALLY;  Surgeon: Loreli Slot, MD;  Location: [facility] OR;  Service: Open Heart Surgery;  Laterality: N/A;   ENDARTERECTOMY Right 01/22/2020  Procedure: ENDARTERECTOMY CAROTID;  Surgeon: Annice Needy, MD;  Location: [facility] ORS;  Service: Vascular;  Laterality: Right;   INGUINAL HERNIA REPAIR Right 04/16/2020   Procedure: HERNIA REPAIR INGUINAL ADULT;  Surgeon: Duanne Guess, MD;  Location: [facility] ORS;  Service: General;  Laterality: Right;   RADIAL ARTERY HARVEST Left 07/26/2016   Procedure: RADIAL LEFT ARTERY HARVEST;  Surgeon: Loreli Slot, MD;  Location: [facility] OR;  Service: Open Heart Surgery;  Laterality: Left;   TEE WITHOUT CARDIOVERSION N/A 07/26/2016   Procedure:  TRANSESOPHAGEAL ECHOCARDIOGRAM (TEE);  Surgeon: Loreli Slot, MD;  Location: [facility] OR;  Service: Open Heart Surgery;  Laterality: N/A;     Social History   Tobacco Use   Smoking status: Never    Passive exposure: Never   Smokeless tobacco: Never  Vaping Use   Vaping status: Never Used  Substance Use Topics   Alcohol use: No   Drug use: No       Family History  Problem Relation Age of Onset   Diabetes Mother    Heart attack Father    CAD Brother      No Known Allergies   REVIEW OF SYSTEMS (Negative unless checked)  Constitutional: [] Weight loss  [] Fever  [] Chills Cardiac: [] Chest pain   [] Chest pressure   [] Palpitations   [] Shortness of breath when laying flat   [] Shortness of breath at rest   [x] Shortness of breath with exertion. Vascular:  [] Pain in legs with walking   [] Pain in legs at rest   [] Pain in legs when laying flat   [] Claudication   [] Pain in feet when walking  [] Pain in feet at rest  [] Pain in feet when laying flat   [] History of DVT   [] Phlebitis   [] Swelling in legs   [] Varicose veins   [] Non-healing ulcers Pulmonary:   [] Uses home oxygen   [] Productive cough   [] Hemoptysis   [] Wheeze  [] COPD   [] Asthma Neurologic:  [] Dizziness  [] Blackouts   [] Seizures   [] History of stroke   [] History of TIA  [] Aphasia   [] Temporary blindness   [] Dysphagia   [] Weakness or numbness in arms   [] Weakness or numbness in legs Musculoskeletal:  [] Arthritis   [] Joint swelling   [] Joint pain   [x] Low back pain Hematologic:  [] Easy bruising  [] Easy bleeding   [] Hypercoagulable state   [] Anemic  [] Hepatitis Gastrointestinal:  [] Blood in stool   [] Vomiting blood  [] Gastroesophageal reflux/heartburn   [] Difficulty swallowing. Genitourinary:  [] Chronic kidney disease   [] Difficult urination  [] Frequent urination  [] Burning with urination   [] Blood in urine Skin:  [] Rashes   [] Ulcers   [] Wounds Psychological:  [] History of anxiety   []  History of major depression.  Physical  Examination  Vitals:   09/11/23 1005  BP: 120/77  Pulse: (!) 52  Resp: 16  Weight: 185 lb 3.2 oz (84 kg)  Height: 5\' 6"  (1.676 m)   Body mass index is 29.89 kg/m. Gen:  WD/WN, NAD. Appears younger than stated age. Head: [place]/AT, No temporalis wasting. Ear/Nose/Throat: Hearing diminished, hearing aids in place, nares w/o erythema or drainage, trachea midline Eyes: Conjunctiva clear. Sclera non-icteric Neck: Supple.  No bruit  Pulmonary:  Good air movement, equal and clear to auscultation bilaterally.  Cardiac: RRR, No JVD Vascular:  Vessel Right Left  Radial Palpable Palpable           Musculoskeletal: M/S 5/5 throughout.  No deformity or atrophy.  No edema. Neurologic: CN 2-12 intact. Sensation grossly intact in  extremities.  Symmetrical.  Speech is fluent. Motor exam as listed above. Psychiatric: Judgment intact, Mood & affect appropriate for pt's clinical situation. Dermatologic: No rashes or ulcers noted.  No cellulitis or open wounds.  Right carotid endarterectomy incision is well-healed.    CBC Lab Results  Component Value Date   WBC 4.8 11/11/2020   HGB 13.8 05/29/2022   HCT 42.0 05/29/2022   MCV 93.8 11/11/2020   PLT 130 (L) 11/11/2020    BMET    Component Value Date/Time   NA 141 11/11/2020 0808   K 3.2 (L) 11/11/2020 0808   CL 104 11/11/2020 0808   CO2 31 11/11/2020 0808   GLUCOSE 86 11/11/2020 0808   BUN 18 11/11/2020 0808   CREATININE 1.24 11/11/2020 0808   CALCIUM 9.3 11/11/2020 0808   GFRNONAA 64 11/11/2020 0808   GFRAA 74 11/11/2020 0808   CrCl cannot be calculated (Patient's most recent lab result is older than the maximum 21 days allowed.).  COAG Lab Results  Component Value Date   INR 1.1 01/20/2020   INR 1.18 08/11/2016   INR 1.55 07/26/2016    Radiology PCV ECHOCARDIOGRAM COMPLETE  Result Date: 08/24/2023 Images from the original result were not included. Reason for Visit  INDICATIONS:  I10 Echocardiogram: An echocardiogram in  (2-d) mode was performed and in Doppler mode with color flow velocity mapping was performed. ventricular septum thickness 1.42 cm, L ventricular posterior wall thickness (diastole) 1.63 cm, left atrium size 4.2 cm, aortic root diameter 4.4 cm, L ventricle diastolic dimension 4.17 cm, L ventricle systolic dimension 2.67, L ventricle ejection fraction 66 %, and LV fractional shortening 36 % L ventricular outflow tract internal diameter 3.4 cm, L ventricular outflow tract flow velocity .963 m/s, aortic valve cusps 2.5 cm , aortic valve flow velocity 1.21 (m/sec), aortic valve systolic calculated mean flow gradient 3 mmHg, mitral valve diastolic peak flow velocity E .859 m/sec, and mitral valve diastolic peak flow E/A ratio 1.0 Mitral valve has mild regurgitation Aortic valve has  mild regurgitation Pulmonic valve has trace regurgitation Tricuspid valve has trace regurgitation ASSESSMENT Technically adequate study. Normal left ventricular systolic function. Normal left ventricular diastolic function. Normal right ventricular systolic function. Normal right ventricular diastolic function. Normal left ventricular wall motion. Normal right ventricular wall motion. Trace pulmonary regurgitation. Trace tricuspid regurgitation. Moderate pulmonary hypertension. Mild mitral regurgitation. Mild aortic regurgitation. No pericardial effusion. Mildly dilated left atrium Mild left ventricle hypertrophy   MYOCARDIAL PERFUSION IMAGING  Result Date: 08/23/2023 Images from the original result were not included. STUDY: Gated Stress / Rest Myocardial Perfusion Imaging Tomographic (SPECT) Including attenuation correction Wall Motion, Left Ventricular Ejection Fraction By GatedTechnique. Treadmill Stress Test SEX: male  ARMS UP: Yes INDICATION FOR STUDY:R06.09 Technique: Approximately 20 minutes following the Intravenous administration  of 10.2 mCi Tc-36m Sestamibi after stress testing in a reclined supine position with arms above  their head if able to do so, gated SPECT imaging of the heart was performed. After about a 2hr break, the patient was injected intravenously with 32.4 mCi of Tc-83m Sestamibi. Approximately 45 minutes later in the same position as stress imaging SPECT rest imaging of the heart was performed. STRESSED BY:  Adrian Blackwater, MD PROTOCOL:  Smitty Cords      ROUTE OF ADMINISTRATION: IV MAX PRED HR: 160  85%: 136                  75%: 120 RESTING BP:  143/89 RESTING HR:  61 PEAK BP: 166/100  PEAK HR: 184 EXERCISE DURATION: 7:01    METS: 7.0 REASON FOR TEST TERMINATION: Fatigue SYMPTOMS: Fatigue DUKE TREADMILL SCORE:  7    RISK: Low  EKG RESULTS: NSR. 61/min. Incomplete RBBB. No significant ST changes at peak exercise. IMAGE QUALITY: Good PERFUSION/WALL MOTION FINDINGS: EF = 67%. No perfusion defects, normal wall motion.   Normal stress test with normal LVEF.  Adrian Blackwater, MD Stress Interpreting Physician / Nuclear Interpreting Physician     Assessment/Plan Carotid stenosis Duplex today shows a widely patent right carotid endarterectomy and stable 1 to 39% left ICA stenosis.  Current medical regimen which includes aspirin and Plavix as well as Crestor.  Continue follow-up on an annual basis with duplex.  Mixed hyperlipidemia lipid control important in reducing the progression of atherosclerotic disease. Continue statin therapy   Essential hypertension blood pressure control important in reducing the progression of atherosclerotic disease. On appropriate oral medications.    Festus Barren, MD  09/11/2023 10:46 AM    This note was created with Dragon medical transcription system.  Any errors from dictation are purely unintentional

## 2023-09-11 NOTE — Assessment & Plan Note (Signed)
lipid control important in reducing the progression of atherosclerotic disease. Continue statin therapy  

## 2023-09-11 NOTE — Assessment & Plan Note (Signed)
blood pressure control important in reducing the progression of atherosclerotic disease. On appropriate oral medications.  

## 2023-09-17 ENCOUNTER — Telehealth: Payer: Self-pay

## 2023-09-17 ENCOUNTER — Other Ambulatory Visit: Payer: Self-pay | Admitting: Physician Assistant

## 2023-09-17 NOTE — Telephone Encounter (Signed)
Cardiac clearance has been granted for patient to have colonoscopy as scheduled 10/08/23. Per Fax received from Dr. Welton Flakes.  Pt has been notified clearance was received but blood thinner advice not received yet from Dr. Wyn Quaker.  During Triage patient asked to have blood thinner advice sent to Dr. Wyn Quaker.  He has now asked me to send it to Dr. Milta Deiters office because Dr.Khan was the one that wanted him to remain on it.  Blood thinner request has been sent to Dr. Milta Deiters.  Thanks, [place], [place]

## 2023-09-18 ENCOUNTER — Telehealth: Payer: Self-pay

## 2023-09-18 NOTE — Telephone Encounter (Signed)
Received blood thinner advice request back from Dr. Milta Deiters office.  Per fax patient has been advised to stop Plavix 6 days prior to colonoscopy. Pt verbalized understanding.  Thanks, [place], [place]

## 2023-09-20 ENCOUNTER — Encounter: Payer: Self-pay | Admitting: Cardiovascular Disease

## 2023-09-20 ENCOUNTER — Ambulatory Visit: Payer: BC Managed Care – PPO | Admitting: Cardiovascular Disease

## 2023-09-20 ENCOUNTER — Telehealth: Payer: Self-pay

## 2023-09-20 VITALS — BP 112/80 | HR 98 | Ht 66.0 in | Wt 186.2 lb

## 2023-09-20 DIAGNOSIS — I6521 Occlusion and stenosis of right carotid artery: Secondary | ICD-10-CM | POA: Diagnosis not present

## 2023-09-20 DIAGNOSIS — E782 Mixed hyperlipidemia: Secondary | ICD-10-CM

## 2023-09-20 DIAGNOSIS — I10 Essential (primary) hypertension: Secondary | ICD-10-CM

## 2023-09-20 DIAGNOSIS — I5033 Acute on chronic diastolic (congestive) heart failure: Secondary | ICD-10-CM | POA: Diagnosis not present

## 2023-09-20 DIAGNOSIS — R0602 Shortness of breath: Secondary | ICD-10-CM

## 2023-09-20 DIAGNOSIS — Z951 Presence of aortocoronary bypass graft: Secondary | ICD-10-CM

## 2023-09-20 NOTE — Progress Notes (Signed)
Cardiology Office Note   Date:  09/20/2023   ID:  Nicholas Torres, DOB July 23, 1961, MRN [id number]  PCP:  Carlean Jews, PA-C  Cardiologist:  Adrian Blackwater, MD      History of Present Illness: Nicholas Torres is a 62 y.o. male who presents for  Chief Complaint  Patient presents with   Follow-up    Doing well      Past Medical History:  Diagnosis Date   Arthritis    back   CAD (coronary artery disease) 2017   CHF (congestive heart failure) (HCC)    High cholesterol    Hypertension    Myocardial infarction [facility]) 2017   Pollen allergy      Past Surgical History:  Procedure Laterality Date   CARDIAC CATHETERIZATION Right 07/24/2016   Procedure: Left Heart Cath and Coronary Angiography;  Surgeon: Laurier Nancy, MD;  Location: [facility] INVASIVE CV LAB;  Service: Cardiovascular;  Laterality: Right;   CORONARY ARTERY BYPASS GRAFT N/A 07/26/2016   Procedure: CORONARY ARTERY BYPASS GRAFTING (CABG), ON PUMP, TIMES FOUR, USING BILATERAL INTERNAL MAMMARY ARTERIES, RIGHT GREATER SAPHENOUS VEIN HARVESTED ENDOSCOPICALLY;  Surgeon: Loreli Slot, MD;  Location: [facility] OR;  Service: Open Heart Surgery;  Laterality: N/A;   ENDARTERECTOMY Right 01/22/2020   Procedure: ENDARTERECTOMY CAROTID;  Surgeon: Annice Needy, MD;  Location: [facility] ORS;  Service: Vascular;  Laterality: Right;   INGUINAL HERNIA REPAIR Right 04/16/2020   Procedure: HERNIA REPAIR INGUINAL ADULT;  Surgeon: Duanne Guess, MD;  Location: [facility] ORS;  Service: General;  Laterality: Right;   RADIAL ARTERY HARVEST Left 07/26/2016   Procedure: RADIAL LEFT ARTERY HARVEST;  Surgeon: Loreli Slot, MD;  Location: [facility] OR;  Service: Open Heart Surgery;  Laterality: Left;   TEE WITHOUT CARDIOVERSION N/A 07/26/2016   Procedure: TRANSESOPHAGEAL ECHOCARDIOGRAM (TEE);  Surgeon: Loreli Slot, MD;  Location: [facility] OR;  Service: Open Heart Surgery;  Laterality: N/A;     Current Outpatient Medications  Medication Sig Dispense  Refill   AMBULATORY NON FORMULARY MEDICATION Trimix (30/1/10)-(Pap/Phent/PGE)  Test Dose  1ml vial   Qty #3 Refills 0  [facility] [phone] Fax [phone] 3 mL 0   ASPIRIN LOW DOSE 81 MG tablet TAKE 1 TABLET BY MOUTH EVERY DAY 90 tablet 3   clopidogrel (PLAVIX) 75 MG tablet TAKE 1 TABLET BY MOUTH EVERY DAY 90 tablet 1   cyclobenzaprine (FLEXERIL) 10 MG tablet TAKE 1 TABLET BY MOUTH AT BEDTIME FOR NECK SPASMS 30 tablet 2   dapagliflozin propanediol (FARXIGA) 10 MG TABS tablet Take 1 tablet (10 mg total) by mouth daily before breakfast. 30 tablet 3   fluticasone (FLONASE) 50 MCG/ACT nasal spray Place 1 spray into both nostrils daily. 48 mL 1   levothyroxine (SYNTHROID) 25 MCG tablet TAKE 1 TAB DAILY BEFORE BREAKFAST ON EMPTY STOMACH, WAIT 30 MINUTES AFTER TAKING MEDICATION TO EAT OR TAKE OTHER MEDICATIONS. 90 tablet 0   lisinopril-hydrochlorothiazide (ZESTORETIC) 20-12.5 MG tablet TAKE 1 TABLET BY MOUTH EVERY DAY 90 tablet 1   metoprolol succinate (TOPROL-XL) 25 MG 24 hr tablet TAKE 1/2 TABLET BY MOUTH EVERY DAY AS INSTRUCTED 45 tablet 0   metoprolol tartrate (LOPRESSOR) 50 MG tablet Take 0.5 tablets by mouth daily.     pantoprazole (PROTONIX) 20 MG tablet TAKE 1 TABLET BY MOUTH EVERY DAY 90 tablet 1   rosuvastatin (CRESTOR) 40 MG tablet TAKE 1 TABLET BY MOUTH EVERY DAY 90 tablet 1   spironolactone (ALDACTONE) 25 MG tablet Take 25 mg  by mouth daily.     tadalafil (CIALIS) 20 MG tablet Take 1 tablet (20 mg total) by mouth daily as needed for erectile dysfunction. 30 tablet 3   testosterone (ANDROGEL) 50 MG/5GM (1%) GEL Place 5 g onto the skin daily. 150 g 3   No current facility-administered medications for this visit.    Allergies:   Patient has no known allergies.    Social History:   reports that he has never smoked. He has never been exposed to tobacco smoke. He has never used smokeless tobacco. He reports that he does not drink alcohol and does not use drugs.    Family History:  family history includes CAD in his brother; Diabetes in his mother; Heart attack in his father.    ROS:     Review of Systems  Constitutional: Negative.   HENT: Negative.    Eyes: Negative.   Respiratory: Negative.    Gastrointestinal: Negative.   Genitourinary: Negative.   Musculoskeletal: Negative.   Skin: Negative.   Neurological: Negative.   Endo/Heme/Allergies: Negative.   Psychiatric/Behavioral: Negative.    All other systems reviewed and are negative.     All other systems are reviewed and negative.    PHYSICAL EXAM: VS:  BP 112/80   Pulse 98   Ht 5\' 6"  (1.676 m)   Wt 186 lb 3.2 oz (84.5 kg)   SpO2 (!) 68%   BMI 30.05 kg/m  , BMI Body mass index is 30.05 kg/m. Last weight:  Wt Readings from Last 3 Encounters:  09/20/23 186 lb 3.2 oz (84.5 kg)  09/11/23 185 lb 3.2 oz (84 kg)  08/28/23 190 lb 12.8 oz (86.5 kg)     Physical Exam Vitals reviewed.  Constitutional:      Appearance: Normal appearance. He is normal weight.  HENT:     Head: Normocephalic.     Nose: Nose normal.     Mouth/Throat:     Mouth: Mucous membranes are moist.  Eyes:     Pupils: Pupils are equal, round, and reactive to light.  Cardiovascular:     Rate and Rhythm: Normal rate and regular rhythm.     Pulses: Normal pulses.     Heart sounds: Normal heart sounds.  Pulmonary:     Effort: Pulmonary effort is normal.  Abdominal:     General: Abdomen is flat. Bowel sounds are normal.  Musculoskeletal:        General: Normal range of motion.     Cervical back: Normal range of motion.  Skin:    General: Skin is warm.  Neurological:     General: No focal deficit present.     Mental Status: He is alert.  Psychiatric:        Mood and Affect: Mood normal.       EKG:   Recent Labs: No results found for requested labs within last 365 days.    Lipid Panel    Component Value Date/Time   CHOL 111 11/11/2020 0808   TRIG 74 11/11/2020 0808   HDL 58 11/11/2020  0808   CHOLHDL 1.9 11/11/2020 0808   VLDL 17 07/24/2016 0612   LDLCALC 38 11/11/2020 0808      Other studies Reviewed: Additional studies/ records that were reviewed today include:  Review of the above records demonstrates:       No data to display            ASSESSMENT AND PLAN:    ICD-10-CM   1. CHF (congestive  heart failure), NYHA class III, acute on chronic, diastolic (HCC)  I50.33    Improved    2. Essential hypertension  I10     3. Stenosis of right carotid artery  I65.21     4. S/P CABG x 4  Z95.1     5. Mixed hyperlipidemia  E78.2     6. SOB (shortness of breath)  R06.02        Problem List Items Addressed This Visit       Cardiovascular and Mediastinum   Essential hypertension   Carotid stenosis     Other   S/P CABG x 4   Mixed hyperlipidemia   SOB (shortness of breath)    Feeling much better      Other Visit Diagnoses     CHF (congestive heart failure), NYHA class III, acute on chronic, diastolic (HCC)    -  Primary   Improved          Disposition:   Return in about 3 months (around 12/21/2023).    Total time spent: 30 minutes  Signed,  Adrian Blackwater, MD  09/20/2023 9:50 AM    [facility]

## 2023-09-20 NOTE — Assessment & Plan Note (Signed)
Feeling much better.  

## 2023-09-20 NOTE — Telephone Encounter (Signed)
Patient has been advised that Dr. Wyn Quaker advised to stop Plavix 5 days prior to colonoscopy so we will have him stop Plavix 5 days prior to colonoscopy not 6 days as previously advised by Dr. Milta Deiters office.  Pt verbalized understanding.  Thanks,  [place], [place]

## 2023-10-08 ENCOUNTER — Encounter
Admission: RE | Disposition: A | Discharge status: Home / Self Care | Payer: Self-pay | Source: Home / Self Care | Attending: Gastroenterology

## 2023-10-08 ENCOUNTER — Ambulatory Visit: Payer: BC Managed Care – PPO | Admitting: General Practice

## 2023-10-08 ENCOUNTER — Encounter: Payer: Self-pay | Admitting: Gastroenterology

## 2023-10-08 ENCOUNTER — Other Ambulatory Visit: Payer: Self-pay

## 2023-10-08 ENCOUNTER — Ambulatory Visit
Admission: RE | Admit: 2023-10-08 | Discharge: 2023-10-08 | Disposition: A | Discharge status: Home / Self Care | Payer: BC Managed Care – PPO | Attending: Gastroenterology | Admitting: Gastroenterology

## 2023-10-08 DIAGNOSIS — Z1211 Encounter for screening for malignant neoplasm of colon: Secondary | ICD-10-CM | POA: Diagnosis present

## 2023-10-08 DIAGNOSIS — I2510 Atherosclerotic heart disease of native coronary artery without angina pectoris: Secondary | ICD-10-CM | POA: Diagnosis not present

## 2023-10-08 DIAGNOSIS — I252 Old myocardial infarction: Secondary | ICD-10-CM | POA: Diagnosis not present

## 2023-10-08 DIAGNOSIS — I110 Hypertensive heart disease with heart failure: Secondary | ICD-10-CM | POA: Insufficient documentation

## 2023-10-08 DIAGNOSIS — I509 Heart failure, unspecified: Secondary | ICD-10-CM | POA: Insufficient documentation

## 2023-10-08 DIAGNOSIS — Z951 Presence of aortocoronary bypass graft: Secondary | ICD-10-CM | POA: Insufficient documentation

## 2023-10-08 DIAGNOSIS — Z7902 Long term (current) use of antithrombotics/antiplatelets: Secondary | ICD-10-CM | POA: Diagnosis not present

## 2023-10-08 HISTORY — PX: COLONOSCOPY WITH PROPOFOL: SHX5780

## 2023-10-08 SURGERY — COLONOSCOPY WITH PROPOFOL
Anesthesia: General

## 2023-10-08 MED ORDER — SODIUM CHLORIDE 0.9 % IV SOLN: INTRAVENOUS | Status: DC

## 2023-10-08 MED ORDER — PROPOFOL 500 MG/50ML IV EMUL
INTRAVENOUS | Status: DC | PRN
  Administered 2023-10-08: 100 mg via INTRAVENOUS
  Administered 2023-10-08: 150 ug/kg/min via INTRAVENOUS

## 2023-10-08 MED ORDER — LIDOCAINE HCL (PF) 2 % IJ SOLN
INTRAMUSCULAR | Status: DC | PRN
  Administered 2023-10-08: 50 mg via INTRADERMAL

## 2023-10-08 NOTE — Transfer of Care (Signed)
Immediate Anesthesia Transfer of Care Note  Patient: Nicholas Torres  Procedure(s) Performed: COLONOSCOPY WITH PROPOFOL  Patient Location: PACU  Anesthesia Type:General  Level of Consciousness: drowsy  Airway & Oxygen Therapy: Patient Spontanous Breathing  Post-op Assessment: Report given to RN and Post -op Vital signs reviewed and stable  Post vital signs: Reviewed and stable  Last Vitals:  Vitals Value Taken Time  BP 98/66 10/08/23 1208  Temp 36.4 C 10/08/23 1207  Pulse 58 10/08/23 1208  Resp 14 10/08/23 1208  SpO2 99 % 10/08/23 1208  Vitals shown include unfiled device data.  Last Pain:  Vitals:   10/08/23 1207  TempSrc: Temporal  PainSc: 0-No pain         Complications: There were no known notable events for this encounter.

## 2023-10-08 NOTE — H&P (Signed)
Nicholas Repress, MD [address]  [address]  [place], [place] [address]  Main: [phone]  Fax: [phone] Pager: [phone]  Primary Care Physician:  Nicholas Jews, PA-C Primary Gastroenterologist:  Dr. Arlyss Torres  Pre-Procedure History & Physical: HPI:  Nicholas Torres is a 62 y.o. male is here for a colonoscopy.   Past Medical History:  Diagnosis Date   Arthritis    back   CAD (coronary artery disease) 2017   CHF (congestive heart failure) (HCC)    High cholesterol    Hypertension    Myocardial infarction [facility]) 2017   Pollen allergy     Past Surgical History:  Procedure Laterality Date   CARDIAC CATHETERIZATION Right 07/24/2016   Procedure: Left Heart Cath and Coronary Angiography;  Surgeon: Laurier Nancy, MD;  Location: [facility] INVASIVE CV LAB;  Service: Cardiovascular;  Laterality: Right;   CORONARY ARTERY BYPASS GRAFT N/A 07/26/2016   Procedure: CORONARY ARTERY BYPASS GRAFTING (CABG), ON PUMP, TIMES FOUR, USING BILATERAL INTERNAL MAMMARY ARTERIES, RIGHT GREATER SAPHENOUS VEIN HARVESTED ENDOSCOPICALLY;  Surgeon: Loreli Slot, MD;  Location: [facility] OR;  Service: Open Heart Surgery;  Laterality: N/A;   ENDARTERECTOMY Right 01/22/2020   Procedure: ENDARTERECTOMY CAROTID;  Surgeon: Annice Needy, MD;  Location: [facility] ORS;  Service: Vascular;  Laterality: Right;   INGUINAL HERNIA REPAIR Right 04/16/2020   Procedure: HERNIA REPAIR INGUINAL ADULT;  Surgeon: Duanne Guess, MD;  Location: [facility] ORS;  Service: General;  Laterality: Right;   RADIAL ARTERY HARVEST Left 07/26/2016   Procedure: RADIAL LEFT ARTERY HARVEST;  Surgeon: Loreli Slot, MD;  Location: [facility] OR;  Service: Open Heart Surgery;  Laterality: Left;   TEE WITHOUT CARDIOVERSION N/A 07/26/2016   Procedure: TRANSESOPHAGEAL ECHOCARDIOGRAM (TEE);  Surgeon: Loreli Slot, MD;  Location: [facility] OR;  Service: Open Heart Surgery;  Laterality: N/A;    Prior to Admission medications   Medication Sig  Start Date End Date Taking? Authorizing Provider  AMBULATORY NON FORMULARY MEDICATION Trimix (30/1/10)-(Pap/Phent/PGE)  Test Dose  1ml vial   Qty #3 Refills 0  [facility] [phone] Fax [phone] 06/06/22  Yes McGowan, Carollee Herter A, PA-C  ASPIRIN LOW DOSE 81 MG tablet TAKE 1 TABLET BY MOUTH EVERY DAY 03/26/23  Yes Adrian Blackwater A, MD  cyclobenzaprine (FLEXERIL) 10 MG tablet TAKE 1 TABLET BY MOUTH AT BEDTIME FOR NECK SPASMS 01/18/22  Yes Lyndon Code, MD  dapagliflozin propanediol (FARXIGA) 10 MG TABS tablet Take 1 tablet (10 mg total) by mouth daily before breakfast. 08/28/23  Yes Laurier Nancy, MD  fluticasone (FLONASE) 50 MCG/ACT nasal spray Place 1 spray into both nostrils daily. 07/23/23  Yes McDonough, Salomon Fick, PA-C  levothyroxine (SYNTHROID) 25 MCG tablet TAKE 1 TAB DAILY BEFORE BREAKFAST ON EMPTY STOMACH, WAIT 30 MINUTES AFTER TAKING MEDICATION TO EAT OR TAKE OTHER MEDICATIONS. 02/09/21  Yes Theotis Burrow, NP  lisinopril-hydrochlorothiazide (ZESTORETIC) 20-12.5 MG tablet TAKE 1 TABLET BY MOUTH EVERY DAY 08/09/23  Yes Adrian Blackwater A, MD  metoprolol succinate (TOPROL-XL) 25 MG 24 hr tablet TAKE 1/2 TABLET BY MOUTH EVERY DAY AS INSTRUCTED 07/30/23  Yes Adrian Blackwater A, MD  metoprolol tartrate (LOPRESSOR) 50 MG tablet Take 0.5 tablets by mouth daily. 08/13/23  Yes [provider]  pantoprazole (PROTONIX) 20 MG tablet TAKE 1 TABLET BY MOUTH EVERY DAY 09/17/23  Yes McDonough, Nicholas K, PA-C  rosuvastatin (CRESTOR) 40 MG tablet TAKE 1 TABLET BY MOUTH EVERY DAY 03/02/23  Yes Laurier Nancy, MD  spironolactone (ALDACTONE) 25 MG tablet Take 25 mg by mouth daily. 11/20/22  Yes [provider]  tadalafil (CIALIS) 20 MG tablet Take 1 tablet (20 mg total) by mouth daily as needed for erectile dysfunction. 08/04/22  Yes McGowan, Carollee Herter A, PA-C  clopidogrel (PLAVIX) 75 MG tablet TAKE 1 TABLET BY MOUTH EVERY DAY 04/16/23   Laurier Nancy, MD  testosterone (ANDROGEL) 50 MG/5GM  (1%) GEL Place 5 g onto the skin daily. 05/29/23   Michiel Cowboy A, PA-C    Allergies as of 09/10/2023   (No Known Allergies)    Family History  Problem Relation Age of Onset   Diabetes Mother    Heart attack Father    CAD Brother     Social History   Socioeconomic History   Marital status: Married    Spouse name: Not on file   Number of children: Not on file   Years of education: Not on file   Highest education level: Not on file  Occupational History   Not on file  Tobacco Use   Smoking status: Never    Passive exposure: Never   Smokeless tobacco: Never  Vaping Use   Vaping status: Never Used  Substance and Sexual Activity   Alcohol use: No   Drug use: No   Sexual activity: Yes  Other Topics Concern   Not on file  Social History Narrative   Not on file   Social Determinants of Health   Financial Resource Strain: Not on file  Food Insecurity: Not on file  Transportation Needs: Not on file  Physical Activity: Not on file  Stress: Not on file  Social Connections: Not on file  Intimate Partner Violence: Not on file    Review of Systems: See HPI, otherwise negative ROS  Physical Exam: BP (!) 131/91   Pulse (!) 58   Temp (!) 97.4 F (36.3 C) (Temporal)   Wt 83 kg   SpO2 100%   BMI 29.54 kg/m  General:   Alert,  pleasant and cooperative in NAD Head:  Normocephalic and atraumatic. Neck:  Supple; no masses or thyromegaly. Lungs:  Clear throughout to auscultation.    Heart:  Regular rate and rhythm. Abdomen:  Soft, nontender and nondistended. Normal bowel sounds, without guarding, and without rebound.   Neurologic:  Alert and  oriented x4;  grossly normal neurologically.  Impression/Plan: Nicholas Torres is here for an colonoscopy to be performed for colon cancer screening  Risks, benefits, limitations, and alternatives regarding  colonoscopy have been reviewed with the patient.  Questions have been answered.  All parties agreeable.   Nicholas Donath, MD  10/08/2023, 11:14 AM

## 2023-10-08 NOTE — Op Note (Signed)
[facility] Gastroenterology Patient Name: Nicholas Torres Procedure Date: 10/08/2023 11:44 AM MRN: [id number] Account #: [id number] Date of Birth: [birth year] Admit Type: Outpatient Age: 62 Room: [facility] ENDO ROOM 4 Gender: Male Note Status: Finalized Instrument Name: Peds Colonoscope [id number] Procedure:             Colonoscopy Indications:           Screening for colorectal malignant neoplasm Providers:             Toney Reil MD, MD Referring MD:          Carlean Jews (Referring MD) Medicines:             General Anesthesia Complications:         No immediate complications. Estimated blood loss: None. Procedure:             Pre-Anesthesia Assessment:                        - Prior to the procedure, a History and Physical was                         performed, and patient medications and allergies were                         reviewed. The patient is competent. The risks and                         benefits of the procedure and the sedation options and                         risks were discussed with the patient. All questions                         were answered and informed consent was obtained.                         Patient identification and proposed procedure were                         verified by the physician, the nurse, the                         anesthesiologist, the anesthetist and the technician                         in the pre-procedure area in the procedure room in the                         endoscopy suite. Mental Status Examination: alert and                         oriented. Airway Examination: normal oropharyngeal                         airway and neck mobility. Respiratory Examination:                         clear to auscultation. CV Examination: normal.  Prophylactic Antibiotics: The patient does not require                         prophylactic antibiotics. Prior Anticoagulants: The                          patient has taken Plavix (clopidogrel), last dose was                         5 days prior to procedure. ASA Grade Assessment: III -                         A patient with severe systemic disease. After                         reviewing the risks and benefits, the patient was                         deemed in satisfactory condition to undergo the                         procedure. The anesthesia plan was to use general                         anesthesia. Immediately prior to administration of                         medications, the patient was re-assessed for adequacy                         to receive sedatives. The heart rate, respiratory                         rate, oxygen saturations, blood pressure, adequacy of                         pulmonary ventilation, and response to care were                         monitored throughout the procedure. The physical                         status of the patient was re-assessed after the                         procedure.                        After obtaining informed consent, the colonoscope was                         passed under direct vision. Throughout the procedure,                         the patient's blood pressure, pulse, and oxygen                         saturations were monitored continuously. The  Colonoscope was introduced through the anus and                         advanced to the the cecum, identified by appendiceal                         orifice and ileocecal valve. The colonoscopy was                         performed without difficulty. The patient tolerated                         the procedure well. The quality of the bowel                         preparation was evaluated using the BBPS [facility] Bowel                         Preparation Scale) with scores of: Right Colon = 3,                         Transverse Colon = 3 and Left Colon = 3 (entire mucosa                         seen well with  no residual staining, small fragments                         of stool or opaque liquid). The total BBPS score                         equals 9. The ileocecal valve, appendiceal orifice,                         and rectum were photographed. Findings:      The perianal and digital rectal examinations were normal. Pertinent       negatives include normal sphincter tone and no palpable rectal lesions.      The entire examined colon appeared normal.      The retroflexed view of the distal rectum and anal verge was normal and       showed no anal or rectal abnormalities. Impression:            - The entire examined colon is normal.                        - The distal rectum and anal verge are normal on                         retroflexion view.                        - No specimens collected. Recommendation:        - Discharge patient to home (with escort).                        - Resume previous diet today.                        -  Continue present medications.                        - Repeat colonoscopy in 10 years for screening                         purposes.                        - Resume Plavix (clopidogrel) today at prior dose.                         Refer to managing physician for further adjustment of                         therapy. Procedure Code(s):     --- Professional ---                        Z6109, Colorectal cancer screening; colonoscopy on                         individual not meeting criteria for high risk Diagnosis Code(s):     --- Professional ---                        Z12.11, Encounter for screening for malignant neoplasm                         of colon CPT copyright 2022 American Medical Association. All rights reserved. The codes documented in this report are preliminary and upon coder review may  be revised to meet current compliance requirements. Dr. Libby Maw Toney Reil MD, MD 10/08/2023 12:06:45 PM This report has been signed  electronically. Number of Addenda: 0 Note Initiated On: 10/08/2023 11:44 AM Scope Withdrawal Time: 0 hours 9 minutes 4 seconds  Total Procedure Duration: 0 hours 11 minutes 21 seconds  Estimated Blood Loss:  Estimated blood loss: none.      [facility]

## 2023-10-08 NOTE — Anesthesia Preprocedure Evaluation (Signed)
Anesthesia Evaluation  Patient identified by MRN, date of birth, ID band Patient awake    Reviewed: Allergy & Precautions, H&P , NPO status , Patient's Chart, lab work & pertinent test results  Airway Mallampati: II  TM Distance: >3 FB Neck ROM: full    Dental  (+) Teeth Intact, Dental Advidsory Given   Pulmonary neg pulmonary ROS, neg COPD   breath sounds clear to auscultation       Cardiovascular hypertension, + CAD, + Past MI, + CABG (2017) and +CHF   Rhythm:regular Rate:Normal  Grade 1 diastolic dysfunction on most recent echo in 2017 R carotid endarterectomy March 2021   Neuro/Psych negative neurological ROS  negative psych ROS   GI/Hepatic negative GI ROS, Neg liver ROS,,,  Endo/Other  negative endocrine ROS    Renal/GU      Musculoskeletal   Abdominal   Peds  Hematology negative hematology ROS (+)   Anesthesia Other Findings Past Medical History: No date: Arthritis     Comment:  back 2017: CAD (coronary artery disease) No date: CHF (congestive heart failure) (HCC) No date: High cholesterol No date: Hypertension 2017: Myocardial infarction (HCC) No date: Pollen allergy  Past Surgical History: 07/24/2016: CARDIAC CATHETERIZATION; Right     Comment:  Procedure: Left Heart Cath and Coronary Angiography;                Surgeon: Laurier Nancy, MD;  Location: [facility] INVASIVE CV               LAB;  Service: Cardiovascular;  Laterality: Right; 07/26/2016: CORONARY ARTERY BYPASS GRAFT; N/A     Comment:  Procedure: CORONARY ARTERY BYPASS GRAFTING (CABG), ON               PUMP, TIMES FOUR, USING BILATERAL INTERNAL MAMMARY               ARTERIES, RIGHT GREATER SAPHENOUS VEIN HARVESTED               ENDOSCOPICALLY;  Surgeon: Loreli Slot, MD;                Location: [facility] OR;  Service: Open Heart Surgery;                Laterality: N/A; 01/22/2020: ENDARTERECTOMY; Right     Comment:  Procedure: ENDARTERECTOMY  CAROTID;  Surgeon: Annice Needy, MD;  Location: [facility] ORS;  Service: Vascular;                Laterality: Right; 07/26/2016: RADIAL ARTERY HARVEST; Left     Comment:  Procedure: RADIAL LEFT ARTERY HARVEST;  Surgeon: Loreli Slot, MD;  Location: [facility] OR;  Service: Open Heart              Surgery;  Laterality: Left; 07/26/2016: TEE WITHOUT CARDIOVERSION; N/A     Comment:  Procedure: TRANSESOPHAGEAL ECHOCARDIOGRAM (TEE);                Surgeon: Loreli Slot, MD;  Location: [facility] OR;                Service: Open Heart Surgery;  Laterality: N/A;  BMI    Body Mass Index: 29.62 kg/m      Reproductive/Obstetrics negative OB ROS  Anesthesia Physical Anesthesia Plan  ASA: 3  Anesthesia Plan: General   Post-op Pain Management: Minimal or no pain anticipated   Induction: Intravenous  PONV Risk Score and Plan: 3 and Propofol infusion, TIVA and Ondansetron  Airway Management Planned: Nasal Cannula  Additional Equipment: None  Intra-op Plan:   Post-operative Plan:   Informed Consent: I have reviewed the patients History and Physical, chart, labs and discussed the procedure including the risks, benefits and alternatives for the proposed anesthesia with the patient or authorized representative who has indicated his/her understanding and acceptance.     Dental advisory given  Plan Discussed with: CRNA and Surgeon  Anesthesia Plan Comments: (Discussed risks of anesthesia with patient, including possibility of difficulty with spontaneous ventilation under anesthesia necessitating airway intervention, PONV, and rare risks such as cardiac or respiratory or neurological events, and allergic reactions. Discussed the role of CRNA in patient's perioperative care. Patient understands.)       Anesthesia Quick Evaluation

## 2023-10-09 ENCOUNTER — Encounter: Payer: Self-pay | Admitting: Gastroenterology

## 2023-10-09 NOTE — Anesthesia Postprocedure Evaluation (Signed)
Anesthesia Post Note  Patient: Nicholas Torres  Procedure(s) Performed: COLONOSCOPY WITH PROPOFOL  Patient location during evaluation: Endoscopy Anesthesia Type: General Level of consciousness: awake and alert Pain management: pain level controlled Vital Signs Assessment: post-procedure vital signs reviewed and stable Respiratory status: spontaneous breathing, nonlabored ventilation, respiratory function stable and patient connected to nasal cannula oxygen Cardiovascular status: blood pressure returned to baseline and stable Postop Assessment: no apparent nausea or vomiting Anesthetic complications: no   There were no known notable events for this encounter.   Last Vitals:  Vitals:   10/08/23 1217 10/08/23 1227  BP: 99/73 116/84  Pulse: (!) 56 (!) 49  Resp: 14 15  Temp:  36.4 C  SpO2: 100% 100%    Last Pain:  Vitals:   10/09/23 0722  TempSrc:   PainSc: 0-No pain                 Stephanie Coup

## 2023-10-11 ENCOUNTER — Other Ambulatory Visit: Payer: Self-pay | Admitting: Cardiovascular Disease

## 2023-10-27 ENCOUNTER — Other Ambulatory Visit: Payer: Self-pay | Admitting: Cardiovascular Disease

## 2023-10-27 DIAGNOSIS — I2510 Atherosclerotic heart disease of native coronary artery without angina pectoris: Secondary | ICD-10-CM

## 2023-11-13 ENCOUNTER — Other Ambulatory Visit: Payer: Self-pay | Admitting: Cardiovascular Disease

## 2023-11-13 DIAGNOSIS — I25118 Atherosclerotic heart disease of native coronary artery with other forms of angina pectoris: Secondary | ICD-10-CM

## 2023-11-27 ENCOUNTER — Ambulatory Visit (INDEPENDENT_AMBULATORY_CARE_PROVIDER_SITE_OTHER): Payer: BC Managed Care – PPO | Admitting: Nurse Practitioner

## 2023-11-27 ENCOUNTER — Encounter: Payer: Self-pay | Admitting: Nurse Practitioner

## 2023-11-27 ENCOUNTER — Telehealth: Payer: Self-pay | Admitting: Physician Assistant

## 2023-11-27 VITALS — BP 120/80 | HR 63 | Temp 98.0°F | Resp 16 | Ht 66.0 in | Wt 189.0 lb

## 2023-11-27 DIAGNOSIS — I10 Essential (primary) hypertension: Secondary | ICD-10-CM

## 2023-11-27 DIAGNOSIS — M170 Bilateral primary osteoarthritis of knee: Secondary | ICD-10-CM

## 2023-11-27 DIAGNOSIS — I739 Peripheral vascular disease, unspecified: Secondary | ICD-10-CM | POA: Diagnosis not present

## 2023-11-27 DIAGNOSIS — M2141 Flat foot [pes planus] (acquired), right foot: Secondary | ICD-10-CM

## 2023-11-27 DIAGNOSIS — M2142 Flat foot [pes planus] (acquired), left foot: Secondary | ICD-10-CM

## 2023-11-27 MED ORDER — METHOCARBAMOL 500 MG PO TABS: 500.0000 mg | ORAL_TABLET | Freq: Four times a day (QID) | ORAL | 3 refills | Status: AC

## 2023-11-27 NOTE — Telephone Encounter (Signed)
 Cpap supply order faxed to [facility]; [phone]

## 2023-11-27 NOTE — Progress Notes (Signed)
 [facility] [address] [place], [place] [address]  Internal MEDICINE  Office Visit Note  Patient Name: Nicholas Torres  [id number]  [id number]  Date of Service: 11/27/2023  Chief Complaint  Patient presents with   Acute Visit    Left leg pain, bottom of feet hurting as well (both)     HPI Tomislav presents for an acute sick visit for left leg pain, cold feeling of leg leg, knee pain bilaterally and flat footed.  --onset of cold feeling of the left leg was a couple of months ago.  --increased pain with ambulation, but not at rest in the left leg. Concern for PVD or PAD --Sees Dr. Marea for vascular surgery due to having a right endarterectomy a few years ago.  --reports that he is flat-footed. Has chronic foot pain -- has not seen a podiatrist  Send Dr. Marea a message to have office call him for appt.       Current Medication:  Outpatient Encounter Medications as of 11/27/2023  Medication Sig   AMBULATORY NON FORMULARY MEDICATION Trimix (30/1/10)-(Pap/Phent/PGE)  Test Dose  1ml vial   Qty #3 Refills 0  [facility] [phone] Fax [phone]   ASPIRIN  LOW DOSE 81 MG tablet TAKE 1 TABLET BY MOUTH EVERY DAY   clopidogrel  (PLAVIX ) 75 MG tablet TAKE 1 TABLET BY MOUTH EVERY DAY   cyclobenzaprine  (FLEXERIL ) 10 MG tablet TAKE 1 TABLET BY MOUTH AT BEDTIME FOR NECK SPASMS   dapagliflozin  propanediol (FARXIGA ) 10 MG TABS tablet Take 1 tablet (10 mg total) by mouth daily before breakfast.   fluticasone  (FLONASE ) 50 MCG/ACT nasal spray Place 1 spray into both nostrils daily.   levothyroxine  (SYNTHROID ) 25 MCG tablet TAKE 1 TAB DAILY BEFORE BREAKFAST ON EMPTY STOMACH, WAIT 30 MINUTES AFTER TAKING MEDICATION TO EAT OR TAKE OTHER MEDICATIONS.   lisinopril -hydrochlorothiazide  (ZESTORETIC ) 20-12.5 MG tablet TAKE 1 TABLET BY MOUTH EVERY DAY   methocarbamol  (ROBAXIN ) 500 MG tablet Take 1-2 tablets (500-1,000 mg total) by mouth 4 (four) times daily.   metoprolol   succinate (TOPROL -XL) 25 MG 24 hr tablet TAKE 1/2 TABLET BY MOUTH EVERY DAY AS INSTRUCTED   pantoprazole  (PROTONIX ) 20 MG tablet TAKE 1 TABLET BY MOUTH EVERY DAY   rosuvastatin  (CRESTOR ) 40 MG tablet TAKE 1 TABLET BY MOUTH EVERY DAY   spironolactone (ALDACTONE) 25 MG tablet Take 25 mg by mouth daily.   tadalafil  (CIALIS ) 20 MG tablet Take 1 tablet (20 mg total) by mouth daily as needed for erectile dysfunction.   testosterone  (ANDROGEL ) 50 MG/5GM (1%) GEL Place 5 g onto the skin daily.   No facility-administered encounter medications on file as of 11/27/2023.      Medical History: Past Medical History:  Diagnosis Date   Arthritis    back   CAD (coronary artery disease) 2017   CHF (congestive heart failure) (HCC)    High cholesterol    Hypertension    Myocardial infarction (HCC) 2017   Pollen allergy      Vital Signs: BP 120/80   Pulse 63   Temp 98 F (36.7 C)   Resp 16   Ht 5' 6 (1.676 m)   Wt 189 lb (85.7 kg)   SpO2 98%   BMI 30.51 kg/m    Review of Systems  Constitutional:  Positive for activity change (due to left leg pain).  Respiratory:  Negative for cough, chest tightness, shortness of breath and wheezing.   Cardiovascular: Negative.  Negative for chest pain and palpitations.  Musculoskeletal:  Positive for arthralgias (bilateral knees and bilateral foot pain), joint swelling and myalgias (left leg).    Physical Exam Vitals reviewed.  Constitutional:      General: He is not in acute distress.    Appearance: Normal appearance. He is obese. He is not ill-appearing.  HENT:     Head: Normocephalic and atraumatic.  Eyes:     Pupils: Pupils are equal, round, and reactive to light.  Cardiovascular:     Rate and Rhythm: Normal rate and regular rhythm.  Musculoskeletal:     Right knee: Bony tenderness (medial bony prominence) present. Decreased range of motion.     Left knee: Bony tenderness (medial bony prominence) present. Decreased range of motion.      Right foot: Deformity (flat foot) present.     Left foot: Deformity (flat foot) present.     Comments: Negative homan's sign of left leg  Neurological:     Mental Status: He is alert and oriented to person, place, and time.  Psychiatric:        Mood and Affect: Mood normal.        Behavior: Behavior normal.      Assessment/Plan: 1. Claudication, intermittent (HCC) (Primary) Message sent to Dr. Marea who the patient is already established with for vascular surgery so that he can be evaluated further for PVD or PAD.   2. Pes planus of both feet Referred to podiatry.  - Ambulatory referral to Podiatry  3. Bilateral primary osteoarthritis of knee Recommended tylenol  arthritis twice daily for 7-10 days and then as needed as well as taking methocarbamol  as needed.  - methocarbamol  (ROBAXIN ) 500 MG tablet; Take 1-2 tablets (500-1,000 mg total) by mouth 4 (four) times daily.  Dispense: 120 tablet; Refill: 3  4. Essential hypertension Stable, continue medications as prescribed    General Counseling: Berlie verbalizes understanding of the findings of todays visit and agrees with plan of treatment. I have discussed any further diagnostic evaluation that may be needed or ordered today. We also reviewed his medications today. he has been encouraged to call the office with any questions or concerns that should arise related to todays visit.    Counseling:    Orders Placed This Encounter  Procedures   Ambulatory referral to Podiatry    Meds ordered this encounter  Medications   methocarbamol  (ROBAXIN ) 500 MG tablet    Sig: Take 1-2 tablets (500-1,000 mg total) by mouth 4 (four) times daily.    Dispense:  120 tablet    Refill:  3    Fill new script today    Return if symptoms worsen or fail to improve.  [place] Controlled Substance Database was reviewed by me for overdose risk score (ORS)  Time spent:30 Minutes Time spent with patient included reviewing progress notes, labs, imaging  studies, and discussing plan for follow up.   This patient was seen by Mardy Maxin, FNP-C in collaboration with Dr. Sigrid Bathe as a part of collaborative care agreement.  Makeda Peeks R. Maxin, MSN, FNP-C Internal Medicine

## 2023-11-28 ENCOUNTER — Other Ambulatory Visit (INDEPENDENT_AMBULATORY_CARE_PROVIDER_SITE_OTHER): Payer: Self-pay | Admitting: Vascular Surgery

## 2023-11-28 DIAGNOSIS — I739 Peripheral vascular disease, unspecified: Secondary | ICD-10-CM

## 2023-11-29 ENCOUNTER — Encounter (INDEPENDENT_AMBULATORY_CARE_PROVIDER_SITE_OTHER): Payer: Self-pay | Admitting: Nurse Practitioner

## 2023-11-29 ENCOUNTER — Ambulatory Visit (INDEPENDENT_AMBULATORY_CARE_PROVIDER_SITE_OTHER): Payer: BC Managed Care – PPO | Admitting: Nurse Practitioner

## 2023-11-29 ENCOUNTER — Ambulatory Visit (INDEPENDENT_AMBULATORY_CARE_PROVIDER_SITE_OTHER): Payer: BC Managed Care – PPO

## 2023-11-29 VITALS — BP 133/87 | HR 59 | Resp 16 | Ht 66.0 in | Wt 186.0 lb

## 2023-11-29 DIAGNOSIS — E782 Mixed hyperlipidemia: Secondary | ICD-10-CM

## 2023-11-29 DIAGNOSIS — I6523 Occlusion and stenosis of bilateral carotid arteries: Secondary | ICD-10-CM | POA: Diagnosis not present

## 2023-11-29 DIAGNOSIS — I10 Essential (primary) hypertension: Secondary | ICD-10-CM

## 2023-11-29 DIAGNOSIS — I739 Peripheral vascular disease, unspecified: Secondary | ICD-10-CM | POA: Diagnosis not present

## 2023-11-29 NOTE — Progress Notes (Signed)
Subjective:    Patient ID: [name], male    DOB: 08/05/1961, 63 y.o.   MRN: [id number] Chief Complaint  Patient presents with   Follow-up    ABI + consult.    Gwynn Bernstein is a 63 year old male who presents today for evaluation of claudication-like symptoms.  He notes that there is pain in his left calf when he walks.  He notes that he also has coldness feeling of his left foot when he walks as well.  In evaluation the foot itself is not cold to the touch.  He notes this has been ongoing for the last couple of months.  He does have a history of a carotid endarterectomy and most recent studies show minimal stenosis and a patent carotid endarterectomy.  He does have a history of having spinal injury on the job sometime ago.  He notes that he is seeing a Pensions consultant.  He also notes that he has pain in his back and sometimes shooting pain with activity and movement.  Today he has no description of rest pain or ulcerations.  Today his ABI is 1.17 on the right and 1.21 on the left.  He has triphasic tibial artery waveforms bilaterally.  He has a TBI of 1.18 on the right and 1.20 on the left with normal toe waveforms bilaterally.    Review of Systems  Musculoskeletal:  Positive for arthralgias and back pain.  All other systems reviewed and are negative.      Objective:   Physical Exam Vitals reviewed.  HENT:     Head: Normocephalic.  Cardiovascular:     Rate and Rhythm: Normal rate.     Pulses:          Dorsalis pedis pulses are detected w/ Doppler on the right side and detected w/ Doppler on the left side.       Posterior tibial pulses are detected w/ Doppler on the right side and detected w/ Doppler on the left side.  Pulmonary:     Effort: Pulmonary effort is normal.  Skin:    General: Skin is warm and dry.  Neurological:     Mental Status: He is alert and oriented to person, place, and time.  Psychiatric:        Mood and Affect: Mood normal.        Behavior: Behavior  normal.        Thought Content: Thought content normal.        Judgment: Judgment normal.     BP 133/87   Pulse (!) 59   Resp 16   Ht 5\' 6"  (1.676 m)   Wt 186 lb (84.4 kg)   BMI 30.02 kg/m   Past Medical History:  Diagnosis Date   Arthritis    back   CAD (coronary artery disease) 2017   CHF (congestive heart failure) (HCC)    High cholesterol    Hypertension    Myocardial infarction (HCC) 2017   Pollen allergy     Social History   Socioeconomic History   Marital status: Married    Spouse name: Not on file   Number of children: Not on file   Years of education: Not on file   Highest education level: Not on file  Occupational History   Not on file  Tobacco Use   Smoking status: Never    Passive exposure: Never   Smokeless tobacco: Never  Vaping Use   Vaping status: Never Used  Substance and Sexual Activity  Alcohol use: No   Drug use: No   Sexual activity: Yes  Other Topics Concern   Not on file  Social History Narrative   Not on file   Social Drivers of Health   Financial Resource Strain: Not on file  Food Insecurity: Not on file  Transportation Needs: Not on file  Physical Activity: Not on file  Stress: Not on file  Social Connections: Not on file  Intimate Partner Violence: Not on file    Past Surgical History:  Procedure Laterality Date   CARDIAC CATHETERIZATION Right 07/24/2016   Procedure: Left Heart Cath and Coronary Angiography;  Surgeon: Laurier Nancy, MD;  Location: [facility] INVASIVE CV LAB;  Service: Cardiovascular;  Laterality: Right;   COLONOSCOPY WITH PROPOFOL N/A 10/08/2023   Procedure: COLONOSCOPY WITH PROPOFOL;  Surgeon: Toney Reil, MD;  Location: [facility] ENDOSCOPY;  Service: Gastroenterology;  Laterality: N/A;   CORONARY ARTERY BYPASS GRAFT N/A 07/26/2016   Procedure: CORONARY ARTERY BYPASS GRAFTING (CABG), ON PUMP, TIMES FOUR, USING BILATERAL INTERNAL MAMMARY ARTERIES, RIGHT GREATER SAPHENOUS VEIN HARVESTED ENDOSCOPICALLY;   Surgeon: Loreli Slot, MD;  Location: [facility] OR;  Service: Open Heart Surgery;  Laterality: N/A;   ENDARTERECTOMY Right 01/22/2020   Procedure: ENDARTERECTOMY CAROTID;  Surgeon: Annice Needy, MD;  Location: [facility] ORS;  Service: Vascular;  Laterality: Right;   INGUINAL HERNIA REPAIR Right 04/16/2020   Procedure: HERNIA REPAIR INGUINAL ADULT;  Surgeon: Duanne Guess, MD;  Location: [facility] ORS;  Service: General;  Laterality: Right;   RADIAL ARTERY HARVEST Left 07/26/2016   Procedure: RADIAL LEFT ARTERY HARVEST;  Surgeon: Loreli Slot, MD;  Location: [facility] OR;  Service: Open Heart Surgery;  Laterality: Left;   TEE WITHOUT CARDIOVERSION N/A 07/26/2016   Procedure: TRANSESOPHAGEAL ECHOCARDIOGRAM (TEE);  Surgeon: Loreli Slot, MD;  Location: [facility] OR;  Service: Open Heart Surgery;  Laterality: N/A;    Family History  Problem Relation Age of Onset   Diabetes Mother    Heart attack Father    CAD Brother     No Known Allergies     Latest Ref Rng & Units 05/29/2022    8:58 AM 11/11/2020    8:08 AM 04/16/2020    8:09 AM  CBC  WBC 3.8 - 10.8 Thousand/uL  4.8    Hemoglobin 13.0 - 17.7 g/dL 16.1  09.6  04.5   Hematocrit 37.5 - 51.0 % 42.0  42.2  43.0   Platelets 140 - 400 Thousand/uL  130        CMP     Component Value Date/Time   NA 141 11/11/2020 0808   K 3.2 (L) 11/11/2020 0808   CL 104 11/11/2020 0808   CO2 31 11/11/2020 0808   GLUCOSE 86 11/11/2020 0808   BUN 18 11/11/2020 0808   CREATININE 1.24 11/11/2020 0808   CALCIUM 9.3 11/11/2020 0808   PROT 7.2 11/11/2020 0808   ALBUMIN 3.7 08/11/2016 1142   AST 23 11/11/2020 0808   ALT 17 11/11/2020 0808   ALKPHOS 78 08/11/2016 1142   BILITOT 0.3 11/11/2020 0808   GFRNONAA 64 11/11/2020 0808     No results found.     Assessment & Plan:   1. Claudication (HCC) (Primary) Recommend:  The patient has atypical pain symptoms for vascular disease and on exam I do not find evidence of vascular pathology that would explain  the patient's symptoms.  Noninvasive studies do not identify significant vascular problems, ABI and TBI are WNL today  I suspect the patient  is c/o pseudoclaudication.  Patient should have an evaluation of the LS spine.  The patient should continue walking and begin a more formal exercise program. The patient should continue his antiplatelet therapy and aggressive treatment of the lipid abnormalities.  Patient will follow-up with me on a PRN basis.  2. Bilateral carotid artery stenosis The patient had repeat noninvasive studies several months ago and these studies were stable.  Will continue with 1 year follow-up.  3. Essential hypertension Continue antihypertensive medications as already ordered, these medications have been reviewed and there are no changes at this time.  4. Mixed hyperlipidemia Continue statin as ordered and reviewed, no changes at this time   Current Outpatient Medications on File Prior to Visit  Medication Sig Dispense Refill   AMBULATORY NON FORMULARY MEDICATION Trimix (30/1/10)-(Pap/Phent/PGE)  Test Dose  1ml vial   Qty #3 Refills 0  [facility] [phone] Fax [phone] 3 mL 0   ASPIRIN LOW DOSE 81 MG tablet TAKE 1 TABLET BY MOUTH EVERY DAY 90 tablet 3   clopidogrel (PLAVIX) 75 MG tablet TAKE 1 TABLET BY MOUTH EVERY DAY 90 tablet 1   cyclobenzaprine (FLEXERIL) 10 MG tablet TAKE 1 TABLET BY MOUTH AT BEDTIME FOR NECK SPASMS 30 tablet 2   dapagliflozin propanediol (FARXIGA) 10 MG TABS tablet Take 1 tablet (10 mg total) by mouth daily before breakfast. 30 tablet 3   fluticasone (FLONASE) 50 MCG/ACT nasal spray Place 1 spray into both nostrils daily. 48 mL 1   levothyroxine (SYNTHROID) 25 MCG tablet TAKE 1 TAB DAILY BEFORE BREAKFAST ON EMPTY STOMACH, WAIT 30 MINUTES AFTER TAKING MEDICATION TO EAT OR TAKE OTHER MEDICATIONS. 90 tablet 0   lisinopril-hydrochlorothiazide (ZESTORETIC) 20-12.5 MG tablet TAKE 1 TABLET BY MOUTH EVERY DAY 90 tablet 1    methocarbamol (ROBAXIN) 500 MG tablet Take 1-2 tablets (500-1,000 mg total) by mouth 4 (four) times daily. 120 tablet 3   metoprolol succinate (TOPROL-XL) 25 MG 24 hr tablet TAKE 1/2 TABLET BY MOUTH EVERY DAY AS INSTRUCTED 45 tablet 0   pantoprazole (PROTONIX) 20 MG tablet TAKE 1 TABLET BY MOUTH EVERY DAY 90 tablet 1   rosuvastatin (CRESTOR) 40 MG tablet TAKE 1 TABLET BY MOUTH EVERY DAY 90 tablet 1   spironolactone (ALDACTONE) 25 MG tablet Take 25 mg by mouth daily.     tadalafil (CIALIS) 20 MG tablet Take 1 tablet (20 mg total) by mouth daily as needed for erectile dysfunction. 30 tablet 3   testosterone (ANDROGEL) 50 MG/5GM (1%) GEL Place 5 g onto the skin daily. 150 g 3   No current facility-administered medications on file prior to visit.    There are no Patient Instructions on file for this visit. No follow-ups on file.   Georgiana Spinner, NP

## 2023-12-25 ENCOUNTER — Ambulatory Visit: Payer: BC Managed Care – PPO | Admitting: Podiatry

## 2023-12-25 ENCOUNTER — Ambulatory Visit (INDEPENDENT_AMBULATORY_CARE_PROVIDER_SITE_OTHER): Payer: BC Managed Care – PPO

## 2023-12-25 DIAGNOSIS — M19079 Primary osteoarthritis, unspecified ankle and foot: Secondary | ICD-10-CM

## 2023-12-25 DIAGNOSIS — M2141 Flat foot [pes planus] (acquired), right foot: Secondary | ICD-10-CM

## 2023-12-25 DIAGNOSIS — M2142 Flat foot [pes planus] (acquired), left foot: Secondary | ICD-10-CM | POA: Diagnosis not present

## 2023-12-25 NOTE — Progress Notes (Signed)
No chief complaint on file.   Subjective:  63 y.o. male presenting today as a new patient for evaluation of chronic pain and tenderness associated to the bilateral feet.  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  Most recently the patient has had increased pain and tenderness especially to the right foot.  Pain extends into the arch of the foot.  No history of injury.  Patient is retired and not strenuously on his feet.   Past Medical History:  Diagnosis Date   Arthritis    back   CAD (coronary artery disease) 2017   CHF (congestive heart failure) (HCC)    High cholesterol    Hypertension    Myocardial infarction [facility]) 2017   Pollen allergy    Past Surgical History:  Procedure Laterality Date   CARDIAC CATHETERIZATION Right 07/24/2016   Procedure: Left Heart Cath and Coronary Angiography;  Surgeon: Laurier Nancy, MD;  Location: [facility] INVASIVE CV LAB;  Service: Cardiovascular;  Laterality: Right;   COLONOSCOPY WITH PROPOFOL N/A 10/08/2023   Procedure: COLONOSCOPY WITH PROPOFOL;  Surgeon: Toney Reil, MD;  Location: [facility] ENDOSCOPY;  Service: Gastroenterology;  Laterality: N/A;   CORONARY ARTERY BYPASS GRAFT N/A 07/26/2016   Procedure: CORONARY ARTERY BYPASS GRAFTING (CABG), ON PUMP, TIMES FOUR, USING BILATERAL INTERNAL MAMMARY ARTERIES, RIGHT GREATER SAPHENOUS VEIN HARVESTED ENDOSCOPICALLY;  Surgeon: Loreli Slot, MD;  Location: [facility] OR;  Service: Open Heart Surgery;  Laterality: N/A;   ENDARTERECTOMY Right 01/22/2020   Procedure: ENDARTERECTOMY CAROTID;  Surgeon: Annice Needy, MD;  Location: [facility] ORS;  Service: Vascular;  Laterality: Right;   INGUINAL HERNIA REPAIR Right 04/16/2020   Procedure: HERNIA REPAIR INGUINAL ADULT;  Surgeon: Duanne Guess, MD;  Location: [facility] ORS;  Service: General;  Laterality: Right;   RADIAL ARTERY HARVEST Left 07/26/2016   Procedure: RADIAL LEFT ARTERY HARVEST;  Surgeon: Loreli Slot, MD;   Location: [facility] OR;  Service: Open Heart Surgery;  Laterality: Left;   TEE WITHOUT CARDIOVERSION N/A 07/26/2016   Procedure: TRANSESOPHAGEAL ECHOCARDIOGRAM (TEE);  Surgeon: Loreli Slot, MD;  Location: [facility] OR;  Service: Open Heart Surgery;  Laterality: N/A;   No Known Allergies     Objective/Physical Exam General: The patient is alert and oriented x3 in no acute distress.  Dermatology: Skin is warm, dry and supple bilateral lower extremities. Negative for open lesions or macerations.  Vascular: Palpable pedal pulses bilaterally. No edema or erythema noted. Capillary refill within normal limits.  Neurological: Grossly intact via light touch  Musculoskeletal Exam: Range of motion within normal limits to all pedal and ankle joints bilateral. Muscle strength 5/5 in all groups bilateral. Upon weightbearing there is a medial longitudinal arch collapse bilaterally. Remove foot valgus noted to the bilateral lower extremities with excessive pronation upon mid stance.  Radiographic Exam B/L feet 12/25/2023:  Normal osseous mineralization. Joint spaces preserved. No fracture/dislocation/boney destruction.  Pes planus noted on radiographic exam lateral views. Decreased calcaneal inclination and metatarsal declination angle is noted. Anterior break in the cyma line noted on lateral views. Medial talar head to deviation noted on AP radiograph.   Assessment: 1. pes planus bilateral   Plan of Care:  -Patient evaluated.  X-rays reviewed -Recommended to the patient that the best long-term solution would be custom molded orthotics to support the medial longitudinal arch of the foot.  This should help alleviate a lot of his pain associated to the flatfoot deformity -Order was placed  in the patient's chart and provided for the patient to take to the [place] for authorization and approval -Advise against going barefoot.  Recommend good supportive shoes and sneakers -Return to clinic as needed   Felecia Shelling, DPM [facility]  Dr. Felecia Shelling, DPM    [address]                                        [place], [place] [address]                Office [phone]  Fax [phone]

## 2024-01-14 ENCOUNTER — Other Ambulatory Visit: Payer: Self-pay | Admitting: Physician Assistant

## 2024-01-14 DIAGNOSIS — T7840XD Allergy, unspecified, subsequent encounter: Secondary | ICD-10-CM

## 2024-01-21 ENCOUNTER — Encounter: Payer: BC Managed Care – PPO | Admitting: Physician Assistant

## 2024-01-22 ENCOUNTER — Ambulatory Visit: Payer: BC Managed Care – PPO | Admitting: Cardiovascular Disease

## 2024-01-28 ENCOUNTER — Ambulatory Visit: Admitting: Cardiovascular Disease

## 2024-01-29 ENCOUNTER — Ambulatory Visit (INDEPENDENT_AMBULATORY_CARE_PROVIDER_SITE_OTHER): Admitting: Cardiovascular Disease

## 2024-01-29 ENCOUNTER — Encounter: Payer: Self-pay | Admitting: Cardiovascular Disease

## 2024-01-29 VITALS — BP 128/74 | HR 55 | Ht 66.0 in | Wt 198.0 lb

## 2024-01-29 DIAGNOSIS — E782 Mixed hyperlipidemia: Secondary | ICD-10-CM | POA: Diagnosis not present

## 2024-01-29 DIAGNOSIS — I214 Non-ST elevation (NSTEMI) myocardial infarction: Secondary | ICD-10-CM

## 2024-01-29 DIAGNOSIS — I5033 Acute on chronic diastolic (congestive) heart failure: Secondary | ICD-10-CM

## 2024-01-29 DIAGNOSIS — I10 Essential (primary) hypertension: Secondary | ICD-10-CM

## 2024-01-29 DIAGNOSIS — Z951 Presence of aortocoronary bypass graft: Secondary | ICD-10-CM

## 2024-01-29 DIAGNOSIS — R0602 Shortness of breath: Secondary | ICD-10-CM | POA: Diagnosis not present

## 2024-01-29 NOTE — Progress Notes (Signed)
 Cardiology Office Note   Date:  01/29/2024   ID:  MACALLAN ORD, DOB 26-Jul-1961, MRN [id number]  PCP:  Carlean Jews, PA-C  Cardiologist:  Adrian Blackwater, MD      History of Present Illness: Nicholas Torres is a 63 y.o. male who presents for  Chief Complaint  Patient presents with   Follow-up    4 month follow up     Back has been hurting him, but no chest pain.      Past Medical History:  Diagnosis Date   Arthritis    back   CAD (coronary artery disease) 2017   CHF (congestive heart failure) (HCC)    High cholesterol    Hypertension    Myocardial infarction [facility]) 2017   Pollen allergy      Past Surgical History:  Procedure Laterality Date   CARDIAC CATHETERIZATION Right 07/24/2016   Procedure: Left Heart Cath and Coronary Angiography;  Surgeon: Laurier Nancy, MD;  Location: [facility] INVASIVE CV LAB;  Service: Cardiovascular;  Laterality: Right;   COLONOSCOPY WITH PROPOFOL N/A 10/08/2023   Procedure: COLONOSCOPY WITH PROPOFOL;  Surgeon: Toney Reil, MD;  Location: [facility] ENDOSCOPY;  Service: Gastroenterology;  Laterality: N/A;   CORONARY ARTERY BYPASS GRAFT N/A 07/26/2016   Procedure: CORONARY ARTERY BYPASS GRAFTING (CABG), ON PUMP, TIMES FOUR, USING BILATERAL INTERNAL MAMMARY ARTERIES, RIGHT GREATER SAPHENOUS VEIN HARVESTED ENDOSCOPICALLY;  Surgeon: Loreli Slot, MD;  Location: [facility] OR;  Service: Open Heart Surgery;  Laterality: N/A;   ENDARTERECTOMY Right 01/22/2020   Procedure: ENDARTERECTOMY CAROTID;  Surgeon: Annice Needy, MD;  Location: [facility] ORS;  Service: Vascular;  Laterality: Right;   INGUINAL HERNIA REPAIR Right 04/16/2020   Procedure: HERNIA REPAIR INGUINAL ADULT;  Surgeon: Duanne Guess, MD;  Location: [facility] ORS;  Service: General;  Laterality: Right;   RADIAL ARTERY HARVEST Left 07/26/2016   Procedure: RADIAL LEFT ARTERY HARVEST;  Surgeon: Loreli Slot, MD;  Location: [facility] OR;  Service: Open Heart Surgery;  Laterality: Left;   TEE  WITHOUT CARDIOVERSION N/A 07/26/2016   Procedure: TRANSESOPHAGEAL ECHOCARDIOGRAM (TEE);  Surgeon: Loreli Slot, MD;  Location: [facility] OR;  Service: Open Heart Surgery;  Laterality: N/A;     Current Outpatient Medications  Medication Sig Dispense Refill   AMBULATORY NON FORMULARY MEDICATION Trimix (30/1/10)-(Pap/Phent/PGE)  Test Dose  1ml vial   Qty #3 Refills 0  [facility] [phone] Fax [phone] 3 mL 0   ASPIRIN LOW DOSE 81 MG tablet TAKE 1 TABLET BY MOUTH EVERY DAY 90 tablet 3   clopidogrel (PLAVIX) 75 MG tablet TAKE 1 TABLET BY MOUTH EVERY DAY 90 tablet 1   cyclobenzaprine (FLEXERIL) 10 MG tablet TAKE 1 TABLET BY MOUTH AT BEDTIME FOR NECK SPASMS 30 tablet 2   dapagliflozin propanediol (FARXIGA) 10 MG TABS tablet Take 1 tablet (10 mg total) by mouth daily before breakfast. 30 tablet 3   fluticasone (FLONASE) 50 MCG/ACT nasal spray SPRAY 1 SPRAY INTO BOTH NOSTRILS DAILY. 48 mL 1   levothyroxine (SYNTHROID) 25 MCG tablet TAKE 1 TAB DAILY BEFORE BREAKFAST ON EMPTY STOMACH, WAIT 30 MINUTES AFTER TAKING MEDICATION TO EAT OR TAKE OTHER MEDICATIONS. 90 tablet 0   lisinopril-hydrochlorothiazide (ZESTORETIC) 20-12.5 MG tablet TAKE 1 TABLET BY MOUTH EVERY DAY 90 tablet 1   methocarbamol (ROBAXIN) 500 MG tablet Take 1-2 tablets (500-1,000 mg total) by mouth 4 (four) times daily. 120 tablet 3   metoprolol succinate (TOPROL-XL) 25 MG 24 hr tablet TAKE 1/2 TABLET BY  MOUTH EVERY DAY AS INSTRUCTED 45 tablet 0   pantoprazole (PROTONIX) 20 MG tablet TAKE 1 TABLET BY MOUTH EVERY DAY 90 tablet 1   rosuvastatin (CRESTOR) 40 MG tablet TAKE 1 TABLET BY MOUTH EVERY DAY 90 tablet 1   spironolactone (ALDACTONE) 25 MG tablet Take 25 mg by mouth daily.     tadalafil (CIALIS) 20 MG tablet Take 1 tablet (20 mg total) by mouth daily as needed for erectile dysfunction. 30 tablet 3   testosterone (ANDROGEL) 50 MG/5GM (1%) GEL Place 5 g onto the skin daily. 150 g 3   No current  facility-administered medications for this visit.    Allergies:   Patient has no known allergies.    Social History:   reports that he has never smoked. He has never been exposed to tobacco smoke. He has never used smokeless tobacco. He reports that he does not drink alcohol and does not use drugs.   Family History:  family history includes CAD in his brother; Diabetes in his mother; Heart attack in his father.    ROS:     Review of Systems  Constitutional: Negative.   HENT: Negative.    Eyes: Negative.   Respiratory: Negative.    Gastrointestinal: Negative.   Genitourinary: Negative.   Musculoskeletal: Negative.   Skin: Negative.   Neurological: Negative.   Endo/Heme/Allergies: Negative.   Psychiatric/Behavioral: Negative.    All other systems reviewed and are negative.     All other systems are reviewed and negative.    PHYSICAL EXAM: VS:  BP 128/74   Pulse (!) 55   Ht 5\' 6"  (1.676 m)   Wt 198 lb (89.8 kg)   SpO2 98%   BMI 31.96 kg/m  , BMI Body mass index is 31.96 kg/m. Last weight:  Wt Readings from Last 3 Encounters:  01/29/24 198 lb (89.8 kg)  11/29/23 186 lb (84.4 kg)  11/27/23 189 lb (85.7 kg)     Physical Exam Vitals reviewed.  Constitutional:      Appearance: Normal appearance. He is normal weight.  HENT:     Head: Normocephalic.     Nose: Nose normal.     Mouth/Throat:     Mouth: Mucous membranes are moist.  Eyes:     Pupils: Pupils are equal, round, and reactive to light.  Cardiovascular:     Rate and Rhythm: Normal rate and regular rhythm.     Pulses: Normal pulses.     Heart sounds: Normal heart sounds.  Pulmonary:     Effort: Pulmonary effort is normal.  Abdominal:     General: Abdomen is flat. Bowel sounds are normal.  Musculoskeletal:        General: Normal range of motion.     Cervical back: Normal range of motion.  Skin:    General: Skin is warm.  Neurological:     General: No focal deficit present.     Mental Status: He is  alert.  Psychiatric:        Mood and Affect: Mood normal.       EKG:   Recent Labs: No results found for requested labs within last 365 days.    Lipid Panel    Component Value Date/Time   CHOL 111 11/11/2020 0808   TRIG 74 11/11/2020 0808   HDL 58 11/11/2020 0808   CHOLHDL 1.9 11/11/2020 0808   VLDL 17 07/24/2016 0612   LDLCALC 38 11/11/2020 0808      Other studies Reviewed: Additional studies/ records that  were reviewed today include:  Review of the above records demonstrates:       No data to display            ASSESSMENT AND PLAN:    ICD-10-CM   1. CHF (congestive heart failure), NYHA class III, acute on chronic, diastolic (HCC)  I50.33    Has SOB laying down so has occasional orthopnea. Marcelline Deist is helping.    2. Essential hypertension  I10     3. S/P CABG x 4  Z95.1     4. SOB (shortness of breath)  R06.02     5. Mixed hyperlipidemia  E78.2     6. NSTEMI (non-ST elevated myocardial infarction) (HCC)  I21.4        Problem List Items Addressed This Visit       Cardiovascular and Mediastinum   NSTEMI (non-ST elevated myocardial infarction) (HCC)   Essential hypertension     Other   S/P CABG x 4   Mixed hyperlipidemia   SOB (shortness of breath)   Other Visit Diagnoses       CHF (congestive heart failure), NYHA class III, acute on chronic, diastolic (HCC)    -  Primary   Has SOB laying down so has occasional orthopnea. Marcelline Deist is helping.          Disposition:   Return in about 3 months (around 04/30/2024).    Total time spent: 30 minutes  Signed,  Adrian Blackwater, MD  01/29/2024 3:14 PM    [facility]

## 2024-02-10 ENCOUNTER — Other Ambulatory Visit: Payer: Self-pay | Admitting: Cardiovascular Disease

## 2024-02-10 DIAGNOSIS — I25118 Atherosclerotic heart disease of native coronary artery with other forms of angina pectoris: Secondary | ICD-10-CM

## 2024-02-11 ENCOUNTER — Encounter: Admitting: Physician Assistant

## 2024-02-15 ENCOUNTER — Encounter: Admitting: Physician Assistant

## 2024-02-21 ENCOUNTER — Other Ambulatory Visit: Payer: Self-pay | Admitting: Cardiovascular Disease

## 2024-02-21 DIAGNOSIS — I2510 Atherosclerotic heart disease of native coronary artery without angina pectoris: Secondary | ICD-10-CM

## 2024-02-21 DIAGNOSIS — I6521 Occlusion and stenosis of right carotid artery: Secondary | ICD-10-CM

## 2024-02-21 DIAGNOSIS — R0602 Shortness of breath: Secondary | ICD-10-CM

## 2024-02-21 DIAGNOSIS — I10 Essential (primary) hypertension: Secondary | ICD-10-CM

## 2024-02-21 DIAGNOSIS — E782 Mixed hyperlipidemia: Secondary | ICD-10-CM

## 2024-02-21 DIAGNOSIS — I25118 Atherosclerotic heart disease of native coronary artery with other forms of angina pectoris: Secondary | ICD-10-CM

## 2024-02-21 DIAGNOSIS — Z951 Presence of aortocoronary bypass graft: Secondary | ICD-10-CM

## 2024-02-29 ENCOUNTER — Other Ambulatory Visit: Payer: BC Managed Care – PPO

## 2024-03-06 ENCOUNTER — Encounter: Payer: Self-pay | Admitting: Physician Assistant

## 2024-03-06 ENCOUNTER — Ambulatory Visit (INDEPENDENT_AMBULATORY_CARE_PROVIDER_SITE_OTHER): Admitting: Physician Assistant

## 2024-03-06 VITALS — BP 123/75 | HR 73 | Temp 98.5°F | Resp 16 | Ht 66.0 in | Wt 190.2 lb

## 2024-03-06 DIAGNOSIS — Z131 Encounter for screening for diabetes mellitus: Secondary | ICD-10-CM

## 2024-03-06 DIAGNOSIS — R5383 Other fatigue: Secondary | ICD-10-CM

## 2024-03-06 DIAGNOSIS — E782 Mixed hyperlipidemia: Secondary | ICD-10-CM | POA: Diagnosis not present

## 2024-03-06 DIAGNOSIS — R7989 Other specified abnormal findings of blood chemistry: Secondary | ICD-10-CM

## 2024-03-06 DIAGNOSIS — Z0001 Encounter for general adult medical examination with abnormal findings: Secondary | ICD-10-CM | POA: Diagnosis not present

## 2024-03-06 DIAGNOSIS — I10 Essential (primary) hypertension: Secondary | ICD-10-CM | POA: Diagnosis not present

## 2024-03-06 DIAGNOSIS — I739 Peripheral vascular disease, unspecified: Secondary | ICD-10-CM | POA: Diagnosis not present

## 2024-03-06 NOTE — Progress Notes (Signed)
 [facility] [address] [place], [place] [address]  Internal MEDICINE  Office Visit Note  Patient Name: Nicholas Torres  [id number]  [id number]  Date of Service: 03/06/2024  Chief Complaint  Patient presents with   Annual Exam   Hyperlipidemia   Hypertension     HPI Pt is here for routine health maintenance examination -Bp stable -Colonoscopy UTD done in 2024 -Still seeing urology -due for labs -following with cardiology q9months -also followed closely by vascular, does get some numbness intermittently on left leg more so than right. Will get some tingling at times.  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 Has been seeing chiropractor. Was seen by podiatry for flat feet and he wrote for orthotics but hasn't been able to get these yet. Goes back in a week. 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 further with the [place]  Current Medication: Outpatient Encounter Medications as of 03/06/2024  Medication Sig   AMBULATORY NON FORMULARY MEDICATION Trimix (30/1/10)-(Pap/Phent/PGE)  Test Dose  1ml vial   Qty #3 Refills 0  [facility] [phone] Fax [phone]   ASPIRIN  LOW DOSE 81 MG tablet TAKE 1 TABLET BY MOUTH EVERY DAY   clopidogrel  (PLAVIX ) 75 MG tablet TAKE 1 TABLET BY MOUTH EVERY DAY   cyclobenzaprine  (FLEXERIL ) 10 MG tablet TAKE 1 TABLET BY MOUTH AT BEDTIME FOR NECK SPASMS   dapagliflozin  propanediol (FARXIGA ) 10 MG TABS tablet Take 1 tablet (10 mg total) by mouth daily before breakfast.   fluticasone  (FLONASE ) 50 MCG/ACT nasal spray SPRAY 1 SPRAY INTO BOTH NOSTRILS DAILY.   levothyroxine  (SYNTHROID ) 25 MCG tablet TAKE 1 TAB DAILY BEFORE BREAKFAST ON EMPTY STOMACH, WAIT 30 MINUTES AFTER TAKING MEDICATION TO EAT OR TAKE OTHER MEDICATIONS.   lisinopril -hydrochlorothiazide  (ZESTORETIC ) 20-12.5 MG tablet TAKE 1 TABLET BY MOUTH EVERY DAY   methocarbamol  (ROBAXIN ) 500 MG tablet Take 1-2 tablets  (500-1,000 mg total) by mouth 4 (four) times daily.   metoprolol  succinate (TOPROL -XL) 25 MG 24 hr tablet TAKE 1/2 TABLET BY MOUTH EVERY DAY AS INSTRUCTED   pantoprazole  (PROTONIX ) 20 MG tablet TAKE 1 TABLET BY MOUTH EVERY DAY   rosuvastatin  (CRESTOR ) 40 MG tablet TAKE 1 TABLET BY MOUTH EVERY DAY   spironolactone (ALDACTONE) 25 MG tablet Take 25 mg by mouth daily.   tadalafil  (CIALIS ) 20 MG tablet Take 1 tablet (20 mg total) by mouth daily as needed for erectile dysfunction.   testosterone  (ANDROGEL ) 50 MG/5GM (1%) GEL Place 5 g onto the skin daily.   No facility-administered encounter medications on file as of 03/06/2024.    Surgical History: Past Surgical History:  Procedure Laterality Date   CARDIAC CATHETERIZATION Right 07/24/2016   Procedure: Left Heart Cath and Coronary Angiography;  Surgeon: Cherrie Cornwall, MD;  Location: [facility] INVASIVE CV LAB;  Service: Cardiovascular;  Laterality: Right;   COLONOSCOPY WITH PROPOFOL  N/A 10/08/2023   Procedure: COLONOSCOPY WITH PROPOFOL ;  Surgeon: Selena Daily, MD;  Location: [facility] ENDOSCOPY;  Service: Gastroenterology;  Laterality: N/A;   CORONARY ARTERY BYPASS GRAFT N/A 07/26/2016   Procedure: CORONARY ARTERY BYPASS GRAFTING (CABG), ON PUMP, TIMES FOUR, USING BILATERAL INTERNAL MAMMARY ARTERIES, RIGHT GREATER SAPHENOUS VEIN HARVESTED ENDOSCOPICALLY;  Surgeon: Zelphia Higashi, MD;  Location: [facility] OR;  Service: Open Heart Surgery;  Laterality: N/A;   ENDARTERECTOMY Right 01/22/2020   Procedure: ENDARTERECTOMY CAROTID;  Surgeon: Celso College, MD;  Location: [facility] ORS;  Service: Vascular;  Laterality: Right;  INGUINAL HERNIA REPAIR Right 04/16/2020   Procedure: HERNIA REPAIR INGUINAL ADULT;  Surgeon: Mercy Stall, MD;  Location: [facility] ORS;  Service: General;  Laterality: Right;   RADIAL ARTERY HARVEST Left 07/26/2016   Procedure: RADIAL LEFT ARTERY HARVEST;  Surgeon: Zelphia Higashi, MD;  Location: [facility] OR;  Service: Open Heart Surgery;   Laterality: Left;   TEE WITHOUT CARDIOVERSION N/A 07/26/2016   Procedure: TRANSESOPHAGEAL ECHOCARDIOGRAM (TEE);  Surgeon: Zelphia Higashi, MD;  Location: [facility] OR;  Service: Open Heart Surgery;  Laterality: N/A;    Medical History: Past Medical History:  Diagnosis Date   Arthritis    back   CAD (coronary artery disease) 2017   CHF (congestive heart failure) (HCC)    High cholesterol    Hypertension    Myocardial infarction [facility]) 2017   Pollen allergy     Family History: Family History  Problem Relation Age of Onset   Diabetes Mother    Heart attack Father    CAD Brother       Review of Systems  Constitutional:  Negative for chills, fatigue and unexpected weight change.  HENT:  Positive for tinnitus. Negative for postnasal drip, sneezing and sore throat.   Eyes:  Negative for redness.  Respiratory:  Negative for chest tightness, shortness of breath and wheezing.   Cardiovascular:  Negative for chest pain and palpitations.  Gastrointestinal:  Negative for abdominal pain, constipation, diarrhea, nausea and vomiting.  Genitourinary:  Negative for dysuria and frequency.  Musculoskeletal:  Positive for arthralgias and back pain. Negative for joint swelling.  Skin:  Negative for rash.  Neurological:  Positive for numbness. Negative for tremors.  Hematological:  Negative for adenopathy. Does not bruise/bleed easily.  Psychiatric/Behavioral:  Negative for behavioral problems (Depression) and suicidal ideas. The patient is not nervous/anxious.      Vital Signs: BP 123/75   Pulse 73   Temp 98.5 F (36.9 C)   Resp 16   Ht 5\' 6"  (1.676 m)   Wt 190 lb 3.2 oz (86.3 kg)   SpO2 98%   BMI 30.70 kg/m    Physical Exam Vitals and nursing note reviewed.  Constitutional:      General: He is not in acute distress.    Appearance: Normal appearance. He is well-developed. He is obese. He is not diaphoretic.  HENT:     Head: Normocephalic and atraumatic.     Mouth/Throat:      Pharynx: No oropharyngeal exudate.  Eyes:     Pupils: Pupils are equal, round, and reactive to light.  Neck:     Thyroid: No thyromegaly.     Vascular: No JVD.     Trachea: No tracheal deviation.  Cardiovascular:     Rate and Rhythm: Normal rate and regular rhythm.     Heart sounds: Normal heart sounds. No murmur heard.    No friction rub. No gallop.  Pulmonary:     Effort: Pulmonary effort is normal. No respiratory distress.     Breath sounds: No wheezing or rales.  Chest:     Chest wall: No tenderness.  Abdominal:     General: Bowel sounds are normal.     Palpations: Abdomen is soft.     Tenderness: There is no abdominal tenderness.  Musculoskeletal:        General: Normal range of motion.     Cervical back: Normal range of motion and neck supple.     Right lower leg: No edema.     Left lower  leg: No edema.  Lymphadenopathy:     Cervical: No cervical adenopathy.  Skin:    General: Skin is warm and dry.  Neurological:     Mental Status: He is alert and oriented to person, place, and time.  Psychiatric:        Behavior: Behavior normal.        Thought Content: Thought content normal.        Judgment: Judgment normal.      LABS: No results found for this or any previous visit (from the past 2160 hours).      Assessment/Plan: 1. Encounter for general adult medical examination with abnormal findings (Primary) CPE performed, UTD on colonoscopy, due for labs--ordered to [facility] per pt preference - CBC w/Diff/Platelet - Comprehensive metabolic panel with GFR - TSH + free T4 - HgB A1c - Lipid panel  2. Essential hypertension Stable, continue current medication  3. Abnormal thyroid blood test - TSH + free T4  4. Claudication, intermittent (HCC) Followed by vascular, though per vascular the symptoms/testing results make this vascular etiology unlikely and thought to possibly be due to low back. Pt reports known arthritis in back [facility]  5. Mixed hyperlipidemia Continue crestor  and will update labs - Lipid panel  6. Diabetes mellitus screening - HgB A1c  7. Other fatigue - CBC w/Diff/Platelet - Comprehensive metabolic panel with GFR - TSH + free T4 - HgB A1c - Lipid panel   General Counseling: Jarel verbalizes understanding of the findings of todays visit and agrees with plan of treatment. I have discussed any further diagnostic evaluation that may be needed or ordered today. We also reviewed his medications today. he has been encouraged to call the office with any questions or concerns that should arise related to todays visit.    Counseling:    Orders Placed This Encounter  Procedures   CBC w/Diff/Platelet   Comprehensive metabolic panel with GFR   TSH + free T4   HgB A1c   Lipid panel    No orders of the defined types were placed in this encounter.   This patient was seen by Taylor Favia, PA-C in collaboration with Dr. Verneta Gone as a part of collaborative care agreement.  Total time spent:35 Minutes  Time spent includes review of chart, medications, test results, and follow up plan with the patient.     Lawton Price, MD  Internal Medicine

## 2024-03-09 ENCOUNTER — Other Ambulatory Visit: Payer: Self-pay | Admitting: Physician Assistant

## 2024-03-11 LAB — TSH+FREE T4: TSH W/REFLEX TO FT4: 0.93 m[IU]/L (ref 0.40–4.50)

## 2024-03-11 LAB — HEMOGLOBIN A1C
Hgb A1c MFr Bld: 6.4 % — ABNORMAL HIGH (ref <5.7)
Mean Plasma Glucose: 137 mg/dL
eAG (mmol/L): 7.6 mmol/L

## 2024-03-11 LAB — CBC WITH DIFFERENTIAL/PLATELET
Absolute Lymphocytes: 2538 {cells}/uL (ref 850–3900)
Absolute Monocytes: 588 {cells}/uL (ref 200–950)
Basophils Absolute: 9 {cells}/uL (ref 0–200)
Basophils Relative: 0.2 %
Eosinophils Absolute: 103 {cells}/uL (ref 15–500)
Eosinophils Relative: 2.2 %
HCT: 44.1 % (ref 38.5–50.0)
Hemoglobin: 14.8 g/dL (ref 13.2–17.1)
MCH: 30.8 pg (ref 27.0–33.0)
MCHC: 33.6 g/dL (ref 32.0–36.0)
MCV: 91.9 fL (ref 80.0–100.0)
MPV: 12.3 fL (ref 7.5–12.5)
Monocytes Relative: 12.5 %
Neutro Abs: 1462 {cells}/uL — ABNORMAL LOW (ref 1500–7800)
Neutrophils Relative %: 31.1 %
Platelets: 127 10*3/uL — ABNORMAL LOW (ref 140–400)
RBC: 4.8 10*6/uL (ref 4.20–5.80)
RDW: 12.5 % (ref 11.0–15.0)
Total Lymphocyte: 54 %
WBC: 4.7 10*3/uL (ref 3.8–10.8)

## 2024-03-11 LAB — COMPREHENSIVE METABOLIC PANEL WITH GFR
AG Ratio: 1.5 (calc) (ref 1.0–2.5)
ALT: 21 U/L (ref 9–46)
AST: 30 U/L (ref 10–35)
Albumin: 4.5 g/dL (ref 3.6–5.1)
Alkaline phosphatase (APISO): 63 U/L (ref 35–144)
BUN/Creatinine Ratio: 16 (calc) (ref 6–22)
BUN: 22 mg/dL (ref 7–25)
CO2: 29 mmol/L (ref 20–32)
Calcium: 9.8 mg/dL (ref 8.6–10.3)
Chloride: 100 mmol/L (ref 98–110)
Creat: 1.39 mg/dL — ABNORMAL HIGH (ref 0.70–1.35)
Globulin: 3.1 g/dL (ref 1.9–3.7)
Glucose, Bld: 92 mg/dL (ref 65–99)
Potassium: 3.9 mmol/L (ref 3.5–5.3)
Sodium: 139 mmol/L (ref 135–146)
Total Bilirubin: 0.5 mg/dL (ref 0.2–1.2)
Total Protein: 7.6 g/dL (ref 6.1–8.1)
eGFR: 58 mL/min/{1.73_m2} — ABNORMAL LOW (ref >=60)

## 2024-03-11 LAB — LIPID PANEL
Cholesterol: 174 mg/dL (ref <200)
HDL: 58 mg/dL (ref >=40)
LDL Cholesterol (Calc): 101 mg/dL — ABNORMAL HIGH
Non-HDL Cholesterol (Calc): 116 mg/dL (ref <130)
Total CHOL/HDL Ratio: 3 (calc) (ref <5.0)
Triglycerides: 65 mg/dL (ref <150)

## 2024-03-14 ENCOUNTER — Other Ambulatory Visit

## 2024-03-28 ENCOUNTER — Other Ambulatory Visit: Payer: Self-pay | Admitting: Cardiovascular Disease

## 2024-03-28 DIAGNOSIS — I25118 Atherosclerotic heart disease of native coronary artery with other forms of angina pectoris: Secondary | ICD-10-CM

## 2024-04-01 ENCOUNTER — Encounter (INDEPENDENT_AMBULATORY_CARE_PROVIDER_SITE_OTHER): Payer: Self-pay

## 2024-04-02 ENCOUNTER — Ambulatory Visit: Payer: Self-pay | Admitting: Physician Assistant

## 2024-04-02 ENCOUNTER — Telehealth: Payer: Self-pay

## 2024-04-06 ENCOUNTER — Other Ambulatory Visit: Payer: Self-pay | Admitting: Cardiovascular Disease

## 2024-04-08 ENCOUNTER — Telehealth: Payer: Self-pay

## 2024-04-08 NOTE — Telephone Encounter (Signed)
 Done

## 2024-04-09 ENCOUNTER — Other Ambulatory Visit: Payer: Self-pay | Admitting: Physician Assistant

## 2024-04-09 DIAGNOSIS — D709 Neutropenia, unspecified: Secondary | ICD-10-CM

## 2024-04-09 DIAGNOSIS — D696 Thrombocytopenia, unspecified: Secondary | ICD-10-CM

## 2024-04-09 NOTE — Telephone Encounter (Signed)
 Done

## 2024-04-16 ENCOUNTER — Inpatient Hospital Stay

## 2024-04-16 ENCOUNTER — Inpatient Hospital Stay: Attending: Internal Medicine | Admitting: Internal Medicine

## 2024-04-16 ENCOUNTER — Encounter: Payer: Self-pay | Admitting: Internal Medicine

## 2024-04-16 VITALS — BP 122/80 | HR 51 | Temp 98.5°F | Resp 16 | Ht 66.0 in | Wt 185.0 lb

## 2024-04-16 DIAGNOSIS — D696 Thrombocytopenia, unspecified: Secondary | ICD-10-CM | POA: Diagnosis not present

## 2024-04-16 DIAGNOSIS — I110 Hypertensive heart disease with heart failure: Secondary | ICD-10-CM | POA: Diagnosis not present

## 2024-04-16 DIAGNOSIS — I2510 Atherosclerotic heart disease of native coronary artery without angina pectoris: Secondary | ICD-10-CM | POA: Diagnosis not present

## 2024-04-16 DIAGNOSIS — I509 Heart failure, unspecified: Secondary | ICD-10-CM | POA: Diagnosis not present

## 2024-04-16 LAB — CBC WITH DIFFERENTIAL/PLATELET
Abs Immature Granulocytes: 0.02 10*3/uL (ref 0.00–0.07)
Basophils Absolute: 0 10*3/uL (ref 0.0–0.1)
Basophils Relative: 0 %
Eosinophils Absolute: 0.1 10*3/uL (ref 0.0–0.5)
Eosinophils Relative: 1 %
HCT: 44 % (ref 39.0–52.0)
Hemoglobin: 14.3 g/dL (ref 13.0–17.0)
Immature Granulocytes: 1 %
Lymphocytes Relative: 52 %
Lymphs Abs: 2.3 10*3/uL (ref 0.7–4.0)
MCH: 31 pg (ref 26.0–34.0)
MCHC: 32.5 g/dL (ref 30.0–36.0)
MCV: 95.2 fL (ref 80.0–100.0)
Monocytes Absolute: 0.4 10*3/uL (ref 0.1–1.0)
Monocytes Relative: 9 %
Neutro Abs: 1.6 10*3/uL — ABNORMAL LOW (ref 1.7–7.7)
Neutrophils Relative %: 37 %
Platelets: 130 10*3/uL — ABNORMAL LOW (ref 150–400)
RBC: 4.62 MIL/uL (ref 4.22–5.81)
RDW: 12.7 % (ref 11.5–15.5)
Smear Review: DECREASED
WBC: 4.4 10*3/uL (ref 4.0–10.5)
nRBC: 0 % (ref 0.0–0.2)

## 2024-04-16 LAB — TECHNOLOGIST SMEAR REVIEW: Plt Morphology: DECREASED

## 2024-04-16 LAB — COMPREHENSIVE METABOLIC PANEL WITH GFR
ALT: 22 U/L (ref 0–44)
AST: 27 U/L (ref 15–41)
Albumin: 4.1 g/dL (ref 3.5–5.0)
Alkaline Phosphatase: 57 U/L (ref 38–126)
Anion gap: 8 (ref 5–15)
BUN: 21 mg/dL — ABNORMAL HIGH (ref 6–20)
CO2: 27 mmol/L (ref 22–32)
Calcium: 9.1 mg/dL (ref 8.9–10.3)
Chloride: 101 mmol/L (ref 98–111)
Creatinine, Ser: 1.3 mg/dL — ABNORMAL HIGH (ref 0.61–1.24)
GFR, Estimated: >60 mL/min (ref >60)
Glucose, Bld: 87 mg/dL (ref 70–99)
Potassium: 4 mmol/L (ref 3.5–5.1)
Sodium: 136 mmol/L (ref 135–145)
Total Bilirubin: 0.9 mg/dL (ref 0.0–1.2)
Total Protein: 7.7 g/dL (ref 6.5–8.1)

## 2024-04-16 LAB — LACTATE DEHYDROGENASE: LDH: 125 U/L (ref 98–192)

## 2024-04-16 LAB — HEPATITIS B SURFACE ANTIGEN: Hepatitis B Surface Ag: NONREACTIVE

## 2024-04-16 LAB — IMMATURE PLATELET FRACTION: Immature Platelet Fraction: 11.6 % — ABNORMAL HIGH (ref 1.2–8.6)

## 2024-04-16 LAB — HEPATITIS B CORE ANTIBODY, IGM: Hep B C IgM: NONREACTIVE

## 2024-04-16 LAB — VITAMIN B12: Vitamin B-12: 210 pg/mL (ref 180–914)

## 2024-04-16 LAB — HEPATITIS C ANTIBODY: HCV Ab: NONREACTIVE

## 2024-04-16 NOTE — Assessment & Plan Note (Addendum)
 Thrombocytopenia: Mild > 100 [2017]The etiology is unclear.  Clinically asymptomatic.  I had long discussion with patient regarding multiple etiologies including but not limited to liver disease/medications/autoimmune disease-ITP etc.  Less likely any malignant causes.    # However clinically suggestive of ITP chronic.  However I discussed that ITP is a diagnosis of exclusion.  ITP is usually treated with steroids.  Hold off any steroids at this time as patient is fairly asymptomatic.  CT scan FEb 2021- no splen/liver megaly or cirrhosis.  Recommend further work-up including-CBC CMP LDH; review of peripheral smear; HIV/hepatitis work-up. Discussed the role of a bone marrow biopsy for further evaluation of hematologic disorder.  However hold off pending above work-up.   # CAD- s/p CABG- [2017]; Dr.Khan - on asprin + plavix -   # Thank you Ms.McDouugh. for allowing me to participate in the care of your pleasant patient. Please do not hesitate to contact me with questions or concerns in the interim.  # DISPOSITION: # labs today- CBC CMP LDH; review of peripheral smear;immature platelet fraction;- HIV/hepatitis- ORDERED  # follow up in 2-3 weeks- MD; no labs- Dr.B

## 2024-04-16 NOTE — Progress Notes (Signed)
 [facility] CONSULT NOTE  Patient Care Team: McDonough, Arlyn Bergeron as PCP - General (Physician Assistant) Gwyn Leos, MD as Consulting Physician (Oncology) Gwyn Leos, MD as Consulting Physician (Hematology and Oncology)  CHIEF COMPLAINTS/PURPOSE OF CONSULTATION: Thrombocytopenia  HISTORY OF PRESENTING ILLNESS: Patient ambulating-independently Accompanied  with significant other  Nicholas Torres 63 y.o.  male with a history of CAD-on aspirin  and Plavix  referred to us  for further evaluation of thrombocytopenia.  Prior known history of thrombocytopenia: Since 2017.  Patient platelets have mostly been above 100.  However at this time of quadruple bypass in 2017 his platelets were down to 90s.  Easy bruising/bleeding: none.  Patient denies any weight loss but denies any new lumps or bumps.  No night sweats or fatigue.  Medications: Aspirin  Plavix .  Alcohol:none; no smoking-   Hepatitis/HIV: none  Liver disease/CHF; none-   Review of Systems  Constitutional:  Negative for chills, diaphoresis, fever, malaise/fatigue and weight loss.  HENT:  Negative for nosebleeds and sore throat.   Eyes:  Negative for double vision.  Respiratory:  Negative for cough, hemoptysis, sputum production, shortness of breath and wheezing.   Cardiovascular:  Negative for chest pain, palpitations, orthopnea and leg swelling.  Gastrointestinal:  Negative for abdominal pain, blood in stool, constipation, diarrhea, heartburn, melena, nausea and vomiting.  Genitourinary:  Negative for dysuria, frequency and urgency.  Musculoskeletal:  Negative for back pain and joint pain.  Skin: Negative.  Negative for itching and rash.  Neurological:  Negative for dizziness, tingling, focal weakness, weakness and headaches.  Endo/Heme/Allergies:  Does not bruise/bleed easily.  Psychiatric/Behavioral:  Negative for depression. The patient is not nervous/anxious and does not have insomnia.       MEDICAL HISTORY:  Past Medical History:  Diagnosis Date   Arthritis    back   CAD (coronary artery disease) 2017   CHF (congestive heart failure) (HCC)    High cholesterol    Hypertension    Myocardial infarction (HCC) 2017   Pollen allergy     SURGICAL HISTORY: Past Surgical History:  Procedure Laterality Date   CARDIAC CATHETERIZATION Right 07/24/2016   Procedure: Left Heart Cath and Coronary Angiography;  Surgeon: Cherrie Cornwall, MD;  Location: [facility] INVASIVE CV LAB;  Service: Cardiovascular;  Laterality: Right;   COLONOSCOPY WITH PROPOFOL  N/A 10/08/2023   Procedure: COLONOSCOPY WITH PROPOFOL ;  Surgeon: Selena Daily, MD;  Location: [facility] ENDOSCOPY;  Service: Gastroenterology;  Laterality: N/A;   CORONARY ARTERY BYPASS GRAFT N/A 07/26/2016   Procedure: CORONARY ARTERY BYPASS GRAFTING (CABG), ON PUMP, TIMES FOUR, USING BILATERAL INTERNAL MAMMARY ARTERIES, RIGHT GREATER SAPHENOUS VEIN HARVESTED ENDOSCOPICALLY;  Surgeon: Zelphia Higashi, MD;  Location: [facility] OR;  Service: Open Heart Surgery;  Laterality: N/A;   ENDARTERECTOMY Right 01/22/2020   Procedure: ENDARTERECTOMY CAROTID;  Surgeon: Celso College, MD;  Location: [facility] ORS;  Service: Vascular;  Laterality: Right;   INGUINAL HERNIA REPAIR Right 04/16/2020   Procedure: HERNIA REPAIR INGUINAL ADULT;  Surgeon: Mercy Stall, MD;  Location: [facility] ORS;  Service: General;  Laterality: Right;   RADIAL ARTERY HARVEST Left 07/26/2016   Procedure: RADIAL LEFT ARTERY HARVEST;  Surgeon: Zelphia Higashi, MD;  Location: [facility] OR;  Service: Open Heart Surgery;  Laterality: Left;   TEE WITHOUT CARDIOVERSION N/A 07/26/2016   Procedure: TRANSESOPHAGEAL ECHOCARDIOGRAM (TEE);  Surgeon: Zelphia Higashi, MD;  Location: [facility] OR;  Service: Open Heart Surgery;  Laterality: N/A;    SOCIAL HISTORY: Social History   Socioeconomic  History   Marital status: Married    Spouse name: Not on file   Number of children: Not on file   Years of  education: Not on file   Highest education level: Not on file  Occupational History   Not on file  Tobacco Use   Smoking status: Never    Passive exposure: Never   Smokeless tobacco: Never  Vaping Use   Vaping status: Never Used  Substance and Sexual Activity   Alcohol use: No   Drug use: No   Sexual activity: Yes  Other Topics Concern   Not on file  Social History Narrative   Not on file   Social Drivers of Health   Financial Resource Strain: Low Risk  (03/25/2024)   Received from [facility]   Overall Financial Resource Strain (CARDIA)    Difficulty of Paying Living Expenses: Not hard at all  Food Insecurity: No Food Insecurity (03/25/2024)   Received from [facility]   Hunger Vital Sign    Worried About Running Out of Food in the Last Year: Never true    Ran Out of Food in the Last Year: Never true  Transportation Needs: No Transportation Needs (03/25/2024)   Received from [facility] - Transportation    Lack of Transportation (Medical): No    Lack of Transportation (Non-Medical): No  Physical Activity: Not on file  Stress: Not on file  Social Connections: Not on file  Intimate Partner Violence: Not on file    FAMILY HISTORY: Family History  Problem Relation Age of Onset   Diabetes Mother    Heart attack Father    CAD Brother    Thyroid cancer Brother     ALLERGIES:  has no known allergies.  MEDICATIONS:  Current Outpatient Medications  Medication Sig Dispense Refill   ASPIRIN  LOW DOSE 81 MG tablet TAKE 1 TABLET BY MOUTH EVERY DAY 90 tablet 3   clopidogrel  (PLAVIX ) 75 MG tablet TAKE 1 TABLET BY MOUTH EVERY DAY 90 tablet 1   cyclobenzaprine  (FLEXERIL ) 10 MG tablet TAKE 1 TABLET BY MOUTH AT BEDTIME FOR NECK SPASMS 30 tablet 2   dapagliflozin  propanediol (FARXIGA ) 10 MG TABS tablet Take 1 tablet (10 mg total) by mouth daily before breakfast. 30 tablet 3   fluticasone  (FLONASE ) 50 MCG/ACT nasal spray SPRAY 1 SPRAY INTO BOTH NOSTRILS DAILY. 48 mL  1   levothyroxine  (SYNTHROID ) 25 MCG tablet TAKE 1 TAB DAILY BEFORE BREAKFAST ON EMPTY STOMACH, WAIT 30 MINUTES AFTER TAKING MEDICATION TO EAT OR TAKE OTHER MEDICATIONS. 90 tablet 0   lisinopril -hydrochlorothiazide  (ZESTORETIC ) 20-12.5 MG tablet TAKE 1 TABLET BY MOUTH EVERY DAY 90 tablet 1   methocarbamol  (ROBAXIN ) 500 MG tablet Take 1-2 tablets (500-1,000 mg total) by mouth 4 (four) times daily. 120 tablet 3   metoprolol  succinate (TOPROL -XL) 25 MG 24 hr tablet TAKE 1/2 TABLET BY MOUTH EVERY DAY AS INSTRUCTED 45 tablet 0   pantoprazole  (PROTONIX ) 20 MG tablet TAKE 1 TABLET BY MOUTH EVERY DAY 90 tablet 1   rosuvastatin  (CRESTOR ) 40 MG tablet TAKE 1 TABLET BY MOUTH EVERY DAY 90 tablet 1   spironolactone (ALDACTONE) 25 MG tablet Take 25 mg by mouth daily.     tadalafil  (CIALIS ) 20 MG tablet Take 1 tablet (20 mg total) by mouth daily as needed for erectile dysfunction. 30 tablet 3   testosterone  (ANDROGEL ) 50 MG/5GM (1%) GEL Place 5 g onto the skin daily. (Patient not taking: Reported on 04/16/2024) 150 g 3  No current facility-administered medications for this visit.    PHYSICAL EXAMINATION:  Vitals:   04/16/24 1053  BP: 122/80  Pulse: (!) 51  Resp: 16  Temp: 98.5 F (36.9 C)  SpO2: 100%   Filed Weights   04/16/24 1053  Weight: 185 lb (83.9 kg)    Physical Exam Vitals and nursing note reviewed.  HENT:     Head: Normocephalic and atraumatic.     Mouth/Throat:     Pharynx: Oropharynx is clear.  Eyes:     Extraocular Movements: Extraocular movements intact.     Pupils: Pupils are equal, round, and reactive to light.  Cardiovascular:     Rate and Rhythm: Normal rate and regular rhythm.  Pulmonary:     Comments: Decreased breath sounds bilaterally.  Abdominal:     Palpations: Abdomen is soft.  Musculoskeletal:        General: Normal range of motion.     Cervical back: Normal range of motion.  Skin:    General: Skin is warm.  Neurological:     General: No focal deficit  present.     Mental Status: He is alert and oriented to person, place, and time.  Psychiatric:        Behavior: Behavior normal.        Judgment: Judgment normal.      LABORATORY DATA:  I have reviewed the data as listed Lab Results  Component Value Date   WBC 4.7 03/10/2024   HGB 14.8 03/10/2024   HCT 44.1 03/10/2024   MCV 91.9 03/10/2024   PLT 127 (L) 03/10/2024   Recent Labs    03/10/24 1032  NA 139  K 3.9  CL 100  CO2 29  GLUCOSE 92  BUN 22  CREATININE 1.39*  CALCIUM  9.8  PROT 7.6  AST 30  ALT 21  BILITOT 0.5    RADIOGRAPHIC STUDIES: I have personally reviewed the radiological images as listed and agreed with the findings in the report. No results found.  Thrombocytopenia (HCC) Thrombocytopenia: Mild > 100 [2017]The etiology is unclear.  Clinically asymptomatic.  I had long discussion with patient regarding multiple etiologies including but not limited to liver disease/medications/autoimmune disease-ITP etc.  Less likely any malignant causes.    # However clinically suggestive of ITP chronic.  However I discussed that ITP is a diagnosis of exclusion.  ITP is usually treated with steroids.  Hold off any steroids at this time as patient is fairly asymptomatic.  CT scan FEb 2021- no splen/liver megaly or cirrhosis.  Recommend further work-up including-CBC CMP LDH; review of peripheral smear; HIV/hepatitis work-up. Discussed the role of a bone marrow biopsy for further evaluation of hematologic disorder.  However hold off pending above work-up.   # CAD- s/p CABG- [2017]; Dr.Khan - on asprin + plavix -   # Thank you Ms.McDouugh. for allowing me to participate in the care of your pleasant patient. Please do not hesitate to contact me with questions or concerns in the interim.  # DISPOSITION: # labs today- CBC CMP LDH; review of peripheral smear;immature platelet fraction;- HIV/hepatitis- ORDERED  # follow up in 2-3 weeks- MD; no labs- Dr.B  All questions were  answered. The patient knows to call the clinic with any problems, questions or concerns.     Gwyn Leos, MD 04/16/2024 12:21 PM

## 2024-04-16 NOTE — Progress Notes (Signed)
 Pt states testosterone  is low but can't get his gel due to insurance stating there is something else that should go along with the gel, he's not sure what that is. Asking what he can do do bring number up? Seeing Urology McGowan at [facility]  C/o trouble breathing at times.   At times ,when he yawns he gets a cramp/lump in the right neck at his surgery site from 2021, he has notified Dr. Meredeth Stallion.   Fever: NO Night sweats:  NO

## 2024-04-17 LAB — HAPTOGLOBIN: Haptoglobin: 145 mg/dL (ref 29–370)

## 2024-04-17 LAB — HIV ANTIBODY (ROUTINE TESTING W REFLEX): HIV Screen 4th Generation wRfx: NONREACTIVE

## 2024-04-19 ENCOUNTER — Other Ambulatory Visit: Payer: Self-pay | Admitting: Cardiovascular Disease

## 2024-04-19 DIAGNOSIS — I2510 Atherosclerotic heart disease of native coronary artery without angina pectoris: Secondary | ICD-10-CM

## 2024-05-01 ENCOUNTER — Encounter: Payer: Self-pay | Admitting: Cardiovascular Disease

## 2024-05-01 ENCOUNTER — Ambulatory Visit (INDEPENDENT_AMBULATORY_CARE_PROVIDER_SITE_OTHER): Admitting: Cardiovascular Disease

## 2024-05-01 VITALS — BP 105/64 | HR 60 | Ht 66.0 in | Wt 184.0 lb

## 2024-05-01 DIAGNOSIS — Z951 Presence of aortocoronary bypass graft: Secondary | ICD-10-CM | POA: Diagnosis not present

## 2024-05-01 DIAGNOSIS — I25118 Atherosclerotic heart disease of native coronary artery with other forms of angina pectoris: Secondary | ICD-10-CM

## 2024-05-01 DIAGNOSIS — R0602 Shortness of breath: Secondary | ICD-10-CM

## 2024-05-01 DIAGNOSIS — I10 Essential (primary) hypertension: Secondary | ICD-10-CM

## 2024-05-01 DIAGNOSIS — E782 Mixed hyperlipidemia: Secondary | ICD-10-CM

## 2024-05-01 DIAGNOSIS — I5033 Acute on chronic diastolic (congestive) heart failure: Secondary | ICD-10-CM | POA: Diagnosis not present

## 2024-05-01 MED ORDER — DAPAGLIFLOZIN PROPANEDIOL 10 MG PO TABS: 10.0000 mg | ORAL_TABLET | Freq: Every day | ORAL | 3 refills | Status: DC

## 2024-05-01 MED ORDER — LISINOPRIL 20 MG PO TABS: 20.0000 mg | ORAL_TABLET | Freq: Every day | ORAL | 11 refills | Status: AC

## 2024-05-01 NOTE — Progress Notes (Signed)
 Cardiology Office Note   Date:  05/01/2024   ID:  Nicholas Torres, DOB [birth year], MRN [id number]  PCP:  Jacques Mattock, PA-C  Cardiologist:  Debborah Fairly, MD      History of Present Illness: Nicholas Torres is a 63 y.o. male who presents for  Chief Complaint  Patient presents with   Follow-up    3 month follow up    Had syncope in past but no dizziness now.      Past Medical History:  Diagnosis Date   Arthritis    back   CAD (coronary artery disease) 2017   CHF (congestive heart failure) (HCC)    High cholesterol    Hypertension    Myocardial infarction [facility]) 2017   Pollen allergy      Past Surgical History:  Procedure Laterality Date   CARDIAC CATHETERIZATION Right 07/24/2016   Procedure: Left Heart Cath and Coronary Angiography;  Surgeon: Cherrie Cornwall, MD;  Location: [facility] INVASIVE CV LAB;  Service: Cardiovascular;  Laterality: Right;   COLONOSCOPY WITH PROPOFOL  N/A 10/08/2023   Procedure: COLONOSCOPY WITH PROPOFOL ;  Surgeon: Selena Daily, MD;  Location: [facility] ENDOSCOPY;  Service: Gastroenterology;  Laterality: N/A;   CORONARY ARTERY BYPASS GRAFT N/A 07/26/2016   Procedure: CORONARY ARTERY BYPASS GRAFTING (CABG), ON PUMP, TIMES FOUR, USING BILATERAL INTERNAL MAMMARY ARTERIES, RIGHT GREATER SAPHENOUS VEIN HARVESTED ENDOSCOPICALLY;  Surgeon: Zelphia Higashi, MD;  Location: [facility] OR;  Service: Open Heart Surgery;  Laterality: N/A;   ENDARTERECTOMY Right 01/22/2020   Procedure: ENDARTERECTOMY CAROTID;  Surgeon: Celso College, MD;  Location: [facility] ORS;  Service: Vascular;  Laterality: Right;   INGUINAL HERNIA REPAIR Right 04/16/2020   Procedure: HERNIA REPAIR INGUINAL ADULT;  Surgeon: Mercy Stall, MD;  Location: [facility] ORS;  Service: General;  Laterality: Right;   RADIAL ARTERY HARVEST Left 07/26/2016   Procedure: RADIAL LEFT ARTERY HARVEST;  Surgeon: Zelphia Higashi, MD;  Location: [facility] OR;  Service: Open Heart Surgery;  Laterality: Left;   TEE  WITHOUT CARDIOVERSION N/A 07/26/2016   Procedure: TRANSESOPHAGEAL ECHOCARDIOGRAM (TEE);  Surgeon: Zelphia Higashi, MD;  Location: [facility] OR;  Service: Open Heart Surgery;  Laterality: N/A;     Current Outpatient Medications  Medication Sig Dispense Refill   ASPIRIN  LOW DOSE 81 MG tablet TAKE 1 TABLET BY MOUTH EVERY DAY 90 tablet 3   clopidogrel  (PLAVIX ) 75 MG tablet TAKE 1 TABLET BY MOUTH EVERY DAY 90 tablet 1   cyclobenzaprine  (FLEXERIL ) 10 MG tablet TAKE 1 TABLET BY MOUTH AT BEDTIME FOR NECK SPASMS 30 tablet 2   fluticasone  (FLONASE ) 50 MCG/ACT nasal spray SPRAY 1 SPRAY INTO BOTH NOSTRILS DAILY. 48 mL 1   levothyroxine  (SYNTHROID ) 25 MCG tablet TAKE 1 TAB DAILY BEFORE BREAKFAST ON EMPTY STOMACH, WAIT 30 MINUTES AFTER TAKING MEDICATION TO EAT OR TAKE OTHER MEDICATIONS. 90 tablet 0   lisinopril  (ZESTRIL ) 20 MG tablet Take 1 tablet (20 mg total) by mouth daily. 30 tablet 11   methocarbamol  (ROBAXIN ) 500 MG tablet Take 1-2 tablets (500-1,000 mg total) by mouth 4 (four) times daily. (Patient taking differently: Take 500-1,000 mg by mouth as needed.) 120 tablet 3   metoprolol  succinate (TOPROL -XL) 25 MG 24 hr tablet TAKE 1/2 TABLET BY MOUTH EVERY DAY AS INSTRUCTED 45 tablet 0   pantoprazole  (PROTONIX ) 20 MG tablet TAKE 1 TABLET BY MOUTH EVERY DAY 90 tablet 1   rosuvastatin  (CRESTOR ) 40 MG tablet TAKE 1 TABLET BY MOUTH EVERY DAY 90 tablet 1  spironolactone (ALDACTONE) 25 MG tablet Take 25 mg by mouth daily.     tadalafil  (CIALIS ) 20 MG tablet Take 1 tablet (20 mg total) by mouth daily as needed for erectile dysfunction. 30 tablet 3   dapagliflozin  propanediol (FARXIGA ) 10 MG TABS tablet Take 1 tablet (10 mg total) by mouth daily before breakfast. 30 tablet 3   testosterone  (ANDROGEL ) 50 MG/5GM (1%) GEL Place 5 g onto the skin daily. (Patient not taking: Reported on 05/01/2024) 150 g 3   No current facility-administered medications for this visit.    Allergies:   Patient has no known allergies.     Social History:   reports that he has never smoked. He has never been exposed to tobacco smoke. He has never used smokeless tobacco. He reports that he does not drink alcohol and does not use drugs.   Family History:  family history includes CAD in his brother; Diabetes in his mother; Heart attack in his father; Thyroid cancer in his brother.    ROS:     Review of Systems  Constitutional: Negative.   HENT: Negative.    Eyes: Negative.   Respiratory: Negative.    Gastrointestinal: Negative.   Genitourinary: Negative.   Musculoskeletal: Negative.   Skin: Negative.   Neurological: Negative.   Endo/Heme/Allergies: Negative.   Psychiatric/Behavioral: Negative.    All other systems reviewed and are negative.     All other systems are reviewed and negative.    PHYSICAL EXAM: VS:  BP 105/64   Pulse 60   Ht 5' 6 (1.676 m)   Wt 184 lb (83.5 kg)   SpO2 99%   BMI 29.70 kg/m  , BMI Body mass index is 29.7 kg/m. Last weight:  Wt Readings from Last 3 Encounters:  05/01/24 184 lb (83.5 kg)  04/16/24 185 lb (83.9 kg)  03/06/24 190 lb 3.2 oz (86.3 kg)     Physical Exam Vitals reviewed.  Constitutional:      Appearance: Normal appearance. He is normal weight.  HENT:     Head: Normocephalic.     Nose: Nose normal.     Mouth/Throat:     Mouth: Mucous membranes are moist.   Eyes:     Pupils: Pupils are equal, round, and reactive to light.    Cardiovascular:     Rate and Rhythm: Normal rate and regular rhythm.     Pulses: Normal pulses.     Heart sounds: Normal heart sounds.  Pulmonary:     Effort: Pulmonary effort is normal.  Abdominal:     General: Abdomen is flat. Bowel sounds are normal.   Musculoskeletal:        General: Normal range of motion.     Cervical back: Normal range of motion.   Skin:    General: Skin is warm.   Neurological:     General: No focal deficit present.     Mental Status: He is alert.   Psychiatric:        Mood and Affect: Mood  normal.       EKG:   Recent Labs: 04/16/2024: ALT 22; BUN 21; Creatinine, Ser 1.30; Hemoglobin 14.3; Platelets 130; Potassium 4.0; Sodium 136    Lipid Panel    Component Value Date/Time   CHOL 174 03/10/2024 1032   TRIG 65 03/10/2024 1032   HDL 58 03/10/2024 1032   CHOLHDL 3.0 03/10/2024 1032   VLDL 17 07/24/2016 0612   LDLCALC 101 (H) 03/10/2024 1032      Other studies  Reviewed: Additional studies/ records that were reviewed today include:  Review of the above records demonstrates:       No data to display            ASSESSMENT AND PLAN:    ICD-10-CM   1. S/P CABG x 4  Z95.1 lisinopril  (ZESTRIL ) 20 MG tablet    dapagliflozin  propanediol (FARXIGA ) 10 MG TABS tablet    2. Essential hypertension  I10 lisinopril  (ZESTRIL ) 20 MG tablet    dapagliflozin  propanediol (FARXIGA ) 10 MG TABS tablet   Change lisinopril   HCTZ20/125    3. CHF (congestive heart failure), NYHA class III, acute on chronic, diastolic (HCC)  I50.33 lisinopril  (ZESTRIL ) 20 MG tablet    dapagliflozin  propanediol (FARXIGA ) 10 MG TABS tablet    4. SOB (shortness of breath)  R06.02 lisinopril  (ZESTRIL ) 20 MG tablet    dapagliflozin  propanediol (FARXIGA ) 10 MG TABS tablet   No SOB, farxiga  helping    5. Mixed hyperlipidemia  E78.2 lisinopril  (ZESTRIL ) 20 MG tablet    dapagliflozin  propanediol (FARXIGA ) 10 MG TABS tablet    6. Coronary artery disease of native artery of native heart with stable angina pectoris (HCC)  I25.118 lisinopril  (ZESTRIL ) 20 MG tablet    dapagliflozin  propanediol (FARXIGA ) 10 MG TABS tablet    7. CHF (congestive heart failure), NYHA class III, acute on chronic, diastolic (HCC)  I50.33 lisinopril  (ZESTRIL ) 20 MG tablet    dapagliflozin  propanediol (FARXIGA ) 10 MG TABS tablet   add farxiga        Problem List Items Addressed This Visit       Cardiovascular and Mediastinum   CAD (coronary artery disease)   Relevant Medications   lisinopril  (ZESTRIL ) 20 MG tablet    dapagliflozin  propanediol (FARXIGA ) 10 MG TABS tablet   Essential hypertension   Relevant Medications   lisinopril  (ZESTRIL ) 20 MG tablet   dapagliflozin  propanediol (FARXIGA ) 10 MG TABS tablet     Other   S/P CABG x 4 - Primary   Relevant Medications   lisinopril  (ZESTRIL ) 20 MG tablet   dapagliflozin  propanediol (FARXIGA ) 10 MG TABS tablet   Mixed hyperlipidemia   Relevant Medications   lisinopril  (ZESTRIL ) 20 MG tablet   dapagliflozin  propanediol (FARXIGA ) 10 MG TABS tablet   SOB (shortness of breath)   Relevant Medications   lisinopril  (ZESTRIL ) 20 MG tablet   dapagliflozin  propanediol (FARXIGA ) 10 MG TABS tablet   Other Visit Diagnoses       CHF (congestive heart failure), NYHA class III, acute on chronic, diastolic (HCC)       Relevant Medications   lisinopril  (ZESTRIL ) 20 MG tablet   dapagliflozin  propanediol (FARXIGA ) 10 MG TABS tablet     CHF (congestive heart failure), NYHA class III, acute on chronic, diastolic (HCC)       add farxiga    Relevant Medications   lisinopril  (ZESTRIL ) 20 MG tablet   dapagliflozin  propanediol (FARXIGA ) 10 MG TABS tablet          Disposition:   No follow-ups on file.    Total time spent: 30 minutes  Signed,  Debborah Fairly, MD  05/01/2024 10:22 AM    [facility]

## 2024-05-07 ENCOUNTER — Other Ambulatory Visit: Payer: Self-pay | Admitting: Family

## 2024-05-07 MED ORDER — EMPAGLIFLOZIN 25 MG PO TABS: 25.0000 mg | ORAL_TABLET | Freq: Every day | ORAL | 1 refills | Status: DC

## 2024-05-12 ENCOUNTER — Inpatient Hospital Stay (HOSPITAL_BASED_OUTPATIENT_CLINIC_OR_DEPARTMENT_OTHER): Admitting: Internal Medicine

## 2024-05-12 ENCOUNTER — Encounter: Payer: Self-pay | Admitting: Internal Medicine

## 2024-05-12 VITALS — BP 117/83 | HR 56 | Temp 98.4°F | Resp 16 | Ht 66.0 in | Wt 180.0 lb

## 2024-05-12 DIAGNOSIS — D696 Thrombocytopenia, unspecified: Secondary | ICD-10-CM

## 2024-05-12 NOTE — Progress Notes (Signed)
 Here today to go over lab results for thrombocytopenia.

## 2024-05-12 NOTE — Assessment & Plan Note (Addendum)
 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 immature platelet fraction;- HIV/hepatitis- WNL. HOLD off bone marrow biopsy.   # I discussed that  ITP is usually treated with steroids or rituxan infusion or TPO agents.  Hold off any treatment at this time as patient is fairly asymptomatic.  CT scan FEb 2021- no splen/liver megaly or cirrhosis.  # Mild neutropenia ANC 1.6-normal white count.  Patient is asymptomatic.  Likely benign ethnic neutropenia.  Monitor for now.  # CAD- s/p CABG- [2017]; Dr.Khan - on asprin + plavix -   # Boderlien LOW B12 - # Low B12 : discussed with patient importance optimize B12-to help with blood/and nerves.  Recommend B12 sublingual 1000 mcg once a day [under the tongue; helps with better absorption].  Will Recheck b12, if not improved the plan B12 injections.   # DISPOSITION: # follow up in 6 month/JAN 2nd week- MD;  labs cbc/cmp; b12 levels-- Dr.B

## 2024-05-12 NOTE — Progress Notes (Signed)
 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  He denies any easy bruising or bleeding.  Denies any lumps or bumps.  Review of Systems  Constitutional:  Negative for chills, diaphoresis, fever, malaise/fatigue and weight loss.  HENT:  Negative for nosebleeds and sore throat.   Eyes:  Negative for double vision.  Respiratory:  Negative for cough, hemoptysis, sputum production, shortness of breath and wheezing.   Cardiovascular:  Negative for chest pain, palpitations, orthopnea and leg swelling.  Gastrointestinal:  Negative for abdominal pain, blood in stool, constipation, diarrhea, heartburn, melena, nausea and vomiting.  Genitourinary:  Negative for dysuria, frequency and urgency.  Musculoskeletal:  Negative for back pain and joint pain.  Skin: Negative.  Negative for itching and rash.  Neurological:  Negative for dizziness, tingling, focal weakness, weakness and headaches.  Endo/Heme/Allergies:  Does not bruise/bleed easily.  Psychiatric/Behavioral:  Negative for depression. The patient is not nervous/anxious and does not have insomnia.      MEDICAL HISTORY:  Past Medical History:  Diagnosis Date   Arthritis    back   CAD (coronary artery disease) 2017   CHF (congestive heart failure) (HCC)    High cholesterol    Hypertension    Myocardial infarction (HCC) 2017   Pollen allergy     SURGICAL HISTORY: Past Surgical History:  Procedure Laterality Date   CARDIAC  CATHETERIZATION Right 07/24/2016   Procedure: Left Heart Cath and Coronary Angiography;  Surgeon: Denyse DELENA Bathe, MD;  Location: [facility] INVASIVE CV LAB;  Service: Cardiovascular;  Laterality: Right;   COLONOSCOPY WITH PROPOFOL  N/A 10/08/2023   Procedure: COLONOSCOPY WITH PROPOFOL ;  Surgeon: Unk Corinn Skiff, MD;  Location: [facility] ENDOSCOPY;  Service: Gastroenterology;  Laterality: N/A;   CORONARY ARTERY BYPASS GRAFT N/A 07/26/2016   Procedure: CORONARY ARTERY BYPASS GRAFTING (CABG), ON PUMP, TIMES FOUR, USING BILATERAL INTERNAL MAMMARY ARTERIES, RIGHT GREATER SAPHENOUS VEIN HARVESTED ENDOSCOPICALLY;  Surgeon: Elspeth JAYSON Millers, MD;  Location: [facility] OR;  Service: Open Heart Surgery;  Laterality: N/A;   ENDARTERECTOMY Right 01/22/2020   Procedure: ENDARTERECTOMY CAROTID;  Surgeon: Marea Selinda RAMAN, MD;  Location: [facility] ORS;  Service: Vascular;  Laterality: Right;   INGUINAL HERNIA REPAIR Right 04/16/2020   Procedure: HERNIA REPAIR INGUINAL ADULT;  Surgeon: Marolyn Nest, MD;  Location: [facility] ORS;  Service: General;  Laterality: Right;   RADIAL ARTERY HARVEST Left 07/26/2016   Procedure: RADIAL LEFT ARTERY HARVEST;  Surgeon: Elspeth JAYSON Millers, MD;  Location: [place] OR;  Service: Open Heart Surgery;  Laterality: Left;   TEE WITHOUT CARDIOVERSION N/A 07/26/2016   Procedure: TRANSESOPHAGEAL ECHOCARDIOGRAM (TEE);  Surgeon: Elspeth JAYSON Millers, MD;  Location: [facility] OR;  Service: Open Heart Surgery;  Laterality: N/A;    SOCIAL HISTORY: Social History   Socioeconomic History   Marital status: Married    Spouse name: Not on file   Number of children: Not on file   Years of education: Not on file   Highest education level: Not on file  Occupational History   Not on file  Tobacco Use   Smoking status: Never    Passive exposure:  Never   Smokeless tobacco: Never  Vaping Use   Vaping status: Never Used  Substance and Sexual Activity   Alcohol use: No   Drug use: No   Sexual activity: Yes  Other Topics Concern    Not on file  Social History Narrative   Not on file   Social Drivers of Health   Financial Resource Strain: Low Risk  (03/25/2024)   Received from [facility]   Overall Financial Resource Strain (CARDIA)    Difficulty of Paying Living Expenses: Not hard at all  Food Insecurity: Unknown (05/06/2024)   Hunger Vital Sign    Worried About Running Out of Food in the Last Year: Not on file    Ran Out of Food in the Last Year: Never true  Transportation Needs: No Transportation Needs (05/06/2024)   PRAPARE - Administrator, Civil Service (Medical): No    Lack of Transportation (Non-Medical): No  Physical Activity: Not on file  Stress: Not on file  Social Connections: Not on file  Intimate Partner Violence: Not At Risk (05/06/2024)   Humiliation, Afraid, Rape, and Kick questionnaire    Fear of Current or Ex-Partner: No    Emotionally Abused: No    Physically Abused: No    Sexually Abused: No    FAMILY HISTORY: Family History  Problem Relation Age of Onset   Diabetes Mother    Heart attack Father    CAD Brother    Thyroid cancer Brother     ALLERGIES:  has no known allergies.  MEDICATIONS:  Current Outpatient Medications  Medication Sig Dispense Refill   ASPIRIN  LOW DOSE 81 MG tablet TAKE 1 TABLET BY MOUTH EVERY DAY 90 tablet 3   clopidogrel  (PLAVIX ) 75 MG tablet TAKE 1 TABLET BY MOUTH EVERY DAY 90 tablet 1   cyclobenzaprine  (FLEXERIL ) 10 MG tablet TAKE 1 TABLET BY MOUTH AT BEDTIME FOR NECK SPASMS 30 tablet 2   empagliflozin  (JARDIANCE ) 25 MG TABS tablet Take 1 tablet (25 mg total) by mouth daily before breakfast. 30 tablet 1   fluticasone  (FLONASE ) 50 MCG/ACT nasal spray SPRAY 1 SPRAY INTO BOTH NOSTRILS DAILY. 48 mL 1   levothyroxine  (SYNTHROID ) 25 MCG tablet TAKE 1 TAB DAILY BEFORE BREAKFAST ON EMPTY STOMACH, WAIT 30 MINUTES AFTER TAKING MEDICATION TO EAT OR TAKE OTHER MEDICATIONS. 90 tablet 0   lisinopril  (ZESTRIL ) 20 MG tablet Take 1 tablet (20 mg total) by  mouth daily. 30 tablet 11   methocarbamol  (ROBAXIN ) 500 MG tablet Take 1-2 tablets (500-1,000 mg total) by mouth 4 (four) times daily. 120 tablet 3   metoprolol  succinate (TOPROL -XL) 25 MG 24 hr tablet TAKE 1/2 TABLET BY MOUTH EVERY DAY AS INSTRUCTED 45 tablet 0   pantoprazole  (PROTONIX ) 20 MG tablet TAKE 1 TABLET BY MOUTH EVERY DAY 90 tablet 1   rosuvastatin  (CRESTOR ) 40 MG tablet TAKE 1 TABLET BY MOUTH EVERY DAY 90 tablet 1   spironolactone (ALDACTONE) 25 MG tablet Take 25 mg by mouth daily.     tadalafil  (CIALIS ) 20 MG tablet Take 1 tablet (20 mg total) by mouth daily as needed for erectile dysfunction. 30 tablet 3   No current facility-administered medications for this visit.    PHYSICAL EXAMINATION:  Vitals:   05/12/24 1311  BP: 117/83  Pulse: (!) 56  Resp: 16  Temp: 98.4 F (36.9 C)  SpO2: 100%   Filed Weights   05/12/24 1311  Weight: 180 lb (81.6 kg)    Physical Exam Vitals and nursing  note reviewed.  HENT:     Head: Normocephalic and atraumatic.     Mouth/Throat:     Pharynx: Oropharynx is clear.   Eyes:     Extraocular Movements: Extraocular movements intact.     Pupils: Pupils are equal, round, and reactive to light.    Cardiovascular:     Rate and Rhythm: Normal rate and regular rhythm.  Pulmonary:     Comments: Decreased breath sounds bilaterally.  Abdominal:     Palpations: Abdomen is soft.   Musculoskeletal:        General: Normal range of motion.     Cervical back: Normal range of motion.   Skin:    General: Skin is warm.   Neurological:     General: No focal deficit present.     Mental Status: He is alert and oriented to person, place, and time.   Psychiatric:        Behavior: Behavior normal.        Judgment: Judgment normal.      LABORATORY DATA:  I have reviewed the data as listed Lab Results  Component Value Date   WBC 4.4 04/16/2024   HGB 14.3 04/16/2024   HCT 44.0 04/16/2024   MCV 95.2 04/16/2024   PLT 130 (L) 04/16/2024    Recent Labs    03/10/24 1032 04/16/24 1217  NA 139 136  K 3.9 4.0  CL 100 101  CO2 29 27  GLUCOSE 92 87  BUN 22 21*  CREATININE 1.39* 1.30*  CALCIUM  9.8 9.1  GFRNONAA  --  >60  PROT 7.6 7.7  ALBUMIN   --  4.1  AST 30 27  ALT 21 22  ALKPHOS  --  57  BILITOT 0.5 0.9    RADIOGRAPHIC STUDIES: I have personally reviewed the radiological images as listed and agreed with the findings in the report. No results found.  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 immature platelet fraction;- HIV/hepatitis- WNL. HOLD off bone marrow biopsy.   # I discussed that  ITP is usually treated with steroids or rituxan infusion or TPO agents.  Hold off any treatment at this time as patient is fairly asymptomatic.  CT scan FEb 2021- no splen/liver megaly or cirrhosis.  # Mild neutropenia ANC 1.6-normal white count.  Patient is asymptomatic.  Likely benign ethnic neutropenia.  Monitor for now.  # CAD- s/p CABG- [2017]; Dr.Khan - on asprin + plavix -   # Boderlien LOW B12 - # Low B12 : discussed with patient importance optimize B12-to help with blood/and nerves.  Recommend B12 sublingual 1000 mcg once a day [under the tongue; helps with better absorption].  Will Recheck b12, if not improved the plan B12 injections.   # DISPOSITION: # follow up in 6 month/JAN 2nd week- MD;  labs cbc/cmp; b12 levels-- Dr.B  All questions were answered. The patient knows to call the clinic with any problems, questions or concerns.     Cindy JONELLE Joe, MD 05/12/2024 1:44 PM

## 2024-05-12 NOTE — Patient Instructions (Signed)
#   Recommend B12 sublingual 1000 mcg once a day [under the tongue; helps with better absorption].

## 2024-06-03 ENCOUNTER — Telehealth: Payer: Self-pay | Admitting: Physician Assistant

## 2024-06-03 NOTE — Telephone Encounter (Signed)
 Received MR request from [employer]. Emailed $42.25 invoice for 72 pages; [email or web address]

## 2024-06-24 ENCOUNTER — Telehealth: Payer: Self-pay | Admitting: Physician Assistant

## 2024-06-24 NOTE — Telephone Encounter (Signed)
 MR emailed to [employer]; [email or web address]

## 2024-06-29 ENCOUNTER — Other Ambulatory Visit: Payer: Self-pay | Admitting: Family

## 2024-07-06 ENCOUNTER — Other Ambulatory Visit: Payer: Self-pay | Admitting: Physician Assistant

## 2024-07-06 DIAGNOSIS — T7840XD Allergy, unspecified, subsequent encounter: Secondary | ICD-10-CM

## 2024-07-16 ENCOUNTER — Other Ambulatory Visit: Payer: Self-pay | Admitting: Cardiovascular Disease

## 2024-07-16 DIAGNOSIS — I25118 Atherosclerotic heart disease of native coronary artery with other forms of angina pectoris: Secondary | ICD-10-CM

## 2024-08-04 ENCOUNTER — Ambulatory Visit: Admitting: Cardiovascular Disease

## 2024-08-04 ENCOUNTER — Encounter: Payer: Self-pay | Admitting: Cardiovascular Disease

## 2024-08-04 VITALS — BP 118/72 | HR 52 | Ht 66.0 in | Wt 183.8 lb

## 2024-08-04 DIAGNOSIS — R0602 Shortness of breath: Secondary | ICD-10-CM

## 2024-08-04 DIAGNOSIS — I5033 Acute on chronic diastolic (congestive) heart failure: Secondary | ICD-10-CM | POA: Diagnosis not present

## 2024-08-04 DIAGNOSIS — Z951 Presence of aortocoronary bypass graft: Secondary | ICD-10-CM

## 2024-08-04 DIAGNOSIS — I25118 Atherosclerotic heart disease of native coronary artery with other forms of angina pectoris: Secondary | ICD-10-CM

## 2024-08-04 DIAGNOSIS — I10 Essential (primary) hypertension: Secondary | ICD-10-CM | POA: Diagnosis not present

## 2024-08-04 MED ORDER — FUROSEMIDE 20 MG PO TABS: 20.0000 mg | ORAL_TABLET | Freq: Every day | ORAL | 11 refills | Status: AC

## 2024-08-04 NOTE — Progress Notes (Signed)
 Cardiology Office Note   Date:  08/04/2024   ID:  Nicholas Torres, DOB 1961/02/15, MRN [id number]  PCP:  Nicholas Tinnie POUR, PA-C  Cardiologist:  Denyse Bathe, MD      History of Present Illness: Nicholas Torres is a 63 y.o. male who presents for  Chief Complaint  Patient presents with   Follow-up    3 month follow up    Has SOB, runny nose.      Past Medical History:  Diagnosis Date   Arthritis    back   CAD (coronary artery disease) 2017   CHF (congestive heart failure) (HCC)    High cholesterol    Hypertension    Myocardial infarction [facility]) 2017   Pollen allergy      Past Surgical History:  Procedure Laterality Date   CARDIAC CATHETERIZATION Right 07/24/2016   Procedure: Left Heart Cath and Coronary Angiography;  Surgeon: Denyse DELENA Bathe, MD;  Location: [facility] INVASIVE CV LAB;  Service: Cardiovascular;  Laterality: Right;   COLONOSCOPY WITH PROPOFOL  N/A 10/08/2023   Procedure: COLONOSCOPY WITH PROPOFOL ;  Surgeon: Unk Corinn Skiff, MD;  Location: [facility] ENDOSCOPY;  Service: Gastroenterology;  Laterality: N/A;   CORONARY ARTERY BYPASS GRAFT N/A 07/26/2016   Procedure: CORONARY ARTERY BYPASS GRAFTING (CABG), ON PUMP, TIMES FOUR, USING BILATERAL INTERNAL MAMMARY ARTERIES, RIGHT GREATER SAPHENOUS VEIN HARVESTED ENDOSCOPICALLY;  Surgeon: Elspeth JAYSON Millers, MD;  Location: [facility] OR;  Service: Open Heart Surgery;  Laterality: N/A;   ENDARTERECTOMY Right 01/22/2020   Procedure: ENDARTERECTOMY CAROTID;  Surgeon: Marea Selinda RAMAN, MD;  Location: [facility] ORS;  Service: Vascular;  Laterality: Right;   INGUINAL HERNIA REPAIR Right 04/16/2020   Procedure: HERNIA REPAIR INGUINAL ADULT;  Surgeon: Marolyn Nest, MD;  Location: [facility] ORS;  Service: General;  Laterality: Right;   RADIAL ARTERY HARVEST Left 07/26/2016   Procedure: RADIAL LEFT ARTERY HARVEST;  Surgeon: Elspeth JAYSON Millers, MD;  Location: [facility] OR;  Service: Open Heart Surgery;  Laterality: Left;   TEE WITHOUT CARDIOVERSION N/A  07/26/2016   Procedure: TRANSESOPHAGEAL ECHOCARDIOGRAM (TEE);  Surgeon: Elspeth JAYSON Millers, MD;  Location: [facility] OR;  Service: Open Heart Surgery;  Laterality: N/A;     Current Outpatient Medications  Medication Sig Dispense Refill   ASPIRIN  LOW DOSE 81 MG tablet TAKE 1 TABLET BY MOUTH EVERY DAY 90 tablet 3   clopidogrel  (PLAVIX ) 75 MG tablet TAKE 1 TABLET BY MOUTH EVERY DAY 90 tablet 1   cyclobenzaprine  (FLEXERIL ) 10 MG tablet TAKE 1 TABLET BY MOUTH AT BEDTIME FOR NECK SPASMS 30 tablet 2   fluticasone  (FLONASE ) 50 MCG/ACT nasal spray SPRAY 1 SPRAY INTO BOTH NOSTRILS DAILY. 48 mL 1   furosemide  (LASIX ) 20 MG tablet Take 1 tablet (20 mg total) by mouth daily. 30 tablet 11   JARDIANCE  25 MG TABS tablet TAKE 1 TABLET BY MOUTH DAILY BEFORE BREAKFAST. 30 tablet 1   levothyroxine  (SYNTHROID ) 25 MCG tablet TAKE 1 TAB DAILY BEFORE BREAKFAST ON EMPTY STOMACH, WAIT 30 MINUTES AFTER TAKING MEDICATION TO EAT OR TAKE OTHER MEDICATIONS. 90 tablet 0   lisinopril  (ZESTRIL ) 20 MG tablet Take 1 tablet (20 mg total) by mouth daily. 30 tablet 11   methocarbamol  (ROBAXIN ) 500 MG tablet Take 1-2 tablets (500-1,000 mg total) by mouth 4 (four) times daily. (Patient taking differently: Take 500-1,000 mg by mouth as needed for muscle spasms.) 120 tablet 3   metoprolol  succinate (TOPROL -XL) 25 MG 24 hr tablet TAKE 1/2 TABLET BY MOUTH EVERY DAY AS INSTRUCTED 45 tablet  0   pantoprazole  (PROTONIX ) 20 MG tablet TAKE 1 TABLET BY MOUTH EVERY DAY 90 tablet 1   rosuvastatin  (CRESTOR ) 40 MG tablet TAKE 1 TABLET BY MOUTH EVERY DAY 90 tablet 1   spironolactone (ALDACTONE) 25 MG tablet Take 25 mg by mouth daily.     tadalafil  (CIALIS ) 20 MG tablet Take 1 tablet (20 mg total) by mouth daily as needed for erectile dysfunction. 30 tablet 3   No current facility-administered medications for this visit.    Allergies:   Patient has no known allergies.    Social History:   reports that he has never smoked. He has never been exposed to  tobacco smoke. He has never used smokeless tobacco. He reports that he does not drink alcohol and does not use drugs.   Family History:  family history includes CAD in his brother; Diabetes in his mother; Heart attack in his father; Thyroid cancer in his brother.    ROS:     ROS    All other systems are reviewed and negative.    PHYSICAL EXAM: VS:  BP 118/72   Pulse (!) 52   Ht 5' 6 (1.676 m)   Wt 183 lb 12.8 oz (83.4 kg)   SpO2 96%   BMI 29.67 kg/m  , BMI Body mass index is 29.67 kg/m. Last weight:  Wt Readings from Last 3 Encounters:  08/04/24 183 lb 12.8 oz (83.4 kg)  05/12/24 180 lb (81.6 kg)  05/01/24 184 lb (83.5 kg)     Physical Exam    EKG:   Recent Labs: 04/16/2024: ALT 22; BUN 21; Creatinine, Ser 1.30; Hemoglobin 14.3; Platelets 130; Potassium 4.0; Sodium 136    Lipid Panel    Component Value Date/Time   CHOL 174 03/10/2024 1032   TRIG 65 03/10/2024 1032   HDL 58 03/10/2024 1032   CHOLHDL 3.0 03/10/2024 1032   VLDL 17 07/24/2016 0612   LDLCALC 101 (H) 03/10/2024 1032      Other studies Reviewed: Additional studies/ records that were reviewed today include:  Review of the above records demonstrates:       No data to display            ASSESSMENT AND PLAN:    ICD-10-CM   1. SOB (shortness of breath)  R06.02 PCV ECHOCARDIOGRAM COMPLETE    furosemide  (LASIX ) 20 MG tablet    2. CHF (congestive heart failure), NYHA class III, acute on chronic, diastolic (HCC)  I50.33 PCV ECHOCARDIOGRAM COMPLETE    furosemide  (LASIX ) 20 MG tablet   has orthopea, and SOB. Add lasix  20 daily    3. Essential hypertension  I10 PCV ECHOCARDIOGRAM COMPLETE    furosemide  (LASIX ) 20 MG tablet    4. S/P CABG x 4  Z95.1 PCV ECHOCARDIOGRAM COMPLETE    furosemide  (LASIX ) 20 MG tablet    5. Coronary artery disease of native artery of native heart with stable angina pectoris (HCC)  I25.118 PCV ECHOCARDIOGRAM COMPLETE    furosemide  (LASIX ) 20 MG tablet        Problem List Items Addressed This Visit       Cardiovascular and Mediastinum   CAD (coronary artery disease)   Relevant Medications   furosemide  (LASIX ) 20 MG tablet   Other Relevant Orders   PCV ECHOCARDIOGRAM COMPLETE   Essential hypertension   Relevant Medications   furosemide  (LASIX ) 20 MG tablet   Other Relevant Orders   PCV ECHOCARDIOGRAM COMPLETE     Other   S/P CABG x  4   Relevant Medications   furosemide  (LASIX ) 20 MG tablet   Other Relevant Orders   PCV ECHOCARDIOGRAM COMPLETE   SOB (shortness of breath) - Primary   Relevant Medications   furosemide  (LASIX ) 20 MG tablet   Other Relevant Orders   PCV ECHOCARDIOGRAM COMPLETE   Other Visit Diagnoses       CHF (congestive heart failure), NYHA class III, acute on chronic, diastolic (HCC)       has orthopea, and SOB. Add lasix  20 daily   Relevant Medications   furosemide  (LASIX ) 20 MG tablet   Other Relevant Orders   PCV ECHOCARDIOGRAM COMPLETE          Disposition:   Return in about 4 weeks (around 09/01/2024) for echo and f/u.    Total time spent: 40 minutes  Signed,  Denyse Bathe, MD  08/04/2024 11:02 AM    [facility]

## 2024-08-14 ENCOUNTER — Ambulatory Visit (INDEPENDENT_AMBULATORY_CARE_PROVIDER_SITE_OTHER)

## 2024-08-14 DIAGNOSIS — R0602 Shortness of breath: Secondary | ICD-10-CM

## 2024-08-14 DIAGNOSIS — I351 Nonrheumatic aortic (valve) insufficiency: Secondary | ICD-10-CM | POA: Diagnosis not present

## 2024-08-14 DIAGNOSIS — I5033 Acute on chronic diastolic (congestive) heart failure: Secondary | ICD-10-CM

## 2024-08-14 DIAGNOSIS — I361 Nonrheumatic tricuspid (valve) insufficiency: Secondary | ICD-10-CM | POA: Diagnosis not present

## 2024-08-14 DIAGNOSIS — I25118 Atherosclerotic heart disease of native coronary artery with other forms of angina pectoris: Secondary | ICD-10-CM

## 2024-08-14 DIAGNOSIS — I340 Nonrheumatic mitral (valve) insufficiency: Secondary | ICD-10-CM

## 2024-08-14 DIAGNOSIS — Z951 Presence of aortocoronary bypass graft: Secondary | ICD-10-CM

## 2024-08-14 DIAGNOSIS — I10 Essential (primary) hypertension: Secondary | ICD-10-CM

## 2024-08-18 ENCOUNTER — Encounter: Payer: Self-pay | Admitting: Cardiovascular Disease

## 2024-08-18 ENCOUNTER — Ambulatory Visit (INDEPENDENT_AMBULATORY_CARE_PROVIDER_SITE_OTHER): Admitting: Cardiovascular Disease

## 2024-08-18 VITALS — BP 112/72 | HR 58 | Ht 66.0 in | Wt 183.2 lb

## 2024-08-18 DIAGNOSIS — I10 Essential (primary) hypertension: Secondary | ICD-10-CM | POA: Diagnosis not present

## 2024-08-18 DIAGNOSIS — I6521 Occlusion and stenosis of right carotid artery: Secondary | ICD-10-CM

## 2024-08-18 DIAGNOSIS — Z951 Presence of aortocoronary bypass graft: Secondary | ICD-10-CM

## 2024-08-18 DIAGNOSIS — I214 Non-ST elevation (NSTEMI) myocardial infarction: Secondary | ICD-10-CM | POA: Diagnosis not present

## 2024-08-18 DIAGNOSIS — I2583 Coronary atherosclerosis due to lipid rich plaque: Secondary | ICD-10-CM

## 2024-08-18 DIAGNOSIS — E782 Mixed hyperlipidemia: Secondary | ICD-10-CM

## 2024-08-18 DIAGNOSIS — R0602 Shortness of breath: Secondary | ICD-10-CM

## 2024-08-18 DIAGNOSIS — Z024 Encounter for examination for driving license: Secondary | ICD-10-CM

## 2024-08-18 DIAGNOSIS — I2510 Atherosclerotic heart disease of native coronary artery without angina pectoris: Secondary | ICD-10-CM

## 2024-08-18 DIAGNOSIS — I5033 Acute on chronic diastolic (congestive) heart failure: Secondary | ICD-10-CM

## 2024-08-18 NOTE — Progress Notes (Signed)
 Cardiology Office Note   Date:  08/18/2024   ID:  Nicholas Torres, DOB 1961-04-18, MRN [id number]  PCP:  Kristina Tinnie POUR, PA-C  Cardiologist:  Denyse Bathe, MD      History of Present Illness: Nicholas Torres is a 63 y.o. male who presents for  Chief Complaint  Patient presents with   Follow-up    4 week echo results    No chest pain or dizziness. But has SOB  occasionally.      Past Medical History:  Diagnosis Date   Arthritis    back   CAD (coronary artery disease) 2017   CHF (congestive heart failure) (HCC)    High cholesterol    Hypertension    Myocardial infarction [facility]) 2017   Pollen allergy      Past Surgical History:  Procedure Laterality Date   CARDIAC CATHETERIZATION Right 07/24/2016   Procedure: Left Heart Cath and Coronary Angiography;  Surgeon: Denyse DELENA Bathe, MD;  Location: [facility] INVASIVE CV LAB;  Service: Cardiovascular;  Laterality: Right;   COLONOSCOPY WITH PROPOFOL  N/A 10/08/2023   Procedure: COLONOSCOPY WITH PROPOFOL ;  Surgeon: Unk Corinn Skiff, MD;  Location: [facility] ENDOSCOPY;  Service: Gastroenterology;  Laterality: N/A;   CORONARY ARTERY BYPASS GRAFT N/A 07/26/2016   Procedure: CORONARY ARTERY BYPASS GRAFTING (CABG), ON PUMP, TIMES FOUR, USING BILATERAL INTERNAL MAMMARY ARTERIES, RIGHT GREATER SAPHENOUS VEIN HARVESTED ENDOSCOPICALLY;  Surgeon: Elspeth JAYSON Millers, MD;  Location: [facility] OR;  Service: Open Heart Surgery;  Laterality: N/A;   ENDARTERECTOMY Right 01/22/2020   Procedure: ENDARTERECTOMY CAROTID;  Surgeon: Marea Selinda RAMAN, MD;  Location: [facility] ORS;  Service: Vascular;  Laterality: Right;   INGUINAL HERNIA REPAIR Right 04/16/2020   Procedure: HERNIA REPAIR INGUINAL ADULT;  Surgeon: Marolyn Nest, MD;  Location: [facility] ORS;  Service: General;  Laterality: Right;   RADIAL ARTERY HARVEST Left 07/26/2016   Procedure: RADIAL LEFT ARTERY HARVEST;  Surgeon: Elspeth JAYSON Millers, MD;  Location: [facility] OR;  Service: Open Heart Surgery;  Laterality:  Left;   TEE WITHOUT CARDIOVERSION N/A 07/26/2016   Procedure: TRANSESOPHAGEAL ECHOCARDIOGRAM (TEE);  Surgeon: Elspeth JAYSON Millers, MD;  Location: [facility] OR;  Service: Open Heart Surgery;  Laterality: N/A;     Current Outpatient Medications  Medication Sig Dispense Refill   ASPIRIN  LOW DOSE 81 MG tablet TAKE 1 TABLET BY MOUTH EVERY DAY 90 tablet 3   clopidogrel  (PLAVIX ) 75 MG tablet TAKE 1 TABLET BY MOUTH EVERY DAY 90 tablet 1   cyclobenzaprine  (FLEXERIL ) 10 MG tablet TAKE 1 TABLET BY MOUTH AT BEDTIME FOR NECK SPASMS 30 tablet 2   fluticasone  (FLONASE ) 50 MCG/ACT nasal spray SPRAY 1 SPRAY INTO BOTH NOSTRILS DAILY. 48 mL 1   furosemide  (LASIX ) 20 MG tablet Take 1 tablet (20 mg total) by mouth daily. 30 tablet 11   JARDIANCE  25 MG TABS tablet TAKE 1 TABLET BY MOUTH DAILY BEFORE BREAKFAST. 30 tablet 1   levothyroxine  (SYNTHROID ) 25 MCG tablet TAKE 1 TAB DAILY BEFORE BREAKFAST ON EMPTY STOMACH, WAIT 30 MINUTES AFTER TAKING MEDICATION TO EAT OR TAKE OTHER MEDICATIONS. 90 tablet 0   lisinopril  (ZESTRIL ) 20 MG tablet Take 1 tablet (20 mg total) by mouth daily. 30 tablet 11   methocarbamol  (ROBAXIN ) 500 MG tablet Take 1-2 tablets (500-1,000 mg total) by mouth 4 (four) times daily. (Patient taking differently: Take 500-1,000 mg by mouth as needed for muscle spasms.) 120 tablet 3   metoprolol  succinate (TOPROL -XL) 25 MG 24 hr tablet TAKE 1/2 TABLET BY MOUTH  EVERY DAY AS INSTRUCTED 45 tablet 0   pantoprazole  (PROTONIX ) 20 MG tablet TAKE 1 TABLET BY MOUTH EVERY DAY 90 tablet 1   rosuvastatin  (CRESTOR ) 40 MG tablet TAKE 1 TABLET BY MOUTH EVERY DAY 90 tablet 1   spironolactone (ALDACTONE) 25 MG tablet Take 25 mg by mouth daily.     tadalafil  (CIALIS ) 20 MG tablet Take 1 tablet (20 mg total) by mouth daily as needed for erectile dysfunction. 30 tablet 3   No current facility-administered medications for this visit.    Allergies:   Patient has no known allergies.    Social History:   reports that he has  never smoked. He has never been exposed to tobacco smoke. He has never used smokeless tobacco. He reports that he does not drink alcohol and does not use drugs.   Family History:  family history includes CAD in his brother; Diabetes in his mother; Heart attack in his father; Thyroid cancer in his brother.    ROS:     Review of Systems  Constitutional: Negative.   HENT: Negative.    Eyes: Negative.   Respiratory: Negative.    Gastrointestinal: Negative.   Genitourinary: Negative.   Musculoskeletal: Negative.   Skin: Negative.   Neurological: Negative.   Endo/Heme/Allergies: Negative.   Psychiatric/Behavioral: Negative.    All other systems reviewed and are negative.     All other systems are reviewed and negative.    PHYSICAL EXAM: VS:  BP 112/72   Pulse (!) 58   Ht 5' 6 (1.676 m)   Wt 183 lb 3.2 oz (83.1 kg)   SpO2 97%   BMI 29.57 kg/m  , BMI Body mass index is 29.57 kg/m. Last weight:  Wt Readings from Last 3 Encounters:  08/18/24 183 lb 3.2 oz (83.1 kg)  08/04/24 183 lb 12.8 oz (83.4 kg)  05/12/24 180 lb (81.6 kg)     Physical Exam Vitals reviewed.  Constitutional:      Appearance: Normal appearance. He is normal weight.  HENT:     Head: Normocephalic.     Nose: Nose normal.     Mouth/Throat:     Mouth: Mucous membranes are moist.  Eyes:     Pupils: Pupils are equal, round, and reactive to light.  Cardiovascular:     Rate and Rhythm: Normal rate and regular rhythm.     Pulses: Normal pulses.     Heart sounds: Normal heart sounds.  Pulmonary:     Effort: Pulmonary effort is normal.  Abdominal:     General: Abdomen is flat. Bowel sounds are normal.  Musculoskeletal:        General: Normal range of motion.     Cervical back: Normal range of motion.  Skin:    General: Skin is warm.  Neurological:     General: No focal deficit present.     Mental Status: He is alert.  Psychiatric:        Mood and Affect: Mood normal.       EKG:   Recent  Labs: 04/16/2024: ALT 22; BUN 21; Creatinine, Ser 1.30; Hemoglobin 14.3; Platelets 130; Potassium 4.0; Sodium 136    Lipid Panel    Component Value Date/Time   CHOL 174 03/10/2024 1032   TRIG 65 03/10/2024 1032   HDL 58 03/10/2024 1032   CHOLHDL 3.0 03/10/2024 1032   VLDL 17 07/24/2016 0612   LDLCALC 101 (H) 03/10/2024 1032      Other studies Reviewed: Additional studies/ records that were  reviewed today include:  Review of the above records demonstrates:       No data to display            ASSESSMENT AND PLAN:    ICD-10-CM   1. Coronary artery disease due to lipid rich plaque  I25.10    I25.83     2. Stenosis of right carotid artery  I65.21     3. Essential hypertension  I10     4. NSTEMI (non-ST elevated myocardial infarction) (HCC)  I21.4     5. S/P CABG x 4  Z95.1     6. SOB (shortness of breath)  R06.02    Being treated with lasix , aldactone.jaurdiance.    7. Mixed hyperlipidemia  E78.2     8. CHF (congestive heart failure), NYHA class III, acute on chronic, diastolic (HCC)  I50.33    On jaurdiance and aldactone, and not SOB. take extra dose of lasix  when have orthopnea.    9. Driver's permit PE (physical examination)  Z02.4    ECHO unchanged, normal LVEF, mild MR /AR. Cleared for DOT physical.       Problem List Items Addressed This Visit       Cardiovascular and Mediastinum   NSTEMI (non-ST elevated myocardial infarction) (HCC)   CAD (coronary artery disease) - Primary   Essential hypertension   Carotid stenosis     Other   S/P CABG x 4   Mixed hyperlipidemia   SOB (shortness of breath)   Other Visit Diagnoses       CHF (congestive heart failure), NYHA class III, acute on chronic, diastolic (HCC)       On jaurdiance and aldactone, and not SOB. take extra dose of lasix  when have orthopnea.     Driver's permit PE (physical examination)       ECHO unchanged, normal LVEF, mild MR /AR. Cleared for DOT physical.          Disposition:    Return in about 3 months (around 11/18/2024) for Print todays note which has cleance for DOT, and f/u.    Total time spent: 30 minutes  Signed,  Denyse Bathe, MD  08/18/2024 10:35 AM    [facility]

## 2024-08-26 DIAGNOSIS — M5451 Vertebrogenic low back pain: Secondary | ICD-10-CM | POA: Insufficient documentation

## 2024-09-04 ENCOUNTER — Encounter: Payer: Self-pay | Admitting: Physician Assistant

## 2024-09-04 ENCOUNTER — Other Ambulatory Visit: Payer: Self-pay | Admitting: Physician Assistant

## 2024-09-04 ENCOUNTER — Ambulatory Visit (INDEPENDENT_AMBULATORY_CARE_PROVIDER_SITE_OTHER): Admitting: Physician Assistant

## 2024-09-04 VITALS — BP 127/79 | HR 64 | Temp 98.3°F | Resp 16 | Ht 66.0 in | Wt 186.6 lb

## 2024-09-04 DIAGNOSIS — D696 Thrombocytopenia, unspecified: Secondary | ICD-10-CM | POA: Diagnosis not present

## 2024-09-04 DIAGNOSIS — I10 Essential (primary) hypertension: Secondary | ICD-10-CM | POA: Diagnosis not present

## 2024-09-04 DIAGNOSIS — D709 Neutropenia, unspecified: Secondary | ICD-10-CM | POA: Diagnosis not present

## 2024-09-04 DIAGNOSIS — R7303 Prediabetes: Secondary | ICD-10-CM | POA: Diagnosis not present

## 2024-09-04 LAB — POCT GLYCOSYLATED HEMOGLOBIN (HGB A1C): Hemoglobin A1C: 5.9 % — AB (ref 4.0–5.6)

## 2024-09-04 NOTE — Progress Notes (Signed)
 [facility] [address] [place], [place] [address]  Internal MEDICINE  Office Visit Note  Patient Name: Nicholas Torres  [id number]  [id number]  Date of Service: 09/04/2024  Chief Complaint  Patient presents with   Follow-up   Hypertension   Hyperlipidemia   Quality Metric Gaps    Pneumonia vaccine    HPI Pt is here for routine follow up -Followed by cardiology, reports he started furosemide  due to some SOB -Also saw hematology who is monitoring labs. States he goes back in Jan -seeing vascular soon, always feels like he can feel something along scar from previous carotid surgery and thinks it must be scar tissue, but will discuss with vascular too -going to check about PNA vaccine, and possible flu shot with this  Current Medication: Outpatient Encounter Medications as of 09/04/2024  Medication Sig   ASPIRIN  LOW DOSE 81 MG tablet TAKE 1 TABLET BY MOUTH EVERY DAY   clopidogrel  (PLAVIX ) 75 MG tablet TAKE 1 TABLET BY MOUTH EVERY DAY   cyclobenzaprine  (FLEXERIL ) 10 MG tablet TAKE 1 TABLET BY MOUTH AT BEDTIME FOR NECK SPASMS   fluticasone  (FLONASE ) 50 MCG/ACT nasal spray SPRAY 1 SPRAY INTO BOTH NOSTRILS DAILY.   furosemide  (LASIX ) 20 MG tablet Take 1 tablet (20 mg total) by mouth daily.   JARDIANCE  25 MG TABS tablet TAKE 1 TABLET BY MOUTH DAILY BEFORE BREAKFAST.   levothyroxine  (SYNTHROID ) 25 MCG tablet TAKE 1 TAB DAILY BEFORE BREAKFAST ON EMPTY STOMACH, WAIT 30 MINUTES AFTER TAKING MEDICATION TO EAT OR TAKE OTHER MEDICATIONS.   lisinopril  (ZESTRIL ) 20 MG tablet Take 1 tablet (20 mg total) by mouth daily.   methocarbamol  (ROBAXIN ) 500 MG tablet Take 1-2 tablets (500-1,000 mg total) by mouth 4 (four) times daily. (Patient taking differently: Take 500-1,000 mg by mouth as needed for muscle spasms.)   metoprolol  succinate (TOPROL -XL) 25 MG 24 hr tablet TAKE 1/2 TABLET BY MOUTH EVERY DAY AS INSTRUCTED   rosuvastatin  (CRESTOR ) 40 MG tablet TAKE 1 TABLET BY MOUTH EVERY DAY    spironolactone (ALDACTONE) 25 MG tablet Take 25 mg by mouth daily.   tadalafil  (CIALIS ) 20 MG tablet Take 1 tablet (20 mg total) by mouth daily as needed for erectile dysfunction.   [DISCONTINUED] pantoprazole  (PROTONIX ) 20 MG tablet TAKE 1 TABLET BY MOUTH EVERY DAY   No facility-administered encounter medications on file as of 09/04/2024.    Surgical History: Past Surgical History:  Procedure Laterality Date   CARDIAC CATHETERIZATION Right 07/24/2016   Procedure: Left Heart Cath and Coronary Angiography;  Surgeon: Denyse DELENA Bathe, MD;  Location: [facility] INVASIVE CV LAB;  Service: Cardiovascular;  Laterality: Right;   COLONOSCOPY WITH PROPOFOL  N/A 10/08/2023   Procedure: COLONOSCOPY WITH PROPOFOL ;  Surgeon: Unk Corinn Skiff, MD;  Location: [facility] ENDOSCOPY;  Service: Gastroenterology;  Laterality: N/A;   CORONARY ARTERY BYPASS GRAFT N/A 07/26/2016   Procedure: CORONARY ARTERY BYPASS GRAFTING (CABG), ON PUMP, TIMES FOUR, USING BILATERAL INTERNAL MAMMARY ARTERIES, RIGHT GREATER SAPHENOUS VEIN HARVESTED ENDOSCOPICALLY;  Surgeon: Elspeth JAYSON Millers, MD;  Location: [facility] OR;  Service: Open Heart Surgery;  Laterality: N/A;   ENDARTERECTOMY Right 01/22/2020   Procedure: ENDARTERECTOMY CAROTID;  Surgeon: Marea Selinda RAMAN, MD;  Location: [facility] ORS;  Service: Vascular;  Laterality: Right;   INGUINAL HERNIA REPAIR Right 04/16/2020   Procedure: HERNIA REPAIR INGUINAL ADULT;  Surgeon: Marolyn Nest, MD;  Location: [facility] ORS;  Service: General;  Laterality: Right;   RADIAL ARTERY HARVEST Left 07/26/2016   Procedure: RADIAL LEFT ARTERY HARVEST;  Surgeon: Elspeth JAYSON Millers, MD;  Location: [facility] OR;  Service: Open Heart Surgery;  Laterality: Left;   TEE WITHOUT CARDIOVERSION N/A 07/26/2016   Procedure: TRANSESOPHAGEAL ECHOCARDIOGRAM (TEE);  Surgeon: Elspeth JAYSON Millers, MD;  Location: [facility] OR;  Service: Open Heart Surgery;  Laterality: N/A;    Medical History: Past Medical History:  Diagnosis Date   Arthritis     back   CAD (coronary artery disease) 2017   CHF (congestive heart failure) (HCC)    High cholesterol    Hypertension    Myocardial infarction (HCC) 2017   Pollen allergy     Family History: Family History  Problem Relation Age of Onset   Diabetes Mother    Heart attack Father    CAD Brother    Thyroid cancer Brother     Social History   Socioeconomic History   Marital status: Married    Spouse name: Not on file   Number of children: Not on file   Years of education: Not on file   Highest education level: Not on file  Occupational History   Not on file  Tobacco Use   Smoking status: Never    Passive exposure: Never   Smokeless tobacco: Never  Vaping Use   Vaping status: Never Used  Substance and Sexual Activity   Alcohol use: No   Drug use: No   Sexual activity: Yes  Other Topics Concern   Not on file  Social History Narrative   Not on file   Social Drivers of Health   Financial Resource Strain: Low Risk  (03/25/2024)   Received from [facility]   Overall Financial Resource Strain (CARDIA)    Difficulty of Paying Living Expenses: Not hard at all  Food Insecurity: Unknown (05/06/2024)   Hunger Vital Sign    Worried About Running Out of Food in the Last Year: Not on file    Ran Out of Food in the Last Year: Never true  Transportation Needs: No Transportation Needs (05/06/2024)   PRAPARE - Administrator, Civil Service (Medical): No    Lack of Transportation (Non-Medical): No  Physical Activity: Not on file  Stress: Not on file  Social Connections: Not on file  Intimate Partner Violence: Not At Risk (05/06/2024)   Humiliation, Afraid, Rape, and Kick questionnaire    Fear of Current or Ex-Partner: No    Emotionally Abused: No    Physically Abused: No    Sexually Abused: No      Review of Systems  Constitutional:  Negative for chills, fatigue and unexpected weight change.  HENT:  Negative for postnasal drip, sneezing and sore throat.   Eyes:   Negative for redness.  Respiratory:  Negative for chest tightness, shortness of breath and wheezing.   Cardiovascular:  Negative for chest pain and palpitations.  Gastrointestinal:  Negative for abdominal pain, constipation, diarrhea, nausea and vomiting.  Genitourinary:  Negative for dysuria and frequency.  Musculoskeletal:  Negative for joint swelling.  Skin:  Negative for rash.  Neurological:  Negative for tremors.  Hematological:  Negative for adenopathy. Does not bruise/bleed easily.  Psychiatric/Behavioral:  Negative for behavioral problems (Depression) and suicidal ideas. The patient is not nervous/anxious.     Vital Signs: BP 127/79   Pulse 64   Temp 98.3 F (36.8 C)   Resp 16   Ht 5' 6 (1.676 m)   Wt 186 lb 9.6 oz (84.6 kg)   SpO2 99%   BMI 30.12 kg/m  Physical Exam Vitals and nursing note reviewed.  Constitutional:      General: He is not in acute distress.    Appearance: Normal appearance. He is well-developed. He is obese. He is not diaphoretic.  HENT:     Head: Normocephalic and atraumatic.  Eyes:     Extraocular Movements: Extraocular movements intact.  Neck:     Thyroid: No thyromegaly.     Vascular: No JVD.     Trachea: No tracheal deviation.  Cardiovascular:     Rate and Rhythm: Normal rate and regular rhythm.     Heart sounds: Normal heart sounds. No murmur heard.    No friction rub. No gallop.  Pulmonary:     Effort: Pulmonary effort is normal. No respiratory distress.     Breath sounds: No wheezing or rales.  Chest:     Chest wall: No tenderness.  Musculoskeletal:        General: Normal range of motion.  Skin:    General: Skin is warm and dry.  Neurological:     Mental Status: He is alert and oriented to person, place, and time.     Cranial Nerves: No cranial nerve deficit.  Psychiatric:        Behavior: Behavior normal.        Thought Content: Thought content normal.        Judgment: Judgment normal.        Assessment/Plan: 1.  Prediabetes (Primary) - POCT HgB A1C is 5.9 which is improved from 6.4 last check. Continue to work on diet and exercise. Patient also taking Jardiance  for cardiac benefits which also helps BG.   2. Essential hypertension Stable, continue current medications  3. Low platelet count Followed by hematology  4. Neutropenia, unspecified type Followed by hematology   General Counseling: Randal verbalizes understanding of the findings of todays visit and agrees with plan of treatment. I have discussed any further diagnostic evaluation that may be needed or ordered today. We also reviewed his medications today. he has been encouraged to call the office with any questions or concerns that should arise related to todays visit.    Orders Placed This Encounter  Procedures   POCT HgB A1C    No orders of the defined types were placed in this encounter.   This patient was seen by Tinnie Pro, PA-C in collaboration with Dr. Sigrid Bathe as a part of collaborative care agreement.   Total time spent:30 Minutes Time spent includes review of chart, medications, test results, and follow up plan with the patient.      Dr Fozia M Khan Internal medicine

## 2024-09-11 ENCOUNTER — Other Ambulatory Visit (INDEPENDENT_AMBULATORY_CARE_PROVIDER_SITE_OTHER): Payer: Self-pay | Admitting: Vascular Surgery

## 2024-09-11 ENCOUNTER — Telehealth: Payer: Self-pay | Admitting: Physician Assistant

## 2024-09-11 DIAGNOSIS — I6521 Occlusion and stenosis of right carotid artery: Secondary | ICD-10-CM

## 2024-09-11 NOTE — Telephone Encounter (Signed)
 Received MR request from [facility]. Requesting all records from 09/28/2009-current. Faxed invoice for 294 pages in the amount of $104.75-Toni

## 2024-09-16 ENCOUNTER — Encounter (INDEPENDENT_AMBULATORY_CARE_PROVIDER_SITE_OTHER): Payer: Self-pay | Admitting: Vascular Surgery

## 2024-09-16 ENCOUNTER — Ambulatory Visit (INDEPENDENT_AMBULATORY_CARE_PROVIDER_SITE_OTHER): Payer: BC Managed Care – PPO

## 2024-09-16 ENCOUNTER — Ambulatory Visit (INDEPENDENT_AMBULATORY_CARE_PROVIDER_SITE_OTHER): Payer: BC Managed Care – PPO | Admitting: Vascular Surgery

## 2024-09-16 VITALS — BP 123/83 | HR 56 | Resp 18 | Ht 66.0 in | Wt 183.0 lb

## 2024-09-16 DIAGNOSIS — I6521 Occlusion and stenosis of right carotid artery: Secondary | ICD-10-CM

## 2024-09-16 DIAGNOSIS — I10 Essential (primary) hypertension: Secondary | ICD-10-CM

## 2024-09-16 DIAGNOSIS — E782 Mixed hyperlipidemia: Secondary | ICD-10-CM

## 2024-09-16 NOTE — Assessment & Plan Note (Signed)
 His carotid duplex today shows a widely patent right carotid endarterectomy and mild stenosis in the 1 to 39% range on the left side unchanged from previous studies.  Continue taking antiplatelets and statin agent.  Continue to follow on an annual basis.

## 2024-09-16 NOTE — Progress Notes (Signed)
 MRN : [id number]  Nicholas Torres is a 63 y.o. (25-Nov-1960) male who presents with chief complaint of  Chief Complaint  Patient presents with   Follow-up    1 year follow up carotid  .  History of Present Illness: Patient returns today in follow up of his carotid disease.  He still complains about tightness and pain in his neck at times.  He is concerned this could be a stroke forming or some sort of recurrent disease.  He is now about 5 years status post right carotid endarterectomy.  He was complaining of leg pain and had a negative PAD workup earlier this year.  His carotid duplex today shows a widely patent right carotid endarterectomy and mild stenosis in the 1 to 39% range on the left side unchanged from previous studies.  Current Outpatient Medications  Medication Sig Dispense Refill   ASPIRIN  LOW DOSE 81 MG tablet TAKE 1 TABLET BY MOUTH EVERY DAY 90 tablet 3   clopidogrel  (PLAVIX ) 75 MG tablet TAKE 1 TABLET BY MOUTH EVERY DAY 90 tablet 1   cyclobenzaprine  (FLEXERIL ) 10 MG tablet TAKE 1 TABLET BY MOUTH AT BEDTIME FOR NECK SPASMS 30 tablet 2   fluticasone  (FLONASE ) 50 MCG/ACT nasal spray SPRAY 1 SPRAY INTO BOTH NOSTRILS DAILY. 48 mL 1   furosemide  (LASIX ) 20 MG tablet Take 1 tablet (20 mg total) by mouth daily. 30 tablet 11   JARDIANCE  25 MG TABS tablet TAKE 1 TABLET BY MOUTH DAILY BEFORE BREAKFAST. 30 tablet 1   levothyroxine  (SYNTHROID ) 25 MCG tablet TAKE 1 TAB DAILY BEFORE BREAKFAST ON EMPTY STOMACH, WAIT 30 MINUTES AFTER TAKING MEDICATION TO EAT OR TAKE OTHER MEDICATIONS. 90 tablet 0   lisinopril  (ZESTRIL ) 20 MG tablet Take 1 tablet (20 mg total) by mouth daily. 30 tablet 11   methocarbamol  (ROBAXIN ) 500 MG tablet Take 1-2 tablets (500-1,000 mg total) by mouth 4 (four) times daily. (Patient taking differently: Take 500-1,000 mg by mouth as needed for muscle spasms.) 120 tablet 3   metoprolol  succinate (TOPROL -XL) 25 MG 24 hr tablet TAKE 1/2 TABLET BY MOUTH EVERY DAY AS INSTRUCTED  45 tablet 0   pantoprazole  (PROTONIX ) 20 MG tablet TAKE 1 TABLET BY MOUTH EVERY DAY 90 tablet 1   rosuvastatin  (CRESTOR ) 40 MG tablet TAKE 1 TABLET BY MOUTH EVERY DAY 90 tablet 1   spironolactone (ALDACTONE) 25 MG tablet Take 25 mg by mouth daily.     tadalafil  (CIALIS ) 20 MG tablet Take 1 tablet (20 mg total) by mouth daily as needed for erectile dysfunction. 30 tablet 3   No current facility-administered medications for this visit.    Past Medical History:  Diagnosis Date   Arthritis    back   CAD (coronary artery disease) 2017   CHF (congestive heart failure) (HCC)    High cholesterol    Hypertension    Myocardial infarction [facility]) 2017   Pollen allergy     Past Surgical History:  Procedure Laterality Date   CARDIAC CATHETERIZATION Right 07/24/2016   Procedure: Left Heart Cath and Coronary Angiography;  Surgeon: Denyse DELENA Bathe, MD;  Location: [facility] INVASIVE CV LAB;  Service: Cardiovascular;  Laterality: Right;   COLONOSCOPY WITH PROPOFOL  N/A 10/08/2023   Procedure: COLONOSCOPY WITH PROPOFOL ;  Surgeon: Unk Corinn Skiff, MD;  Location: [facility] ENDOSCOPY;  Service: Gastroenterology;  Laterality: N/A;   CORONARY ARTERY BYPASS GRAFT N/A 07/26/2016   Procedure: CORONARY ARTERY BYPASS GRAFTING (CABG), ON PUMP, TIMES FOUR, USING BILATERAL INTERNAL MAMMARY ARTERIES, RIGHT  GREATER SAPHENOUS VEIN HARVESTED ENDOSCOPICALLY;  Surgeon: Elspeth JAYSON Millers, MD;  Location: [facility] OR;  Service: Open Heart Surgery;  Laterality: N/A;   ENDARTERECTOMY Right 01/22/2020   Procedure: ENDARTERECTOMY CAROTID;  Surgeon: Marea Selinda RAMAN, MD;  Location: [facility] ORS;  Service: Vascular;  Laterality: Right;   INGUINAL HERNIA REPAIR Right 04/16/2020   Procedure: HERNIA REPAIR INGUINAL ADULT;  Surgeon: Marolyn Nest, MD;  Location: [facility] ORS;  Service: General;  Laterality: Right;   RADIAL ARTERY HARVEST Left 07/26/2016   Procedure: RADIAL LEFT ARTERY HARVEST;  Surgeon: Elspeth JAYSON Millers, MD;  Location: [facility] OR;  Service:  Open Heart Surgery;  Laterality: Left;   TEE WITHOUT CARDIOVERSION N/A 07/26/2016   Procedure: TRANSESOPHAGEAL ECHOCARDIOGRAM (TEE);  Surgeon: Elspeth JAYSON Millers, MD;  Location: [facility] OR;  Service: Open Heart Surgery;  Laterality: N/A;     Social History   Tobacco Use   Smoking status: Never    Passive exposure: Never   Smokeless tobacco: Never  Vaping Use   Vaping status: Never Used  Substance Use Topics   Alcohol use: No   Drug use: No      Family History  Problem Relation Age of Onset   Diabetes Mother    Heart attack Father    CAD Brother    Thyroid cancer Brother      No Known Allergies   REVIEW OF SYSTEMS (Negative unless checked)  Constitutional: [] Weight loss  [] Fever  [] Chills Cardiac: [] Chest pain   [] Chest pressure   [] Palpitations   [] Shortness of breath when laying flat   [] Shortness of breath at rest   [x] Shortness of breath with exertion. Vascular:  [x] Pain in legs with walking   [] Pain in legs at rest   [] Pain in legs when laying flat   [] Claudication   [] Pain in feet when walking  [] Pain in feet at rest  [] Pain in feet when laying flat   [] History of DVT   [] Phlebitis   [] Swelling in legs   [] Varicose veins   [] Non-healing ulcers Pulmonary:   [] Uses home oxygen   [] Productive cough   [] Hemoptysis   [] Wheeze  [] COPD   [] Asthma Neurologic:  [] Dizziness  [] Blackouts   [] Seizures   [] History of stroke   [] History of TIA  [] Aphasia   [] Temporary blindness   [] Dysphagia   [] Weakness or numbness in arms   [] Weakness or numbness in legs Musculoskeletal:  [] Arthritis   [] Joint swelling   [] Joint pain   [x] Low back pain Hematologic:  [] Easy bruising  [] Easy bleeding   [] Hypercoagulable state   [] Anemic   Gastrointestinal:  [] Blood in stool   [] Vomiting blood  [] Gastroesophageal reflux/heartburn   [] Abdominal pain Genitourinary:  [] Chronic kidney disease   [] Difficult urination  [] Frequent urination  [] Burning with urination   [] Hematuria Skin:  [] Rashes   [] Ulcers    [] Wounds Psychological:  [] History of anxiety   []  History of major depression.  Physical Examination  BP 123/83 (BP Location: Left Arm, Patient Position: Sitting, Cuff Size: Normal)   Pulse (!) 56   Resp 18   Ht 5' 6 (1.676 m)   Wt 183 lb (83 kg)   BMI 29.54 kg/m  Gen:  WD/WN, NAD Head: [place]/AT, No temporalis wasting. Ear/Nose/Throat: Hearing grossly intact, nares w/o erythema or drainage Eyes: Conjunctiva clear. Sclera non-icteric Neck: Supple.  Trachea midline Pulmonary:  Good air movement, no use of accessory muscles.  Cardiac: RRR, no JVD Vascular:  Vessel Right Left  Radial Palpable Palpable  Musculoskeletal: M/S 5/5 throughout.  No deformity or atrophy. No edema. Neurologic: Sensation grossly intact in extremities.  Symmetrical.  Speech is fluent.  Psychiatric: Judgment intact, Mood & affect appropriate for pt's clinical situation. Dermatologic: No rashes or ulcers noted.  No cellulitis or open wounds.      Labs Recent Results (from the past 2160 hours)  POCT HgB A1C     Status: Abnormal   Collection Time: 09/04/24 10:46 AM  Result Value Ref Range   Hemoglobin A1C 5.9 (A) 4.0 - 5.6 %   HbA1c POC (<> result, manual entry)     HbA1c, POC (prediabetic range)     HbA1c, POC (controlled diabetic range)      Radiology No results found.  Assessment/Plan  Carotid stenosis His carotid duplex today shows a widely patent right carotid endarterectomy and mild stenosis in the 1 to 39% range on the left side unchanged from previous studies.  Continue taking antiplatelets and statin agent.  Continue to follow on an annual basis.  Mixed hyperlipidemia lipid control important in reducing the progression of atherosclerotic disease. Continue statin therapy     Essential hypertension blood pressure control important in reducing the progression of atherosclerotic disease. On appropriate oral medications.  Selinda Gu, MD  09/16/2024 11:13 AM    This note was  created with Dragon medical transcription system.  Any errors from dictation are purely unintentional

## 2024-09-18 ENCOUNTER — Telehealth: Payer: Self-pay | Admitting: Physician Assistant

## 2024-09-18 NOTE — Telephone Encounter (Signed)
 Received $104.75 payment. 294 pages of MR mailed to [facility]; [address] [place], [place] [address] -Andree

## 2024-10-03 ENCOUNTER — Other Ambulatory Visit: Payer: Self-pay | Admitting: Cardiovascular Disease

## 2024-10-15 ENCOUNTER — Other Ambulatory Visit: Payer: Self-pay | Admitting: Cardiovascular Disease

## 2024-10-15 DIAGNOSIS — I2510 Atherosclerotic heart disease of native coronary artery without angina pectoris: Secondary | ICD-10-CM

## 2024-11-18 ENCOUNTER — Inpatient Hospital Stay

## 2024-11-18 ENCOUNTER — Encounter: Payer: Self-pay | Admitting: Internal Medicine

## 2024-11-18 ENCOUNTER — Inpatient Hospital Stay: Attending: Internal Medicine | Admitting: Internal Medicine

## 2024-11-18 VITALS — BP 120/86 | HR 50 | Temp 98.6°F | Resp 20 | Ht 66.0 in | Wt 184.4 lb

## 2024-11-18 DIAGNOSIS — R7303 Prediabetes: Secondary | ICD-10-CM | POA: Insufficient documentation

## 2024-11-18 DIAGNOSIS — M722 Plantar fascial fibromatosis: Secondary | ICD-10-CM | POA: Insufficient documentation

## 2024-11-18 DIAGNOSIS — F411 Generalized anxiety disorder: Secondary | ICD-10-CM | POA: Insufficient documentation

## 2024-11-18 DIAGNOSIS — G4730 Sleep apnea, unspecified: Secondary | ICD-10-CM | POA: Insufficient documentation

## 2024-11-18 DIAGNOSIS — H2513 Age-related nuclear cataract, bilateral: Secondary | ICD-10-CM | POA: Insufficient documentation

## 2024-11-18 DIAGNOSIS — D696 Thrombocytopenia, unspecified: Secondary | ICD-10-CM

## 2024-11-18 LAB — CMP (CANCER CENTER ONLY)
ALT: 19 U/L (ref 0–44)
AST: 27 U/L (ref 15–41)
Albumin: 4.2 g/dL (ref 3.5–5.0)
Alkaline Phosphatase: 76 U/L (ref 38–126)
Anion gap: 9 (ref 5–15)
BUN: 15 mg/dL (ref 8–23)
CO2: 28 mmol/L (ref 22–32)
Calcium: 9.3 mg/dL (ref 8.9–10.3)
Chloride: 102 mmol/L (ref 98–111)
Creatinine: 1.35 mg/dL — ABNORMAL HIGH (ref 0.61–1.24)
GFR, Estimated: 60 mL/min — ABNORMAL LOW
Glucose, Bld: 124 mg/dL — ABNORMAL HIGH (ref 70–99)
Potassium: 3.7 mmol/L (ref 3.5–5.1)
Sodium: 140 mmol/L (ref 135–145)
Total Bilirubin: 0.4 mg/dL (ref 0.0–1.2)
Total Protein: 7.3 g/dL (ref 6.5–8.1)

## 2024-11-18 LAB — CBC WITH DIFFERENTIAL (CANCER CENTER ONLY)
Abs Immature Granulocytes: 0.02 K/uL (ref 0.00–0.07)
Basophils Absolute: 0 K/uL (ref 0.0–0.1)
Basophils Relative: 0 %
Eosinophils Absolute: 0.2 K/uL (ref 0.0–0.5)
Eosinophils Relative: 3 %
HCT: 44.2 % (ref 39.0–52.0)
Hemoglobin: 14.3 g/dL (ref 13.0–17.0)
Immature Granulocytes: 0 %
Lymphocytes Relative: 58 %
Lymphs Abs: 3 K/uL (ref 0.7–4.0)
MCH: 30.2 pg (ref 26.0–34.0)
MCHC: 32.4 g/dL (ref 30.0–36.0)
MCV: 93.2 fL (ref 80.0–100.0)
Monocytes Absolute: 0.6 K/uL (ref 0.1–1.0)
Monocytes Relative: 12 %
Neutro Abs: 1.4 K/uL — ABNORMAL LOW (ref 1.7–7.7)
Neutrophils Relative %: 27 %
Platelet Count: 113 K/uL — ABNORMAL LOW (ref 150–400)
RBC: 4.74 MIL/uL (ref 4.22–5.81)
RDW: 13.2 % (ref 11.5–15.5)
WBC Count: 5.1 K/uL (ref 4.0–10.5)
nRBC: 0 % (ref 0.0–0.2)

## 2024-11-18 LAB — VITAMIN B12: Vitamin B-12: 698 pg/mL (ref 180–914)

## 2024-11-18 NOTE — Progress Notes (Signed)
 Patient has no concerns

## 2024-11-18 NOTE — Progress Notes (Signed)
"  Patient states   "

## 2024-11-18 NOTE — Progress Notes (Signed)
 [facility] CONSULT NOTE  Patient Care Team: Torres, Nicholas Torres as PCP - General (Physician Assistant) Rennie Cindy SAUNDERS, MD as Consulting Physician (Oncology)  CHIEF COMPLAINTS/PURPOSE OF CONSULTATION: Thrombocytopenia  HISTORY OF PRESENTING ILLNESS: Patient ambulating-independently Accompanied  with significant other  Nicholas Torres 64 y.o.  male with a history of CAD-on aspirin  and Plavix - with clinical ITP on surveillance is here for a follow up.  Discussed the use of AI scribe software for clinical note transcription with the patient, who gave verbal consent to proceed.  History of Present Illness   Nicholas Torres is a 64 year old male with chronic immune thrombocytopenic purpura who presents for hematology follow-up to monitor platelet counts and assess for bleeding symptoms.  He is under surveillance for ITP, with his last hematology evaluation approximately six months ago. He reports no new symptoms since the previous visit and specifically denies epistaxis, gingival bleeding, abnormal bleeding, or ecchymoses.  Platelet counts have remained stable, with the current value at 113 x10^9/L and prior values ranging from the 90s to 140s x10^9/L.  He continues aspirin , clopidogrel , and daily vitamin B12 supplementation, using both sublingual and oral administration. He does not consume alcohol or use tobacco. He reports no new cardiac symptoms or events since the last visit.       Review of Systems  Constitutional:  Negative for chills, diaphoresis, fever, malaise/fatigue and weight loss.  HENT:  Negative for nosebleeds and sore throat.   Eyes:  Negative for double vision.  Respiratory:  Negative for cough, hemoptysis, sputum production, shortness of breath and wheezing.   Cardiovascular:  Negative for chest pain, palpitations, orthopnea and leg swelling.  Gastrointestinal:  Negative for abdominal pain, blood in stool, constipation, diarrhea, heartburn,  melena, nausea and vomiting.  Genitourinary:  Negative for dysuria, frequency and urgency.  Musculoskeletal:  Negative for back pain and joint pain.  Skin: Negative.  Negative for itching and rash.  Neurological:  Negative for dizziness, tingling, focal weakness, weakness and headaches.  Endo/Heme/Allergies:  Does not bruise/bleed easily.  Psychiatric/Behavioral:  Negative for depression. The patient is not nervous/anxious and does not have insomnia.      MEDICAL HISTORY:  Past Medical History:  Diagnosis Date   Arthritis    back   CAD (coronary artery disease) 2017   CHF (congestive heart failure) (HCC)    High cholesterol    Hypertension    Myocardial infarction (HCC) 2017   Pollen allergy     SURGICAL HISTORY: Past Surgical History:  Procedure Laterality Date   CARDIAC CATHETERIZATION Right 07/24/2016   Procedure: Left Heart Cath and Coronary Angiography;  Surgeon: Denyse DELENA Bathe, MD;  Location: [facility] INVASIVE CV LAB;  Service: Cardiovascular;  Laterality: Right;   COLONOSCOPY WITH PROPOFOL  N/A 10/08/2023   Procedure: COLONOSCOPY WITH PROPOFOL ;  Surgeon: Unk Corinn Skiff, MD;  Location: [facility] ENDOSCOPY;  Service: Gastroenterology;  Laterality: N/A;   CORONARY ARTERY BYPASS GRAFT N/A 07/26/2016   Procedure: CORONARY ARTERY BYPASS GRAFTING (CABG), ON PUMP, TIMES FOUR, USING BILATERAL INTERNAL MAMMARY ARTERIES, RIGHT GREATER SAPHENOUS VEIN HARVESTED ENDOSCOPICALLY;  Surgeon: Elspeth JAYSON Millers, MD;  Location: [facility] OR;  Service: Open Heart Surgery;  Laterality: N/A;   ENDARTERECTOMY Right 01/22/2020   Procedure: ENDARTERECTOMY CAROTID;  Surgeon: Marea Selinda RAMAN, MD;  Location: [facility] ORS;  Service: Vascular;  Laterality: Right;   INGUINAL HERNIA REPAIR Right 04/16/2020   Procedure: HERNIA REPAIR INGUINAL ADULT;  Surgeon: Marolyn Nest, MD;  Location: [facility] ORS;  Service: General;  Laterality:  Right;   RADIAL ARTERY HARVEST Left 07/26/2016   Procedure: RADIAL LEFT ARTERY HARVEST;  Surgeon:  Elspeth JAYSON Millers, MD;  Location: [facility] OR;  Service: Open Heart Surgery;  Laterality: Left;   TEE WITHOUT CARDIOVERSION N/A 07/26/2016   Procedure: TRANSESOPHAGEAL ECHOCARDIOGRAM (TEE);  Surgeon: Elspeth JAYSON Millers, MD;  Location: [facility] OR;  Service: Open Heart Surgery;  Laterality: N/A;    SOCIAL HISTORY: Social History   Socioeconomic History   Marital status: Married    Spouse name: Not on file   Number of children: Not on file   Years of education: Not on file   Highest education level: Not on file  Occupational History   Not on file  Tobacco Use   Smoking status: Never    Passive exposure: Never   Smokeless tobacco: Never  Vaping Use   Vaping status: Never Used  Substance and Sexual Activity   Alcohol use: No   Drug use: No   Sexual activity: Yes  Other Topics Concern   Not on file  Social History Narrative   Not on file   Social Drivers of Health   Tobacco Use: Low Risk (11/18/2024)   Patient History    Smoking Tobacco Use: Never    Smokeless Tobacco Use: Never    Passive Exposure: Never  Financial Resource Strain: Low Risk (03/25/2024)   Received from [facility]   Overall Financial Resource Strain (CARDIA)    Difficulty of Paying Living Expenses: Not hard at all  Food Insecurity: Unknown (05/06/2024)   Epic    Worried About Programme Researcher, Broadcasting/film/video in the Last Year: Not on file    [employer] of Food in the Last Year: Never true  Transportation Needs: No Transportation Needs (05/06/2024)   Epic    Lack of Transportation (Medical): No    Lack of Transportation (Non-Medical): No  Physical Activity: Not on file  Stress: Not on file  Social Connections: Not on file  Intimate Partner Violence: Not At Risk (05/06/2024)   Epic    Fear of Current or Ex-Partner: No    Emotionally Abused: No    Physically Abused: No    Sexually Abused: No  Depression (PHQ2-9): Low Risk (09/04/2024)   Depression (PHQ2-9)    PHQ-2 Score: 0  Alcohol Screen: Low Risk (05/06/2024)   Alcohol  Screen    Last Alcohol Screening Score (AUDIT): 0  Housing: Unknown (09/03/2024)   Received from [facility]   Epic    Unable to Pay for Housing in the Last Year: Not on file    Number of Times Moved in the Last Year: Not on file    At any time in the past 12 months, were you homeless or living in a shelter (including now)?: No  Utilities: Not At Risk (05/06/2024)   Epic    Threatened with loss of utilities: No  Health Literacy: Not on file    FAMILY HISTORY: Family History  Problem Relation Age of Onset   Diabetes Mother    Heart attack Father    CAD Brother    Thyroid cancer Brother     ALLERGIES:  has no known allergies.  MEDICATIONS:  Current Outpatient Medications  Medication Sig Dispense Refill   ASPIRIN  LOW DOSE 81 MG tablet TAKE 1 TABLET BY MOUTH EVERY DAY 90 tablet 3   clopidogrel  (PLAVIX ) 75 MG tablet TAKE 1 TABLET BY MOUTH EVERY DAY 90 tablet 1   cyclobenzaprine  (FLEXERIL ) 10 MG tablet TAKE 1 TABLET  BY MOUTH AT BEDTIME FOR NECK SPASMS 30 tablet 2   fluticasone  (FLONASE ) 50 MCG/ACT nasal spray SPRAY 1 SPRAY INTO BOTH NOSTRILS DAILY. 48 mL 1   furosemide  (LASIX ) 20 MG tablet Take 1 tablet (20 mg total) by mouth daily. 30 tablet 11   JARDIANCE  25 MG TABS tablet TAKE 1 TABLET BY MOUTH DAILY BEFORE BREAKFAST. 30 tablet 1   levothyroxine  (SYNTHROID ) 25 MCG tablet TAKE 1 TAB DAILY BEFORE BREAKFAST ON EMPTY STOMACH, WAIT 30 MINUTES AFTER TAKING MEDICATION TO EAT OR TAKE OTHER MEDICATIONS. 90 tablet 0   lisinopril  (ZESTRIL ) 20 MG tablet Take 1 tablet (20 mg total) by mouth daily. 30 tablet 11   methocarbamol  (ROBAXIN ) 500 MG tablet Take 1-2 tablets (500-1,000 mg total) by mouth 4 (four) times daily. (Patient taking differently: Take 500-1,000 mg by mouth as needed for muscle spasms.) 120 tablet 3   metoprolol  succinate (TOPROL -XL) 25 MG 24 hr tablet TAKE 1/2 TABLET BY MOUTH EVERY DAY AS INSTRUCTED 45 tablet 0   pantoprazole  (PROTONIX ) 20 MG tablet TAKE 1  TABLET BY MOUTH EVERY DAY 90 tablet 1   rosuvastatin  (CRESTOR ) 40 MG tablet TAKE 1 TABLET BY MOUTH EVERY DAY 90 tablet 1   spironolactone (ALDACTONE) 25 MG tablet Take 25 mg by mouth daily.     tadalafil  (CIALIS ) 20 MG tablet Take 1 tablet (20 mg total) by mouth daily as needed for erectile dysfunction. 30 tablet 3   No current facility-administered medications for this visit.    PHYSICAL EXAMINATION:  Vitals:   11/18/24 0934  BP: 120/86  Pulse: (!) 50  Resp: 20  Temp: 98.6 F (37 C)  SpO2: 100%   Filed Weights   11/18/24 0934  Weight: 184 lb 6.4 oz (83.6 kg)    Physical Exam Vitals and nursing note reviewed.  HENT:     Head: Normocephalic and atraumatic.     Mouth/Throat:     Pharynx: Oropharynx is clear.  Eyes:     Extraocular Movements: Extraocular movements intact.     Pupils: Pupils are equal, round, and reactive to light.  Cardiovascular:     Rate and Rhythm: Normal rate and regular rhythm.  Pulmonary:     Comments: Decreased breath sounds bilaterally.  Abdominal:     Palpations: Abdomen is soft.  Musculoskeletal:        General: Normal range of motion.     Cervical back: Normal range of motion.  Skin:    General: Skin is warm.  Neurological:     General: No focal deficit present.     Mental Status: He is alert and oriented to person, place, and time.  Psychiatric:        Behavior: Behavior normal.        Judgment: Judgment normal.      LABORATORY DATA:  I have reviewed the data as listed Lab Results  Component Value Date   WBC 5.1 11/18/2024   HGB 14.3 11/18/2024   HCT 44.2 11/18/2024   MCV 93.2 11/18/2024   PLT 113 (L) 11/18/2024   Recent Labs    03/10/24 1032 04/16/24 1217 11/18/24 0928  NA 139 136 140  K 3.9 4.0 3.7  CL 100 101 102  CO2 29 27 28   GLUCOSE 92 87 124*  BUN 22 21* 15  CREATININE 1.39* 1.30* 1.35*  CALCIUM  9.8 9.1 9.3  GFRNONAA  --  >60 60*  PROT 7.6 7.7 7.3  ALBUMIN   --  4.1 4.2  AST 30 27 27  ALT 21 22 19    ALKPHOS  --  57 76  BILITOT 0.5 0.9 0.4    RADIOGRAPHIC STUDIES: I have personally reviewed the radiological images as listed and agreed with the findings in the report. No results found.  Thrombocytopenia Thrombocytopenia: Mild > 100 [2017]The etiology is LIKELY ITP  Clinically asymptomatic. immature platelet fraction;- HIV/hepatitis- WNL. HOLD off bone marrow biopsy.   # I discussed that  ITP is usually treated with steroids or rituxan infusion or TPO agents.  Hold off any treatment at this time as patient is fairly asymptomatic.  CT scan FEb 2021- no splen/liver megaly or cirrhosis.will hold off any therapies until platelets < 75 [as pt on asprin+plavix ]  # Mild neutropenia ANC 1.6-normal white count.  Patient is asymptomatic.  Likely benign ethnic neutropenia.  Monitor for now- stable.   # CAD- s/p CABG- [2017]; Dr.Khan - on asprin + plavix - stable.; no bleeding.   # [place] LOW B12 -B12 sublingual 1000 mcg once a day [under the tongue; helps with better absorption].  Awaiting  b12, if not improved the plan B12 injections.   # DISPOSITION: # follow up in 6 month- MD;  labs cbc/cmp; b12 levels-- Dr.B   All questions were answered. The patient knows to call the clinic with any problems, questions or concerns.     Hanh Kertesz R Feliciana Narayan, MD 11/18/2024 10:14 AM

## 2024-11-18 NOTE — Assessment & Plan Note (Signed)
 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 immature platelet fraction;- HIV/hepatitis- WNL. HOLD off bone marrow biopsy.   # I discussed that  ITP is usually treated with steroids or rituxan infusion or TPO agents.  Hold off any treatment at this time as patient is fairly asymptomatic.  CT scan FEb 2021- no splen/liver megaly or cirrhosis.will hold off any therapies until platelets < 75 [as pt on asprin+plavix ]  # Mild neutropenia ANC 1.6-normal white count.  Patient is asymptomatic.  Likely benign ethnic neutropenia.  Monitor for now- stable.   # CAD- s/p CABG- [2017]; Dr.Khan - on asprin + plavix - stable.; no bleeding.   # [place] LOW B12 -B12 sublingual 1000 mcg once a day [under the tongue; helps with better absorption].  Awaiting  b12, if not improved the plan B12 injections.   # DISPOSITION: # follow up in 6 month- MD;  labs cbc/cmp; b12 levels-- Dr.B

## 2024-11-21 ENCOUNTER — Other Ambulatory Visit: Payer: Self-pay | Admitting: Cardiovascular Disease

## 2024-11-21 DIAGNOSIS — I25118 Atherosclerotic heart disease of native coronary artery with other forms of angina pectoris: Secondary | ICD-10-CM

## 2024-11-25 ENCOUNTER — Ambulatory Visit: Admitting: Cardiovascular Disease

## 2024-11-25 ENCOUNTER — Encounter: Payer: Self-pay | Admitting: Cardiovascular Disease

## 2024-11-25 VITALS — BP 118/70 | HR 56 | Ht 66.0 in | Wt 185.0 lb

## 2024-11-25 DIAGNOSIS — I5033 Acute on chronic diastolic (congestive) heart failure: Secondary | ICD-10-CM | POA: Diagnosis not present

## 2024-11-25 DIAGNOSIS — Z951 Presence of aortocoronary bypass graft: Secondary | ICD-10-CM

## 2024-11-25 DIAGNOSIS — R0789 Other chest pain: Secondary | ICD-10-CM

## 2024-11-25 DIAGNOSIS — I214 Non-ST elevation (NSTEMI) myocardial infarction: Secondary | ICD-10-CM

## 2024-11-25 DIAGNOSIS — E782 Mixed hyperlipidemia: Secondary | ICD-10-CM

## 2024-11-25 DIAGNOSIS — I10 Essential (primary) hypertension: Secondary | ICD-10-CM

## 2024-11-25 DIAGNOSIS — G4733 Obstructive sleep apnea (adult) (pediatric): Secondary | ICD-10-CM

## 2024-11-25 DIAGNOSIS — I2583 Coronary atherosclerosis due to lipid rich plaque: Secondary | ICD-10-CM

## 2024-11-25 DIAGNOSIS — R0602 Shortness of breath: Secondary | ICD-10-CM | POA: Diagnosis not present

## 2024-11-25 DIAGNOSIS — I2510 Atherosclerotic heart disease of native coronary artery without angina pectoris: Secondary | ICD-10-CM | POA: Diagnosis not present

## 2024-11-25 DIAGNOSIS — I6521 Occlusion and stenosis of right carotid artery: Secondary | ICD-10-CM

## 2024-11-25 NOTE — Progress Notes (Signed)
 "     Cardiology Office Note   Date:  11/25/2024   ID:  Nicholas Torres, DOB 1961-04-18, MRN [id number]  PCP:  Kristina Tinnie POUR, PA-C  Cardiologist:  Denyse Bathe, MD      History of Present Illness: Nicholas Torres is a 64 y.o. male who presents for  Chief Complaint  Patient presents with   Follow-up    3 month follow up    No chest pain or SOB. Laying down gets SOB.      Past Medical History:  Diagnosis Date   Arthritis    back   CAD (coronary artery disease) 2017   CHF (congestive heart failure) (HCC)    High cholesterol    Hypertension    Myocardial infarction [facility]) 2017   Pollen allergy      Past Surgical History:  Procedure Laterality Date   CARDIAC CATHETERIZATION Right 07/24/2016   Procedure: Left Heart Cath and Coronary Angiography;  Surgeon: Denyse DELENA Bathe, MD;  Location: [facility] INVASIVE CV LAB;  Service: Cardiovascular;  Laterality: Right;   COLONOSCOPY WITH PROPOFOL  N/A 10/08/2023   Procedure: COLONOSCOPY WITH PROPOFOL ;  Surgeon: Unk Corinn Skiff, MD;  Location: [facility] ENDOSCOPY;  Service: Gastroenterology;  Laterality: N/A;   CORONARY ARTERY BYPASS GRAFT N/A 07/26/2016   Procedure: CORONARY ARTERY BYPASS GRAFTING (CABG), ON PUMP, TIMES FOUR, USING BILATERAL INTERNAL MAMMARY ARTERIES, RIGHT GREATER SAPHENOUS VEIN HARVESTED ENDOSCOPICALLY;  Surgeon: Elspeth JAYSON Millers, MD;  Location: [facility] OR;  Service: Open Heart Surgery;  Laterality: N/A;   ENDARTERECTOMY Right 01/22/2020   Procedure: ENDARTERECTOMY CAROTID;  Surgeon: Marea Selinda RAMAN, MD;  Location: [facility] ORS;  Service: Vascular;  Laterality: Right;   INGUINAL HERNIA REPAIR Right 04/16/2020   Procedure: HERNIA REPAIR INGUINAL ADULT;  Surgeon: Marolyn Nest, MD;  Location: [facility] ORS;  Service: General;  Laterality: Right;   RADIAL ARTERY HARVEST Left 07/26/2016   Procedure: RADIAL LEFT ARTERY HARVEST;  Surgeon: Elspeth JAYSON Millers, MD;  Location: [facility] OR;  Service: Open Heart Surgery;  Laterality: Left;   TEE  WITHOUT CARDIOVERSION N/A 07/26/2016   Procedure: TRANSESOPHAGEAL ECHOCARDIOGRAM (TEE);  Surgeon: Elspeth JAYSON Millers, MD;  Location: [facility] OR;  Service: Open Heart Surgery;  Laterality: N/A;     Current Outpatient Medications  Medication Sig Dispense Refill   ASPIRIN  LOW DOSE 81 MG tablet TAKE 1 TABLET BY MOUTH EVERY DAY 90 tablet 3   clopidogrel  (PLAVIX ) 75 MG tablet TAKE 1 TABLET BY MOUTH EVERY DAY 90 tablet 1   cyclobenzaprine  (FLEXERIL ) 10 MG tablet TAKE 1 TABLET BY MOUTH AT BEDTIME FOR NECK SPASMS 30 tablet 2   fluticasone  (FLONASE ) 50 MCG/ACT nasal spray SPRAY 1 SPRAY INTO BOTH NOSTRILS DAILY. 48 mL 1   furosemide  (LASIX ) 20 MG tablet Take 1 tablet (20 mg total) by mouth daily. 30 tablet 11   JARDIANCE  25 MG TABS tablet TAKE 1 TABLET BY MOUTH DAILY BEFORE BREAKFAST. 30 tablet 1   levothyroxine  (SYNTHROID ) 25 MCG tablet TAKE 1 TAB DAILY BEFORE BREAKFAST ON EMPTY STOMACH, WAIT 30 MINUTES AFTER TAKING MEDICATION TO EAT OR TAKE OTHER MEDICATIONS. 90 tablet 0   lisinopril  (ZESTRIL ) 20 MG tablet Take 1 tablet (20 mg total) by mouth daily. 30 tablet 11   methocarbamol  (ROBAXIN ) 500 MG tablet Take 1-2 tablets (500-1,000 mg total) by mouth 4 (four) times daily. (Patient taking differently: Take 500-1,000 mg by mouth as needed for muscle spasms.) 120 tablet 3   metoprolol  succinate (TOPROL -XL) 25 MG 24 hr tablet TAKE 1/2 TABLET  BY MOUTH EVERY DAY AS INSTRUCTED 45 tablet 0   pantoprazole  (PROTONIX ) 20 MG tablet TAKE 1 TABLET BY MOUTH EVERY DAY 90 tablet 1   rosuvastatin  (CRESTOR ) 40 MG tablet TAKE 1 TABLET BY MOUTH EVERY DAY 90 tablet 1   spironolactone (ALDACTONE) 25 MG tablet Take 25 mg by mouth daily.     tadalafil  (CIALIS ) 20 MG tablet Take 1 tablet (20 mg total) by mouth daily as needed for erectile dysfunction. 30 tablet 3   No current facility-administered medications for this visit.    Allergies:   Patient has no known allergies.    Social History:   reports that he has never smoked. He  has never been exposed to tobacco smoke. He has never used smokeless tobacco. He reports that he does not drink alcohol and does not use drugs.   Family History:  family history includes CAD in his brother; Diabetes in his mother; Heart attack in his father; Thyroid cancer in his brother.    ROS:     Review of Systems  Constitutional: Negative.   HENT: Negative.    Eyes: Negative.   Respiratory: Negative.    Gastrointestinal: Negative.   Genitourinary: Negative.   Musculoskeletal: Negative.   Skin: Negative.   Neurological: Negative.   Endo/Heme/Allergies: Negative.   Psychiatric/Behavioral: Negative.    All other systems reviewed and are negative.     All other systems are reviewed and negative.    PHYSICAL EXAM: VS:  BP 118/70   Pulse (!) 56   Ht 5' 6 (1.676 m)   Wt 185 lb (83.9 kg)   SpO2 99%   BMI 29.86 kg/m  , BMI Body mass index is 29.86 kg/m. Last weight:  Wt Readings from Last 3 Encounters:  11/25/24 185 lb (83.9 kg)  11/18/24 184 lb 6.4 oz (83.6 kg)  09/16/24 183 lb (83 kg)     Physical Exam Vitals reviewed.  Constitutional:      Appearance: Normal appearance. He is normal weight.  HENT:     Head: Normocephalic.     Nose: Nose normal.     Mouth/Throat:     Mouth: Mucous membranes are moist.  Eyes:     Pupils: Pupils are equal, round, and reactive to light.  Cardiovascular:     Rate and Rhythm: Normal rate and regular rhythm.     Pulses: Normal pulses.     Heart sounds: Normal heart sounds.  Pulmonary:     Effort: Pulmonary effort is normal.  Abdominal:     General: Abdomen is flat. Bowel sounds are normal.  Musculoskeletal:        General: Normal range of motion.     Cervical back: Normal range of motion.  Skin:    General: Skin is warm.  Neurological:     General: No focal deficit present.     Mental Status: He is alert.  Psychiatric:        Mood and Affect: Mood normal.       EKG:   Recent Labs: 11/18/2024: ALT 19; BUN 15;  Creatinine 1.35; Hemoglobin 14.3; Platelet Count 113; Potassium 3.7; Sodium 140    Lipid Panel    Component Value Date/Time   CHOL 174 03/10/2024 1032   TRIG 65 03/10/2024 1032   HDL 58 03/10/2024 1032   CHOLHDL 3.0 03/10/2024 1032   VLDL 17 07/24/2016 0612   LDLCALC 101 (H) 03/10/2024 1032      Other studies Reviewed: Additional studies/ records that were reviewed today  include:  Review of the above records demonstrates:       No data to display            ASSESSMENT AND PLAN:    ICD-10-CM   1. Other chest pain  R07.89 MYOCARDIAL PERFUSION IMAGING   set up stress test    2. NSTEMI (non-ST elevated myocardial infarction) (HCC)  I21.4 MYOCARDIAL PERFUSION IMAGING    3. S/P CABG x 4  Z95.1 MYOCARDIAL PERFUSION IMAGING    4. SOB (shortness of breath)  R06.02 MYOCARDIAL PERFUSION IMAGING    5. Essential hypertension  I10 MYOCARDIAL PERFUSION IMAGING    6. Coronary artery disease due to lipid rich plaque  I25.10 MYOCARDIAL PERFUSION IMAGING   I25.83     7. Mixed hyperlipidemia  E78.2 MYOCARDIAL PERFUSION IMAGING    8. CHF (congestive heart failure), NYHA class III, acute on chronic, diastolic (HCC)  I50.33 MYOCARDIAL PERFUSION IMAGING    9. Carotid stenosis, right  I65.21 MYOCARDIAL PERFUSION IMAGING    10. Obstructive sleep apnea syndrome  G47.33 MYOCARDIAL PERFUSION IMAGING   Get CPAP check up.       Problem List Items Addressed This Visit       Cardiovascular and Mediastinum   NSTEMI (non-ST elevated myocardial infarction) (HCC)   Relevant Orders   MYOCARDIAL PERFUSION IMAGING   CAD (coronary artery disease)   Relevant Orders   MYOCARDIAL PERFUSION IMAGING   Essential hypertension   Relevant Orders   MYOCARDIAL PERFUSION IMAGING   Carotid stenosis, right   Relevant Orders   MYOCARDIAL PERFUSION IMAGING     Respiratory   Sleep apnea, unspecified   Relevant Orders   MYOCARDIAL PERFUSION IMAGING     Other   S/P CABG x 4   Relevant Orders    MYOCARDIAL PERFUSION IMAGING   Mixed hyperlipidemia   Relevant Orders   MYOCARDIAL PERFUSION IMAGING   SOB (shortness of breath)   Relevant Orders   MYOCARDIAL PERFUSION IMAGING   Other Visit Diagnoses       Other chest pain    -  Primary   set up stress test   Relevant Orders   MYOCARDIAL PERFUSION IMAGING     CHF (congestive heart failure), NYHA class III, acute on chronic, diastolic (HCC)       Relevant Orders   MYOCARDIAL PERFUSION IMAGING          Disposition:   Return in about 5 weeks (around 12/30/2024) for Stress test and f/u.    Total time spent: 35 minutes  Signed,  Denyse Bathe, MD  11/25/2024 9:21 AM    [facility] "

## 2024-12-18 ENCOUNTER — Ambulatory Visit

## 2024-12-18 DIAGNOSIS — I214 Non-ST elevation (NSTEMI) myocardial infarction: Secondary | ICD-10-CM

## 2024-12-18 DIAGNOSIS — R0602 Shortness of breath: Secondary | ICD-10-CM

## 2024-12-18 DIAGNOSIS — Z951 Presence of aortocoronary bypass graft: Secondary | ICD-10-CM

## 2024-12-18 DIAGNOSIS — I10 Essential (primary) hypertension: Secondary | ICD-10-CM

## 2024-12-18 DIAGNOSIS — I6521 Occlusion and stenosis of right carotid artery: Secondary | ICD-10-CM

## 2024-12-18 DIAGNOSIS — G4733 Obstructive sleep apnea (adult) (pediatric): Secondary | ICD-10-CM

## 2024-12-18 DIAGNOSIS — R0789 Other chest pain: Secondary | ICD-10-CM

## 2024-12-18 DIAGNOSIS — E782 Mixed hyperlipidemia: Secondary | ICD-10-CM

## 2024-12-18 DIAGNOSIS — I5033 Acute on chronic diastolic (congestive) heart failure: Secondary | ICD-10-CM

## 2024-12-18 DIAGNOSIS — I2510 Atherosclerotic heart disease of native coronary artery without angina pectoris: Secondary | ICD-10-CM

## 2024-12-26 ENCOUNTER — Ambulatory Visit: Admitting: Cardiovascular Disease

## 2025-03-09 ENCOUNTER — Encounter: Admitting: Physician Assistant

## 2025-05-18 ENCOUNTER — Inpatient Hospital Stay

## 2025-05-18 ENCOUNTER — Inpatient Hospital Stay: Admitting: Internal Medicine

## 2025-09-15 ENCOUNTER — Ambulatory Visit (INDEPENDENT_AMBULATORY_CARE_PROVIDER_SITE_OTHER): Admitting: Vascular Surgery

## 2025-09-15 ENCOUNTER — Encounter (INDEPENDENT_AMBULATORY_CARE_PROVIDER_SITE_OTHER)
